# Patient Record
Sex: Male | Born: 1935 | Race: Black or African American | Hispanic: No | Marital: Married | Smoking: Never smoker
Health system: Southern US, Community
[De-identification: ages and names within clinical notes are randomized; demographics above are authoritative.]

## PROBLEM LIST (undated history)

## (undated) DIAGNOSIS — K635 Polyp of colon: Secondary | ICD-10-CM

## (undated) DIAGNOSIS — D649 Anemia, unspecified: Secondary | ICD-10-CM

## (undated) DIAGNOSIS — I471 Supraventricular tachycardia, unspecified: Secondary | ICD-10-CM

## (undated) DIAGNOSIS — R55 Syncope and collapse: Secondary | ICD-10-CM

## (undated) DIAGNOSIS — N2581 Secondary hyperparathyroidism of renal origin: Secondary | ICD-10-CM

## (undated) DIAGNOSIS — N289 Disorder of kidney and ureter, unspecified: Secondary | ICD-10-CM

## (undated) DIAGNOSIS — Z9889 Other specified postprocedural states: Secondary | ICD-10-CM

## (undated) DIAGNOSIS — I10 Essential (primary) hypertension: Secondary | ICD-10-CM

## (undated) DIAGNOSIS — Z8719 Personal history of other diseases of the digestive system: Secondary | ICD-10-CM

## (undated) DIAGNOSIS — R634 Abnormal weight loss: Secondary | ICD-10-CM

## (undated) DIAGNOSIS — R21 Rash and other nonspecific skin eruption: Secondary | ICD-10-CM

## (undated) DIAGNOSIS — M109 Gout, unspecified: Secondary | ICD-10-CM

## (undated) DIAGNOSIS — E7800 Pure hypercholesterolemia, unspecified: Secondary | ICD-10-CM

## (undated) DIAGNOSIS — R630 Anorexia: Secondary | ICD-10-CM

## (undated) HISTORY — DX: Rash and other nonspecific skin eruption: R21

## (undated) HISTORY — DX: Abnormal weight loss: R63.4

## (undated) HISTORY — DX: Anorexia: R63.0

## (undated) HISTORY — PX: OTHER SURGICAL HISTORY: SHX169

---

## 1997-07-31 ENCOUNTER — Ambulatory Visit (HOSPITAL_COMMUNITY): Admission: RE | Admit: 1997-07-31 | Discharge: 1997-07-31 | Payer: Self-pay | Admitting: Gastroenterology

## 1997-10-27 ENCOUNTER — Ambulatory Visit (HOSPITAL_COMMUNITY): Admission: RE | Admit: 1997-10-27 | Discharge: 1997-10-27 | Payer: Self-pay | Admitting: Family Medicine

## 1998-05-30 ENCOUNTER — Encounter: Admission: RE | Admit: 1998-05-30 | Discharge: 1998-08-28 | Payer: Self-pay | Admitting: Family Medicine

## 1998-08-23 ENCOUNTER — Encounter: Admission: RE | Admit: 1998-08-23 | Discharge: 1998-08-23 | Payer: Self-pay | Admitting: Family Medicine

## 1998-09-10 ENCOUNTER — Encounter: Admission: RE | Admit: 1998-09-10 | Discharge: 1998-09-10 | Payer: Self-pay | Admitting: Family Medicine

## 1998-11-12 ENCOUNTER — Encounter: Admission: RE | Admit: 1998-11-12 | Discharge: 1998-11-12 | Payer: Self-pay | Admitting: Family Medicine

## 1999-03-04 ENCOUNTER — Encounter: Admission: RE | Admit: 1999-03-04 | Discharge: 1999-03-04 | Payer: Self-pay | Admitting: Family Medicine

## 1999-05-27 ENCOUNTER — Encounter: Admission: RE | Admit: 1999-05-27 | Discharge: 1999-05-27 | Payer: Self-pay | Admitting: Family Medicine

## 1999-06-07 ENCOUNTER — Encounter: Admission: RE | Admit: 1999-06-07 | Discharge: 1999-06-07 | Payer: Self-pay | Admitting: *Deleted

## 1999-11-13 ENCOUNTER — Encounter: Admission: RE | Admit: 1999-11-13 | Discharge: 1999-11-13 | Payer: Self-pay | Admitting: Family Medicine

## 2000-07-14 ENCOUNTER — Encounter: Admission: RE | Admit: 2000-07-14 | Discharge: 2000-07-14 | Payer: Self-pay | Admitting: Family Medicine

## 2000-08-03 ENCOUNTER — Encounter: Admission: RE | Admit: 2000-08-03 | Discharge: 2000-08-03 | Payer: Self-pay | Admitting: Family Medicine

## 2000-08-04 ENCOUNTER — Encounter: Admission: RE | Admit: 2000-08-04 | Discharge: 2000-08-04 | Payer: Self-pay | Admitting: Sports Medicine

## 2000-09-02 ENCOUNTER — Encounter: Admission: RE | Admit: 2000-09-02 | Discharge: 2000-09-02 | Payer: Self-pay | Admitting: Family Medicine

## 2000-09-18 ENCOUNTER — Encounter: Admission: RE | Admit: 2000-09-18 | Discharge: 2000-09-18 | Payer: Self-pay | Admitting: Family Medicine

## 2000-11-13 ENCOUNTER — Encounter: Admission: RE | Admit: 2000-11-13 | Discharge: 2000-11-13 | Payer: Self-pay | Admitting: Family Medicine

## 2000-11-18 ENCOUNTER — Encounter: Admission: RE | Admit: 2000-11-18 | Discharge: 2000-11-18 | Payer: Self-pay | Admitting: Family Medicine

## 2001-01-28 ENCOUNTER — Encounter: Admission: RE | Admit: 2001-01-28 | Discharge: 2001-01-28 | Payer: Self-pay | Admitting: Family Medicine

## 2001-07-24 ENCOUNTER — Encounter: Payer: Self-pay | Admitting: Emergency Medicine

## 2001-07-24 ENCOUNTER — Emergency Department (HOSPITAL_COMMUNITY): Admission: EM | Admit: 2001-07-24 | Discharge: 2001-07-24 | Payer: Self-pay | Admitting: Emergency Medicine

## 2001-08-11 ENCOUNTER — Encounter: Admission: RE | Admit: 2001-08-11 | Discharge: 2001-08-11 | Payer: Self-pay | Admitting: Family Medicine

## 2001-10-28 ENCOUNTER — Encounter: Payer: Self-pay | Admitting: Vascular Surgery

## 2001-11-02 ENCOUNTER — Ambulatory Visit (HOSPITAL_COMMUNITY): Admission: RE | Admit: 2001-11-02 | Discharge: 2001-11-02 | Payer: Self-pay | Admitting: Vascular Surgery

## 2002-01-10 ENCOUNTER — Encounter: Admission: RE | Admit: 2002-01-10 | Discharge: 2002-01-10 | Payer: Self-pay | Admitting: Family Medicine

## 2002-04-15 ENCOUNTER — Encounter: Admission: RE | Admit: 2002-04-15 | Discharge: 2002-04-15 | Payer: Self-pay | Admitting: Family Medicine

## 2002-06-14 ENCOUNTER — Emergency Department (HOSPITAL_COMMUNITY): Admission: EM | Admit: 2002-06-14 | Discharge: 2002-06-14 | Payer: Self-pay

## 2002-06-28 ENCOUNTER — Encounter: Admission: RE | Admit: 2002-06-28 | Discharge: 2002-06-28 | Payer: Self-pay | Admitting: Family Medicine

## 2002-09-06 ENCOUNTER — Encounter: Admission: RE | Admit: 2002-09-06 | Discharge: 2002-09-06 | Payer: Self-pay | Admitting: Sports Medicine

## 2003-01-25 ENCOUNTER — Encounter: Admission: RE | Admit: 2003-01-25 | Discharge: 2003-01-25 | Payer: Self-pay | Admitting: Family Medicine

## 2003-06-22 ENCOUNTER — Encounter: Admission: RE | Admit: 2003-06-22 | Discharge: 2003-06-22 | Payer: Self-pay | Admitting: Family Medicine

## 2003-08-09 ENCOUNTER — Encounter: Admission: RE | Admit: 2003-08-09 | Discharge: 2003-08-09 | Payer: Self-pay | Admitting: Family Medicine

## 2003-08-10 ENCOUNTER — Encounter: Admission: RE | Admit: 2003-08-10 | Discharge: 2003-08-10 | Payer: Self-pay | Admitting: Family Medicine

## 2003-12-18 ENCOUNTER — Emergency Department (HOSPITAL_COMMUNITY): Admission: EM | Admit: 2003-12-18 | Discharge: 2003-12-18 | Payer: Self-pay | Admitting: Emergency Medicine

## 2003-12-21 ENCOUNTER — Ambulatory Visit: Payer: Self-pay | Admitting: Family Medicine

## 2004-02-16 ENCOUNTER — Ambulatory Visit: Payer: Self-pay | Admitting: Family Medicine

## 2004-05-14 ENCOUNTER — Ambulatory Visit: Payer: Self-pay | Admitting: Family Medicine

## 2004-09-17 ENCOUNTER — Ambulatory Visit: Payer: Self-pay | Admitting: Sports Medicine

## 2004-10-01 ENCOUNTER — Ambulatory Visit: Payer: Self-pay | Admitting: Sports Medicine

## 2005-01-10 ENCOUNTER — Ambulatory Visit: Payer: Self-pay | Admitting: Family Medicine

## 2005-06-11 ENCOUNTER — Ambulatory Visit: Payer: Self-pay | Admitting: Family Medicine

## 2005-06-27 ENCOUNTER — Ambulatory Visit: Payer: Self-pay | Admitting: Family Medicine

## 2005-08-29 ENCOUNTER — Emergency Department (HOSPITAL_COMMUNITY): Admission: EM | Admit: 2005-08-29 | Discharge: 2005-08-30 | Payer: Self-pay | Admitting: Emergency Medicine

## 2005-08-30 ENCOUNTER — Observation Stay (HOSPITAL_COMMUNITY): Admission: EM | Admit: 2005-08-30 | Discharge: 2005-08-31 | Payer: Self-pay | Admitting: Nephrology

## 2005-09-16 ENCOUNTER — Ambulatory Visit: Payer: Self-pay | Admitting: Sports Medicine

## 2006-02-02 ENCOUNTER — Encounter: Admission: RE | Admit: 2006-02-02 | Discharge: 2006-02-02 | Payer: Self-pay | Admitting: Nephrology

## 2006-02-11 ENCOUNTER — Ambulatory Visit (HOSPITAL_COMMUNITY): Admission: RE | Admit: 2006-02-11 | Discharge: 2006-02-11 | Payer: Self-pay | Admitting: Nephrology

## 2006-04-29 ENCOUNTER — Emergency Department (HOSPITAL_COMMUNITY): Admission: EM | Admit: 2006-04-29 | Discharge: 2006-04-29 | Payer: Self-pay | Admitting: Emergency Medicine

## 2006-06-04 DIAGNOSIS — M109 Gout, unspecified: Secondary | ICD-10-CM

## 2006-06-04 DIAGNOSIS — I471 Supraventricular tachycardia: Secondary | ICD-10-CM

## 2006-06-04 DIAGNOSIS — D126 Benign neoplasm of colon, unspecified: Secondary | ICD-10-CM

## 2006-06-04 DIAGNOSIS — I209 Angina pectoris, unspecified: Secondary | ICD-10-CM

## 2006-06-04 DIAGNOSIS — I10 Essential (primary) hypertension: Secondary | ICD-10-CM | POA: Insufficient documentation

## 2006-06-04 DIAGNOSIS — E7800 Pure hypercholesterolemia, unspecified: Secondary | ICD-10-CM | POA: Insufficient documentation

## 2006-06-05 ENCOUNTER — Emergency Department (HOSPITAL_COMMUNITY): Admission: EM | Admit: 2006-06-05 | Discharge: 2006-06-05 | Payer: Self-pay | Admitting: Emergency Medicine

## 2006-08-24 ENCOUNTER — Ambulatory Visit (HOSPITAL_COMMUNITY): Admission: RE | Admit: 2006-08-24 | Discharge: 2006-08-24 | Payer: Self-pay | Admitting: Nephrology

## 2006-08-26 ENCOUNTER — Ambulatory Visit (HOSPITAL_COMMUNITY): Admission: RE | Admit: 2006-08-26 | Discharge: 2006-08-26 | Payer: Self-pay | Admitting: Vascular Surgery

## 2006-08-26 ENCOUNTER — Ambulatory Visit: Payer: Self-pay | Admitting: Vascular Surgery

## 2006-08-31 ENCOUNTER — Ambulatory Visit (HOSPITAL_COMMUNITY): Admission: EM | Admit: 2006-08-31 | Discharge: 2006-08-31 | Payer: Self-pay | Admitting: Emergency Medicine

## 2006-09-10 ENCOUNTER — Ambulatory Visit (HOSPITAL_COMMUNITY): Admission: RE | Admit: 2006-09-10 | Discharge: 2006-09-10 | Payer: Self-pay | Admitting: Nephrology

## 2007-02-01 ENCOUNTER — Ambulatory Visit (HOSPITAL_COMMUNITY): Admission: RE | Admit: 2007-02-01 | Discharge: 2007-02-01 | Payer: Self-pay | Admitting: Vascular Surgery

## 2007-02-01 ENCOUNTER — Emergency Department (HOSPITAL_COMMUNITY): Admission: EM | Admit: 2007-02-01 | Discharge: 2007-02-02 | Payer: Self-pay | Admitting: Emergency Medicine

## 2007-02-01 ENCOUNTER — Ambulatory Visit: Payer: Self-pay | Admitting: Vascular Surgery

## 2007-06-03 ENCOUNTER — Ambulatory Visit: Payer: Self-pay | Admitting: Family Medicine

## 2007-06-12 ENCOUNTER — Emergency Department (HOSPITAL_COMMUNITY): Admission: EM | Admit: 2007-06-12 | Discharge: 2007-06-12 | Payer: Self-pay | Admitting: Emergency Medicine

## 2007-07-06 ENCOUNTER — Ambulatory Visit: Payer: Self-pay | Admitting: Vascular Surgery

## 2007-09-15 ENCOUNTER — Ambulatory Visit (HOSPITAL_COMMUNITY): Admission: RE | Admit: 2007-09-15 | Discharge: 2007-09-15 | Payer: Self-pay | Admitting: Nephrology

## 2007-10-28 ENCOUNTER — Encounter: Admission: RE | Admit: 2007-10-28 | Discharge: 2007-10-28 | Payer: Self-pay | Admitting: Nephrology

## 2007-11-04 ENCOUNTER — Ambulatory Visit: Payer: Self-pay | Admitting: Family Medicine

## 2007-11-18 ENCOUNTER — Ambulatory Visit: Payer: Self-pay | Admitting: Family Medicine

## 2007-12-02 ENCOUNTER — Encounter: Payer: Self-pay | Admitting: Family Medicine

## 2007-12-02 ENCOUNTER — Ambulatory Visit: Payer: Self-pay | Admitting: Family Medicine

## 2007-12-02 DIAGNOSIS — J449 Chronic obstructive pulmonary disease, unspecified: Secondary | ICD-10-CM

## 2007-12-02 DIAGNOSIS — J4489 Other specified chronic obstructive pulmonary disease: Secondary | ICD-10-CM | POA: Insufficient documentation

## 2007-12-02 LAB — CONVERTED CEMR LAB
ALT: 23 units/L (ref 0–53)
CO2: 28 meq/L (ref 19–32)
Creatinine, Ser: 9.93 mg/dL — ABNORMAL HIGH (ref 0.40–1.50)
Direct LDL: 96 mg/dL
Glucose, Bld: 91 mg/dL (ref 70–99)
LDL Cholesterol: 96 mg/dL
Total Bilirubin: 0.3 mg/dL (ref 0.3–1.2)

## 2007-12-06 ENCOUNTER — Encounter: Payer: Self-pay | Admitting: Family Medicine

## 2007-12-16 ENCOUNTER — Ambulatory Visit: Payer: Self-pay | Admitting: Family Medicine

## 2007-12-16 DIAGNOSIS — G547 Phantom limb syndrome without pain: Secondary | ICD-10-CM | POA: Insufficient documentation

## 2008-02-25 ENCOUNTER — Ambulatory Visit (HOSPITAL_COMMUNITY): Admission: RE | Admit: 2008-02-25 | Discharge: 2008-02-25 | Payer: Self-pay | Admitting: Nephrology

## 2008-09-28 ENCOUNTER — Ambulatory Visit (HOSPITAL_COMMUNITY): Admission: RE | Admit: 2008-09-28 | Discharge: 2008-09-28 | Payer: Self-pay | Admitting: Nephrology

## 2009-02-27 ENCOUNTER — Ambulatory Visit (HOSPITAL_COMMUNITY): Admission: RE | Admit: 2009-02-27 | Discharge: 2009-02-27 | Payer: Self-pay | Admitting: Nephrology

## 2009-05-23 ENCOUNTER — Telehealth: Payer: Self-pay | Admitting: Family Medicine

## 2009-07-26 ENCOUNTER — Ambulatory Visit (HOSPITAL_COMMUNITY): Admission: RE | Admit: 2009-07-26 | Discharge: 2009-07-26 | Payer: Self-pay | Admitting: Nephrology

## 2009-10-23 ENCOUNTER — Ambulatory Visit (HOSPITAL_COMMUNITY): Admission: RE | Admit: 2009-10-23 | Discharge: 2009-10-23 | Payer: Self-pay | Admitting: Nephrology

## 2009-10-26 ENCOUNTER — Emergency Department (HOSPITAL_COMMUNITY): Admission: EM | Admit: 2009-10-26 | Discharge: 2009-10-26 | Payer: Self-pay | Admitting: Emergency Medicine

## 2009-11-01 ENCOUNTER — Ambulatory Visit: Payer: Self-pay | Admitting: Family Medicine

## 2009-12-04 ENCOUNTER — Encounter: Payer: Self-pay | Admitting: Family Medicine

## 2010-01-14 ENCOUNTER — Encounter: Payer: Self-pay | Admitting: Family Medicine

## 2010-02-02 ENCOUNTER — Emergency Department (HOSPITAL_COMMUNITY): Admission: EM | Admit: 2010-02-02 | Discharge: 2010-02-02 | Payer: Self-pay | Admitting: Emergency Medicine

## 2010-02-05 ENCOUNTER — Encounter: Payer: Self-pay | Admitting: Family Medicine

## 2010-02-05 ENCOUNTER — Ambulatory Visit: Payer: Self-pay | Admitting: Family Medicine

## 2010-02-05 ENCOUNTER — Encounter: Payer: Self-pay | Admitting: *Deleted

## 2010-03-08 ENCOUNTER — Inpatient Hospital Stay (HOSPITAL_COMMUNITY)
Admission: EM | Admit: 2010-03-08 | Discharge: 2010-03-09 | Payer: Self-pay | Source: Home / Self Care | Admitting: Emergency Medicine

## 2010-03-09 ENCOUNTER — Encounter: Payer: Self-pay | Admitting: Family Medicine

## 2010-03-12 ENCOUNTER — Ambulatory Visit: Payer: Self-pay | Admitting: Family Medicine

## 2010-03-12 DIAGNOSIS — K222 Esophageal obstruction: Secondary | ICD-10-CM | POA: Insufficient documentation

## 2010-03-20 ENCOUNTER — Telehealth: Payer: Self-pay | Admitting: Family Medicine

## 2010-03-26 ENCOUNTER — Ambulatory Visit (HOSPITAL_COMMUNITY)
Admission: RE | Admit: 2010-03-26 | Discharge: 2010-03-26 | Payer: Self-pay | Source: Home / Self Care | Attending: Nephrology | Admitting: Nephrology

## 2010-03-28 ENCOUNTER — Encounter
Admission: RE | Admit: 2010-03-28 | Discharge: 2010-03-28 | Payer: Self-pay | Source: Home / Self Care | Attending: Gastroenterology | Admitting: Gastroenterology

## 2010-04-28 ENCOUNTER — Encounter: Payer: Self-pay | Admitting: Nephrology

## 2010-04-28 ENCOUNTER — Encounter: Payer: Self-pay | Admitting: Gastroenterology

## 2010-05-07 NOTE — Assessment & Plan Note (Signed)
Summary: f/u ED visit/eo   Vital Signs:  Patient profile:   75 year old male Weight:      137.4 pounds Temp:     98 degrees F oral Pulse rate:   73 / minute BP sitting:   204 / 104  Vitals Entered By: Arlyss Repress CMA, (March 12, 2010 10:50 AM) CC: HFU. HTN and throat pain Is Patient Diabetic? No Pain Assessment Patient in pain? no        Primary Care Provider:  Beza Steppe MD  CC:  HFU. HTN and throat pain.  History of Present Illness: 75 y/o M with ESRD, gout, HLD here for hosp f/u of elevated BP, chest pain and difficulty swallowing.  Pt was admitted from 03/22/2010 - 03/10/10  1)Chest pain: with T wave inversion on EKG.  Pt ruled out with 3 sets of cardiac enzymes and f/u EKG that returned to baseline.  T wave inversion was thought to be secondary to Hyperkalemia as pt's K was 5.6 on arrival to ER.  He was given kayexylate in ER.  Currently no chest pain, diaphoresis, dyspnea, or palpitations.    2)Elevated BP at arrival to ER, which responded to Labetalol.  BP was well controlled during hospitalization with home meds of Norvasc 10mg  qhs and Metoprolol xl 100mg  at bedtime, ranging from 160s-170s.  He continues to have worsened HTN today with BP 204/104.  He denies chest pain, syncope, dyspnea, diphoresis, nausea/vomting.   3)Dysphagia:  pt endorsed painful swallowing x several days prior to admission. He states dysphagia did not compromise breathing or swallowing, although he had pain with swallowing.  He has history of esophageal stricture s/p dilation in 1999.  Currently pt still endorses some painful swallowing.  He has been able to eat and drink.    4)ESRD: on Hemodialysis Mon, Wed, Fri at [facility] facility.  Although he was hyperkalemic to 5.6 on admission to ER on 03-23-23, Potassium returned to wnl after kayexylate.  Discharge K was 4.4.  Pt states that he tolerated HD well yesterday.  He states that Dr Deterding decreased his dry weight, but he does not know why.  He also states  that the potassium was adjusted during dialysis yesterday.      Habits & Providers  Alcohol-Tobacco-Diet     Tobacco Status: never  Current Medications (verified): 1)  Lipitor 40 Mg Tabs (Atorvastatin Calcium) .... 1/2 Tab By Mouth At Bedtime 2)  Colchicine 0.6 Mg Tabs (Colchicine) .Marland Kitchen.. 1 Tab By Mouth Daily 3)  Toprol Xl 100 Mg Xr24h-Tab (Metoprolol Succinate) .Marland Kitchen.. 1 Tab By Mouth Daily 4)  Amlodipine Besylate 10 Mg Tabs (Amlodipine Besylate) .Marland Kitchen.. 1 Tab By Mouth At Bedtime For Blood Pressure 5)  Dialyvite   Tabs (B Complex-C-Folic Acid) .Marland Kitchen.. 1 Tab By Mouth Daily 6)  Calcium Acetate 667 Mg Caps (Calcium Acetate (Phos Binder)) .Marland Kitchen.. 1 Cap Three Times A Day With Meals and 1 Cap With Snack 7)  Aspir-Low 81 Mg Tbec (Aspirin) .Marland Kitchen.. 1 Tab Daily  Allergies (verified): No Known Drug Allergies  Past History:  Past Surgical History: Last updated: 11/01/2009 AV fistula, L forearm - 03/24/2002 Colonoscopy - 07/06/1997 EGD - esophageal ring dilated - 07/06/1997 R arm amputation after machine accident at work-11/1995  Family History: Last updated: Mar 22, 2010 Dad died from heart dz., Mom alive w/ HTN.  Social History: Last updated: 02/05/2010 Lives with wife.  Has 4 children, 4 grandchildren.  One child lives with him and wife. On disability for R arm.  Previuosly heavy vodka drinker.  No tobacco or drugs.  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 Vitals reviewed.   Lungs:  CTA b/l  Heart:  Normal rate and regular rhythm. S1 and S2 normal without gallop, murmur, Extremities:  no edema    Neurologic:  alert & oriented X3.     Impression & Recommendations:  Problem # 1:  ESOPHAGEAL STRICTURE (ICD-530.3) Assessment Deteriorated Pt with history of esophageal stricture s/p esophageal dilation in 1999.  I would like to refer pt to GI for potential scoping.  He has an appt with Dr Matthias Hughs today at 4:15.    Orders: Gastroenterology Referral (GI) [facility]- Est Level  3 (95638)  Problem # 2:  HYPERTENSION, BENIGN SYSTEMIC (ICD-401.1) Assessment: Deteriorated BP 204/104 is much elevated and not at goal.  Pt is compliant with his medications.  He states that it was in the 140s in HD yesterday.  Because of his ESRD status, I would like to discuss hypertension management with pt's Nephrologist, Dr Deterding before making adjustments to his medications.  I spoke with Dr Deterding's nurse, Bonita Quin, regarding pt's current BP.  Bonita Quin advised that pt is scheduled to have HD tomorrow and Dr Deterding will round on pt tomorrow.  Will fax today's office visit note to Dr Deterding.  Discussed with pt that I will not make changes to meds until I discuss matter over with Dr Deterding first.  Pt is amendable to this plan.  Pt to continue on current meds for now.    His updated medication list for this problem includes:    Toprol Xl 100 Mg Xr24h-tab (Metoprolol succinate) .Marland Kitchen... 1 tab by mouth daily    Amlodipine Besylate 10 Mg Tabs (Amlodipine besylate) .Marland Kitchen... 1 tab by mouth at bedtime for blood pressure  Orders: [facility]- Est Level  3 ([id number])  Complete Medication List: 1)  Lipitor 40 Mg Tabs (Atorvastatin calcium) .... 1/2 tab by mouth at bedtime 2)  Colchicine 0.6 Mg Tabs (Colchicine) .Marland Kitchen.. 1 tab by mouth daily 3)  Toprol Xl 100 Mg Xr24h-tab (Metoprolol succinate) .Marland Kitchen.. 1 tab by mouth daily 4)  Amlodipine Besylate 10 Mg Tabs (Amlodipine besylate) .Marland Kitchen.. 1 tab by mouth at bedtime for blood pressure 5)  Dialyvite Tabs (B complex-c-folic acid) .Marland Kitchen.. 1 tab by mouth daily 6)  Calcium Acetate 667 Mg Caps (Calcium  acetate (phos binder)) .Marland Kitchen.. 1 cap three times a day with meals and 1 cap with snack 7)  Aspir-low 81 Mg Tbec (Aspirin) .Marland Kitchen.. 1 tab daily   Orders Added: 1)  Gastroenterology Referral [GI] 2)  [facility]- Est Level  3 [[id number]]

## 2010-05-07 NOTE — Miscellaneous (Signed)
Summary: ROI/TS  FAXED ROI TO DR.BUCCINI AND DR.DETERDING. Arlyss Repress CMA,  February 05, 2010 2:07 PM

## 2010-05-07 NOTE — Miscellaneous (Signed)
Summary: ROI  ROI   Imported By: De Nurse 02/13/2010 14:47:09  _____________________________________________________________________  External Attachment:    Type:   Image     Comment:   External Document

## 2010-05-07 NOTE — Assessment & Plan Note (Signed)
Summary: f/up,tcb   Vital Signs:  Patient profile:   75 year old male Height:      69 inches Weight:      139 pounds BMI:     20.60 Temp:     97.5 degrees F oral Pulse rate:   88 / minute BP sitting:   130 / 80  (right arm) Cuff size:   large  Vitals Entered By: Jimmy Footman, CMA (November 01, 2009 1:45 PM) CC: ER f/u confusion Is Patient Diabetic? Yes   Primary Care Provider:  Razan Siler MD  CC:  ER f/u confusion.  History of Present Illness: This is my first time meeting the patient and pt has not been seen at [facility] since 2009  75 y/o M is here for ER f/u of alter mental status.  Pt has ESRD on HD MWF.  Pt was seen in ER on 7/22 because wife was concerned that he was not acting like himself.  In the ER he had CXR, Head CT, CBC, Cmet, PT/INR that were all wnl.  He was sent home from ED.    Per pt and wife he was not able to get HD on Mon 7/18 because his fistula was clotted.  He was taken to OR on 7/19 and given general anesthesia for declotting of fistula.  After he was released from the OR pt retruned to dialysis center on tues for HD.  He then returned on Wed and Fri for HD.  Per pt's wife, on Tues following HD pt was not himself.  Pt did not know if he had gotten HD.  Asked wife when he was to go to HD next.  Wife states that pt's mental status returned to normal by Tues evening.    Pt and wife seems very unclear/unsure of the events that transpired that prompted the ER visit.  They state that pt was told to go to ER by Dialysis nurse.  Pt has since returned to all dialysis appts without incidents.  Pt currently feels fine.  Because I was unsure of the history of events I called the Dialysis Center on [address] and spoke to nurse there.  The charge nurse was not present during my call, but after speaking with the techs and other nurses there, it seems pt was prompted to go to ER on Fri because pt's wife called the HD Center on Wed with concerns that he acted strangely on Tues.  According to  staff at dialysis center they did not notice anything concerning or strange in pt's behavior when he was there for HD on T,W,F.      Habits & Providers  Alcohol-Tobacco-Diet     Tobacco Status: never  Current Medications (verified): 1)  Lipitor 80 Mg Tabs (Atorvastatin Calcium) .... 1/2 Tab By Mouth Daily 2)  Colchicine 0.6 Mg Tabs (Colchicine) .Marland Kitchen.. 1 Tab By Mouth Daily 3)  Toprol Xl 100 Mg Xr24h-Tab (Metoprolol Succinate) .Marland Kitchen.. 1 Tab By Mouth Daily 4)  Amlodipine Besylate 10 Mg Tabs (Amlodipine Besylate) .Marland Kitchen.. 1 Tab By Mouth At Bedtime For Blood Pressure 5)  Dialyvite   Tabs (B Complex-C-Folic Acid) .Marland Kitchen.. 1 Tab By Mouth Daily 6)  Calcium Acetate 667 Mg Caps (Calcium Acetate (Phos Binder)) .Marland Kitchen.. 1 Cap Three Times A Day With Meals and 1 Cap With Snack 7)  Aspir-Low 81 Mg Tbec (Aspirin) .Marland Kitchen.. 1 Tab Daily 8)  Lipitor 40 Mg Tabs (Atorvastatin Calcium) .Marland Kitchen.. 1 Tab By Mouth Daily  Allergies (verified): No Known Drug Allergies  Past History:  Family History: Last updated: 06-15-2006 Dad died from heart dz., Mom alive w/ HTN.  Social History: Last updated: 11/01/2009 Lives with wife.  Has 4 children, 4 grandchildren.  One child lives with him and wife. On disability for R arm.  Previuosly heavy vodka drinker.  No tobacco or drugs.  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  Has 4 children, 4 grandchildren.  One child lives with him and wife. On disability for R arm.  Previuosly heavy vodka drinker.  No tobacco or drugs.  Review of Systems General:  Denies chills, fatigue, fever, and loss of appetite. CV:   Denies chest pain or discomfort, difficulty breathing at night, fainting, fatigue, lightheadness, and near fainting. Resp:  Denies chest discomfort, chest pain with inspiration, and cough. GI:  Denies abdominal pain, constipation, diarrhea, nausea, and vomiting. Neuro:  Denies difficulty with concentration, disturbances in coordination, falling down, headaches, inability to speak, memory loss, numbness, tingling, tremors, and weakness.  Physical Exam  General:  Well-developed,well-nourished,in no acute distress; alert,appropriate and cooperative throughout examination. vitals reviewed.  Neck:  No deformities, masses, or tenderness noted. Lungs:  Normal respiratory effort, chest expands symmetrically. Lungs are clear to auscultation, no crackles or wheezes. Heart:  Normal rate and regular rhythm. S1 and S2 normal without gallop, murmur, click, rub or other extra sounds. Abdomen:  Bowel sounds positive,abdomen soft and non-tender without masses, organomegaly or hernias noted. Neurologic:  No cranial nerve deficits noted. Station and gait are normal. Plantar reflexes are down-going bilaterally. DTRs are symmetrical throughout. Sensory, motor and coordinative functions appear intact.alert & oriented X3.   Cervical Nodes:  No lymphadenopathy noted   Impression & Recommendations:  Problem # 1:  ALTERED MENTAL STATUS (ICD-780.97) Assessment Improved ER f/u f/u for AMS.  Neg CT head, CXR, CBC, Bmet.  Pt was not uremic, acidotic.  Currently neural exam is nonfocal.  I think that his symptoms may have been 2/2 coming off of anesthesia.  He has current labs at the kidney center, which I have requested.  Pt to f/u with me in 2-3 days. Will not make changes to his medications at this time.     Orders: [facility]- Est Level  3 ([id number])  Problem # 2:  COLON POLYP (ICD-211.3) Assessment: Comment Only Colon polyp seen on colonscopy in 2007.  Pt has not had repeat c-scope.  He needs to f/u with Dr Matthias Hughs.     Complete Medication List: 1)  Lipitor 80 Mg Tabs (Atorvastatin calcium) .... 1/2 tab by mouth daily 2)  Colchicine 0.6 Mg Tabs (Colchicine) .Marland Kitchen.. 1 tab by mouth daily 3)  Toprol Xl 100 Mg Xr24h-tab (Metoprolol succinate) .Marland Kitchen.. 1 tab by mouth daily 4)  Amlodipine Besylate 10 Mg Tabs (Amlodipine besylate) .Marland Kitchen.. 1 tab by mouth at bedtime for blood pressure 5)  Dialyvite Tabs (B complex-c-folic acid) .Marland Kitchen.. 1 tab by mouth daily 6)  Calcium Acetate 667 Mg Caps (Calcium acetate (phos binder)) .Marland Kitchen.. 1 cap three times a day with meals and 1 cap with snack 7)  Aspir-low 81 Mg Tbec (Aspirin) .Marland Kitchen.. 1 tab daily 8)  Lipitor 40 Mg Tabs (Atorvastatin calcium) .Marland Kitchen.. 1 tab by mouth daily  Other Orders: Future Orders: Lipid-[facility] ([id number]) ... 10/10/2011 Comp Met-[facility] ([id number]) ... 10/17/2010 CBC-[facility] ([id number]) ... 10/24/2010  Patient Instructions: 1)  Please schedule a follow-up appointment in 2-3 weeks with Dr Janalyn Harder.  2)  It was nice meeting you.  I will call you if there is anything concerning in the labs I see from Dr Darrick Penna. 3)  Schedule a colonoscopy/ sigmoidoscopy to help detect colon cancer with Dr Matthias Hughs:  [phone].  Appended Document: f/up,tcb Office visit of 11/01/2009 faxed to [facility]. Starleen Blue RN 11/02/2009

## 2010-05-07 NOTE — Assessment & Plan Note (Signed)
Summary: f/u ed,df   Vital Signs:  Patient profile:   75 year old male Weight:      139.1 pounds Pulse rate:   72 / minute BP sitting:   207 / 105 Cuff size:   large  Vitals Entered By: Arlyss Repress CMA, (February 05, 2010 10:06 AM) CC: f/up ED. BP left leg 237/121 regular cuff. re-checked with large cuff BP 207/105 Is Patient Diabetic? No Pain Assessment Patient in pain? no        Primary Care Provider:  Cat Ta MD  CC:  f/up ED. BP left leg 237/121 regular cuff. re-checked with large cuff BP 207/105.  History of Present Illness: 75 y/o M with ESRD on HD MWF is here for ED f/u of R knee pain.   R knee pain: Was seen in ER on 10/29 for acute knee pain.  Xrays done were negative.  Pt was given Rx for hydrocodone/acetaminophen.  He states pain is much better.  He is not taking anymore pain meds.  Denies falling, pain, swelling, locking, fever, chills, pain in other joints.   HYPERTENSION Disease Monitoring Blood pressure range: at HD center it usually runs 110s/60s, checked on left thigh  Medications: norvasc 10, metoprolol 100mg   Compliance: Lightheadedness: no Edema:no  Chest pain: no  Dyspnea:no  Prevention Exercise: none  Salt restriction:yes    Habits & Providers  Alcohol-Tobacco-Diet     Tobacco Status: never  Current Medications (verified): 1)  Lipitor 40 Mg Tabs (Atorvastatin Calcium) .... 1/2 Tab By Mouth At Bedtime 2)  Colchicine 0.6 Mg Tabs (Colchicine) .Marland Kitchen.. 1 Tab By Mouth Daily 3)  Toprol Xl 100 Mg Xr24h-Tab (Metoprolol Succinate) .Marland Kitchen.. 1 Tab By Mouth Daily 4)  Amlodipine Besylate 10 Mg Tabs (Amlodipine Besylate) .Marland Kitchen.. 1 Tab By Mouth At Bedtime For Blood Pressure 5)  Dialyvite   Tabs (B Complex-C-Folic Acid) .Marland Kitchen.. 1 Tab By Mouth Daily 6)  Calcium Acetate 667 Mg Caps (Calcium Acetate (Phos Binder)) .Marland Kitchen.. 1 Cap Three Times A Day With Meals and 1 Cap With Snack 7)  Aspir-Low 81 Mg Tbec (Aspirin) .Marland Kitchen.. 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  Social History: Last updated: 02/05/2010 Lives with wife.  Has 4 children, 4 grandchildren.  One child lives with him and wife. On disability for R arm.  Previuosly heavy vodka drinker.  No tobacco or drugs.  Code:  FULL CODE  Risk Factors: Smoking Status: never (02/05/2010)  Social History: Lives with wife.  Has 4 children, 4 grandchildren.  One child lives with him and wife. On disability for R arm.  Previuosly heavy vodka drinker.  No tobacco or drugs.  Code:  FULL CODE  Review of Systems       per hpi   Physical Exam  General:  Well-developed,well-nourished,in no acute distress; alert,appropriate and cooperative throughout examination. vitals reviewed.  Lungs:  Normal respiratory effort, chest expands symmetrically. Lungs are clear to auscultation, no crackles or wheezes. Heart:  Normal rate and regular rhythm. S1 and S2 normal without gallop, murmur, click, rub  or other extra sounds. Msk:  Knee: Normal to inspection with no erythema or effusion or obvious bony abnormalities. Palpation normal with no warmth or joint line tenderness or patellar tenderness or condyle tenderness. ROM normal in flexion and extension and lower leg rotation. Ligaments with solid consistent endpoints including ACL, PCL, LCL, MCL. Negative Mcmurray's and provocative meniscal tests. Non painful patellar compression. Patellar and quadriceps tendons unremarkable. Hamstring and quadriceps strength is  normal.   Extremities:  R arm amputation at shoulder Neurologic:  alert & oriented X3.     Impression & Recommendations:  Problem # 1:  HYPERTENSION, BENIGN SYSTEMIC (ICD-401.1) Assessment Deteriorated I do not believe today's BP reading.  Pt had normal BP at HD yesterday.  He is not volume overloaded on exam today nor is he dyspneic.  Will not make changes to his meds today.  However, I would like to contact Dr Deterding's office for recent labs.    His updated medication list for this problem includes:    Toprol Xl 100 Mg Xr24h-tab (Metoprolol succinate) .Marland Kitchen... 1 tab by mouth daily    Amlodipine Besylate 10 Mg Tabs (Amlodipine besylate) .Marland Kitchen... 1 tab by mouth at bedtime for blood pressure  Orders: [facility]- Est Level  3 ([id number])  Problem # 2:  KNEE PAIN, RIGHT (ICD-719.46) Assessment: Improved R knee pain much better.  He is no longer taking hydrocodone. Likely not gout from today's presentation.    His updated medication list for this problem includes:   Orders: [facility]- Est Level  3 ([id number])  Problem # 3:  Preventive Health Care (ICD-V70.0) Assessment: Comment Only  Pt had c-scope by Dr Marjorie Smolder 6 wks ago.  Pt signed ROI to get records of this.  Wife states that Dr Marjorie Smolder removed 3 polyps the size of rice grain.   Problem # 4:  HYPERCHOLESTEROLEMIA (ICD-272.0) Assessment: Comment Only Will need to get records from Dr Darrick Penna, who has been prescribing his meds.  There is a descrepency about his lipitor dose.  He states he takes 1/2 tab daily, which would be 20mg , but we have record that he is on 40mg  daily.     The following medications were removed from the medication list:    Lipitor 40 Mg Tabs (Atorvastatin calcium) .Marland Kitchen... 1 tab by mouth daily His updated medication list for this problem includes:    Lipitor 40 Mg Tabs (Atorvastatin calcium) .Marland Kitchen... 1/2 tab by mouth at bedtime  Complete Medication List: 1)  Lipitor 40 Mg Tabs (Atorvastatin calcium) .... 1/2 tab by mouth at  bedtime 2)  Colchicine 0.6 Mg Tabs (Colchicine) .Marland Kitchen.. 1 tab by mouth daily 3)  Toprol Xl 100 Mg Xr24h-tab (Metoprolol succinate) .Marland Kitchen.. 1 tab by mouth daily 4)  Amlodipine Besylate 10 Mg Tabs (Amlodipine besylate) .Marland Kitchen.. 1 tab by mouth at bedtime for blood pressure 5)  Dialyvite Tabs (B complex-c-folic acid) .Marland Kitchen.. 1 tab by mouth daily 6)  Calcium Acetate 667 Mg Caps (Calcium acetate (phos binder)) .Marland Kitchen.. 1 cap three times a day with meals and 1 cap with snack 7)  Aspir-low 81 Mg Tbec (Aspirin) .Marland Kitchen.. 1 tab daily  Patient Instructions: 1)  Please schedule a follow-up appointment in 3 months.  .    Orders Added: 1)  [facility]- Est Level  3 [[id number]]   Immunization History:  Influenza Immunization History:    Influenza:  historical (12/06/2009)   Immunization History:  Influenza Immunization History:    Influenza:  Historical (12/06/2009)    Prevention & Chronic Care Immunizations  Influenza vaccine: Historical  (12/06/2009)    Tetanus booster: 11/06/1998: Done.   Tetanus booster due: 11/05/2008    Pneumococcal vaccine: Done.  (08/05/2001)   Pneumococcal vaccine due: None    H. zoster vaccine: Not documented  Colorectal Screening   Hemoccult: Done.  (09/05/2004)   Hemoccult due: 09/05/2005    Colonoscopy: One benign 6mm rectal polyp, otherwise normal.  (08/21/2005)   Colonoscopy due: 08/22/2010  Other Screening   PSA: Not documented   PSA action/deferral: Discussed-PSA declined  (02/05/2010)   Smoking status: never  (02/05/2010)  Lipids   Total Cholesterol: Not documented   LDL: 96  (12/02/2007)   LDL Direct: 96  (12/02/2007)   HDL: Not documented   Triglycerides: Not documented    SGOT (AST): 19  (12/02/2007)   SGPT (ALT): 23  (12/02/2007)   Alkaline phosphatase: 54  (12/02/2007)   Total bilirubin: 0.3  (12/02/2007)    Lipid flowsheet reviewed?: Yes   Progress toward LDL goal: Unchanged  Hypertension   Last Blood Pressure: 207 / 105  (02/05/2010)   Serum  creatinine: 9.93  (12/02/2007)   Serum potassium 4.6  (12/02/2007)    Hypertension flowsheet reviewed?: Yes   Progress toward BP goal: Deteriorated  Self-Management Support :   Personal Goals (by the next clinic visit) :      Personal blood pressure goal: 130/80  (02/05/2010)     Personal LDL goal: 100  (02/05/2010)    Hypertension self-management support: Written self-care plan  (02/05/2010)   Hypertension self-care plan printed.    Lipid self-management support: Written self-care plan  (02/05/2010)   Lipid self-care plan printed.

## 2010-05-07 NOTE — Progress Notes (Signed)
Summary: Needs colonoscopy per Dr Matthias Hughs  Phone Note Outgoing Call   Call placed by: Kanyon Seibold MD,  May 23, 2009 5:16 PM Call placed to: Patient Summary of Call: Received letter from Dr Matthias Hughs [facility]) that pt regarding pt needing colonoscopy. Dr Matthias Hughs have not heard back from pt.  Pt has not been seen at [facility] since 12/2007.  I tried to call pt to inquire about this but was unable to reach pt.  Phone # we have in our system is disconnected.  NO other numbers found.   Initial call taken by: Mason Burleigh MD,  May 23, 2009 5:18 PM

## 2010-05-07 NOTE — Initial Assessments (Signed)
History of Present Illness: PCP:  Cat Ta MD  History of Present Illness:      Pt. is a 75 y/o aam presented to ER because of throat swelling and dysphagia.  He was found to have inverted T waves on EKG, HTN to 200/100, and potassium of 5.6. He is a dialysis patient with ESRD. He had dialysis today without issues. The patient does not appear fluid overloaded at this time. He has no vision changes or headache. Review of systems was negative.    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  Social History: Last updated: 02/05/2010 Lives with wife.  Has 4 children, 4 grandchildren.  One child lives with him and wife. On disability for R arm.  Previuosly heavy vodka drinker.  No tobacco or drugs.  Code:  FULL CODE  Risk Factors: Smoking Status: never (02/05/2010)   Problems Prior to Update: 1)  Hypertension, Benign Systemic  (ICD-401.1) 2)  Hypercholesterolemia  (ICD-272.0) 3)  Renal Dialysis Status  (ICD-V45.11) 4)  Chronic Kidney Disease Stage V  (ICD-585.5) 5)  Tachycardia, Paroxysmal Supraventricular  (ICD-427.0) 6)  Copd, Mild  (ICD-496) 7)  Phantom Limb Syndrome  (ICD-353.6) 8)  Gout, Unspecified  (ICD-274.9) 9)  Colon Polyp  (ICD-211.3) 10)  Angina Pectoris, Nos  (ICD-413.9)  Medications Prior to Update: 1)  Lipitor 40 Mg Tabs (Atorvastatin Calcium) .... 1/2 Tab By Mouth At Bedtime 2)  Colchicine 0.6 Mg Tabs (Colchicine) .Marland Kitchen.. 1 Tab By Mouth Daily 3)  Toprol Xl 100 Mg Xr24h-Tab (Metoprolol Succinate) .Marland Kitchen.. 1 Tab By Mouth  Daily 4)  Amlodipine Besylate 10 Mg Tabs (Amlodipine Besylate) .Marland Kitchen.. 1 Tab By Mouth At Bedtime For Blood Pressure 5)  Dialyvite   Tabs (B Complex-C-Folic Acid) .Marland Kitchen.. 1 Tab By Mouth Daily 6)  Calcium Acetate 667 Mg Caps (Calcium Acetate (Phos Binder)) .Marland Kitchen.. 1 Cap Three Times A Day With Meals and 1 Cap With Snack 7)  Aspir-Low 81 Mg Tbec (Aspirin) .Marland Kitchen.. 1 Tab Daily  Current Medications (verified): 1)  Lipitor 40 Mg Tabs (Atorvastatin Calcium) .... 1/2 Tab By Mouth At Bedtime 2)  Colchicine 0.6 Mg Tabs (Colchicine) .Marland Kitchen.. 1 Tab By Mouth Daily 3)  Toprol Xl 100 Mg Xr24h-Tab (Metoprolol Succinate) .Marland Kitchen.. 1 Tab By Mouth Daily 4)  Amlodipine Besylate 10 Mg Tabs (Amlodipine Besylate) .Marland Kitchen.. 1 Tab By Mouth At Bedtime For Blood Pressure 5)  Dialyvite   Tabs (B Complex-C-Folic Acid) .Marland Kitchen.. 1 Tab By Mouth Daily 6)  Calcium Acetate 667 Mg Caps (Calcium Acetate (Phos Binder)) .Marland Kitchen.. 1 Cap Three Times A Day With Meals and 1 Cap With Snack 7)  Aspir-Low 81 Mg Tbec (Aspirin) .Marland Kitchen.. 1 Tab Daily  Allergies (verified): No Known Drug Allergies  Directives: 1)  Full Code     Review of Systems       negative see HPI   Physical Exam  General:  Well-developed,well-nourished,in no acute distress; alert,appropriate and cooperative throughout examination Head:  Normocephalic and atraumatic without obvious abnormalities. No apparent alopecia or balding. Eyes:  No corneal or conjunctival inflammation noted. EOMI. Perrla. Funduscopic exam benign, without hemorrhages, exudates or  papilledema. Vision grossly normal. Mouth:  Oral mucosa and oropharynx without lesions or exudates.  Teeth in good repair. Neck:  No deformities, masses, or tenderness noted. Lungs:  Normal respiratory effort, chest expands symmetrically. Lungs are clear to auscultation, no crackles or wheezes. Heart:  Normal rate and regular rhythm. S1 and S2 normal without gallop, murmur, click, rub or other extra sounds. Abdomen:  Bowel sounds positive,abdomen  soft and non-tender without masses, organomegaly or hernias noted. Extremities:  No clubbing, cyanosis, edema, or deformity noted with normal full range of motion of all joints.   Right arm Amputation Neurologic:  No cranial nerve deficits noted. Station and gait are normal. Plantar reflexes are down-going bilaterally. DTRs are symmetrical throughout. Sensory, motor and coordinative functions appear intact. Psych:  Cognition and judgment appear intact. Alert and cooperative with normal attention span and concentration. No apparent delusions, illusions, hallucinations   Impression & Recommendations:  Problem # 1:  T wave Changes Assessment New Likely related to hyperkalemia 5.6 Has no chest pain at this time. Had dialysis today, next dialysis is Monday.  Given Kayexelate in the ER. serial CE EKG in am  Problem # 2:  Hypertensive urgency Patient had BP 200/100, responded well to IV labetalol. Will continue home medications and as needed labetalol. Has no headache or visual changes.  Problem # 3:  ESRD on dialysis Had HD today without issues. Will monitor overnight, but will get HD on Monday as scheduled.  Problem # 4:  PHANTOM LIMB SYNDROME (ICD-353.6) as needed pain medication percocet 5/325 mg tabs q 6 hours as needed pain  Problem # 5:  COPD, MILD (ICD-496) no SOB or wheezing, has no complaints at this time.  Problem # 6:  FEN-GI Renal diet subq Heparin  Problem # 7:  Disposition In house overnight to rule out cardiac event, to control blood pressure into safe range.  Complete Medication List: 1)  Lipitor 40 Mg Tabs (Atorvastatin calcium) .... 1/2 tab by mouth at bedtime 2)  Colchicine 0.6 Mg Tabs (Colchicine) .Marland Kitchen.. 1 tab by mouth daily 3)  Toprol Xl 100 Mg Xr24h-tab (Metoprolol succinate) .Marland Kitchen.. 1 tab by mouth daily 4)  Amlodipine Besylate 10 Mg Tabs (Amlodipine besylate) .Marland Kitchen.. 1 tab by mouth at bedtime for blood pressure 5)  Dialyvite Tabs (B complex-c-folic acid) .Marland Kitchen.. 1  tab by mouth daily 6)  Calcium Acetate 667 Mg Caps (Calcium acetate (phos binder)) .Marland Kitchen.. 1 cap three times a day with meals and 1 cap with snack 7)  Aspir-low 81 Mg Tbec (Aspirin) .Marland Kitchen.. 1 tab daily   Appended Document:  PROBLEM #8: dysphagia patient has a normal neck and throat on exam. His dysphagia does not prevent him from eating.  He has no allergies. will follow

## 2010-05-07 NOTE — Miscellaneous (Signed)
Summary: Changing stage CKD ESRD--> CKD stage 5  Clinical Lists Changes  Problems: Changed problem from ESRD (ICD-585.6) to CHRONIC KIDNEY DISEASE STAGE V (ICD-585.5) Added new problem of RENAL DIALYSIS STATUS (ICD-V45.11) Removed problem of RENAL INSUFFICIENCY, CHRONIC (ICD-586) Removed problem of COUGH (ICD-786.2)

## 2010-05-07 NOTE — Consult Note (Signed)
Summary: [facility]   Imported By: De Nurse 02/13/2010 14:46:40  _____________________________________________________________________  External Attachment:    Type:   Image     Comment:   External Document  Appended Document: [facility]   Colonoscopy  Procedure date:  12/04/2009  Findings:       Results: Polyp.  Results: Specimen sent for pathology.    Pathology:  Hyperplastic polyp.     Location:  [facility], Dr Marjorie Smolder.     Comments:      Repeat colonoscopy in 3 years.     Procedures Next Due Date:    Colonoscopy: 12/2012

## 2010-05-09 NOTE — Progress Notes (Signed)
Summary: Conversation with Renal   Phone Note Outgoing Call   Call placed by: Angeline Slim MD,  March 20, 2010 8:56 AM Call placed to: Specialist Summary of Call: Called Dr Deterding Re pt.  Pt has had elevated BPs in last two OV and also when he went to ER.  Spoke with Dr Darrick Penna and he states that he is reducing pt's dry weight to try to adjust his BP.  Pt does not tolerate too much adjustments because he becomes orthostatic.  Will continue to monitor.   Initial call taken by: Cat Ta MD,  March 20, 2010 8:59 AM

## 2010-06-17 ENCOUNTER — Encounter: Payer: Self-pay | Admitting: *Deleted

## 2010-06-17 LAB — DIFFERENTIAL
Lymphocytes Relative: 13 % (ref 12–46)
Lymphs Abs: 0.9 10*3/uL (ref 0.7–4.0)
Neutrophils Relative %: 65 % (ref 43–77)

## 2010-06-17 LAB — POCT CARDIAC MARKERS
Myoglobin, poc: >500 ng/mL (ref 12–200)
Troponin i, poc: <0.05 ng/mL (ref 0.00–0.09)

## 2010-06-17 LAB — RAPID STREP SCREEN (MED CTR MEBANE ONLY): Streptococcus, Group A Screen (Direct): NEGATIVE

## 2010-06-17 LAB — CBC
Hemoglobin: 11.6 g/dL — ABNORMAL LOW (ref 13.0–17.0)
MCH: 29.3 pg (ref 26.0–34.0)
MCV: 88.9 fL (ref 78.0–100.0)
Platelets: 154 10*3/uL (ref 150–400)
RBC: 3.96 MIL/uL — ABNORMAL LOW (ref 4.22–5.81)
RBC: 4.24 MIL/uL (ref 4.22–5.81)
WBC: 7.1 10*3/uL (ref 4.0–10.5)

## 2010-06-17 LAB — BASIC METABOLIC PANEL
Calcium: 9.1 mg/dL (ref 8.4–10.5)
Calcium: 9.5 mg/dL (ref 8.4–10.5)
Creatinine, Ser: 7.59 mg/dL — ABNORMAL HIGH (ref 0.4–1.5)
Creatinine, Ser: 8.9 mg/dL — ABNORMAL HIGH (ref 0.4–1.5)
GFR calc Af Amer: 7 mL/min — ABNORMAL LOW (ref >60)
GFR calc Af Amer: 9 mL/min — ABNORMAL LOW (ref >60)
GFR calc non Af Amer: 6 mL/min — ABNORMAL LOW (ref >60)
Glucose, Bld: 124 mg/dL — ABNORMAL HIGH (ref 70–99)
Sodium: 136 mEq/L (ref 135–145)

## 2010-06-17 LAB — CK TOTAL AND CKMB (NOT AT ARMC): Total CK: 137 U/L (ref 7–232)

## 2010-06-17 LAB — CARDIAC PANEL(CRET KIN+CKTOT+MB+TROPI)
CK, MB: 1.6 ng/mL (ref 0.3–4.0)
Relative Index: 1.2 (ref 0.0–2.5)
Troponin I: 0.05 ng/mL (ref 0.00–0.06)

## 2010-06-17 LAB — LIPID PANEL
Cholesterol: 149 mg/dL (ref 0–200)
HDL: 51 mg/dL (ref >39)
Total CHOL/HDL Ratio: 2.9 RATIO

## 2010-06-22 LAB — BASIC METABOLIC PANEL
BUN: 12 mg/dL (ref 6–23)
Calcium: 9.1 mg/dL (ref 8.4–10.5)
GFR calc non Af Amer: 10 mL/min — ABNORMAL LOW (ref >60)
Potassium: 3.7 mEq/L (ref 3.5–5.1)
Sodium: 139 mEq/L (ref 135–145)

## 2010-06-22 LAB — DIFFERENTIAL
Basophils Absolute: 0 10*3/uL (ref 0.0–0.1)
Lymphocytes Relative: 16 % (ref 12–46)
Monocytes Absolute: 0.6 10*3/uL (ref 0.1–1.0)
Neutro Abs: 2.3 10*3/uL (ref 1.7–7.7)
Neutrophils Relative %: 59 % (ref 43–77)

## 2010-06-22 LAB — CBC
HCT: 33.5 % — ABNORMAL LOW (ref 39.0–52.0)
RDW: 17.9 % — ABNORMAL HIGH (ref 11.5–15.5)
WBC: 3.9 10*3/uL — ABNORMAL LOW (ref 4.0–10.5)

## 2010-06-22 LAB — POTASSIUM: Potassium: 6 mEq/L — ABNORMAL HIGH (ref 3.5–5.1)

## 2010-06-22 LAB — PROTIME-INR
INR: 1.05 (ref 0.00–1.49)
Prothrombin Time: 13.6 seconds (ref 11.6–15.2)

## 2010-06-22 LAB — APTT: aPTT: 26 seconds (ref 24–37)

## 2010-07-09 ENCOUNTER — Encounter: Payer: Self-pay | Admitting: Home Health Services

## 2010-07-09 ENCOUNTER — Ambulatory Visit (INDEPENDENT_AMBULATORY_CARE_PROVIDER_SITE_OTHER): Payer: Medicare Other | Admitting: Family Medicine

## 2010-07-09 ENCOUNTER — Ambulatory Visit (HOSPITAL_COMMUNITY)
Admission: RE | Admit: 2010-07-09 | Discharge: 2010-07-09 | Disposition: A | Discharge status: Home / Self Care | Payer: Medicare Other | Source: Ambulatory Visit | Attending: Family Medicine | Admitting: Family Medicine

## 2010-07-09 VITALS — BP 209/105 | HR 77 | Temp 97.8°F | Resp 16 | Ht 69.0 in | Wt 133.0 lb

## 2010-07-09 DIAGNOSIS — R079 Chest pain, unspecified: Secondary | ICD-10-CM

## 2010-07-09 DIAGNOSIS — R0789 Other chest pain: Secondary | ICD-10-CM

## 2010-07-09 DIAGNOSIS — I10 Essential (primary) hypertension: Secondary | ICD-10-CM

## 2010-07-09 LAB — BASIC METABOLIC PANEL
CO2: 27 mEq/L (ref 19–32)
Calcium: 9.7 mg/dL (ref 8.4–10.5)
Creat: 9.26 mg/dL — ABNORMAL HIGH (ref 0.40–1.50)
Sodium: 143 mEq/L (ref 135–145)

## 2010-07-09 NOTE — Assessment & Plan Note (Signed)
Ayptical chest pain, esp based on initial symptoms starting with his head and moving down EKG reassuring compared to EKG in Dec. More concerning is his blood pressure control, which puts him at increased risk for Stroke, MI, especially in ESRD, who may present different in ACS. Will obtain Lytes, pt to have HD tomorrow. Given red flags about chest pain No pain meds given, he wanted a pain pill in case he had chest pain again- discussed importance of him being seen and not self-medicating

## 2010-07-09 NOTE — Progress Notes (Signed)
  Subjective:    Patient ID: Brad [name], male    DOB: 1935/05/08, 75 y.o.   MRN: [id number]  HPI  Sunday had chest pain (started after attending church), started as aching pain in head and moved to neck , bilat shoulders and chest. Pain was on and off but present during beginning of HD on Monday   No change in vision, no nausea, no vomiting, no sweating   Given Tylenol during dialysis which helped , M, W, F- Dr. Darrick Penna is the attending physician   He has been pain free since yesterday   Allergy symptoms for past few weeks with dry cough and sneezing, no allergy medication taken , now improved    BP- taking medication daily, BP falls after dialysis ( yesterday after HD systolic 160's)  then returns into 200's systolic    States  He feels well currently Review of Systems No dizziness, no loss of conciousness, no HA ,NO cp, NO sob     Objective:   Physical Exam   GEN- NAD, alert and oriented    CVS-RRR, no murmur, severely elevated BP    HEENT- Perrl, EOMI, no nystagmus, oropharynx clear, MMM    NECK- normal ROM, non tender to palaption over trapezius, no carotid bruit    RESP- CTAB    EXT- right upper ext amputee , graft-LUQ + thrill, LUE- good strength, sensation in tact, no pain with ROM      No edema       Assessment & Plan:

## 2010-07-09 NOTE — Patient Instructions (Signed)
Continue your current blood pressure medication at bedtime If you have chest pain or difficulty breathing then go to the ER Dr. Darrick Penna will see you tomorow

## 2010-07-09 NOTE — Assessment & Plan Note (Signed)
Severely elevated bp, I discussed this with his primary nephrologist as on previous visits and admissions has severely elevated BP Dr. Darrick Penna states he often drops low during HD and rebounds, he (patient) has in the past been reluctant to change him BP meds, therefore has been maintained on current regimen with known fluctuations in BP. He will see the patient tomorrow during his HD session Consider increasing Toprol

## 2010-07-10 ENCOUNTER — Telehealth: Payer: Self-pay | Admitting: *Deleted

## 2010-07-10 NOTE — Telephone Encounter (Signed)
Left message for patient to return call. Please let him know that his labs were normal.Busick, Rodena Medin

## 2010-07-10 NOTE — Telephone Encounter (Signed)
Message copied by Tessie Fass on Wed Jul 10, 2010  5:17 PM ------      Message from: Milinda Antis      Created: Wed Jul 10, 2010  1:38 PM       Normal Potassium.      Please let pt know that his labs were okay.

## 2010-07-16 NOTE — Telephone Encounter (Signed)
Patient never returned call.Brad [name]  

## 2010-07-19 ENCOUNTER — Other Ambulatory Visit (HOSPITAL_COMMUNITY): Payer: Self-pay | Admitting: Nephrology

## 2010-07-19 DIAGNOSIS — N186 End stage renal disease: Secondary | ICD-10-CM

## 2010-08-01 ENCOUNTER — Ambulatory Visit (HOSPITAL_COMMUNITY)
Admission: RE | Admit: 2010-08-01 | Discharge: 2010-08-01 | Disposition: A | Discharge status: Home / Self Care | Payer: Medicare Other | Source: Ambulatory Visit | Attending: Nephrology | Admitting: Nephrology

## 2010-08-01 ENCOUNTER — Other Ambulatory Visit (HOSPITAL_COMMUNITY): Payer: Self-pay | Admitting: Nephrology

## 2010-08-01 DIAGNOSIS — I871 Compression of vein: Secondary | ICD-10-CM | POA: Insufficient documentation

## 2010-08-01 DIAGNOSIS — N2581 Secondary hyperparathyroidism of renal origin: Secondary | ICD-10-CM | POA: Insufficient documentation

## 2010-08-01 DIAGNOSIS — I471 Supraventricular tachycardia, unspecified: Secondary | ICD-10-CM | POA: Insufficient documentation

## 2010-08-01 DIAGNOSIS — N186 End stage renal disease: Secondary | ICD-10-CM

## 2010-08-01 DIAGNOSIS — D649 Anemia, unspecified: Secondary | ICD-10-CM | POA: Insufficient documentation

## 2010-08-01 DIAGNOSIS — Y832 Surgical operation with anastomosis, bypass or graft as the cause of abnormal reaction of the patient, or of later complication, without mention of misadventure at the time of the procedure: Secondary | ICD-10-CM | POA: Insufficient documentation

## 2010-08-01 DIAGNOSIS — M109 Gout, unspecified: Secondary | ICD-10-CM | POA: Insufficient documentation

## 2010-08-01 DIAGNOSIS — T82898A Other specified complication of vascular prosthetic devices, implants and grafts, initial encounter: Secondary | ICD-10-CM | POA: Insufficient documentation

## 2010-08-01 DIAGNOSIS — I120 Hypertensive chronic kidney disease with stage 5 chronic kidney disease or end stage renal disease: Secondary | ICD-10-CM | POA: Insufficient documentation

## 2010-08-01 MED ORDER — IOHEXOL 300 MG/ML  SOLN
100.0000 mL | Freq: Once | INTRAMUSCULAR | Status: AC | PRN
  Administered 2010-08-01: 50 mL via INTRAVENOUS

## 2010-08-20 NOTE — Op Note (Signed)
NAME:  Brad [name], Brad [name] NO.:  [id number]   MEDICAL RECORD NO.:  [id number]          PATIENT TYPE:  INP   LOCATION:  2550                         FACILITY:  [facility]   PHYSICIAN:  Di Kindle. Edilia Bo, M.D.DATE OF BIRTH:  Mar 27, 1936   DATE OF PROCEDURE:  08/31/2006  DATE OF DISCHARGE:                               OPERATIVE REPORT   PREOPERATIVE DIAGNOSIS:  Clotted left forearm arteriovenous graft.   POSTOPERATIVE DIAGNOSIS:  Clotted left forearm arteriovenous graft.   PROCEDURE:  1. Thrombectomy of left forearm arteriovenous malformation graft with      insertion of new segment of graft to the more proximal basilic      vein.  2. Intraoperative fistulogram x 2.   SURGEON:  Di Kindle. Edilia Bo, M.D.   ASSISTANT:  Nurse.   ANESTHESIA:  Local with sedation.   TECHNIQUE:  The patient was taken to the operating room sedated by  anesthesia.  The left upper extremity was prepped and draped in the  usual sterile fashion.  After the skin was infiltrated with 1%  lidocaine, the incision of the venous anastomosis was opened and the  venous limb of the graft dissected free.  This was then anastomosed end-  to-side to the basilic vein at a branch point.  I elected to ligate the  2 distal branches and divide the graft and sew up higher on the basilic  vein in an end-to-end fashion to allow a smoother lie of the graft.  A  separate longitudinal incision was made over the venous limb of the  graft below the antecubital space, and the end of the graft was  dissected free.  A straight tunnel was then created between these 2  incisions to allow a nice lie of the graft.  Graft thrombectomy was  achieved using a No. 4 Fogarty catheter.  The arterial plug was  retrieved.  The graft was flushed with heparinized saline and clamped.  The patient had been heparinized.  The vein easily took a 5-mm dilator.  The new segment of graft, which was a 7-mm PTFE, was sewn end-to-end to  the old graft using continuous 6-0 Prolene suture.  This graft was then  spatulated, cut to the appropriate length, and anastomosed end-to-end to  the more proximal basilic vein using continuous 6-0 Prolene suture.  On  completion there was an excellent thrill in the graft.  Two  intraoperative fistulograms were obtained and no problems were  identified.  Hemostasis was obtained in the wounds.  The wounds were  closed with a deep layer of 3-0 Vicryl.  The skin was closed with 4-0  Vicryl.  A sterile dressing was applied.  The patient tolerated the  procedure well and was transferred to the recovery room in satisfactory  condition.  All needle and sponge counts were correct.      Di Kindle. Edilia Bo, M.D.  Electronically Signed    CSD/MEDQ  D:  08/31/2006  T:  08/31/2006  Job:  [id number]

## 2010-08-20 NOTE — Op Note (Signed)
NAME:  Brad [name], Brad [name] NO.:  [id number]   MEDICAL RECORD NO.:  [id number]          PATIENT TYPE:  AMB   LOCATION:  SDS                          FACILITY:  [facility]   PHYSICIAN:  Larina Earthly, M.D.    DATE OF BIRTH:  [birth year]   DATE OF PROCEDURE:  08/26/2006  DATE OF DISCHARGE:                               OPERATIVE REPORT   PREOPERATIVE DIAGNOSIS:  End-stage renal disease with occluded left AV  Gore-Tex graft.   POSTOPERATIVE DIAGNOSIS:  End-stage renal disease with occluded left AV  Gore-Tex graft.   PROCEDURE:  Thrombectomy and interposition jump graft revision to  basilic vein of left forearm loop AV Gore-Tex graft.   SURGEON:  Larina Earthly, M.D.   ASSISTANT:  Nurse.   ANESTHESIA:  MAC.   COMPLICATIONS:  None.   DISPOSITION:  To recovery room stable.   INDICATIONS FOR PROCEDURE:  The patient is 75 year old gentleman with  recurrent occlusion of his left AV Gore-Tex graft in his forearm.  He  had had thrombolysis and venous angioplasty 2 days prior with early  occlusion.  He is taken to the operating room at this time for  thrombectomy and revision.   PROCEDURE IN DETAIL:  The patient was taken to operating room and placed  in the supine position where the left arm was prepped and draped in  usual sterile fashion.  Using local anesthesia, incision made in the  antecubital space, carried down to isolate the venous anastomosis which  was end-to-end to the brachial vein.  Graft was opened transversely and  thrombectomized.  Arterial plug was removed, and good inflow was  encountered.  The brachial outflow vein was quite sclerotic by angio  from 2 days prior.  Incision was made to switch this to basilic outflow.  Separate incision was made in the above-elbow position.  The basilic  vein was of good caliber.  The vein was occluded proximally and  distally, was opened with a 11 blade and __________ Windle Guard scissors.  A 7-  mm Gore-Tex graft was brought  the field, spatulated and sewn end-to-side  to the vein with running 6-0 Prolene suture.  Graft was flushed with  heparinized and reoccluded.  The graft and tunneled down to the level of  the prior Gore-Tex graft.  The graft was cut to appropriate length and  was sewn end-to-end to the old graft with a running 6-0 Prolene suture.  Clamps were removed, and excellent thrill was noted.  The wound was  irrigated with saline.  Hemostasis with electrocautery.  Wounds were  closed with 3-0 Vicryl in the subcutaneous and subcuticular tissue.  Benzoin and Steri-Strips were applied.      Larina Earthly, M.D.  Electronically Signed     TFE/MEDQ  D:  08/26/2006  T:  08/26/2006  Job:  [id number]   cc:   Sanjuana Mae, MD  D. Oley Balm III, M.D.

## 2010-08-20 NOTE — Assessment & Plan Note (Signed)
OFFICE VISIT   Brad [name], Brad [name]  DOB:  [birth year]                                       07/06/2007  ZOXWR#:[id number]   HISTORY:  I saw the patient in the office today to evaluate him for  possible fistula.  This patient has undergone a previous amputation of  the right arm.  He has a functioning forearm graft on the left which he  tells me is working well.  He was sent over for cephalic vein mapping on  the left to consider him for fistula.   REVIEW OF SYSTEMS:  On review of systems he has had no recent chest  pain, chest pressure, palpitations or arrhythmias.  He has had no  bronchitis, asthma or wheezing.   PHYSICAL EXAMINATION:  General:  This is a pleasant 75 year old  gentleman who appears his stated age.  Vital signs:  Heart rate is 70,  saturation is 100%.  He has a palpable brachial and radial pulse on the  left side.  His forearm graft on the left has a good bruit and a good  thrill.  He has no significant left upper extremity swelling.  He has  had an amputation of the right arm.  He has palpable femoral pulses.  Both feet are warm and well perfused but I cannot palpable posterior  tibial pulses.  He has no significant lower extremity swelling.   His vein map in our office today shows very small cephalic vein on the  left and he does not appear to be a candidate for fistula.   I have explained that I would continue to use his forearm graft until  this fails and at that point he should have a new upper arm graft placed  on the left and it would require temporary catheter.  He would like to  consider having a thigh graft placed in the future rather than an upper  arm graft given that he has previously lost his right arm.  I do not  think this is unreasonable.  If he does elect to have a thigh graft in  the future he will need preoperative ABIs.  For now, however, I will  continue to use his forearm graft which appears to be functioning  well.  If it clots in the future and cannot be revised I would recommend an  upper arm graft unless he prefers to have a thigh graft placed.   Di Kindle. Edilia Bo, M.D.  Electronically Signed   CSD/MEDQ  D:  07/06/2007  T:  07/07/2007  Job:  [id number]   cc:   [facility]

## 2010-08-20 NOTE — Procedures (Signed)
CEPHALIC VEIN MAPPING   INDICATION:  Evaluation for dialysis graft access.   HISTORY:  ESRD.   EXAM:  The right cephalic vein not evaluated.   Diameter measurements range from   The left cephalic vein is compressible.  Not evaluated.   Diameter measurements range from 0.16 cm to 0.26 cm.   See attached worksheet for all measurements.   IMPRESSION:  Patent left cephalic vein which is not of acceptable  diameter for use as a dialysis access site.  The left cephalic vein was  imaged from the level of the ACS to the proximal arm.  The patient has  prosthetic right arm.   ___________________________________________  Di Kindle. Edilia Bo, M.D.   PB/MEDQ  D:  07/07/2007  T:  07/07/2007  Job:  [id number]

## 2010-08-20 NOTE — Op Note (Signed)
NAME:  Brad [name], MENEES NO.:  [id number]   MEDICAL RECORD NO.:  [id number]          PATIENT TYPE:  EMS   LOCATION:  MAJO                         FACILITY:  [facility]   PHYSICIAN:  Larina Earthly, M.D.    DATE OF BIRTH:  25-May-1935   DATE OF PROCEDURE:  02/01/2007  DATE OF DISCHARGE:  02/02/2007                               OPERATIVE REPORT   PREOPERATIVE DIAGNOSIS:  End-stage renal disease with occluded left  forearm loop arteriovenous Gore-Tex graft.   POSTOPERATIVE DIAGNOSIS:  End-stage renal disease with occluded left  forearm loop arteriovenous Gore-Tex graft.   PROCEDURE:  Thrombectomy, interposition jump graft revision of left  forearm loop AV Gore-Tex graft.   SURGEON:  Larina Earthly, M.D.   ASSISTANT:  Nurse.   ANESTHESIA:  MAC.   COMPLICATIONS:  None.   DISPOSITION:  To recovery room stable.   PROCEDURE IN DETAIL:  The patient was taken to the operating room,  placed supine position.  T he area of left arm was prepped and draped in  the usual sterile fashion.  An incision was made over the prior basilic  anastomosis.  The graft was opened transversely near the venous  anastomosis and there was a tight stenosis at the venous anastomosis.  The vein above this was of good caliber.  The vein was thrombectomized  and was flushed with heparinized saline and reoccluded.  The graft  itself  was thrombectomized.  The arterial plug was removed and  excellent flow was encountered.  The graft was flushed with heparinized  saline and reoccluded.  A new inner position graft of 7-mm Gore-Tex was  brought into the field.  The old venous anastomosis was excised.  The  vein was spatulated and the new graft was sewn into the vein with a  running 6-0 Prolene suture.  This was flushed with heparinized saline  and reoccluded.  Next, the graft was cut to the appropriate length and  sewn end-to-end to the old graft with a running 6-0 Prolene suture.  Clamps removed and  excellent  thrill was noted.  The wound was irrigated  with saline.  Hemostasis with electrocautery.  The wounds were closed  with 3-0 Vicryl in the subcutaneous tissue.  Benzoin and Steri-Strips  were applied.      Larina Earthly, M.D.  Electronically Signed     TFE/MEDQ  D:  02/01/2007  T:  02/02/2007  Job:  [id number]

## 2010-08-23 NOTE — Discharge Summary (Signed)
NAME:  Brad [name], RONAN NO.:  [id number]   MEDICAL RECORD NO.:  [id number]            PATIENT TYPE:   LOCATION:                                 FACILITY:   PHYSICIAN:  Maree Krabbe, M.D.     DATE OF BIRTH:   DATE OF ADMISSION:  08/30/2005  DATE OF DISCHARGE:  08/31/2005                                 DISCHARGE SUMMARY   ADMISSION DIAGNOSES:  1.  New right frontal infarct.  2.  Syncopal events.  3.  End-stage renal disease, chronic hemodialysis.  4.  Anemia of chronic disease.  5.  Secondary hyperparathyroidism.  6.  Hypotension.   DISCHARGE DIAGNOSES:  1.  Syncope thought to be related to orthostatic hypotension.  2.  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  3.  End-stage renal disease, chronic hemodialysis.  4.  Secondary hyperparathyroidism.  5.  Anemia of chronic disease.   BRIEF HISTORY/REASON FOR THE ADMISSION:  Mr. Coralyn Helling is a 75 year old  black male with end-stage renal disease on chronic hemodialysis, secondary  hypertension, atherosclerotic disease who started dialysis in April 2007 who  has had several syncopal episodes during the past week happening at home  when he changed positions from sitting to standing positions.  He comes in  today saying he had similar episodes and he presented to the ER for help.  He denied any headaches, visual changes.  Denied any chest pain,  palpitations or other types of events.  Denies nausea, vomiting or weakness.  Today in bed, he said he felt better than he was in the emergency room last  night when he was evaluated for this also.   PHYSICAL EXAMINATION:  Physical exam by Henri Medal renal PA in the ER  showed:  VITAL SIGNS:  Temperature of 97.6, pulse of 79, blood pressure was 92/55,  respirations 18.  GENERAL APPEARANCE:  Slender, pleasant black male in no acute distress.  HEENT:  Head was normocephalic, atraumatic.  Extraocular movements within  normal limits.  Pupils PERRLA.  Oropharynx with no  ulcerations noted.  NECK:  Supple.  No JVD.  Full range of motion appreciated.  No bruits  appreciated.  CARDIAC EXAM:  Regular rate and rhythm.  No murmur, no gallop or rub.  LUNGS:  Clear to auscultation bilaterally.  ABDOMEN:  Bowel sounds positive, within normal limits.  Abdomen is soft and  nontender.  EXTREMITIES:  Reveal left AV Gore-Tex graft, positive bruit, right arm upper  area is amputated but well healed.  No peripheral edema appreciated.  NEUROLOGIC:  Alerted and oriented x3.  Cranial nerves II-XII grossly intact.  Strength 5/5 upper and lower extremities.  SKIN:  Warm and dry.   LABORATORY DATA:  On admission showed sodium of 137, magnesium 2.9,  potassium 4.5, CO2 was 31, BUN 21, creatinine 7.2, glucose 79, albumin 3.8,  calcium 9.3, phosphorus 3.2.  Hemoglobin 14.  White count 6.8.  Cardiac  enzymes drawn x2 were negative.  CT scan of his head in the ER with an  emergency room note showing probable right frontoparietal event.  Unable to  obtain access from computer at time  of admission.  His EKG showed normal  sinus rhythm.  He was admitted by Dr. Arlean Hopping with syncope with probable new  right frontal infarct with an MRI scheduled.  As this was several hours out,  the stroke team was not involved.  No acute deficits at this time, just a  syncopal episode.  He was admitted, to continue aspirin.  Also placed on  telemetry to monitor for [facility] or other arrhythmias.  He was in normal sinus  rhythm the entire time he was in the ER.  Dr. Arlean Hopping held his metoprolol,  and his lisinopril was also placed on hold for systolic blood pressure less  than 120.   COURSE IN THE HOSPITAL:  1.  Syncope.  Second day in the hospital patient had no complaints.  No      dizziness when going to the bathroom.  No syncopal symptoms at all      reported when he stood from a laying down position ambulating in his      room.  An MRI of his head showed no acute CVA.  Old CVA in the       frontoparietal area was present and it was not acute.  It was discussed      with Dr. Delano Metz, he was cleared for discharge home.  Blood      pressure was 95/55 and no orthostatics were documented as being checked.      He continued to be in a normal sinus rhythm on telemetry.  No      significant arrhythmias appreciable.  Troponins were negative.  CK-MBs      were negative.  EKG is noted to show normal sinus rhythm with no acute      ST wave changes.  Noted on admission, he will only be on Toprol XL 50 mg      at bedtime and his other blood pressure medications will be stopped at      this time.  Lisinopril will be stopped at the current time and follow up      this at the dialysis unit.  His dry weight will be raised to 57.0      kilograms from 56.5 kilograms because of orthostasis and syncope.      Followup blood pressures and weights at the dialysis unit.  2.  Right frontoparietal CVA.  It was noted again this was not an acute      finding, but an old finding on MRI.  He will continue on aspirin 325 mg      q.d. for this and try to control his blood pressure, prevent further      significant drops or elevations.  3.  Anemia of chronic disease.  Hemoglobin noted to be 14.4 on admission.      His Epogen will be adjusted according to this.  InFeD will be continued      with a slow transfer saturation 13% noted on monthly labs.  4.  Secondary hyperparathyroidism.  His Hectorol was continued at 5 mcg IV      q. hemodialysis.  He is continued on his vitamin D.  Noted, he is not on      any phosphate by nurses at this time with his phosphorus controlled      without binders but will continue to follow closely calcium and      phosphorus in the dialysis unit.   DISCHARGE MEDICATIONS:  His meds at time of discharge are:  1.  Lipitor 10 mg q.d.  2.  Dialyavite one q.d.  3.  Aspirin 325 mg q.d.  4.  Toprol XL 50 mg q.h.s.  5.  Colchicine 0.6 mg q.d. p.r.n. for gouty flare up. 6.  InFeD  50 mg every week hemodialysis unit.  7.  Epogen 8000 units q. hemodialysis.  8.  Hectorol 0.5 mcg IV q. hemodialysis.   Continue dialysis on Monday, Wednesday, Friday schedule.      Donald Pore, P.A.      Maree Krabbe, M.D.  Electronically Signed    DWZ/MEDQ  D:  10/07/2005  T:  10/07/2005  Job:  [id number]

## 2010-08-23 NOTE — Op Note (Signed)
NAME:  Brad [name], Brad [name]                      ACCOUNT NO.:  [id number]   MEDICAL RECORD NO.:  [id number]                   PATIENT TYPE:  OIB   LOCATION:  2852                                 FACILITY:  [facility]   PHYSICIAN:  Di Kindle. Edilia Bo, M.D.        DATE OF BIRTH:  [birth year]   DATE OF PROCEDURE:  11/02/2001  DATE OF DISCHARGE:                                 OPERATIVE REPORT   PREOPERATIVE DIAGNOSIS:  Chronic renal failure.   POSTOPERATIVE DIAGNOSIS:  Same.   PROCEDURE:  Placement of new left forearm arteriovenous graft.   SURGEON:  Di Kindle. Edilia Bo, M.D.   ASSISTANT:  Myrlene Broker, P.A.   ANESTHESIA:  Local with sedation.   DESCRIPTION OF PROCEDURE:  The patient was taken to the operating room and  sedated by anesthesia.  The entire left upper extremity was prepped and  draped in the usual sterile fashion.  After the skin was infiltrated with 1%  lidocaine, a transverse incision was made at the antecubital level.  Here  the cephalic vein was sclerotic.  The basilic vein was also sclerotic.  Therefore, beneath the fascia the brachial artery and brachial vein were  dissected free.   The brachial vein was of good size with no significant disease.  The artery  had an excellent pulse.  A 4- to 7-mm tapered graft was then tunneled in a  loop fashion in the forearm using one distal counter incision.  The arterial  aspect of the graft was along the radial aspect of the forearm and the  venous aspect of the graft along the ulnar aspect of the forearm.  The  patient was heparinized with 6000 units of IV heparin.  The clamps were  placed proximally and distally on the brachial artery. A longitudinal  arteriotomy was made.   A short segment of the 4-mm end of the graft was excised. The graft  was  slightly spatulated and sewn  end-to-side to the artery using a continuous 6-  0 Prolene suture.  The graft was then pulled to the appropriate length for  anastomosis to  the brachial vein.  The vein was ligated distally and  spatulated proximally.  There was a valve present which was sharply excised.  The graft was cut to appropriate length, spatulated and sewn end-to-end to  the vein using continuous 6-0 Prolene suture.  At the completion there was  an excellent thrill in the graft.  Hemostasis was obtained in the wounds.  The wounds were closed with a deep layer of 3-0 Vicryl and the skin was  closed with 4-0 Vicryl.  Sterile dressing was applied.  The patient  tolerated the procedure well and was transferred to the recovery room in  satisfactory condition.  All needle and sponge counts were correct.  Di Kindle. Edilia Bo, M.D.   CSD/MEDQ  D:  11/02/2001  T:  11/04/2001  Job:  [id number]   cc:   Di Kindle. Edilia Bo, M.D.

## 2010-08-23 NOTE — H&P (Signed)
NAME:  ANDREN, BETHEA NO.:  [id number]   MEDICAL RECORD NO.:  [id number]          PATIENT TYPE:  INP   LOCATION:  4707                         FACILITY:  [facility]   PHYSICIAN:  Maree Krabbe, M.D.DATE OF BIRTH:  1936/02/09   DATE OF ADMISSION:  08/30/2005  DATE OF DISCHARGE:                                HISTORY & PHYSICAL   CHIEF COMPLAINT:  Recurrent syncope three times this week with finding of  CVA on head CT scan yesterday.   HISTORY OF PRESENT ILLNESS:  Mr. Docken is a pleasant 75 year old African  American male with end-stage renal disease secondary to hypertension and  atherosclerosis who started hemodialysis in early April 2007 where he  dialyzed Monday, Wednesday and Friday at the [facility].  He  has had several syncopal episodes this past week happening at home when he  is changing positions, going from a sitting to standing position.  He feels  them coming on and he said he also had similar episodes when he started  hemodialysis in early April.  He denies any headaches, visual changes.  No  palpitations, no chest pain with these events.  He has no nausea or vomiting  or weakness.  Today in the bed he says he feels better than he was in the  emergency room last night  when he was evaluated.   PAST MEDICAL HISTORY:  1.  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  2.  Iron-deficiency anemia secondary to hyperparathyroidism.  3.  Hyperlipidemia.  4.  Gout.  5.  History of colon polyps, status post recent colonoscopy.  He says his GI      doctor is Dr. Matthias Hughs but the chart says it is [facility].  6.  He also      has history of esophageal strictures, status post dilatation.  6.  History of SVT followed by Dr. [place].   CURRENT MEDICATIONS:  1.  Colchicine 0.6 mg b.i.d. has been decreased to daily p.r.n.  2.  Dialyvite daily.  3.  Lipitor 20 mg was just decreased yesterday to 10 mg per day.  4.  He is on 325  mg per day enteric-coated aspirin.  5.  PhosLo two tablets with meals three times a day was discontinued May 25      due to low phosphorus levels.  6.  Lisinopril was placed on hold May 25 due to chronic cough and problems      with blood pressure.  7.  He is on daily Metoprolol 100 mg at bedtime.  8.  Skelaxin 800 mg twice a day was added to his medicine list.  This has      been prescribed by another doctor and he had been taking it at home      p.r.n.   FAMILY HISTORY:  His brother, Molly Maduro, died recently.  He had been on  hemodialysis for 15 years followed by Dr. Bascom Levels.   SOCIAL HISTORY:  He is married for 40 years.  He is retired from [employer]  after 37 years.  He has four children alive and  well.  Has five  grandchildren.  He used to drink a lot, but has not since about 1990.  He  does not smoke.   REVIEW OF SYSTEMS:  As above plus no nausea or vomiting.  No fever or  chills.  No weakness or numbness or tingling of is left and or lower  extremities.  No dysphagia or difficulty swallowing.  No muscle or joint  pain.  No prolonged bleeding after dialysis or bruising.  No itching or  rashes.  No dizziness.   PHYSICAL EXAMINATION:  VITAL SIGNS:  Temperature 97.6, pulse 79, blood  pressure 92/55, respirations 18, weight 55.6 kg.  His post dialysis weight  yesterday was 56.3 having just been lowered to 56.5 due to some lower  extremity edema.  GENERAL:  Slender, alert, and pleasant gentleman, sitting up in bed,  conversant, in no acute distress.  HEENT:  Normocephalic, atraumatic.  EOMI.  PERRL.  Oropharynx:  No exudate.  NECK:  Supple. No JVD.  Full range of motion.  No bruits.  HEART:  Regular rate and rhythm without murmur, gallops or rubs.  LUNGS:  Clear to auscultation bilaterally.  ABDOMEN:  Soft, positive bowel sounds.  Nontender.  Nondistended.  EXTREMITIES:  Left lower arm AV Gore-Tex graft.  Positive thrill.  Right  upper arm amputated.  Well healed.  No lower  extremity edema.  NEUROLOGIC: Alert and oriented x3.  Cranial nerves II-XII intact. Strength  5/5 upper and lower extremities.  Skin warm and dry.   LABORATORY DATA:  Labs drawn today showed magnesium 2.9, sodium 137,  potassium 4.5, chloride 95, CO2 31, BUN 21, creatinine 7.2, glucose 79,  albumin 3.8, calcium 9.3, phosphorus  3.28.  Hemoglobin 14.4, white count  6.8, platelets 236,000.  He had cardiac enzymes drawn x2 which were  negative.  His monthly labs just drawn at the kidney center showed 13% iron  saturation and a hemoglobin of 14.1.  CT scan according to the ED note was  probable right frontal parietal.  I am unable to access this information on  the computer at this time.  EKG showed normal sinus rhythm.   ASSESSMENT/PLAN:  1.  New right frontal infarct for MRI today.  Continue aspirin.  2.  End-stage renal disease.  Continue Monday, Wednesday and Friday dialysis      schedule.  Current labs are okay.  3.  Syncope events, questionably related to cerebrovascular accident versus      blood pressure drop.  Monitor heart rhythm on telemetry due to history      of supraventricular tachycardia and possible cardiac component.  He is      in normal sinus rhythm now.  His symptoms are more related to      orthostatic blood pressure throughout.  Check chest x-ray to evaluate      fluid status.  Will hold p.m. dose Metoprolol if his blood pressure is      less than 120 systolic and also if we do get it, lower the dose to 50      mg.  He may need less pressure medicine at a lower rate.  Lisinopril is      on hold.  4.  Anemia.  Hemoglobin is 14.4.  I have decreased his prior Epogen dose      from 15,000 to 7500 and continued his NSAID at 15 mg per week.  5.  Secondary hyperparathyroidism, off binders for now.  Continue low dose  Hectorol.  6.  Hypotension.  As discussed in #3.      Weston Settle, P.A.      Maree Krabbe, M.D. Electronically Signed    MB/MEDQ  D:   08/30/2005  T:  08/30/2005  Job:  [id number]

## 2010-09-26 ENCOUNTER — Ambulatory Visit
Admission: RE | Admit: 2010-09-26 | Discharge: 2010-09-26 | Disposition: A | Discharge status: Home / Self Care | Payer: Medicare Other | Source: Ambulatory Visit | Attending: Family Medicine | Admitting: Family Medicine

## 2010-09-26 ENCOUNTER — Other Ambulatory Visit: Payer: Self-pay | Admitting: Family Medicine

## 2010-09-26 DIAGNOSIS — R05 Cough: Secondary | ICD-10-CM

## 2010-11-19 ENCOUNTER — Other Ambulatory Visit (HOSPITAL_COMMUNITY): Payer: Self-pay | Admitting: Nephrology

## 2010-11-19 DIAGNOSIS — N186 End stage renal disease: Secondary | ICD-10-CM

## 2010-11-26 ENCOUNTER — Other Ambulatory Visit (HOSPITAL_COMMUNITY): Payer: Self-pay | Admitting: Nephrology

## 2010-11-26 ENCOUNTER — Ambulatory Visit (HOSPITAL_COMMUNITY)
Admission: RE | Admit: 2010-11-26 | Discharge: 2010-11-26 | Disposition: A | Discharge status: Home / Self Care | Payer: Medicare Other | Source: Ambulatory Visit | Attending: Nephrology | Admitting: Nephrology

## 2010-11-26 DIAGNOSIS — N186 End stage renal disease: Secondary | ICD-10-CM | POA: Insufficient documentation

## 2010-11-26 DIAGNOSIS — Y832 Surgical operation with anastomosis, bypass or graft as the cause of abnormal reaction of the patient, or of later complication, without mention of misadventure at the time of the procedure: Secondary | ICD-10-CM | POA: Insufficient documentation

## 2010-11-26 DIAGNOSIS — T82898A Other specified complication of vascular prosthetic devices, implants and grafts, initial encounter: Secondary | ICD-10-CM | POA: Insufficient documentation

## 2010-11-26 MED ORDER — IOHEXOL 300 MG/ML  SOLN: 60.0000 mL | Freq: Once | INTRAMUSCULAR | Status: AC | PRN

## 2010-12-30 LAB — I-STAT 8, (EC8 V) (CONVERTED LAB)
BUN: 31 — ABNORMAL HIGH
Bicarbonate: 29.1 — ABNORMAL HIGH
Glucose, Bld: 86
HCT: 41
Hemoglobin: 13.9
Operator id: 294341
Potassium: 4.4
Sodium: 133 — ABNORMAL LOW
TCO2: 30

## 2010-12-30 LAB — CBC
HCT: 38.1 — ABNORMAL LOW
Hemoglobin: 12.9 — ABNORMAL LOW
RBC: 4.02 — ABNORMAL LOW
WBC: 4.9

## 2010-12-30 LAB — DIFFERENTIAL
Eosinophils Relative: 7 — ABNORMAL HIGH
Lymphocytes Relative: 26
Lymphs Abs: 1.3
Monocytes Absolute: 0.7
Monocytes Relative: 14 — ABNORMAL HIGH
Neutro Abs: 2.6

## 2011-01-08 LAB — POTASSIUM: Potassium: 4.7

## 2011-01-15 LAB — POCT I-STAT 4, (NA,K, GLUC, HGB,HCT)
Glucose, Bld: 87
HCT: 38 — ABNORMAL LOW
Operator id: 181601

## 2011-03-05 ENCOUNTER — Encounter: Payer: Self-pay | Admitting: Home Health Services

## 2011-03-20 ENCOUNTER — Other Ambulatory Visit (HOSPITAL_COMMUNITY): Payer: Self-pay | Admitting: Nephrology

## 2011-03-20 DIAGNOSIS — N186 End stage renal disease: Secondary | ICD-10-CM

## 2011-03-24 ENCOUNTER — Encounter (HOSPITAL_COMMUNITY): Payer: Self-pay | Admitting: Pharmacy Technician

## 2011-03-27 ENCOUNTER — Ambulatory Visit (HOSPITAL_COMMUNITY)
Admission: RE | Admit: 2011-03-27 | Discharge: 2011-03-27 | Disposition: A | Discharge status: Home / Self Care | Payer: Medicare Other | Source: Ambulatory Visit | Attending: Nephrology | Admitting: Nephrology

## 2011-03-27 ENCOUNTER — Other Ambulatory Visit (HOSPITAL_COMMUNITY): Payer: Self-pay | Admitting: Nephrology

## 2011-03-27 DIAGNOSIS — I871 Compression of vein: Secondary | ICD-10-CM | POA: Insufficient documentation

## 2011-03-27 DIAGNOSIS — N186 End stage renal disease: Secondary | ICD-10-CM

## 2011-03-27 DIAGNOSIS — Y832 Surgical operation with anastomosis, bypass or graft as the cause of abnormal reaction of the patient, or of later complication, without mention of misadventure at the time of the procedure: Secondary | ICD-10-CM | POA: Insufficient documentation

## 2011-03-27 DIAGNOSIS — T82898A Other specified complication of vascular prosthetic devices, implants and grafts, initial encounter: Secondary | ICD-10-CM | POA: Insufficient documentation

## 2011-03-27 MED ORDER — IOHEXOL 300 MG/ML  SOLN
100.0000 mL | Freq: Once | INTRAMUSCULAR | Status: AC | PRN
  Administered 2011-03-27: 50 mL via INTRAVENOUS

## 2011-03-27 NOTE — H&P (Signed)
Brad [name] is an 75 y.o. male.   Chief Complaint: Left arm graft stemosis; ESRD HPI: scheduled for L arm graft shuntogram and probable angioplasty/stent placement  No past medical history on file.  No past surgical history on file.  No family history on file. Social History:  does not have a smoking history on file. He does not have any smokeless tobacco history on file. His alcohol and drug histories not on file.  Allergies: No Known Allergies  Medications Prior to Admission  Medication Sig Dispense Refill  . amLODipine (NORVASC) 10 MG tablet Take 10 mg by mouth at bedtime. For blood pressure       . aspirin 81 MG EC tablet Take 81 mg by mouth daily.        Marland Kitchen atorvastatin (LIPITOR) 40 MG tablet 1/2 tab by mouth at bedtime       . B Complex-C-Folic Acid (DIALYVITE TABLET) TABS Take 1 tablet by mouth daily.        . calcium acetate (PHOSLO) 667 MG capsule Take 667 mg by mouth 3 (three) times daily with meals. And 1 cap with snack       . colchicine 0.6 MG tablet Take 0.6 mg by mouth daily.        . metoprolol (TOPROL-XL) 100 MG 24 hr tablet Take 100 mg by mouth daily.        Marland Kitchen omeprazole (PRILOSEC) 20 MG capsule Take 20 mg by mouth daily.         No current facility-administered medications on file as of 03/27/2011.    No results found for this or any previous visit (from the past 48 hour(s)). No results found.  ROS  There were no vitals taken for this visit. Physical Exam  Constitutional: He is oriented to person, place, and time. He appears well-developed and well-nourished.  HENT:  Head: Normocephalic.  Eyes: EOM are normal.  Neck: Normal range of motion.  Cardiovascular: Normal rate, regular rhythm and normal heart sounds.   Respiratory: Effort normal and breath sounds normal. He has no wheezes.  GI: Soft. Bowel sounds are normal.  Musculoskeletal: Normal range of motion.       Right arm amputee  Neurological: He is alert and oriented to person, place, and time.   Skin: Skin is warm and dry.     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 Scheduled for L arm graft pta/stent Pt aware of procedure benefits and risks and agreeable to proceed. Consent signed  Kandie Keiper A 03/27/2011, 2:45 PM

## 2011-03-27 NOTE — Procedures (Signed)
Technically successful fistulogram with angioplasty.  No immediate complications.   

## 2011-07-30 ENCOUNTER — Other Ambulatory Visit (HOSPITAL_COMMUNITY): Payer: Self-pay | Admitting: Nephrology

## 2011-07-30 DIAGNOSIS — N186 End stage renal disease: Secondary | ICD-10-CM

## 2011-07-31 ENCOUNTER — Other Ambulatory Visit (HOSPITAL_COMMUNITY): Payer: Self-pay | Admitting: Nephrology

## 2011-07-31 ENCOUNTER — Ambulatory Visit (HOSPITAL_COMMUNITY)
Admission: RE | Admit: 2011-07-31 | Discharge: 2011-07-31 | Disposition: A | Discharge status: Home / Self Care | Payer: Medicare Other | Source: Ambulatory Visit | Attending: Nephrology | Admitting: Nephrology

## 2011-07-31 DIAGNOSIS — T82898A Other specified complication of vascular prosthetic devices, implants and grafts, initial encounter: Secondary | ICD-10-CM | POA: Insufficient documentation

## 2011-07-31 DIAGNOSIS — Y849 Medical procedure, unspecified as the cause of abnormal reaction of the patient, or of later complication, without mention of misadventure at the time of the procedure: Secondary | ICD-10-CM | POA: Insufficient documentation

## 2011-07-31 DIAGNOSIS — N186 End stage renal disease: Secondary | ICD-10-CM

## 2011-07-31 MED ORDER — IOHEXOL 300 MG/ML  SOLN
100.0000 mL | Freq: Once | INTRAMUSCULAR | Status: AC | PRN
  Administered 2011-07-31: 55 mL via INTRAVENOUS

## 2011-07-31 NOTE — Procedures (Signed)
Shuntogram, venous PTA No complication No blood loss. See complete dictation in [facility] PACS.  

## 2011-07-31 NOTE — Discharge Instructions (Signed)
Kidney Failure, Child Kidneys play an important part in a child's growth and health. They:  Remove wastes and extra water from the blood.   Regulate blood pressure.   Balance chemicals like sodium and potassium.   Make a hormone that signals bone marrow to make red blood cells.   Make a hormone to help bones grow and keep them strong.  PROBLEMS SPECIFIC TO CHILDREN  Extreme fatigue.   Not able to focus.   Weak bones.   Nerve damage.   Depression.   Sleep problems.   Additional problems for children can include effects on their growth and development. Children may fall behind on the growth chart and in school.  Finding the best treatment for a child takes on special significance to ensure that the child with kidney failure can become an active, productive, well-adjusted adult. TREATMENT CHOICES FOR KIDNEY FAILURE IN CHILDREN  Children usually have a range of treatment options for kidney failure.   Many children begin with dialysis to stay healthy until a suitable kidney is available   In most cases, the goal is to have a successful transplant that allows your child to lead the most normal life possible. But kidneys are not always readily available, and not all children can have a transplant. Sometimes, a transplant itself may stop working, and the child may need to return to dialysis.  TRANSPLANTATION Once kidneys fail because of chronic kidney disease, function cannot be restored, so transplantation is the closest thing to a cure we have. Transplantation means that a healthy kidney from a donor is placed inside a child's body to take over the job of filtering wastes and extra fluid from the blood. A child with a transplant will still need to take medicines every day, follow a restricted diet, and get regular checkups to make sure the new kidney is accepted and functioning in the body. About half of the kidney transplants in children come from a living donor, usually a parent or  other close family member. DECEASED DONOR KIDNEYS  To receive a deceased donor kidney, your child will be placed on a waiting list. Every person who needs an organ from a deceased donor is registered with the [place] [employer] ([employer]), which maintains a centralized computer network linking all regional organ gathering organizations and transplant centers.   How long your child will have to wait for a transplant depends on many things but is determined primarily by how good the match is between your child and a donor. When a kidney becomes available, the hospital that has obtained the kidney reports to UN[employer], where the central computer generates a list of compatible recipients. Candidates' ages and length of time they have waited are factors in the point system. Children 18 and under get extra points compared with adults because they are likely to receive the greatest benefit from a donated kidney.   While your child is on the waiting list, notify the transplant center of any changes in health status, address, or phone number. The center will need to find you immediately when a kidney becomes available.  LIVING DONOR KIDNEYS  About half of the kidneys transplanted into children are donated by family members usually a parent or a family friend. Potential donors need to be tested for matching factors and to make sure that donating a kidney will not endanger their health. Most people can donate a kidney with little risk.   A kidney from a living donor often has advantages over  a kidney from a person who has just died.   A kidney from a parent is guaranteed to match on at least 3 of 6 proteins; mismatched proteins may cause rejection.   Living donation allows for greater preparation and for the operation to be scheduled.   A kidney from a living donor may be in better condition because it does not have to be transported from one site to another.  PREEMPTIVE TRANSPLANTATION Preemptive  transplantation means that the child receives a donated kidney before dialysis is needed. Some studies indicate that preemptive transplantation reduces the chances of rejecting the new kidney and improves the chances that it will function for a long time. Other studies show little or no survival advantage in preemptive transplants, although some families may feel that avoiding dialysis is an advantage in itself. KEEPING A HEALTHY KIDNEY Health professionals use the term "noncompliance" or "non-adherence" to describe a patient's failure or refusal to take prescribed medicines or follow a doctor's directions. Teenagers with transplanted organs are often noncompliant because the immunosuppressive drugs they must take change their appearance in unflattering ways. A child psychologist may be able to suggest techniques that reinforce desired behaviors. But communicating clearly about the reasons for treatment and the importance of following the regimen is an important part of helping all patients, including children. Children who understand that their decisions can affect their health are more likely to take responsibility for their actions. DIALYSIS The kidneys remove waste products and extra water from the blood. If the kidneys fail before transplantation is possible, your child may need some form of dialysis to do this job. Each type of dialysis will affect your family's lifestyle. Your caregiver will help you choose the one that is best for your child. Each situation is different. PERITONEAL DIALYSIS  Peritoneal dialysis uses the lining of your child's abdomen (peritoneal membrane) to filter blood. A mixture of minerals and sugar dissolved in water, called dialysis solution, is inserted into your child's abdomen through a soft tube. The sugar, called dextrose, draws wastes, chemicals, and extra water from the tiny blood vessels in the peritoneal membrane into the dialysis solution. After some time, the used  solution now loaded with the wastes and extra fluid that the kidneys would have filtered out is drained from your child's abdomen through the tube. The period that dialysis solution is in the abdomen is called the dwell time. The abdomen is filled again with fresh dialysis solution, and the cycle repeats. The process of emptying and refilling the abdomen is called an exchange and takes about 30 to 40 minutes.   Before the first treatment, a surgeon will place a small, soft tube called a catheter into your child's abdomen. The catheter tends to work better if the insertion site has time usually from 10 days to 3 weeks to heal. The catheter stays there to help transport dialysis solution to and from the abdomen and is removed only after a successful transplant is in place.   Peritoneal dialysis can be done with or without a cycling machine   Continuous ambulatory peritoneal dialysis (CAPD). CAPD requires no machine and can be done in any clean, well-lit place. With CAPD, your child's blood is always being cleaned. The dialysis solution passes from a plastic bag through the catheter and into the abdomen, where it stays for several hours with the catheter sealed. After the dwell time, the child drains the dialysis solution into a drain bag for disposal. Then the same catheter is used  to refill the abdomen with fresh solution so the cleaning process can begin again. With CAPD, the dialysis solution stays in the abdomen for 4 to 6 hours or more. Most people change the dialysis solution at least four times a day and sleep with solution in their abdomen at night. With CAPD, it is not necessary to perform an exchange during the night.   Continuous cycling peritoneal dialysis (CCPD). CCPD uses a machine called a cycler to fill and empty your child's abdomen many times at night during sleep. In the morning, the child begins one exchange with a dwell time that lasts the entire day. An additional exchange without the  cycler may be added in the middle of the afternoon to increase the amount of waste removed and to reduce the amount of fluid left behind.   Both types of peritoneal dialysis can be performed in the home without help from a nurse or doctor. If your child is very young, you will need to help with the exchanges or set up the cycler. Older children can do it themselves. You and your child will receive detailed instructions and extensive training so you feel confident when you perform the exchanges.   The most common problem with peritoneal dialysis is peritonitis, a serious abdominal infection that can occur if the opening where the catheter enters the body becomes infected or if contamination occurs as the catheter is connected or disconnected from the bags. Peritonitis requires antibiotic treatment prescribed by your child's nephrologist.   To avoid peritonitis, you must be careful to follow the correct procedures exactly and learn to recognize the early signs of fever, unusual color or cloudiness of the used fluid, and redness or pain around the catheter. Report these signs to your child's caregiver immediately so treatment for infection can begin promptly.  HEMODIALYSIS  In hemodialysis, your child's blood is sent through a filter to remove harmful wastes, extra salt, and extra water. Hemodialysis helps control blood pressure and keeps the proper balance of potassium, sodium, calcium, and bicarbonate.   Hemodialysis uses a special filter called a dialyzer. During treatment, blood travels from the child's body through tubes into the dialyzer, which filters out wastes and extra water. Then the cleaned blood flows through another set of tubes back into the child's body. The dialyzer is connected to a machine that monitors blood flow and disposes of the wastes.   Hemodialysis usually takes place in a clinic three times a week, but it may be required more often in smaller children. Each treatment lasts from  3 to 4 hours. Some clinics offer home hemodialysis, which allows more flexibility in scheduling but requires the caregiver to take weeks of training. During treatment, the child can do homework, read, write, sleep, talk, or watch TV.   If you choose hemodialysis for your child, the doctor will need to create an access to the bloodstream (vascular access) several months before the first treatment. The child may be able to complete the procedure for the vascular access in one day or your child may need to stay overnight in the hospital.   When a child starts hemodialysis, problems can be caused by rapid changes in the body's water and chemical balance during treatment. Muscle cramps and a sudden drop in blood pressure are two common side effects. Low blood pressure, called hypotension, can make a child feel weak, dizzy, or nauseated.   Most children need a few months to adjust to hemodialysis. Side effects can often be treated  quickly and easily, so you should always report them to your doctor and dialysis staff. You can avoid many side effects by making sure your child gets a proper diet, limits liquid intake, and takes all medicines as directed.  ROLE OF THE HEALTH CARE TEAM Because the treatments for kidney failure involve complicated procedures with a number of steps, many skilled professionals must work together to ensure that your child gets the best possible care. As a parent or guardian, you are the most important member of your child's team. You may need to speak for your child or ask questions when instructions are not clear. Knowing the roles of the different team members can help you ask the right questions and contribute to your child's care. Pediatrician  A pediatrician is a doctor who treats children. Your child's pediatrician is likely to be the first to recognize a kidney problem either during a routine physical exam or while treating an ailment. Depending on how well the kidneys are  working, the doctor may decide to monitor your child or advise you to see a specialist. (Your health insurance plan may require a written referral from the pediatrician in order for you to make an appointment with a specialist.) As your child's regular doctor, the pediatrician should talk with any specialists who become involved. A referral for consultation should optimally occur soon after chronic kidney disease is diagnosed, even if dialysis and transplantation are still a long way off.  Nephrologist  A nephrologist is a doctor who treats kidney diseases and kidney failure. If possible, your child should see a pediatric nephrologist because they are specifically trained to take care of kidney problems in children. In many areas of the country, pediatric nephrologists are in short supply, so you and your child may need to travel. If traveling is not possible, some nephrologists who treat adults can also treat children in consultation with a pediatric nephrologist.   The nephrologist may prescribe treatments to slow disease progression and will determine when referral to a transplant center or to a dialysis clinic is appropriate.  Dialysis Nurse  If your child needs dialysis, a nurse with special training will make sure all procedures are followed carefully. If you and your child choose peritoneal dialysis, the dialysis nurse will train you so you feel comfortable doing the exchanges at home. For hemodialysis in a clinic, the dialysis nurse will make sure that all needles are placed correctly and watch for any problems. The dialysis nurse can talk to you about the advantages and disadvantages of the different types of dialysis and explain the laboratory reports that indicate how well the treatments are working.  Transplant Coordinator  A coordinator at the transplantation center will be your main contact. He or she will schedule any required examinations and procedures and make sure your child's medical  information is complete and properly placed on the UNOS national waiting list. The transplant coordinator will make sure that every member of the child's health care team has all the necessary information and paperwork.  Social Worker  Every dialysis clinic and transplant center has a Child psychotherapist who can help you locate financial assistance and social services like transportation or family counseling and help with applications for Medicare. The social worker can tell you about support groups in your community and ways to reduce the stress that caring for a child with a chronic illness can cause.  Psychologist, Psychiatrist, or Counselor  Kidney disease can disrupt a child's life and create emotional turmoil.  A psychologist or counselor can help your child find ways to express emotions constructively. Adults and siblings may also find that counseling helps them with the conflicts and stresses they face. For example, medical bills can strain family finances. A parent or guardian may need to give up work to care for the child full-time. Siblings may feel resentment over the huge amount of attention given to their sibling and guilt over thinking bad thoughts about the sick child. Couples sometimes report increased tension in their marriage when a child is sick. A counselor can help families deal with conflicts that may arise, and social workers or financial counselors can help families meet the financial obligations that chronic illness creates.  Dietitian  When the kidneys stop working, wastes and excess fluid build up in the body and create chemical and hormonal imbalances. In children, however, these problems are especially troublesome because they can interfere with physical growth and mental development. Avoiding certain foods can help minimize the buildup of wastes and prevent chemical imbalance, but it can also lead to nutritional deficiencies. The buildup of wastes often makes children lose their  appetite, causing further nutritional problems. These complications are the reason your clinic's dietitian is so important.   Proper nutrition is extremely important for children with chronic kidney disease. Every dialysis clinic has a dietitian to help patients understand how the food they eat affects their health. The dietitian can help you develop meal plans that will fit your child's restricted diet and will talk with you about laboratory reports that may show nutritional deficiencies caused by your child's kidney disease. They may recommend special dietary supplements or formulas so that your child receives the best nutrition possible.   You can also ask your dietitian for recipes and titles of cookbooks for patients with kidney disease. Following the restrictions of a kidney disease diet might be hard at first, but with a little creativity, you can make tasty and satisfying meals. Reading Eat Right to Feel Right on Hemodialysis, a booklet from the N[employer]DK, can help you get started.  VACCINATIONS AND IMMUNOSUPPRESSION  The wastes and toxins that build up in the bloodstream of a child with kidney disease can weaken the immune system and make the child vulnerable to infections and the kinds of diseases that vaccines are designed to prevent. Children with kidney failure should receive the standard vaccinations recommended for all children, plus additional vaccinations for pneumonia and influenza.   Children who take immunosuppressive medication to treat an autoimmune disease or to prevent rejection of a transplanted kidney, however, should not receive vaccines containing live viruses, that is, the oral polio vaccine, the measles, mumps, and rubella (MMR) vaccine, or the varicella (chickenpox) vaccine. Children who are likely to need a transplant may benefit from early immunization with these vaccines before immunosuppressive drugs are needed.   The body's immune system protects against foreign substances  like bacteria and viruses that can cause disease. But the immune system also attacks transplanted organs, and the medicines that recipients must take to prevent rejection leave them vulnerable to infections. Children need relatively higher doses of immunosuppressive drugs than adults because their immune systems are more active. But these high doses can slow down growth and development. Over a long period of time, immunosuppression may lead to malignant growths. Immunosuppressive drugs can also have side effects such as weight gain, unusual hair growth, and acne. Children, especially teenagers, cite these side effects as the reason they do not take their pills, a problem that  contributes to the high rate of organ rejection in children.  MEDICAL COMPLICATIONS OF KIDNEY FAILURE The kidneys not only clean waste and extra fluid from the blood, they also help make red blood cells and balance nutrients needed for strong bones and growth. In addition, the kidneys may play a role in the metabolism of growth hormone (somatotropin). Chronic kidney disease can make children feel more tired, limit physical growth, and interfere with their ability to concentrate in school. ANEMIA Diseased kidneys do not make enough of a hormone called erythropoietin, or EPO, which stimulates the bone marrow to produce the red blood cells needed to carry oxygen to vital organs. Anemia is a shortage of red blood cells, and it is common in children with kidney disease. A child with anemia may tire easily and look pale. Anemia may also contribute to heart problems. A genetically engineered form of EPO injected under the skin one or more times a week can treat this form of anemia. BONE PROBLEMS AND GROWTH FAILURE  The kidneys help keep bones healthy by balancing phosphorus and calcium levels in the blood. When the kidneys stop working normally, phosphorus levels in the blood can become high and interfere with bone formation and normal growth.     Your child's caregiver may recommend dietary changes and food supplements to treat growth failure. Dietary changes may include limiting foods that contain large amounts of phosphorus, such as milk, cheese, cola, dried beans, peas, and nuts. Since avoiding all of these foods is impossible, caregivers will need to work with a dietitian to find a healthy way to limit the phosphorus in the child's diet while maintaining a desirable intake of the calories, protein, and other nutrients necessary to maintain growth and general health. In addition to dietary restrictions, most children will need to take specific medications called phosphate binders to lower their blood phosphorus levels.  FINANCIAL HELP FOR TREATMENT OF KIDNEY FAILURE No matter what treatment method your family chooses, medical expenses will be high. Fortunately, the [employer] and many other organizations offer programs to help with the cost of treatments. Role of the Social Worker Your child's dialysis or transplant center has a Child psychotherapist who can help you apply for Medicare and locate other sources of financial assistance.  ORGANIZATIONS Several groups offer information and services to patients with kidney disease. You may wish to contact one of the following: American Association of Kidney Patients: ResidentialShow.is American Society of Pediatric Nephrology: http://www.murphy.com/ National Kidney Foundation: www.kidney.org American Kidney Fund: FightingMatch.com.ee [facility]: www.lifeoptions.org and www.kidneyschool.org [employer]: www.unos.org Document Released: 05/05/2005 Document Revised: 03/13/2011 Document Reviewed: 07/02/2005 [facility] Patient Information 2012 [place], [place].

## 2011-11-21 ENCOUNTER — Other Ambulatory Visit (HOSPITAL_COMMUNITY): Payer: Self-pay | Admitting: Nephrology

## 2011-11-21 DIAGNOSIS — N186 End stage renal disease: Secondary | ICD-10-CM

## 2011-11-27 ENCOUNTER — Other Ambulatory Visit (HOSPITAL_COMMUNITY): Payer: Self-pay | Admitting: Nephrology

## 2011-11-27 ENCOUNTER — Ambulatory Visit (HOSPITAL_COMMUNITY)
Admission: RE | Admit: 2011-11-27 | Discharge: 2011-11-27 | Disposition: A | Discharge status: Home / Self Care | Payer: Medicare Other | Source: Ambulatory Visit | Attending: Nephrology | Admitting: Nephrology

## 2011-11-27 DIAGNOSIS — Y832 Surgical operation with anastomosis, bypass or graft as the cause of abnormal reaction of the patient, or of later complication, without mention of misadventure at the time of the procedure: Secondary | ICD-10-CM | POA: Insufficient documentation

## 2011-11-27 DIAGNOSIS — I871 Compression of vein: Secondary | ICD-10-CM | POA: Insufficient documentation

## 2011-11-27 DIAGNOSIS — N186 End stage renal disease: Secondary | ICD-10-CM

## 2011-11-27 DIAGNOSIS — T82898A Other specified complication of vascular prosthetic devices, implants and grafts, initial encounter: Secondary | ICD-10-CM | POA: Insufficient documentation

## 2011-11-27 MED ORDER — IOHEXOL 300 MG/ML  SOLN
100.0000 mL | Freq: Once | INTRAMUSCULAR | Status: AC | PRN
  Administered 2011-11-27: 50 mL via INTRAVENOUS

## 2011-11-27 NOTE — H&P (Signed)
Brad [name] is an 76 y.o. male.   Chief Complaint: decreased AVG flow rates at HD HPI: ESRD, here for shuntogram and possible intervention  No past medical history on file.  No past surgical history on file.  No family history on file. Social History:  does not have a smoking history on file. He does not have any smokeless tobacco history on file. His alcohol and drug histories not on file.  Allergies: No Known Allergies   (Not in a hospital admission)  No results found for this or any previous visit (from the past 48 hour(s)). No results found.  Review of Systems  Constitutional: Negative for fever and chills.  Respiratory: Negative for cough and shortness of breath.   Cardiovascular: Negative for chest pain.  Neurological: Negative for headaches.    There were no vitals taken for this visit. Physical Exam  Constitutional: He is oriented to person, place, and time. No distress.  Cardiovascular: Normal rate and regular rhythm.   No murmur heard. Respiratory: Effort normal and breath sounds normal. No respiratory distress.  GI: Soft. Bowel sounds are normal.  Neurological: He is alert and oriented to person, place, and time.  Psychiatric: He has a normal mood and affect.     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 Plan for repeat 7mm pta Consent obtained   Allisha Harter T. 11/27/2011, 1:56 PM

## 2011-11-27 NOTE — Procedures (Signed)
Successful RT AVG PTA No comp Stable Ready for use

## 2012-02-23 ENCOUNTER — Other Ambulatory Visit (HOSPITAL_COMMUNITY): Payer: Self-pay | Admitting: Nephrology

## 2012-02-23 DIAGNOSIS — N186 End stage renal disease: Secondary | ICD-10-CM

## 2012-02-24 ENCOUNTER — Other Ambulatory Visit (HOSPITAL_COMMUNITY): Payer: Self-pay | Admitting: Nephrology

## 2012-02-24 ENCOUNTER — Ambulatory Visit (HOSPITAL_COMMUNITY)
Admission: RE | Admit: 2012-02-24 | Discharge: 2012-02-24 | Disposition: A | Discharge status: Home / Self Care | Payer: Medicare Other | Source: Ambulatory Visit | Attending: Nephrology | Admitting: Nephrology

## 2012-02-24 DIAGNOSIS — M109 Gout, unspecified: Secondary | ICD-10-CM | POA: Insufficient documentation

## 2012-02-24 DIAGNOSIS — E785 Hyperlipidemia, unspecified: Secondary | ICD-10-CM | POA: Insufficient documentation

## 2012-02-24 DIAGNOSIS — N186 End stage renal disease: Secondary | ICD-10-CM

## 2012-02-24 DIAGNOSIS — Z8673 Personal history of transient ischemic attack (TIA), and cerebral infarction without residual deficits: Secondary | ICD-10-CM | POA: Insufficient documentation

## 2012-02-24 DIAGNOSIS — Y832 Surgical operation with anastomosis, bypass or graft as the cause of abnormal reaction of the patient, or of later complication, without mention of misadventure at the time of the procedure: Secondary | ICD-10-CM | POA: Insufficient documentation

## 2012-02-24 DIAGNOSIS — I871 Compression of vein: Secondary | ICD-10-CM | POA: Insufficient documentation

## 2012-02-24 DIAGNOSIS — Z992 Dependence on renal dialysis: Secondary | ICD-10-CM | POA: Insufficient documentation

## 2012-02-24 DIAGNOSIS — T82898A Other specified complication of vascular prosthetic devices, implants and grafts, initial encounter: Secondary | ICD-10-CM | POA: Insufficient documentation

## 2012-02-24 DIAGNOSIS — N2581 Secondary hyperparathyroidism of renal origin: Secondary | ICD-10-CM | POA: Insufficient documentation

## 2012-02-24 DIAGNOSIS — I120 Hypertensive chronic kidney disease with stage 5 chronic kidney disease or end stage renal disease: Secondary | ICD-10-CM | POA: Insufficient documentation

## 2012-02-24 MED ORDER — MIDAZOLAM HCL 2 MG/2ML IJ SOLN
INTRAMUSCULAR | Status: AC
  Filled 2012-02-24: qty 6

## 2012-02-24 MED ORDER — IOHEXOL 300 MG/ML  SOLN
100.0000 mL | Freq: Once | INTRAMUSCULAR | Status: AC | PRN
  Administered 2012-02-24: 70 mL via INTRAVENOUS

## 2012-02-24 MED ORDER — MIDAZOLAM HCL 2 MG/2ML IJ SOLN
INTRAMUSCULAR | Status: AC | PRN
  Administered 2012-02-24: 1 mg via INTRAVENOUS

## 2012-02-24 MED ORDER — FENTANYL CITRATE 0.05 MG/ML IJ SOLN
INTRAMUSCULAR | Status: AC
  Filled 2012-02-24: qty 4

## 2012-02-24 MED ORDER — FENTANYL CITRATE 0.05 MG/ML IJ SOLN
INTRAMUSCULAR | Status: AC | PRN
  Administered 2012-02-24: 50 ug via INTRAVENOUS

## 2012-02-24 NOTE — ED Notes (Signed)
Discharge instructions reviewed with pt and his sister, Corrine.  A copy of discharge instructions given to pt.  Pt states that he takes his BP meds at bedtime and that his bp always runs high.

## 2012-02-24 NOTE — ED Notes (Signed)
Pt taken to Corrine's vehicle and placed in passenger seat

## 2012-02-24 NOTE — Procedures (Signed)
Technically successful fistulogram with angioplasty.  No immediate complications.   

## 2012-02-24 NOTE — H&P (Signed)
Brad [name] is an 76 y.o. male.   Chief Complaint: decreased flow in left arm dialysis catheter HPI: Patient with ESRD and decreased flow rates via left arm AVGG secondary to venous stenosis presents today for angioplasty/possible stenting of stenosis or placement of new catheter if needed. PMH: ESRD,HTN, anemia, sec hyperparathyroidism, gout, hyperlipidemia, PSVT, esoph stricture, prior CVA PSH: rt arm traumatic amputation, vasc access procedures  No family history on file. Social History:  does not have a smoking history on file. He does not have any smokeless tobacco history on file. His alcohol and drug histories not on file.  Allergies: No Known Allergies  Current outpatient prescriptions:amLODipine (NORVASC) 10 MG tablet, Take 10 mg by mouth at bedtime. For blood pressure , Disp: , Rfl: ;  aspirin 81 MG EC tablet, Take 81 mg by mouth daily.  , Disp: , Rfl: ;  atorvastatin (LIPITOR) 40 MG tablet, 1/2 tab by mouth at bedtime , Disp: , Rfl: ;  B Complex-C-Folic Acid (DIALYVITE TABLET) TABS, Take 1 tablet by mouth daily.  , Disp: , Rfl:  calcium acetate (PHOSLO) 667 MG capsule, Take 667 mg by mouth 3 (three) times daily with meals. And 1 cap with snack , Disp: , Rfl: ;  colchicine 0.6 MG tablet, Take 0.6 mg by mouth daily.  , Disp: , Rfl: ;  metoprolol (TOPROL-XL) 100 MG 24 hr tablet, Take 100 mg by mouth daily.  , Disp: , Rfl: ;  omeprazole (PRILOSEC) 20 MG capsule, Take 20 mg by mouth daily.  , Disp: , Rfl:   No results found for this or any previous visit (from the past 48 hour(s)). No results found.  Review of Systems  Constitutional: Negative for fever and chills.  Respiratory: Negative for cough and shortness of breath.   Cardiovascular: Negative for chest pain.  Gastrointestinal: Negative for nausea, vomiting and abdominal pain.  Musculoskeletal: Negative for back pain.  Neurological: Negative for headaches.  Endo/Heme/Allergies: Does not bruise/bleed easily.    There were  no vitals taken for this visit. Physical Exam  Constitutional: He is oriented to person, place, and time. He appears well-developed and well-nourished.  Cardiovascular: Normal rate and regular rhythm.        Left arm AVGG with thrill/bruit  Respiratory: Effort normal.       Distant BS bilat  GI: Soft. Bowel sounds are normal.  Musculoskeletal: Normal range of motion. He exhibits no edema.  Neurological: He is alert and oriented to person, place, and time.     Assessment/Plan Pt with ESRD and venous stenosis of left arm AVGG. Plan is for angioplasty/possible stenting of venous stenosis today or placement of new catheter if needed. Details/risks of procedure d/w pt with his understanding and consent.  ALLRED,D KEVIN 02/24/2012, 1:51 PM

## 2012-02-25 ENCOUNTER — Telehealth (HOSPITAL_COMMUNITY): Payer: Self-pay | Admitting: *Deleted

## 2012-02-25 NOTE — Telephone Encounter (Signed)
Radiology post procedure phone call.  Pt not home right now but family reports that he is doing fine post fistulogram.

## 2012-06-10 ENCOUNTER — Other Ambulatory Visit (HOSPITAL_COMMUNITY): Payer: Self-pay | Admitting: Nephrology

## 2012-06-10 DIAGNOSIS — Z992 Dependence on renal dialysis: Secondary | ICD-10-CM

## 2012-06-17 ENCOUNTER — Ambulatory Visit (HOSPITAL_COMMUNITY)
Admission: RE | Admit: 2012-06-17 | Discharge: 2012-06-17 | Disposition: A | Discharge status: Home / Self Care | Payer: Medicare Other | Source: Ambulatory Visit | Attending: Nephrology | Admitting: Nephrology

## 2012-06-17 ENCOUNTER — Other Ambulatory Visit (HOSPITAL_COMMUNITY): Payer: Self-pay | Admitting: Nephrology

## 2012-06-17 VITALS — BP 147/79 | HR 59 | Resp 16

## 2012-06-17 DIAGNOSIS — Z992 Dependence on renal dialysis: Secondary | ICD-10-CM

## 2012-06-17 DIAGNOSIS — N186 End stage renal disease: Secondary | ICD-10-CM | POA: Insufficient documentation

## 2012-06-17 DIAGNOSIS — I871 Compression of vein: Secondary | ICD-10-CM | POA: Insufficient documentation

## 2012-06-17 DIAGNOSIS — T82898A Other specified complication of vascular prosthetic devices, implants and grafts, initial encounter: Secondary | ICD-10-CM | POA: Insufficient documentation

## 2012-06-17 DIAGNOSIS — Y832 Surgical operation with anastomosis, bypass or graft as the cause of abnormal reaction of the patient, or of later complication, without mention of misadventure at the time of the procedure: Secondary | ICD-10-CM | POA: Insufficient documentation

## 2012-06-17 MED ORDER — IOHEXOL 300 MG/ML  SOLN
100.0000 mL | Freq: Once | INTRAMUSCULAR | Status: AC | PRN
  Administered 2012-06-17: 55 mL via INTRAVENOUS

## 2012-06-17 MED ORDER — MIDAZOLAM HCL 2 MG/2ML IJ SOLN
INTRAMUSCULAR | Status: DC | PRN
  Administered 2012-06-17: 1 mg via INTRAVENOUS

## 2012-06-17 MED ORDER — FENTANYL CITRATE 0.05 MG/ML IJ SOLN
INTRAMUSCULAR | Status: DC | PRN
  Administered 2012-06-17: 50 ug via INTRAVENOUS

## 2012-06-17 MED ORDER — MIDAZOLAM HCL 2 MG/2ML IJ SOLN
INTRAMUSCULAR | Status: AC
  Filled 2012-06-17: qty 2

## 2012-06-17 MED ORDER — FENTANYL CITRATE 0.05 MG/ML IJ SOLN
INTRAMUSCULAR | Status: AC
  Filled 2012-06-17: qty 2

## 2012-06-17 NOTE — H&P (Signed)
Brad [name] is an 77 y.o. male.   Chief Complaint: low flows on HD  HPI: Recurrent venous stenosis L arm HD graft. Tolerated sedation previously.  No past medical history on file.  No past surgical history on file.  No family history on file. Social History:  has no tobacco, alcohol, and drug history on file.  Allergies: No Known Allergies   (Not in a hospital admission)  No results found for this or any previous visit (from the past 48 hour(s)). No results found.  ROS No CVA, MI, bleeding diathesis.  There were no vitals taken for this visit. Physical Exam   Assessment/Plan Venous PTA with sedation  Izyan Ezzell III,DAYNE Tenee Wish 06/17/2012, 8:07 AM

## 2012-12-15 ENCOUNTER — Encounter (HOSPITAL_COMMUNITY)
Admission: EM | Disposition: A | Discharge status: Home / Self Care | Payer: Self-pay | Source: Home / Self Care | Attending: Emergency Medicine

## 2012-12-15 ENCOUNTER — Encounter (HOSPITAL_COMMUNITY): Payer: Self-pay | Admitting: *Deleted

## 2012-12-15 ENCOUNTER — Emergency Department (HOSPITAL_COMMUNITY): Payer: Medicare Other | Admitting: *Deleted

## 2012-12-15 ENCOUNTER — Emergency Department (HOSPITAL_COMMUNITY)
Admission: EM | Admit: 2012-12-15 | Discharge: 2012-12-15 | Disposition: A | Discharge status: Home / Self Care | Payer: Medicare Other | Attending: Emergency Medicine | Admitting: Emergency Medicine

## 2012-12-15 ENCOUNTER — Encounter (HOSPITAL_COMMUNITY): Payer: Self-pay | Admitting: Emergency Medicine

## 2012-12-15 DIAGNOSIS — J449 Chronic obstructive pulmonary disease, unspecified: Secondary | ICD-10-CM | POA: Insufficient documentation

## 2012-12-15 DIAGNOSIS — D649 Anemia, unspecified: Secondary | ICD-10-CM | POA: Insufficient documentation

## 2012-12-15 DIAGNOSIS — J4489 Other specified chronic obstructive pulmonary disease: Secondary | ICD-10-CM | POA: Insufficient documentation

## 2012-12-15 DIAGNOSIS — T82898A Other specified complication of vascular prosthetic devices, implants and grafts, initial encounter: Secondary | ICD-10-CM

## 2012-12-15 DIAGNOSIS — N186 End stage renal disease: Secondary | ICD-10-CM

## 2012-12-15 DIAGNOSIS — N2581 Secondary hyperparathyroidism of renal origin: Secondary | ICD-10-CM | POA: Insufficient documentation

## 2012-12-15 DIAGNOSIS — Y832 Surgical operation with anastomosis, bypass or graft as the cause of abnormal reaction of the patient, or of later complication, without mention of misadventure at the time of the procedure: Secondary | ICD-10-CM | POA: Insufficient documentation

## 2012-12-15 DIAGNOSIS — N185 Chronic kidney disease, stage 5: Secondary | ICD-10-CM

## 2012-12-15 DIAGNOSIS — Z992 Dependence on renal dialysis: Secondary | ICD-10-CM | POA: Insufficient documentation

## 2012-12-15 DIAGNOSIS — Z79899 Other long term (current) drug therapy: Secondary | ICD-10-CM | POA: Insufficient documentation

## 2012-12-15 DIAGNOSIS — I120 Hypertensive chronic kidney disease with stage 5 chronic kidney disease or end stage renal disease: Secondary | ICD-10-CM | POA: Insufficient documentation

## 2012-12-15 DIAGNOSIS — I742 Embolism and thrombosis of arteries of the upper extremities: Secondary | ICD-10-CM | POA: Insufficient documentation

## 2012-12-15 HISTORY — DX: Hypocalcemia: E83.51

## 2012-12-15 HISTORY — DX: Personal history of other diseases of the digestive system: Z87.19

## 2012-12-15 HISTORY — DX: Polyp of colon: K63.5

## 2012-12-15 HISTORY — DX: Supraventricular tachycardia, unspecified: I47.10

## 2012-12-15 HISTORY — DX: Syncope and collapse: R55

## 2012-12-15 HISTORY — DX: Gout, unspecified: M10.9

## 2012-12-15 HISTORY — DX: Anemia, unspecified: D64.9

## 2012-12-15 HISTORY — PX: REVISION OF ARTERIOVENOUS GORETEX GRAFT: SHX6073

## 2012-12-15 HISTORY — DX: Secondary hyperparathyroidism of renal origin: N25.81

## 2012-12-15 HISTORY — DX: Disorder of kidney and ureter, unspecified: N28.9

## 2012-12-15 HISTORY — DX: Essential (primary) hypertension: I10

## 2012-12-15 HISTORY — DX: Other specified postprocedural states: Z98.890

## 2012-12-15 HISTORY — DX: Supraventricular tachycardia: I47.1

## 2012-12-15 HISTORY — DX: Pure hypercholesterolemia, unspecified: E78.00

## 2012-12-15 LAB — CBC
MCH: 30.8 pg (ref 26.0–34.0)
MCHC: 35.4 g/dL (ref 30.0–36.0)
MCV: 87.1 fL (ref 78.0–100.0)
Platelets: 220 10*3/uL (ref 150–400)
RBC: 3.57 MIL/uL — ABNORMAL LOW (ref 4.22–5.81)

## 2012-12-15 LAB — BASIC METABOLIC PANEL
BUN: 33 mg/dL — ABNORMAL HIGH (ref 6–23)
CO2: 26 mEq/L (ref 19–32)
Calcium: 9.7 mg/dL (ref 8.4–10.5)
Glucose, Bld: 87 mg/dL (ref 70–99)
Sodium: 134 mEq/L — ABNORMAL LOW (ref 135–145)

## 2012-12-15 LAB — PROTIME-INR: Prothrombin Time: 13.5 seconds (ref 11.6–15.2)

## 2012-12-15 SURGERY — REVISION OF ARTERIOVENOUS GORETEX GRAFT
Anesthesia: General | Site: Arm Lower | Laterality: Left | Wound class: Dirty or Infected

## 2012-12-15 MED ORDER — 0.9 % SODIUM CHLORIDE (POUR BTL) OPTIME
TOPICAL | Status: DC | PRN
  Administered 2012-12-15: 1000 mL

## 2012-12-15 MED ORDER — LIDOCAINE-EPINEPHRINE 1 %-1:100000 IJ SOLN
INTRAMUSCULAR | Status: AC
  Filled 2012-12-15: qty 1

## 2012-12-15 MED ORDER — MIDAZOLAM HCL 5 MG/5ML IJ SOLN
INTRAMUSCULAR | Status: DC | PRN
  Administered 2012-12-15: 1 mg via INTRAVENOUS

## 2012-12-15 MED ORDER — CEFAZOLIN SODIUM-DEXTROSE 2-3 GM-% IV SOLR
INTRAVENOUS | Status: AC
  Filled 2012-12-15: qty 50

## 2012-12-15 MED ORDER — SODIUM CHLORIDE 0.9 % IV SOLN
INTRAVENOUS | Status: DC
  Administered 2012-12-15: 10 mL/h via INTRAVENOUS

## 2012-12-15 MED ORDER — PHENYLEPHRINE HCL 10 MG/ML IJ SOLN
INTRAMUSCULAR | Status: DC | PRN
  Administered 2012-12-15: 120 ug via INTRAVENOUS
  Administered 2012-12-15 (×5): 40 ug via INTRAVENOUS
  Administered 2012-12-15: 80 ug via INTRAVENOUS

## 2012-12-15 MED ORDER — PROPOFOL 10 MG/ML IV BOLUS
INTRAVENOUS | Status: DC | PRN
  Administered 2012-12-15: 250 mg via INTRAVENOUS

## 2012-12-15 MED ORDER — FENTANYL CITRATE 0.05 MG/ML IJ SOLN
INTRAMUSCULAR | Status: DC | PRN
  Administered 2012-12-15 (×2): 25 ug via INTRAVENOUS

## 2012-12-15 MED ORDER — ONDANSETRON HCL 4 MG/2ML IJ SOLN
INTRAMUSCULAR | Status: DC | PRN
  Administered 2012-12-15: 4 mg via INTRAVENOUS

## 2012-12-15 MED ORDER — CEFAZOLIN SODIUM-DEXTROSE 2-3 GM-% IV SOLR
INTRAVENOUS | Status: DC | PRN
  Administered 2012-12-15: 2 g via INTRAVENOUS

## 2012-12-15 MED ORDER — FENTANYL CITRATE 0.05 MG/ML IJ SOLN: 25.0000 ug | INTRAMUSCULAR | Status: DC | PRN

## 2012-12-15 MED ORDER — LIDOCAINE HCL (CARDIAC) 20 MG/ML IV SOLN
INTRAVENOUS | Status: DC | PRN
  Administered 2012-12-15: 70 mg via INTRAVENOUS

## 2012-12-15 MED ORDER — SODIUM CHLORIDE 0.9 % IR SOLN
Status: DC | PRN
  Administered 2012-12-15: 14:00:00

## 2012-12-15 MED ORDER — LIDOCAINE-EPINEPHRINE 1 %-1:100000 IJ SOLN
INTRAMUSCULAR | Status: DC | PRN
  Administered 2012-12-15: 9 mL via INTRADERMAL

## 2012-12-15 MED ORDER — OXYCODONE HCL 5 MG PO TABS: 5.0000 mg | ORAL_TABLET | Freq: Four times a day (QID) | ORAL | Status: DC | PRN

## 2012-12-15 MED ORDER — EPHEDRINE SULFATE 50 MG/ML IJ SOLN
INTRAMUSCULAR | Status: DC | PRN
  Administered 2012-12-15: 10 mg via INTRAVENOUS
  Administered 2012-12-15: 5 mg via INTRAVENOUS
  Administered 2012-12-15: 10 mg via INTRAVENOUS

## 2012-12-15 MED ORDER — PROMETHAZINE HCL 25 MG/ML IJ SOLN: 6.2500 mg | INTRAMUSCULAR | Status: DC | PRN

## 2012-12-15 SURGICAL SUPPLY — 41 items
ADH SKN CLS APL DERMABOND .7 (GAUZE/BANDAGES/DRESSINGS) ×1
BAG DECANTER FOR FLEXI CONT (MISCELLANEOUS) ×1 IMPLANT
CANISTER SUCTION 2500CC (MISCELLANEOUS) ×2 IMPLANT
CATH EMB 5FR 80CM (CATHETERS) ×1 IMPLANT
CLIP TI MEDIUM 6 (CLIP) ×2 IMPLANT
CLIP TI WIDE RED SMALL 6 (CLIP) ×2 IMPLANT
CLOTH BEACON ORANGE TIMEOUT ST (SAFETY) ×2 IMPLANT
COVER SURGICAL LIGHT HANDLE (MISCELLANEOUS) ×2 IMPLANT
DECANTER SPIKE VIAL GLASS SM (MISCELLANEOUS) ×2 IMPLANT
DERMABOND ADVANCED (GAUZE/BANDAGES/DRESSINGS) ×1
DERMABOND ADVANCED .7 DNX12 (GAUZE/BANDAGES/DRESSINGS) ×1 IMPLANT
ELECT REM PT RETURN 9FT ADLT (ELECTROSURGICAL) ×2
ELECTRODE REM PT RTRN 9FT ADLT (ELECTROSURGICAL) ×1 IMPLANT
GEL ULTRASOUND 20GR AQUASONIC (MISCELLANEOUS) IMPLANT
GLOVE BIO SURGEON STRL SZ 6.5 (GLOVE) ×2 IMPLANT
GLOVE BIOGEL PI IND STRL 7.0 (GLOVE) IMPLANT
GLOVE BIOGEL PI INDICATOR 7.0 (GLOVE) ×1
GLOVE SS BIOGEL STRL SZ 7 (GLOVE) ×1 IMPLANT
GLOVE SUPERSENSE BIOGEL SZ 7 (GLOVE) ×1
GLOVE SURG SS PI 7.0 STRL IVOR (GLOVE) ×1 IMPLANT
GLOVE SURG SS PI 7.5 STRL IVOR (GLOVE) ×1 IMPLANT
GOWN PREVENTION PLUS XLARGE (GOWN DISPOSABLE) ×1 IMPLANT
GOWN STRL NON-REIN LRG LVL3 (GOWN DISPOSABLE) ×6 IMPLANT
GRAFT GORETEX STND 6X20 (Vascular Products) ×2 IMPLANT
GRAFT GORETEXSTD 6X20 (Vascular Products) IMPLANT
KIT BASIN OR (CUSTOM PROCEDURE TRAY) ×2 IMPLANT
KIT ROOM TURNOVER OR (KITS) ×2 IMPLANT
NDL HYPO 25GX1X1/2 BEV (NEEDLE) ×1 IMPLANT
NEEDLE HYPO 25GX1X1/2 BEV (NEEDLE) ×2 IMPLANT
NS IRRIG 1000ML POUR BTL (IV SOLUTION) ×2 IMPLANT
PACK CV ACCESS (CUSTOM PROCEDURE TRAY) ×2 IMPLANT
PAD ARMBOARD 7.5X6 YLW CONV (MISCELLANEOUS) ×4 IMPLANT
SPONGE GAUZE 4X4 12PLY (GAUZE/BANDAGES/DRESSINGS) ×2 IMPLANT
SUT PROLENE 6 0 BV (SUTURE) ×4 IMPLANT
SUT PROLENE 6 0 CC (SUTURE) ×2 IMPLANT
SUT VIC AB 3-0 SH 27 (SUTURE) ×4
SUT VIC AB 3-0 SH 27X BRD (SUTURE) ×2 IMPLANT
TOWEL OR 17X24 6PK STRL BLUE (TOWEL DISPOSABLE) ×2 IMPLANT
TOWEL OR 17X26 10 PK STRL BLUE (TOWEL DISPOSABLE) ×2 IMPLANT
UNDERPAD 30X30 INCONTINENT (UNDERPADS AND DIAPERS) ×2 IMPLANT
WATER STERILE IRR 1000ML POUR (IV SOLUTION) ×2 IMPLANT

## 2012-12-15 NOTE — ED Notes (Signed)
Suture cart and quick clot at bedside.

## 2012-12-15 NOTE — Op Note (Signed)
OPERATIVE REPORT  Date of Surgery: 12/15/2012  Surgeon: [name], MD  Assistant: Lorrine Kin  Pre-op Diagnosis: Bleeding from left forearm AV graft due to erosion secondary to ulceration on the arterial half of loop  Post-op Diagnosis: Same  Procedure: Procedure(s): #1 replacement of arterial half of left forearm AV graft using 6 mm Gore-Tex #2 thrombectomy of existing AV graft  Anesthesia: LMA  EBL: Minimal  Complications: None  Procedure Details: Patient was taken to the operating room placed in the supine position at which time satisfactory LMA general anesthesia was measured. Left upper extremity was prepped with Betadine scrub and solution draped in routine sterile manner. There is a functioning AV graft in the left forearm. There was a 1-1/2 cm eschar overlying the graft in the arterial half of the loop at the 4:00 position where the bleeding had occurred there was no active bleeding at this time. There was a weak pulse in the graft. 1% Xylocaine with epinephrine was also used to supplement the anesthesia. A short longitudinal incision was made near the arterial anastomosis graft dissected free proximally and distally encircled with vessel loop. Second incision was made at the apex of the lip 6:00 position. A towel was made peripheral to the old graft on the arterial half of the loop-radial-and a 6 mm Gore-Tex graft delivered through the tunnel. No heparin was used. The graft was clamped proximally and distally transected and both areas. Fogarty catheter was passed up the arterial and And flow reestablished. There is very little thrombus in the arterial end. Fogarty was then passed up the venous end which had no back bleeding it did traverse the venous anastomosis and go up into the central veins and a longitudinal cord fresh thrombus was retrieved followed by excellent backbleeding. This will pass the Fogarty yielded no further clot. The new graft was anastomosed in end to the  old graft in both locations using 6-0 Prolene. Following this the clamps released there was Pulse and thrill in the new graft. Hemostasis was achieved the wound was closed in layers with Vicryl in a subcuticular fashion covered temporarily with Tegaderm dressings. The old eschar overlying the old graft was then exposed by removing the Tegaderm dressing in this location. This area was excised using an elliptical incision with 15 blade including the underlying graft. There is no evidence of infection just erosion of the graft through the skin. This was completely excised the wound closed in layers of Vicryl in subcuticular fashion with Dermabond patient taken to recovery in stable condition   [name], MD 12/15/2012 2:21 PM

## 2012-12-15 NOTE — ED Provider Notes (Signed)
Brad [name] is a 77 y.o. male   Face-to-face evaluation   History:  spontaneous bleeding from left arm dialysis graft this morning, prior to dialysis. He, states the bleeding started after a small nodule ruptured. He had a evaluation of the graft several months ago, and is due for reevaluation of that, tomorrow; at the vascular clinic.  Physical exam: elderly alert, calm, cooperative . Left forearm with bandage on dialysis graft site, that has not saturated bandage with blood. Neurovascular intact distally in the left hand.  Assessment : Likely bleeding from puncture site, secondary to hematoma; now with bleeding controlled. We'll contact nephrology to determine the treatment course for  next dialysis and to arrange followup care.  Medical screening examination/treatment/procedure(s) were conducted as a shared visit with non-physician practitioner(s) and myself.  I personally evaluated the patient during the encounter  Flint Melter, MD 12/16/12 2206

## 2012-12-15 NOTE — Interval H&P Note (Signed)
History and Physical Interval Note:  12/15/2012 12:30 PM  Brad [name]  has presented today for surgery, with the diagnosis of bleeding arm graft  The various methods of treatment have been discussed with the patient and family. After consideration of risks, benefits and other options for treatment, the patient has consented to  Procedure(s): REVISION OF ARTERIOVENOUS GORETEX GRAFT (Left) as a surgical intervention .  The patient's history has been reviewed, patient examined, no change in status, stable for surgery.  I have reviewed the patient's chart and labs.  Questions were answered to the patient's satisfaction.     [name]

## 2012-12-15 NOTE — Anesthesia Postprocedure Evaluation (Signed)
  Anesthesia Post-op Note  Patient: Brad [name]  Procedure(s) Performed: Procedure(s): REVISION OF ARTERIOVENOUS GORETEX GRAFT using 6mm x 20cm Gortex graft (Left)  Patient Location: PACU  Anesthesia Type:General  Level of Consciousness: awake and alert   Airway and Oxygen Therapy: Patient Spontanous Breathing  Post-op Pain: none  Post-op Assessment: Post-op Vital signs reviewed  Post-op Vital Signs: stable  Complications: No apparent anesthesia complications

## 2012-12-15 NOTE — ED Notes (Signed)
Consent form signed and placed in pt's chart. Pt is undressed and in hospital gown. Jewelry removed and given to family.

## 2012-12-15 NOTE — Preoperative (Addendum)
Beta Blockers   Reason not to administer Beta Blockers:Not Applicable, takes Toprol at HS

## 2012-12-15 NOTE — ED Provider Notes (Signed)
CSN: [id number]     Arrival date & time 12/15/12  4098 History   None    Chief Complaint  Patient presents with  . Vascular Access Problem    HPI  Brad [name] is a 77 y.o. male with a PMH of renal failure on dialysis MWF, HTN, HLD, gout, anemia, paroxysmal SVT, hypocalcemia, secondary hyperparathyroidism, and syncope who presents to the ED for evaluation of a vascular access problem.  History was provided by the patient.  Patient states that he was sitting waiting for dialysis at 5:50 this morning at the [facility], when his left arm fistula spontaneously burst.  EMS arrived and applied a clamp, which has controlled the bleeding.  He states there was a scab with a band-aid from a previous access where he believes was the area of rupture.  He denies any injuries or aggravating factors to cause the bleed.  He currently is asymptomatic without any dizziness, lightheadedness, or pain.  He states there was a significant amount of blood loss, however, is unable to quantify.  He denies any prior redness, swelling, or drainage surrounding his fistula.  He otherwise has been well with no recent fevers, chills, change in appetite/activity, rhinorrhea, congestion, chest pain, SOB, abdominal pain, nausea, emesis, diarrhea, constipation, dysuria, or leg edema.  His nephrologist is Dr. Darrick Penna.  Patient last had dialysis on Monday.     Past Medical History  Diagnosis Date  . Hypertension   . High cholesterol   . Gout   . Anemia   . Renal disorder     End stage renal disease secondary to HTN nephrosclerosis  . Secondary hyperparathyroidism   . Hypocalcemia   . Colon polyps   . Paroxysmal supraventricular tachycardia   . Status post dilatation of esophageal stricture   . Syncope    Past Surgical History  Procedure Laterality Date  . Right arm amputation      No family history on file. History  Substance Use Topics  . Smoking status: Not on file  . Smokeless tobacco: Not on  file  . Alcohol Use: Not on file    Review of Systems  Constitutional: Negative for fever, chills, activity change, appetite change and fatigue.  HENT: Negative for ear pain, congestion, sore throat, rhinorrhea and neck pain.   Eyes: Negative for visual disturbance.  Respiratory: Negative for cough and shortness of breath.   Cardiovascular: Negative for chest pain.  Gastrointestinal: Negative for nausea, vomiting, abdominal pain, diarrhea and constipation.  Genitourinary: Negative for dysuria and hematuria.  Musculoskeletal: Negative for back pain.  Skin: Positive for wound. Negative for color change.  Neurological: Negative for dizziness, syncope, weakness, light-headedness, numbness and headaches.  Psychiatric/Behavioral: Negative for confusion.    Allergies  Review of patient's allergies indicates no known allergies.  Home Medications   Current Outpatient Rx  Name  Route  Sig  Dispense  Refill  . aspirin 81 MG EC tablet   Oral   Take 81 mg by mouth daily.           Marland Kitchen atorvastatin (LIPITOR) 40 MG tablet      1/2 tab by mouth at bedtime          . B Complex-C-Folic Acid (DIALYVITE TABLET) TABS   Oral   Take 1 tablet by mouth daily.           . calcium acetate (PHOSLO) 667 MG capsule   Oral   Take 667 mg by mouth 3 (three) times daily  with meals. And 1 cap with snack          . colchicine 0.6 MG tablet   Oral   Take 0.6 mg by mouth daily.           . hydrALAZINE (APRESOLINE) 50 MG tablet   Oral   Take 50 mg by mouth 2 (two) times daily.         . metoprolol (TOPROL-XL) 100 MG 24 hr tablet   Oral   Take 100 mg by mouth daily.           Marland Kitchen omeprazole (PRILOSEC) 20 MG capsule   Oral   Take 20 mg by mouth daily.           Marland Kitchen amLODipine (NORVASC) 10 MG tablet   Oral   Take 10 mg by mouth at bedtime. For blood pressure           BP 183/87  Pulse 69  Temp(Src) 97.7 F (36.5 C) (Oral)  Resp 12  SpO2 100%  Filed Vitals:   12/15/12 0643  12/15/12 0715 12/15/12 0730  BP: 183/87    Pulse: 69  66  Temp: 97.7 F (36.5 C)    TempSrc: Oral    Resp: 12 13 18   SpO2: 100%  100%     Physical Exam  Nursing note and vitals reviewed. Constitutional: He is oriented to person, place, and time. He appears well-developed and well-nourished. No distress.  HENT:  Head: Normocephalic and atraumatic.  Right Ear: External ear normal.  Left Ear: External ear normal.  Nose: Nose normal.  Mouth/Throat: Oropharynx is clear and moist. No oropharyngeal exudate.  Eyes: Conjunctivae are normal. Pupils are equal, round, and reactive to light. Right eye exhibits no discharge. Left eye exhibits no discharge.  Neck: Normal range of motion. Neck supple.  Cardiovascular: Normal rate, regular rhythm, normal heart sounds and intact distal pulses.  Exam reveals no gallop and no friction rub.   No murmur heard. Radial pulse present in the left arm.  Right arm amputation.    Pulmonary/Chest: Effort normal and breath sounds normal. No respiratory distress. He has no wheezes. He has no rales. He exhibits no tenderness.  Abdominal: Soft. Bowel sounds are normal. He exhibits no distension. There is no tenderness.  Musculoskeletal: Normal range of motion. He exhibits no edema and no tenderness.  Grip strength 5/5 on the left upper extremity  Neurological: He is alert and oriented to person, place, and time.  Gross sensation intact in the left extremity.    Skin: Skin is warm and dry. He is not diaphoretic.     Fistula graft present on the left forearm.  Clamp applied to lateral middle fistula.  Bleeding is well controlled.  No erythema to the forearm.      ED Course  Procedures (including critical care time) Labs Review Labs Reviewed - No data to display Imaging Review No results found.  Results for orders placed during the hospital encounter of 12/15/12  [facility]      Result Value Range   Prothrombin Time 13.5  11.6 - 15.2 seconds   INR 1.05   0.00 - 1.49  APTT      Result Value Range   aPTT 29  24 - 37 seconds  BASIC METABOLIC PANEL      Result Value Range   Sodium 134 (*) 135 - 145 mEq/L   Potassium 4.3  3.5 - 5.1 mEq/L   Chloride 91 (*) 96 - 112 mEq/L  CO2 26  19 - 32 mEq/L   Glucose, Bld 87  70 - 99 mg/dL   BUN 33 (*) 6 - 23 mg/dL   Creatinine, Ser 02.72 (*) 0.50 - 1.35 mg/dL   Calcium 9.7  8.4 - 53.6 mg/dL   GFR calc non Af Amer 4 (*) >90 mL/min   GFR calc Af Amer 5 (*) >90 mL/min  CBC      Result Value Range   WBC 5.4  4.0 - 10.5 K/uL   RBC 3.57 (*) 4.22 - 5.81 MIL/uL   Hemoglobin 11.0 (*) 13.0 - 17.0 g/dL   HCT 64.4 (*) 03.4 - 74.2 %   MCV 87.1  78.0 - 100.0 fL   MCH 30.8  26.0 - 34.0 pg   MCHC 35.4  30.0 - 36.0 g/dL   RDW 59.5  63.8 - 75.6 %   Platelets 220  150 - 400 K/uL     MDM   1. CHRONIC KIDNEY DISEASE STAGE V      Brad [name] is a 77 y.o. male with a PMH of renal failure on dialysis MWF, HTN, HLD, gout, anemia, paroxysmal SVT, hypocalcemia, secondary hyperparathyroidism, and syncope who presents to the ED for evaluation of a vascular access problem.    Rechecks  7:20 AM = QuickClot dressing applied using inflated blood pressure cuff.  Significant bleeding once clamp removed.   7:45 AM = Pressure continued to be applied by tech.  Patient resting in no acute distress.  8:42 AM = Pressure dressing removed and there is no active bleeding.  Small 1 cm x 1 cm scab present.     Consults  9:10 AM = Spoke with Dr. Darrick Penna who would like the fistula evaluated and then the patient will go to HD afterwards as scheduled.     9:14 AM = Dr. Hart Rochester with vascular surgery will come to evaluate the patient.   9:20 AM = Dr. Hart Rochester is taking patient to OR for vascular repair.     Patient was evaluated in the ED for a wound to his vascular graft.  Bleeding was well controlled by QuickClot dressing and direct pressure. Patient was evaluated in the ED by vascular surgery who is taking him to the OR for  surgical repair of his vascular wound.  Patient remained in no acute distress throughout his ED visit.  He was in agreement with admission and plan.     Final impressions: 1. Vascular graft site bleeding, controlled    Luiz Iron PA-C   This patient was discussed with Dr. Arloa Koh, PA-C 12/15/12 2223

## 2012-12-15 NOTE — Progress Notes (Signed)
Report given to lauren rn as caregiver 

## 2012-12-15 NOTE — ED Notes (Signed)
PA at bedside to put quick clot on

## 2012-12-15 NOTE — Consult Note (Signed)
Vascular Surgery Consultation  Reason for Consult: Bleeding from left forearm AV  HPI: Brad [name] is a 77 y.o. male who presents for evaluation of bleeding from left forearm AV graft patient went to hemodialysis this morning and began bleeding from ulcerated area on the left forearm AV graft. No previous history of bleeding. Patient did have an intervention performed within the last few weeks with balloon angioplasty at an out patient setting. Patient has had no chills and fever. Graft has been present for several years with multiple percutaneous interventions most recently in March of this year prior to the one a few weeks ago.   Past Medical History  Diagnosis Date  . Hypertension   . High cholesterol   . Gout   . Anemia   . Renal disorder     End stage renal disease secondary to HTN nephrosclerosis  . Secondary hyperparathyroidism   . Hypocalcemia   . Colon polyps   . Paroxysmal supraventricular tachycardia   . Status post dilatation of esophageal stricture   . Syncope    Past Surgical History  Procedure Laterality Date  . Right arm amputation      History   Social History  . Marital Status: Married    Spouse Name: N/A    Number of Children: N/A  . Years of Education: N/A   Social History Main Topics  . Smoking status: None  . Smokeless tobacco: None  . Alcohol Use: None  . Drug Use: None  . Sexual Activity: None   Other Topics Concern  . None   Social History Narrative  . None   No family history on file. family history positive for coronary artery disease-father died at age 32 Negative for diabetes or stroke No Known Allergies Prior to Admission medications   Medication Sig Start Date End Date Taking? Authorizing Provider  aspirin 81 MG EC tablet Take 81 mg by mouth daily.     Yes Historical Provider, MD  atorvastatin (LIPITOR) 40 MG tablet 1/2 tab by mouth at bedtime    Yes Historical Provider, MD  B Complex-C-Folic Acid (DIALYVITE TABLET) TABS  Take 1 tablet by mouth daily.     Yes Historical Provider, MD  calcium acetate (PHOSLO) 667 MG capsule Take 667 mg by mouth 3 (three) times daily with meals. And 1 cap with snack    Yes Historical Provider, MD  colchicine 0.6 MG tablet Take 0.6 mg by mouth daily.     Yes Historical Provider, MD  hydrALAZINE (APRESOLINE) 50 MG tablet Take 50 mg by mouth 2 (two) times daily. 12/12/12  Yes Historical Provider, MD  metoprolol (TOPROL-XL) 100 MG 24 hr tablet Take 100 mg by mouth daily.     Yes Historical Provider, MD  omeprazole (PRILOSEC) 20 MG capsule Take 20 mg by mouth daily.     Yes Historical Provider, MD  amLODipine (NORVASC) 10 MG tablet Take 10 mg by mouth at bedtime. For blood pressure     Historical Provider, MD     Positive ROS: Denies chest pain, dyspnea on exertion, PND, orthopnea, hemoptysis. Patient has history of right upper arm amputation many years ago  All other systems have been reviewed and were otherwise negative with the exception of those mentioned in the HPI and as above.  Physical Exam: Filed Vitals:   12/15/12 0900  BP:   Pulse: 68  Temp:   Resp: 15    General: Alert, no acute distress HEENT: Normal for age Cardiovascular: Regular rate and  rhythm. Carotid pulses 2+, no bruits audible Respiratory: Clear to auscultation. No cyanosis, no use of accessory musculature GI: No organomegaly, abdomen is soft and non-tender Skin: No lesions in the area of chief complaint Neurologic: Sensation intact distally Psychiatric: Patient is competent for consent with normal mood and affect Musculoskeletal: No obvious deformities other than right mid upper arm amputation Extremities: Left arm with loop AV graft in the forearm. On the radial side of the loop at the 4 to 5:00 position there is a 1 cm ulcerated area where the hemorrhage occurred. No active bleeding at the present time. No evidence of cellulitis or purulent drainage.    Imaging reviewed: Reviewed the shuntogram  performed in March of 2014 which revealed a linear venous outflow stenosis which was treated with balloon angioplasty. No other problems noted with the loop graft    Assessment/Plan:  Patient with end-stage renal disease with bleeding today from left forearm AV graft with ulcerated area on arterial half of loop Plan to route another segment of graft around this to prevent further bleeding. Discussed with patient and he is in agreement   [name], MD 12/15/2012 9:35 AM

## 2012-12-15 NOTE — Anesthesia Preprocedure Evaluation (Signed)
Anesthesia Evaluation  Patient identified by MRN, date of birth, ID band Patient awake    Reviewed: Allergy & Precautions, H&P , NPO status   Airway Mallampati: I  Neck ROM: Full    Dental   Pulmonary COPD breath sounds clear to auscultation        Cardiovascular hypertension, + angina + dysrhythmias Rhythm:Regular Rate:Normal     Neuro/Psych    GI/Hepatic   Endo/Other    Renal/GU ESRF and DialysisRenal disease     Musculoskeletal   Abdominal   Peds  Hematology   Anesthesia Other Findings   Reproductive/Obstetrics                           Anesthesia Physical Anesthesia Plan  ASA: III and emergent  Anesthesia Plan: General   Post-op Pain Management:    Induction: Intravenous  Airway Management Planned: LMA  Additional Equipment:   Intra-op Plan:   Post-operative Plan: Extubation in OR  Informed Consent: I have reviewed the patients History and Physical, chart, labs and discussed the procedure including the risks, benefits and alternatives for the proposed anesthesia with the patient or authorized representative who has indicated his/her understanding and acceptance.   Dental advisory given  Plan Discussed with: CRNA and Surgeon  Anesthesia Plan Comments:         Anesthesia Quick Evaluation

## 2012-12-15 NOTE — ED Notes (Signed)
Phlebotomy aware of need for blood and order to stick foot.

## 2012-12-15 NOTE — Transfer of Care (Signed)
Immediate Anesthesia Transfer of Care Note  Patient: Brad [name]  Procedure(s) Performed: Procedure(s): REVISION OF ARTERIOVENOUS GORETEX GRAFT using 6mm x 20cm Gortex graft (Left)  Patient Location: PACU  Anesthesia Type:General  Level of Consciousness: awake, oriented, patient cooperative and responds to stimulation  Airway & Oxygen Therapy: Patient Spontanous Breathing and Patient connected to nasal cannula oxygen  Post-op Assessment: Report given to PACU RN and Post -op Vital signs reviewed and stable  Post vital signs: Reviewed and stable  Complications: No apparent anesthesia complications

## 2012-12-15 NOTE — ED Notes (Signed)
Family at bedside. 

## 2012-12-15 NOTE — H&P (View-Only) (Signed)
Vascular Surgery Consultation  Reason for Consult: Bleeding from left forearm AV  HPI: Brad [name] is a 76 y.o. male who presents for evaluation of bleeding from left forearm AV graft patient went to hemodialysis this morning and began bleeding from ulcerated area on the left forearm AV graft. No previous history of bleeding. Patient did have an intervention performed within the last few weeks with balloon angioplasty at an out patient setting. Patient has had no chills and fever. Graft has been present for several years with multiple percutaneous interventions most recently in March of this year prior to the one a few weeks ago.   Past Medical History  Diagnosis Date  . Hypertension   . High cholesterol   . Gout   . Anemia   . Renal disorder     End stage renal disease secondary to HTN nephrosclerosis  . Secondary hyperparathyroidism   . Hypocalcemia   . Colon polyps   . Paroxysmal supraventricular tachycardia   . Status post dilatation of esophageal stricture   . Syncope    Past Surgical History  Procedure Laterality Date  . Right arm amputation      History   Social History  . Marital Status: Married    Spouse Name: N/A    Number of Children: N/A  . Years of Education: N/A   Social History Main Topics  . Smoking status: None  . Smokeless tobacco: None  . Alcohol Use: None  . Drug Use: None  . Sexual Activity: None   Other Topics Concern  . None   Social History Narrative  . None   No family history on file. family history positive for coronary artery disease-father died at age 59 Negative for diabetes or stroke No Known Allergies Prior to Admission medications   Medication Sig Start Date End Date Taking? Authorizing Provider  aspirin 81 MG EC tablet Take 81 mg by mouth daily.     Yes Historical Provider, MD  atorvastatin (LIPITOR) 40 MG tablet 1/2 tab by mouth at bedtime    Yes Historical Provider, MD  B Complex-C-Folic Acid (DIALYVITE TABLET) TABS  Take 1 tablet by mouth daily.     Yes Historical Provider, MD  calcium acetate (PHOSLO) 667 MG capsule Take 667 mg by mouth 3 (three) times daily with meals. And 1 cap with snack    Yes Historical Provider, MD  colchicine 0.6 MG tablet Take 0.6 mg by mouth daily.     Yes Historical Provider, MD  hydrALAZINE (APRESOLINE) 50 MG tablet Take 50 mg by mouth 2 (two) times daily. 12/12/12  Yes Historical Provider, MD  metoprolol (TOPROL-XL) 100 MG 24 hr tablet Take 100 mg by mouth daily.     Yes Historical Provider, MD  omeprazole (PRILOSEC) 20 MG capsule Take 20 mg by mouth daily.     Yes Historical Provider, MD  amLODipine (NORVASC) 10 MG tablet Take 10 mg by mouth at bedtime. For blood pressure     Historical Provider, MD     Positive ROS: Denies chest pain, dyspnea on exertion, PND, orthopnea, hemoptysis. Patient has history of right upper arm amputation many years ago  All other systems have been reviewed and were otherwise negative with the exception of those mentioned in the HPI and as above.  Physical Exam: Filed Vitals:   12/15/12 0900  BP:   Pulse: 68  Temp:   Resp: 15    General: Alert, no acute distress HEENT: Normal for age Cardiovascular: Regular rate and   rhythm. Carotid pulses 2+, no bruits audible Respiratory: Clear to auscultation. No cyanosis, no use of accessory musculature GI: No organomegaly, abdomen is soft and non-tender Skin: No lesions in the area of chief complaint Neurologic: Sensation intact distally Psychiatric: Patient is competent for consent with normal mood and affect Musculoskeletal: No obvious deformities other than right mid upper arm amputation Extremities: Left arm with loop AV graft in the forearm. On the radial side of the loop at the 4 to 5:00 position there is a 1 cm ulcerated area where the hemorrhage occurred. No active bleeding at the present time. No evidence of cellulitis or purulent drainage.    Imaging reviewed: Reviewed the shuntogram  performed in March of 2014 which revealed a linear venous outflow stenosis which was treated with balloon angioplasty. No other problems noted with the loop graft    Assessment/Plan:  Patient with end-stage renal disease with bleeding today from left forearm AV graft with ulcerated area on arterial half of loop Plan to route another segment of graft around this to prevent further bleeding. Discussed with patient and he is in agreement   Takao Lizer, MD 12/15/2012 9:35 AM      

## 2012-12-15 NOTE — ED Notes (Signed)
Pt was waiting in the waiting room to be dialyzed when his fistula burst, dialysis nurse applied "clamp" to access and called EMS. EMS transported pt to ED to be evaluated and to have his access reestablished. 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 Pt denies pain. Pt dialyzes M,W, F. EMS is unsure of how much blood pt lost.

## 2012-12-16 ENCOUNTER — Telehealth: Payer: Self-pay | Admitting: Vascular Surgery

## 2012-12-16 NOTE — Telephone Encounter (Addendum)
Message copied by Rosalyn Charters on Thu Dec 16, 2012  9:23 AM ------      Message from: [place], [place] K      Created: Thu Dec 16, 2012  8:42 AM      Regarding: schedule                   ----- Message -----         From: Dara Lords, PA-C         Sent: 12/15/2012   2:17 PM           To: Sharee Pimple, CMA            S/p revision of AVGG for bleeding 12/15/12.  F/u with Dr. Hart Rochester in 2 weeks.            Thanks,      Lelon Mast ------  notified patient's wife of post op appt. on 01-11-13 8:30 am with dr. Hart Rochester

## 2012-12-17 ENCOUNTER — Encounter (HOSPITAL_COMMUNITY): Payer: Self-pay | Admitting: Vascular Surgery

## 2013-01-10 ENCOUNTER — Encounter: Payer: Self-pay | Admitting: Vascular Surgery

## 2013-01-11 ENCOUNTER — Encounter: Payer: Medicare Other | Admitting: Vascular Surgery

## 2013-01-11 ENCOUNTER — Ambulatory Visit (INDEPENDENT_AMBULATORY_CARE_PROVIDER_SITE_OTHER): Payer: Self-pay | Admitting: Vascular Surgery

## 2013-01-11 ENCOUNTER — Encounter: Payer: Self-pay | Admitting: Vascular Surgery

## 2013-01-11 VITALS — BP 183/96 | HR 73 | Resp 16 | Ht 71.0 in | Wt 129.0 lb

## 2013-01-11 DIAGNOSIS — N186 End stage renal disease: Secondary | ICD-10-CM

## 2013-01-11 NOTE — Progress Notes (Signed)
[facility] POST [facility]PERATIVE OFFICE NOTE  CC:  F/u for surgery  HPI:  This is a 77 y.o. male who is s/p replacement of arterial half of the left FA AVG and thrombectomy of existing AVG.  He states he has done well and had not problems with HD.  They are continuing to stick only the venous side.  He denies any steal symptoms with exception of numbness in his had occasionally.  No Known Allergies  No current facility-administered medications for this visit.   Current Outpatient Prescriptions  Medication Sig Dispense Refill  . oxyCODONE (ROXICODONE) 5 MG immediate release tablet Take 1 tablet (5 mg total) by mouth every 6 (six) hours as needed for pain.  30 tablet  0   Facility-Administered Medications Ordered in Other Visits  Medication Dose Route Frequency Provider Last Rate Last Dose  . 0.9 %  sodium chloride infusion   Intravenous Continuous Josepha Pigg, MD 10 mL/hr at 12/15/12 1115 10 mL/hr at 12/15/12 1115  . fentaNYL (SUBLIMAZE) injection 25-50 mcg  25-50 mcg Intravenous Q5 min PRN Josepha Pigg, MD      . promethazine (PHENERGAN) injection 6.25 mg  6.25 mg Intravenous Q15 min PRN Josepha Pigg, MD         ROS:  See HPI  Physical Exam:  Filed Vitals:   01/11/13 1502  BP: 183/96  Pulse: 73  Resp: 16    Incision:  C/d/i-well healed Extremities:  + palpable left radial pulse.  There is a good thrill within the graft.   A/P:  This is a 77 y.o. male here for f/u ti his replacement of arterial half of the left FA AVG and thrombectomy of existing AVG by Dr. Hart Rochester on 12/15/12.  -his graft is functioning well. -May start using the arterial side of the graft on January 17, 2013.  Discussed with pt and he expresses understanding. -will see the pt back on an as needed basis.  Doreatha Massed, PA-C [facility] [phone]  Clinic MD:  Pt seen and examined with Dr. Hart Rochester  Agree with above assessment Left forearm  graft revision looks good and able to be utilized next week and return to see [place] on when necessary basis

## 2013-06-04 ENCOUNTER — Emergency Department (HOSPITAL_COMMUNITY): Payer: Medicare Other

## 2013-06-04 ENCOUNTER — Encounter (HOSPITAL_COMMUNITY): Payer: Self-pay | Admitting: Emergency Medicine

## 2013-06-04 ENCOUNTER — Emergency Department (HOSPITAL_COMMUNITY)
Admission: EM | Admit: 2013-06-04 | Discharge: 2013-06-04 | Disposition: A | Discharge status: Home / Self Care | Payer: Medicare Other | Attending: Emergency Medicine | Admitting: Emergency Medicine

## 2013-06-04 DIAGNOSIS — Z860100 Personal history of colon polyps, unspecified: Secondary | ICD-10-CM | POA: Insufficient documentation

## 2013-06-04 DIAGNOSIS — W010XXA Fall on same level from slipping, tripping and stumbling without subsequent striking against object, initial encounter: Secondary | ICD-10-CM | POA: Insufficient documentation

## 2013-06-04 DIAGNOSIS — S79911A Unspecified injury of right hip, initial encounter: Secondary | ICD-10-CM

## 2013-06-04 DIAGNOSIS — S79919A Unspecified injury of unspecified hip, initial encounter: Secondary | ICD-10-CM | POA: Insufficient documentation

## 2013-06-04 DIAGNOSIS — D649 Anemia, unspecified: Secondary | ICD-10-CM | POA: Insufficient documentation

## 2013-06-04 DIAGNOSIS — I120 Hypertensive chronic kidney disease with stage 5 chronic kidney disease or end stage renal disease: Secondary | ICD-10-CM | POA: Insufficient documentation

## 2013-06-04 DIAGNOSIS — S79929A Unspecified injury of unspecified thigh, initial encounter: Principal | ICD-10-CM

## 2013-06-04 DIAGNOSIS — M109 Gout, unspecified: Secondary | ICD-10-CM | POA: Insufficient documentation

## 2013-06-04 DIAGNOSIS — I471 Supraventricular tachycardia, unspecified: Secondary | ICD-10-CM | POA: Insufficient documentation

## 2013-06-04 DIAGNOSIS — E7800 Pure hypercholesterolemia, unspecified: Secondary | ICD-10-CM | POA: Insufficient documentation

## 2013-06-04 DIAGNOSIS — Y939 Activity, unspecified: Secondary | ICD-10-CM | POA: Insufficient documentation

## 2013-06-04 DIAGNOSIS — N186 End stage renal disease: Secondary | ICD-10-CM | POA: Insufficient documentation

## 2013-06-04 DIAGNOSIS — Y92009 Unspecified place in unspecified non-institutional (private) residence as the place of occurrence of the external cause: Secondary | ICD-10-CM | POA: Insufficient documentation

## 2013-06-04 DIAGNOSIS — Z79899 Other long term (current) drug therapy: Secondary | ICD-10-CM | POA: Insufficient documentation

## 2013-06-04 DIAGNOSIS — Z992 Dependence on renal dialysis: Secondary | ICD-10-CM | POA: Insufficient documentation

## 2013-06-04 DIAGNOSIS — Z7982 Long term (current) use of aspirin: Secondary | ICD-10-CM | POA: Insufficient documentation

## 2013-06-04 MED ORDER — OXYCODONE HCL 5 MG PO TABS: 5.0000 mg | ORAL_TABLET | Freq: Four times a day (QID) | ORAL | Status: DC | PRN

## 2013-06-04 NOTE — ED Notes (Signed)
Discharge BP was taken on LLE, pt has RUE amputation and LUE is restricted for dialysis. His discharge BP is elevated, he states that his BP is normally that high, edpa bowie aware and states it is okay to discharge the pt

## 2013-06-04 NOTE — ED Provider Notes (Addendum)
CSN: [id number]     Arrival date & time 06/04/13  2409 History   First MD Initiated Contact with Patient 06/04/13 [phone]     Chief Complaint  Patient presents with  . Hip Pain  . Fall     (Consider location/radiation/quality/duration/timing/severity/associated sxs/prior Treatment) HPI'  78 year old male presents for evaluation of right hip pain. Patient reports he fell inside his home on the carpet floor about 4 days ago. States he tripped over an object, fell and landed his right hip directly on the carpet. He denies hitting his head or any loss of consciousness. He was able to and they afterward but noticed a sharp pain to right side of hip worsening with ambulation and improved with rest. He has been taking Aleve, and IcyHot cream with some improvement, however he had a difficult time getting out of bed today due to his hip pain. He is a dialysis patient and has not missed any dialysis. Patient denies fever, abdominal pain, back pain, knee or ankle pain, numbness or weakness. Denies any precipitating symptoms prior to fall   Past Medical History  Diagnosis Date  . Hypertension   . High cholesterol   . Gout   . Anemia   . Renal disorder     End stage renal disease secondary to HTN nephrosclerosis  . Secondary hyperparathyroidism   . Hypocalcemia   . Colon polyps   . Paroxysmal supraventricular tachycardia   . Status post dilatation of esophageal stricture   . Syncope    Past Surgical History  Procedure Laterality Date  . Right arm amputation     . Revision of arteriovenous goretex graft Left 12/15/2012    Procedure: REVISION OF ARTERIOVENOUS GORETEX GRAFT using 73mm x 20cm Gortex graft;  Surgeon: Mal Misty, MD;  Location: [facility];  Service: Vascular;  Laterality: Left;   History reviewed. No pertinent family history. History  Substance Use Topics  . Smoking status: Never Smoker   . Smokeless tobacco: Not on file  . Alcohol Use: No    Review of Systems  Constitutional:  Negative for fever.  Musculoskeletal: Positive for arthralgias. Negative for back pain.  Skin: Negative for wound.  Neurological: Negative for numbness and headaches.      Allergies  Review of patient's allergies indicates no known allergies.  Home Medications   Current Outpatient Rx  Name  Route  Sig  Dispense  Refill  . amLODipine (NORVASC) 10 MG tablet   Oral   Take 10 mg by mouth at bedtime. For blood pressure          . aspirin 81 MG EC tablet   Oral   Take 81 mg by mouth daily.           Marland Kitchen atorvastatin (LIPITOR) 40 MG tablet      1/2 tab by mouth at bedtime          . B Complex-C-Folic Acid (DIALYVITE TABLET) TABS   Oral   Take 1 tablet by mouth daily.           . calcium acetate (PHOSLO) 667 MG capsule   Oral   Take 667 mg by mouth 3 (three) times daily with meals. And 1 cap with snack          . colchicine 0.6 MG tablet   Oral   Take 0.6 mg by mouth daily.           . folic acid-vitamin b complex-vitamin c-selenium-zinc (DIALYVITE) 3 MG TABS tablet  Oral   Take 1 tablet by mouth daily.         . hydrALAZINE (APRESOLINE) 50 MG tablet   Oral   Take 50 mg by mouth 2 (two) times daily.         . metoprolol (TOPROL-XL) 100 MG 24 hr tablet   Oral   Take 100 mg by mouth daily.           Marland Kitchen omeprazole (PRILOSEC) 20 MG capsule   Oral   Take 20 mg by mouth daily.           Marland Kitchen oxyCODONE (ROXICODONE) 5 MG immediate release tablet   Oral   Take 1 tablet (5 mg total) by mouth every 6 (six) hours as needed for pain.   30 tablet   0    BP 172/93  Pulse 72  Temp(Src) 97.6 F (36.4 C) (Oral)  Resp 18  SpO2 99% Physical Exam  Constitutional: He appears well-developed and well-nourished. No distress.  HENT:  Head: Atraumatic.  Eyes: Conjunctivae are normal.  Neck: Normal range of motion. Neck supple.  Musculoskeletal: He exhibits tenderness (R hip: tenderness to lateral hip without overlying skin changes, bruising, laceration,  deformity.  normal hip flexion/extension/inversion/eversion.  intact distal pulses.).  Normal R knee and R ankle with FROM, nontender  Neurological: He is alert.  Skin: No rash noted.  Psychiatric: He has a normal mood and affect.    ED Course  Procedures (including critical care time)  9:35 AM Patient had a mechanical fall injuring his right hip 4 days ago. He is able to ambulate. Right hip exam was unremarkable. X-ray of right hip ordered. Otherwise he is neurovascularly intact.  10:22 AM History negative for acute fractures or dislocation. Patient is stable for discharge with close followup with his Dr. for further care. Care discussed with Dr. Audie Pinto. Pt's BP elevated, recommend recheck by PCP.  Pt is otherwise asymptomatic and sts his BP is usually that high.    Labs Review Labs Reviewed - No data to display Imaging Review Dg Hip Complete Right  06/04/2013   CLINICAL DATA:  Fall  EXAM: RIGHT HIP - COMPLETE 2+ VIEW  COMPARISON:  None.  FINDINGS: Three views of the right hip submitted. Study is limited by diffuse osteopenia. No gross fracture or subluxation. Bilateral hip joints are symmetrical in appearance.  IMPRESSION: Limited study by diffuse osteopenia. No gross fracture or subluxation.   Electronically Signed   By: Lahoma Crocker M.D.   On: 06/04/2013 09:43     EKG Interpretation None      MDM   Final diagnoses:  Injury of right hip    BP 172/93  Pulse 72  Temp(Src) 97.6 F (36.4 C) (Oral)  Resp 18  SpO2 99%  I have reviewed nursing notes and vital signs. I personally reviewed the imaging tests through PACS system  I reviewed available ER/hospitalization records thought the EMR     Domenic Moras, PA-C 06/04/13 [place], PA-C 06/04/13 1056

## 2013-06-04 NOTE — ED Notes (Signed)
Pt reports falling around 1 week ago, did not think he injured his right hip but has been having right hip pain x 2-3 days ago, was difficult for him to get out of bed this am due to pain. Pt is dialysis pt, last treatment was yesterday. Ambulatory and no acute distress noted at triage.

## 2013-06-04 NOTE — ED Notes (Signed)
Patient arrived to room 32 D from waiting room.

## 2013-06-04 NOTE — ED Notes (Signed)
Pt is a dialysis pt and only has one arm of which his graft is in, blood pressures are to be taken per pt from the left leg only

## 2013-06-04 NOTE — Discharge Instructions (Signed)

## 2013-06-04 NOTE — ED Provider Notes (Signed)
Medical screening examination/treatment/procedure(s) were performed by non-physician practitioner and as supervising physician I was immediately available for consultation/collaboration.    Johnwilliam Shepperson L Yadriel Kerrigan, MD 06/04/13 1044 

## 2013-06-05 NOTE — ED Provider Notes (Signed)
Medical screening examination/treatment/procedure(s) were performed by non-physician practitioner and as supervising physician I was immediately available for consultation/collaboration.    Shandricka Monroy L Arihanna Estabrook, MD 06/05/13 0817 

## 2013-06-08 ENCOUNTER — Emergency Department (HOSPITAL_COMMUNITY): Payer: Medicare Other

## 2013-06-08 ENCOUNTER — Emergency Department (HOSPITAL_COMMUNITY)
Admission: EM | Admit: 2013-06-08 | Discharge: 2013-06-08 | Disposition: A | Discharge status: Home / Self Care | Payer: Medicare Other | Attending: Emergency Medicine | Admitting: Emergency Medicine

## 2013-06-08 ENCOUNTER — Encounter (HOSPITAL_COMMUNITY): Payer: Self-pay | Admitting: Emergency Medicine

## 2013-06-08 DIAGNOSIS — Y9389 Activity, other specified: Secondary | ICD-10-CM | POA: Insufficient documentation

## 2013-06-08 DIAGNOSIS — R000 Tachycardia, unspecified: Secondary | ICD-10-CM | POA: Insufficient documentation

## 2013-06-08 DIAGNOSIS — Y929 Unspecified place or not applicable: Secondary | ICD-10-CM | POA: Insufficient documentation

## 2013-06-08 DIAGNOSIS — N186 End stage renal disease: Secondary | ICD-10-CM | POA: Insufficient documentation

## 2013-06-08 DIAGNOSIS — E7800 Pure hypercholesterolemia, unspecified: Secondary | ICD-10-CM | POA: Insufficient documentation

## 2013-06-08 DIAGNOSIS — Z7982 Long term (current) use of aspirin: Secondary | ICD-10-CM | POA: Insufficient documentation

## 2013-06-08 DIAGNOSIS — Z860100 Personal history of colon polyps, unspecified: Secondary | ICD-10-CM | POA: Insufficient documentation

## 2013-06-08 DIAGNOSIS — I120 Hypertensive chronic kidney disease with stage 5 chronic kidney disease or end stage renal disease: Secondary | ICD-10-CM | POA: Insufficient documentation

## 2013-06-08 DIAGNOSIS — T148XXA Other injury of unspecified body region, initial encounter: Secondary | ICD-10-CM

## 2013-06-08 DIAGNOSIS — D649 Anemia, unspecified: Secondary | ICD-10-CM | POA: Insufficient documentation

## 2013-06-08 DIAGNOSIS — J111 Influenza due to unidentified influenza virus with other respiratory manifestations: Secondary | ICD-10-CM | POA: Insufficient documentation

## 2013-06-08 DIAGNOSIS — M109 Gout, unspecified: Secondary | ICD-10-CM | POA: Insufficient documentation

## 2013-06-08 DIAGNOSIS — M25551 Pain in right hip: Secondary | ICD-10-CM

## 2013-06-08 DIAGNOSIS — W19XXXA Unspecified fall, initial encounter: Secondary | ICD-10-CM | POA: Insufficient documentation

## 2013-06-08 DIAGNOSIS — Z79899 Other long term (current) drug therapy: Secondary | ICD-10-CM | POA: Insufficient documentation

## 2013-06-08 DIAGNOSIS — IMO0002 Reserved for concepts with insufficient information to code with codable children: Secondary | ICD-10-CM | POA: Insufficient documentation

## 2013-06-08 LAB — I-STAT CHEM 8, ED
BUN: 9 mg/dL (ref 6–23)
Calcium, Ion: 0.96 mmol/L — ABNORMAL LOW (ref 1.13–1.30)
Chloride: 94 mEq/L — ABNORMAL LOW (ref 96–112)
Creatinine, Ser: 5.3 mg/dL — ABNORMAL HIGH (ref 0.50–1.35)
Glucose, Bld: 101 mg/dL — ABNORMAL HIGH (ref 70–99)
HEMATOCRIT: 33 % — AB (ref 39.0–52.0)
HEMOGLOBIN: 11.2 g/dL — AB (ref 13.0–17.0)
Potassium: 3.9 mEq/L (ref 3.7–5.3)
SODIUM: 138 meq/L (ref 137–147)
TCO2: 32 mmol/L (ref 0–100)

## 2013-06-08 LAB — CBC WITH DIFFERENTIAL/PLATELET
Basophils Absolute: 0 10*3/uL (ref 0.0–0.1)
Basophils Relative: 1 % (ref 0–1)
EOS ABS: 0.3 10*3/uL (ref 0.0–0.7)
Eosinophils Relative: 4 % (ref 0–5)
HCT: 31.9 % — ABNORMAL LOW (ref 39.0–52.0)
HEMOGLOBIN: 10.8 g/dL — AB (ref 13.0–17.0)
LYMPHS ABS: 0.6 10*3/uL — AB (ref 0.7–4.0)
Lymphocytes Relative: 10 % — ABNORMAL LOW (ref 12–46)
MCH: 30.6 pg (ref 26.0–34.0)
MCHC: 33.9 g/dL (ref 30.0–36.0)
MCV: 90.4 fL (ref 78.0–100.0)
Monocytes Absolute: 0.8 10*3/uL (ref 0.1–1.0)
Monocytes Relative: 13 % — ABNORMAL HIGH (ref 3–12)
NEUTROS ABS: 4.5 10*3/uL (ref 1.7–7.7)
NEUTROS PCT: 73 % (ref 43–77)
PLATELETS: 212 10*3/uL (ref 150–400)
RBC: 3.53 MIL/uL — AB (ref 4.22–5.81)
RDW: 17.4 % — ABNORMAL HIGH (ref 11.5–15.5)
WBC: 6.2 10*3/uL (ref 4.0–10.5)

## 2013-06-08 LAB — COMPREHENSIVE METABOLIC PANEL
ALBUMIN: 3.1 g/dL — AB (ref 3.5–5.2)
ALK PHOS: 67 U/L (ref 39–117)
ALT: 14 U/L (ref 0–53)
AST: 30 U/L (ref 0–37)
BILIRUBIN TOTAL: 0.2 mg/dL — AB (ref 0.3–1.2)
BUN: 9 mg/dL (ref 6–23)
CHLORIDE: 94 meq/L — AB (ref 96–112)
CO2: 28 mEq/L (ref 19–32)
Calcium: 8.6 mg/dL (ref 8.4–10.5)
Creatinine, Ser: 4.47 mg/dL — ABNORMAL HIGH (ref 0.50–1.35)
GFR calc non Af Amer: 12 mL/min — ABNORMAL LOW (ref >90)
GFR, EST AFRICAN AMERICAN: 14 mL/min — AB (ref >90)
GLUCOSE: 98 mg/dL (ref 70–99)
POTASSIUM: 4.2 meq/L (ref 3.7–5.3)
SODIUM: 139 meq/L (ref 137–147)
TOTAL PROTEIN: 7.3 g/dL (ref 6.0–8.3)

## 2013-06-08 LAB — SEDIMENTATION RATE: SED RATE: 60 mm/h — AB (ref 0–16)

## 2013-06-08 LAB — INFLUENZA PANEL BY PCR (TYPE A & B)
H1N1 flu by pcr: NOT DETECTED
Influenza A By PCR: POSITIVE — AB
Influenza B By PCR: NEGATIVE

## 2013-06-08 LAB — I-STAT CG4 LACTIC ACID, ED: Lactic Acid, Venous: 1.37 mmol/L (ref 0.5–2.2)

## 2013-06-08 LAB — C-REACTIVE PROTEIN: CRP: 6.5 mg/dL — AB (ref <0.60)

## 2013-06-08 MED ORDER — OSELTAMIVIR PHOSPHATE 30 MG PO CAPS: 30.0000 mg | ORAL_CAPSULE | Freq: Two times a day (BID) | ORAL | Status: DC

## 2013-06-08 MED ORDER — HYDROCODONE-ACETAMINOPHEN 5-325 MG PO TABS
1.0000 | ORAL_TABLET | Freq: Once | ORAL | Status: AC
  Administered 2013-06-08: 1 via ORAL
  Filled 2013-06-08: qty 1

## 2013-06-08 MED ORDER — OSELTAMIVIR PHOSPHATE 30 MG PO CAPS
30.0000 mg | ORAL_CAPSULE | Freq: Once | ORAL | Status: AC
  Administered 2013-06-08: 30 mg via ORAL
  Filled 2013-06-08 (×2): qty 1

## 2013-06-08 MED ORDER — OSELTAMIVIR PHOSPHATE 75 MG PO CAPS: 75.0000 mg | ORAL_CAPSULE | Freq: Once | ORAL | Status: DC

## 2013-06-08 MED ORDER — OSELTAMIVIR PHOSPHATE 30 MG PO CAPS: 30.0000 mg | ORAL_CAPSULE | Freq: Every day | ORAL | Status: DC

## 2013-06-08 MED ORDER — OSELTAMIVIR PHOSPHATE 75 MG PO CAPS: 75.0000 mg | ORAL_CAPSULE | Freq: Every day | ORAL | Status: DC

## 2013-06-08 MED ORDER — OSELTAMIVIR PHOSPHATE 30 MG PO CAPS: 30.0000 mg | ORAL_CAPSULE | ORAL | Status: DC

## 2013-06-08 MED ORDER — NAPROXEN 250 MG PO TABS
250.0000 mg | ORAL_TABLET | Freq: Once | ORAL | Status: AC
  Administered 2013-06-08: 250 mg via ORAL
  Filled 2013-06-08: qty 1

## 2013-06-08 NOTE — ED Notes (Signed)
Pt transported to xray 

## 2013-06-08 NOTE — ED Notes (Signed)
Pt fell Saturday.  Pt was seen here and d/c'd home.  Pt presents today with continued R hip pain.

## 2013-06-08 NOTE — ED Notes (Signed)
NOTIFIED DR. RANCOUR OF PATIENTS LAB RESULTS OF CG4+ LACTIC ACID @16 :43 PM ,06/08/2013.

## 2013-06-08 NOTE — Discharge Instructions (Signed)

## 2013-06-08 NOTE — ED Provider Notes (Signed)
CSN: [id number]     Arrival date & time 06/08/13  1350 History   First MD Initiated Contact with Patient 06/08/13 1512     Chief Complaint  Patient presents with  . Hip Pain      HPI  78 year old male presents for evaluation of right hip pain. Patient fell on 2/24 and was evaluated for R hip pain on 2/28. XR of Hip was negative that day. Patient was able to bear weight according to note on the 28th. He was discharged home with instructions to follow up with his PCP. Patient now return s with continued pain to his R hip. Per patient he is unable to bear weight on his RLE. He also reports development of cough, chills and general malaise yesterday. On arrival patient is febrile. He denies any HA, CP, SOB, rash, ABD pain.     Past Medical History  Diagnosis Date  . Hypertension   . High cholesterol   . Gout   . Anemia   . Renal disorder     End stage renal disease secondary to HTN nephrosclerosis  . Secondary hyperparathyroidism   . Hypocalcemia   . Colon polyps   . Paroxysmal supraventricular tachycardia   . Status post dilatation of esophageal stricture   . Syncope    Past Surgical History  Procedure Laterality Date  . Right arm amputation     . Revision of arteriovenous goretex graft Left 12/15/2012    Procedure: REVISION OF ARTERIOVENOUS GORETEX GRAFT using 71mm x 20cm Gortex graft;  Surgeon: Mal Misty, MD;  Location: [facility];  Service: Vascular;  Laterality: Left;   No family history on file. History  Substance Use Topics  . Smoking status: Never Smoker   . Smokeless tobacco: Not on file  . Alcohol Use: No    Review of Systems  Constitutional: Negative for fever, activity change and appetite change.  HENT: Negative for congestion, ear pain, rhinorrhea, sinus pressure and sore throat.   Eyes: Negative for pain and redness.  Respiratory: Negative for cough, chest tightness and shortness of breath.   Cardiovascular: Negative for chest pain and palpitations.   Gastrointestinal: Negative for nausea, vomiting, abdominal pain, diarrhea and abdominal distention.  Genitourinary: Negative for dysuria, flank pain and difficulty urinating.  Musculoskeletal: Positive for gait problem and myalgias. Negative for back pain, neck pain and neck stiffness.       R hip pain   Skin: Negative for rash and wound.  Neurological: Negative for dizziness, light-headedness, numbness and headaches.  Hematological: Negative for adenopathy.  Psychiatric/Behavioral: Negative for behavioral problems, confusion and agitation.      Allergies  Review of patient's allergies indicates no known allergies.  Home Medications   Current Outpatient Rx  Name  Route  Sig  Dispense  Refill  . Acetaminophen (TYLENOL PO)   Oral   Take 2 tablets by mouth every 6 (six) hours as needed (pain).         Marland Kitchen amLODipine (NORVASC) 10 MG tablet   Oral   Take 10 mg by mouth at bedtime. For blood pressure          . aspirin 81 MG EC tablet   Oral   Take 81 mg by mouth daily.           Marland Kitchen atorvastatin (LIPITOR) 40 MG tablet   Oral   Take 40 mg by mouth every evening.          . B Complex-C-Folic Acid (DIALYVITE TABLET) TABS  Oral   Take 1 tablet by mouth daily.           . calcium acetate (PHOSLO) 667 MG capsule   Oral   Take 1,334 mg by mouth 3 (three) times daily with meals. And 1 cap with snack         . colchicine 0.6 MG tablet   Oral   Take 0.6 mg by mouth 3 (three) times a week. Monday, wednesday, friday         . hydrALAZINE (APRESOLINE) 50 MG tablet   Oral   Take 50 mg by mouth 2 (two) times daily.         . metoprolol (TOPROL-XL) 200 MG 24 hr tablet   Oral   Take 200 mg by mouth at bedtime.          Marland Kitchen omeprazole (PRILOSEC) 20 MG capsule   Oral   Take 20 mg by mouth daily.           Marland Kitchen oxyCODONE (ROXICODONE) 5 MG immediate release tablet   Oral   Take 1 tablet (5 mg total) by mouth every 6 (six) hours as needed for severe pain.   10  tablet   0   . triamcinolone cream (KENALOG) 0.1 %   Topical   Apply 1 application topically 2 (two) times daily.           BP 150/72  Pulse 89  Temp(Src) 101 F (38.3 C) (Rectal)  Resp 20  Ht 5\' 11"  (1.803 m)  Wt 135 lb (61.236 kg)  BMI 18.84 kg/m2  SpO2 97% Physical Exam  Constitutional: He is oriented to person, place, and time. He appears well-developed and well-nourished. No distress.  HENT:  Head: Normocephalic and atraumatic.  Nose: Nose normal.  Mouth/Throat: Oropharynx is clear and moist.  Eyes: Conjunctivae and EOM are normal. Pupils are equal, round, and reactive to light.  Neck: Normal range of motion. Neck supple. No tracheal deviation present.  Cardiovascular: Regular rhythm, normal heart sounds and intact distal pulses.   Tachycardia   Pulmonary/Chest: Effort normal and breath sounds normal. No respiratory distress. He has no rales.  Abdominal: Soft. Bowel sounds are normal. He exhibits no distension. There is no tenderness. There is no rebound and no guarding.  Musculoskeletal: Normal range of motion. He exhibits tenderness ( R hip). He exhibits no edema.  Painless ROM of R hip. No swelling or erythema , no warmth.   Neurological: He is alert and oriented to person, place, and time.  Skin: Skin is warm and dry.  Psychiatric: He has a normal mood and affect. His behavior is normal.    ED Course  Procedures (including critical care time) Labs Review Labs Reviewed  CBC WITH DIFFERENTIAL - Abnormal; Notable for the following:    RBC 3.53 (*)    Hemoglobin 10.8 (*)    HCT 31.9 (*)    RDW 17.4 (*)    Lymphocytes Relative 10 (*)    Lymphs Abs 0.6 (*)    Monocytes Relative 13 (*)    All other components within normal limits  COMPREHENSIVE METABOLIC PANEL - Abnormal; Notable for the following:    Chloride 94 (*)    Creatinine, Ser 4.47 (*)    Albumin 3.1 (*)    Total Bilirubin 0.2 (*)    GFR calc non Af Amer 12 (*)    GFR calc Af Amer 14 (*)    All  other components within normal limits  SEDIMENTATION RATE - Abnormal;  Notable for the following:    Sed Rate 60 (*)    All other components within normal limits  INFLUENZA PANEL BY PCR (TYPE A & B, H1N1) - Abnormal; Notable for the following:    Influenza A By PCR POSITIVE (*)    All other components within normal limits  I-STAT CHEM 8, ED - Abnormal; Notable for the following:    Chloride 94 (*)    Creatinine, Ser 5.30 (*)    Glucose, Bld 101 (*)    Calcium, Ion 0.96 (*)    Hemoglobin 11.2 (*)    HCT 33.0 (*)    All other components within normal limits  CULTURE, BLOOD (ROUTINE X 2)  CULTURE, BLOOD (ROUTINE X 2)  C-REACTIVE PROTEIN  I-STAT CG4 LACTIC ACID, ED   Imaging Review Dg Chest 2 View  06/08/2013   CLINICAL DATA:  Fever and cough.  EXAM: CHEST  2 VIEW  COMPARISON:  09/26/2010  FINDINGS: Chronic eventration and elevation of left hemidiaphragm is again seen. No evidence of pulmonary infiltrate or edema. No evidence of pleural effusion. Mild cardiomegaly and ectasia of the thoracic aorta are stable. No mass or lymphadenopathy identified.  IMPRESSION: Stable mild cardiomegaly and elevation of left hemidiaphragm. No active lung disease.   Electronically Signed   By: Earle Gell M.D.   On: 06/08/2013 17:25   Dg Femur Right  06/08/2013   CLINICAL DATA:  Right hip and femur pain.  EXAM: RIGHT FEMUR - 2 VIEW  COMPARISON:  None.  FINDINGS: There is no evidence of fracture or other focal bone lesions. Peripheral vascular calcification noted.  IMPRESSION: No acute findings.   Electronically Signed   By: Earle Gell M.D.   On: 06/08/2013 17:22   Ct Hip Right Wo Contrast  06/08/2013   CLINICAL DATA:  Patient fell Saturday.  Right hip pain.  EXAM: CT OF THE RIGHT HIP WITHOUT CONTRAST  TECHNIQUE: Multidetector CT imaging was performed according to the standard protocol. Multiplanar CT image reconstructions were also generated.  COMPARISON:  None.  FINDINGS: There is no right hip fracture or  dislocation. The joint space is maintained. The right superior and inferior pubic rami are intact. There is no fluid collection or hematoma. There is no lytic or sclerotic osseous lesion. There is bruising of the subcutaneous fat overlying the right greater trochanter.  There is peripheral vascular atherosclerotic disease.  IMPRESSION: No acute osseous injury of the right hip.   Electronically Signed   By: Kathreen Devoid   On: 06/08/2013 17:32   Mr Hip Right Wo Contrast  06/08/2013   CLINICAL DATA:  Right hip pain for 2-3 days.  Fell 1 week ago.  EXAM: MRI OF THE RIGHT HIP WITHOUT CONTRAST  TECHNIQUE: Multiplanar, multisequence MR imaging was performed. No intravenous contrast was administered.  COMPARISON:  None.  FINDINGS: There is no hip fracture, dislocation or avascular necrosis. Normal marrow signal. The sacrum and sacroiliac joints are normal. There is no fluid collection or hematoma. There is no joint effusion. The hamstring origins are normal. The iliopsoas tendons are normal bilaterally.  There is mild edema in the right quadratus femoris muscle. The remainder of the muscles are normal in signal.  IMPRESSION: 1. No right hip fracture, dislocation or avascular necrosis. 2. Mild edema in the right quadratus femoris muscle which may reflect muscle strain versus partial tear versus impingement.   Electronically Signed   By: Kathreen Devoid   On: 06/08/2013 19:37      MDM  Final diagnoses:  Influenza  Right hip pain  Muscle strain    78 yo M in NAD non toxic appearing presents with 1 days hx of cough and myalgias. Additionally patient is here with continued pain of his R hip. Recenlty seen on 2/28 with normal XR of hip. Patient has been unable to bear weight on RLE since the fall on 2/24. Will obtain CT of R hip. Given cough and fever will obtain labs and CXR. Patient has FROM of R hip doubt septic hip.   8:09 PM XR, CT, and MRI with no fracture. Influenza swab positive. Ambulated patient. Able to  bear weight on RLE. Patient with antalgic gait. Tamiflu given.   Thorough discussion with patient and family on plan, findings, return precautions. Case co managed with my attending Dr. Wyvonnia Dusky. Suspect hip pain is likely related to the "Mild edema in the right quadratus femoris muscle which may reflect muscle strain versus partial tear versus impingement."   Patient is to follow up with PCP in 1-2 days. RX for tamiflu given.     Ruthell Rummage, MD 06/08/13 2014

## 2013-06-09 NOTE — ED Provider Notes (Signed)
I saw and evaluated the patient, reviewed the resident's note and I agree with the findings and plan. If applicable, I agree with the resident's interpretation of the EKG.  If applicable, I was present for critical portions of any procedures performed.  R hip pain since fall 2/24, negative Xrays then. No new falls.  Also fever, cough, chills, malaise since yesterday. FROM R hip without pain. No evidence of septic joint. No warmth or erythema, intact distal pulses. Imaging negative for fracture effusion. Fever workup remarkable for influenza. Blood cultures pending BP 149/82  Pulse 89  Temp(Src) 101 F (38.3 C) (Rectal)  Resp 20  Ht 5\' 11"  (1.803 m)  Wt 135 lb (61.236 kg)  BMI 18.84 kg/m2  SpO2 96%   Ezequiel Essex, MD 06/09/13 [phone]

## 2013-06-14 LAB — CULTURE, BLOOD (ROUTINE X 2): Culture: NO GROWTH

## 2014-01-18 ENCOUNTER — Other Ambulatory Visit: Payer: Self-pay | Admitting: Gastroenterology

## 2014-01-27 ENCOUNTER — Ambulatory Visit (HOSPITAL_COMMUNITY)
Admission: RE | Admit: 2014-01-27 | Discharge: 2014-01-27 | Disposition: A | Discharge status: Home / Self Care | Payer: Medicare Other | Source: Ambulatory Visit | Attending: Gastroenterology | Admitting: Gastroenterology

## 2014-01-27 ENCOUNTER — Encounter (HOSPITAL_COMMUNITY)
Admission: RE | Disposition: A | Discharge status: Home / Self Care | Payer: Self-pay | Source: Ambulatory Visit | Attending: Gastroenterology

## 2014-01-27 ENCOUNTER — Encounter (HOSPITAL_COMMUNITY): Payer: Self-pay | Admitting: *Deleted

## 2014-01-27 DIAGNOSIS — E780 Pure hypercholesterolemia: Secondary | ICD-10-CM | POA: Insufficient documentation

## 2014-01-27 DIAGNOSIS — Q2733 Arteriovenous malformation of digestive system vessel: Secondary | ICD-10-CM | POA: Diagnosis not present

## 2014-01-27 DIAGNOSIS — Z86010 Personal history of colonic polyps: Secondary | ICD-10-CM | POA: Insufficient documentation

## 2014-01-27 DIAGNOSIS — K449 Diaphragmatic hernia without obstruction or gangrene: Secondary | ICD-10-CM | POA: Diagnosis not present

## 2014-01-27 DIAGNOSIS — I471 Supraventricular tachycardia: Secondary | ICD-10-CM | POA: Diagnosis not present

## 2014-01-27 DIAGNOSIS — M109 Gout, unspecified: Secondary | ICD-10-CM | POA: Insufficient documentation

## 2014-01-27 DIAGNOSIS — R1310 Dysphagia, unspecified: Secondary | ICD-10-CM | POA: Diagnosis present

## 2014-01-27 DIAGNOSIS — K210 Gastro-esophageal reflux disease with esophagitis: Secondary | ICD-10-CM | POA: Insufficient documentation

## 2014-01-27 DIAGNOSIS — I120 Hypertensive chronic kidney disease with stage 5 chronic kidney disease or end stage renal disease: Secondary | ICD-10-CM | POA: Insufficient documentation

## 2014-01-27 DIAGNOSIS — N186 End stage renal disease: Secondary | ICD-10-CM | POA: Insufficient documentation

## 2014-01-27 DIAGNOSIS — K222 Esophageal obstruction: Secondary | ICD-10-CM | POA: Insufficient documentation

## 2014-01-27 HISTORY — PX: ESOPHAGEAL DILATION: SHX303

## 2014-01-27 HISTORY — PX: ESOPHAGOGASTRODUODENOSCOPY: SHX5428

## 2014-01-27 SURGERY — EGD (ESOPHAGOGASTRODUODENOSCOPY)
Anesthesia: Moderate Sedation

## 2014-01-27 MED ORDER — BUTAMBEN-TETRACAINE-BENZOCAINE 2-2-14 % EX AERO
INHALATION_SPRAY | CUTANEOUS | Status: DC | PRN
  Administered 2014-01-27: 2 via TOPICAL

## 2014-01-27 MED ORDER — MIDAZOLAM HCL 10 MG/2ML IJ SOLN
INTRAMUSCULAR | Status: DC | PRN
  Administered 2014-01-27 (×3): 2 mg via INTRAVENOUS

## 2014-01-27 MED ORDER — DIPHENHYDRAMINE HCL 50 MG/ML IJ SOLN
INTRAMUSCULAR | Status: AC
Start: 2014-01-27 — End: 2014-01-27
  Filled 2014-01-27: qty 1

## 2014-01-27 MED ORDER — SODIUM CHLORIDE 0.9 % IV SOLN
INTRAVENOUS | Status: DC
Start: 2014-01-27 — End: 2014-01-27
  Administered 2014-01-27: 500 mL via INTRAVENOUS

## 2014-01-27 MED ORDER — FENTANYL CITRATE 0.05 MG/ML IJ SOLN
INTRAMUSCULAR | Status: AC
  Filled 2014-01-27: qty 4

## 2014-01-27 MED ORDER — MIDAZOLAM HCL 10 MG/2ML IJ SOLN
INTRAMUSCULAR | Status: AC
  Filled 2014-01-27: qty 4

## 2014-01-27 MED ORDER — FENTANYL CITRATE 0.05 MG/ML IJ SOLN
INTRAMUSCULAR | Status: DC | PRN
  Administered 2014-01-27 (×3): 25 ug via INTRAVENOUS

## 2014-01-27 NOTE — H&P (Signed)
   Brad [name] HPI: The patient reports issues with nausea and vomiting that started two months ago.  It was associated with weight loss and in his estimation he has lost 15 lbs.  Over the past year he has lost a total of 25 lbs.  In 05/2010 there is an encounter with Dr. Cristina Gong, but I cannot obtain any records.  Per the patient hd had an EGD with dilation in the past.  An esophagram was performed and it was significant for a small hiatal hernia with stricture.  The barium tablet lodged in that area, however, his complaint at that time was dysphagia.  He did not have nausea and vomiting.  His nausea and vomiting occurs with immediate PO intake or several hours afterwards.  There is no component of dysphagia.  Past Medical History  Diagnosis Date  . Hypertension   . High cholesterol   . Gout   . Anemia   . Renal disorder     End stage renal disease secondary to HTN nephrosclerosis  . Secondary hyperparathyroidism   . Hypocalcemia   . Colon polyps   . Paroxysmal supraventricular tachycardia   . Status post dilatation of esophageal stricture   . Syncope     Past Surgical History  Procedure Laterality Date  . Right arm amputation     . Revision of arteriovenous goretex graft Left 12/15/2012    Procedure: REVISION OF ARTERIOVENOUS GORETEX GRAFT using 25mm x 20cm Gortex graft;  Surgeon: Mal Misty, MD;  Location: [facility];  Service: Vascular;  Laterality: Left;    History reviewed. No pertinent family history.  Social History:  reports that he has never smoked. He does not have any smokeless tobacco history on file. He reports that he does not drink alcohol or use illicit drugs.  Allergies: No Known Allergies  Medications:  Scheduled:  Continuous: . sodium chloride 500 mL (01/27/14 0848)    No results found for this or any previous visit (from the past 24 hour(s)).   No results found.  ROS:  As stated above in the HPI otherwise negative.  Blood pressure 206/97, pulse 67,  temperature 97.7 F (36.5 C), temperature source Oral, resp. rate 12, height 5\' 11"  (1.803 m), weight 53.978 kg (119 lb), SpO2 99.00%.    PE: Gen: NAD, Alert and Oriented HEENT:  [place]/AT, EOMI Neck: Supple, no LAD Lungs: CTA Bilaterally CV: RRR without M/G/R ABM: Soft, NTND, +BS Ext: amputated right arm  Assessment/Plan: 1) Nausea/Vomiting. 2) Esophageal stricture.  Plan: 1) EGD with dilation.  Virlee Stroschein D 01/27/2014, 9:31 AM

## 2014-01-27 NOTE — Discharge Instructions (Signed)
Gastrointestinal Endoscopy, Care After Refer to this sheet in the next few weeks. These instructions provide you with information on caring for yourself after your procedure. Your caregiver may also give you more specific instructions. Your treatment has been planned according to current medical practices, but problems sometimes occur. Call your caregiver if you have any problems or questions after your procedure. HOME CARE INSTRUCTIONS  If you were given medicine to help you relax (sedative), do not drive, operate machinery, or sign important documents for 24 hours.  Avoid alcohol and hot or warm beverages for the first 24 hours after the procedure.  Only take over-the-counter or prescription medicines for pain, discomfort, or fever as directed by your caregiver. You may resume taking your normal medicines unless your caregiver tells you otherwise. Ask your caregiver when you may resume taking medicines that may cause bleeding, such as aspirin, clopidogrel, or warfarin.  You may return to your normal diet and activities on the day after your procedure, or as directed by your caregiver. Walking may help to reduce any bloated feeling in your abdomen.  Drink enough fluids to keep your urine clear or pale yellow.  You may gargle with salt water if you have a sore throat. SEEK IMMEDIATE MEDICAL CARE IF:  You have severe nausea or vomiting.  You have severe abdominal pain, abdominal cramps that last longer than 6 hours, or abdominal swelling (distention).  You have severe shoulder or back pain.  You have trouble swallowing.  You have shortness of breath, your breathing is shallow, or you are breathing faster than normal.  You have a fever or a rapid heartbeat.  You vomit blood or material that looks like coffee grounds.  You have bloody, black, or tarry stools. MAKE SURE YOU:  Understand these instructions.  Will watch your condition.  Will get help right away if you are not doing  well or get worse. Document Released: 11/06/2003 Document Revised: 08/08/2013 Document Reviewed: 06/24/2011 [facility] Patient Information 2015 [place], [place]. This information is not intended to replace advice given to you by your health care provider. Make sure you discuss any questions you have with your health care provider.   Conscious Sedation Sedation is the use of medicines to promote relaxation and relieve discomfort and anxiety. Conscious sedation is a type of sedation. Under conscious sedation you are less alert than normal but are still able to respond to instructions or stimulation. Conscious sedation is used during short medical and dental procedures. It is milder than deep sedation or general anesthesia and allows you to return to your regular activities sooner.  LET [facility] CARE PROVIDER KNOW ABOUT:   Any allergies you have.  All medicines you are taking, including vitamins, herbs, eye drops, creams, and over-the-counter medicines.  Use of steroids (by mouth or creams).  Previous problems you or members of your family have had with the use of anesthetics.  Any blood disorders you have.  Previous surgeries you have had.  Medical conditions you have.  Possibility of pregnancy, if this applies.  Use of cigarettes, alcohol, or illegal drugs. RISKS AND COMPLICATIONS Generally, this is a safe procedure. However, as with any procedure, problems can occur. Possible problems include:  Oversedation.  Trouble breathing on your own. You may need to have a breathing tube until you are awake and breathing on your own.  Allergic reaction to any of the medicines used for the procedure. BEFORE THE PROCEDURE  You may have blood tests done. These tests can help  show how well your kidneys and liver are working. They can also show how well your blood clots.  A physical exam will be done.  Only take medicines as directed by your health care provider. You may need to stop taking  medicines (such as blood thinners, aspirin, or nonsteroidal anti-inflammatory drugs) before the procedure.   Do not eat or drink at least 6 hours before the procedure or as directed by your health care provider.  Arrange for a responsible adult, family member, or friend to take you home after the procedure. He or she should stay with you for at least 24 hours after the procedure, until the medicine has worn off. PROCEDURE   An intravenous (IV) catheter will be inserted into one of your veins. Medicine will be able to flow directly into your body through this catheter. You may be given medicine through this tube to help prevent pain and help you relax.  The medical or dental procedure will be done. AFTER THE PROCEDURE  You will stay in a recovery area until the medicine has worn off. Your blood pressure and pulse will be checked.   Depending on the procedure you had, you may be allowed to go home when you can tolerate liquids and your pain is under control. Document Released: 12/17/2000 Document Revised: 03/29/2013 Document Reviewed: 11/29/2012 [facility] Patient Information 2015 [place], [place]. This information is not intended to replace advice given to you by your health care provider. Make sure you discuss any questions you have with your health care provider.

## 2014-01-27 NOTE — Op Note (Signed)
[facility] [place], [address]   ENDOSCOPY PROCEDURE REPORT  PATIENT: Brad, [name]  MR#: [id number] BIRTHDATE: 03/30/1936 , 5  yrs. old GENDER: male ENDOSCOPIST:Jadarian Mckay Benson [place], MD REFERRED BY: PROCEDURE DATE:  Feb 19, 2014 PROCEDURE:   EGD w/ balloon dilation ASA CLASS:    Class III INDICATIONS: nausea, vomiting, and dysphagia. MEDICATION: Versed 6 mg IV and Fentanyl 75 mcg IV TOPICAL ANESTHETIC:   Cetacaine Spray  DESCRIPTION OF PROCEDURE:   After the risks and benefits of the procedure were explained, informed consent was obtained.  The endoscope was introduced through the mouth  and advanced to the second portion of the duodenum .  The instrument was slowly withdrawn as the mucosa was fully examined.  FINDINGS: In the distal esophagus there was a peptic stricture. Around the Z-line there was evidence of reflux esophagitis.  I was not certain, but there is the possibility of a Candidal esophagitis, however, he did receive cetacaine spray.  The stricture was traveresed and it caused a minor teaer in the mucosa. Further dilation was performed with a dilation balloon first at 15 mm and then 16.5 mm.  At 15 mm the balloon moved freely.  At 16.5 mm the balloon was more stationary and the expected mucosal tear was identified.  A small hiatal hernia measuring 2 cm was noted. In the gastric antrum a moderate GAVE was identified.  No abnormalities noted in the duodenum.          The scope was then withdrawn from the patient and the procedure completed. COMPLICATIONS: There were no immediate complications. ENDOSCOPIC IMPRESSION: 1) Peptic esophageal stricture s/p dilation. 2) LA Grade A esophagitis. 3) ? Mild Candidal esophagitis. 4) 2 cm hiatal hernia. 5) Nonbleeding GAVE. RECOMMENDATIONS: 1) Follow up in the office in 1 month. 2) Continue with PPI.  If no improvement, an emperic trial of fluconazole will be  tried. _______________________________ eSignedCarol Ada, MD 2014/02/19 10:01 AM   cc: CPT CODES: ICD CODES:  The ICD and CPT codes recommended by this software are interpretations from the data that the clinical staff has captured with the software.  The verification of the translation of this report to the ICD and CPT codes and modifiers is the sole responsibility of the health care institution and practicing physician where this report was generated.  [employer] will not be held responsible for the validity of the ICD and CPT codes included on this report.  AMA assumes no liability for data contained or not contained herein. CPT is a Designer, television/film set of the [employer].

## 2014-01-30 ENCOUNTER — Encounter (HOSPITAL_COMMUNITY): Payer: Self-pay | Admitting: Gastroenterology

## 2014-05-05 DIAGNOSIS — D689 Coagulation defect, unspecified: Secondary | ICD-10-CM | POA: Diagnosis not present

## 2014-05-05 DIAGNOSIS — D688 Other specified coagulation defects: Secondary | ICD-10-CM | POA: Diagnosis not present

## 2014-05-05 DIAGNOSIS — N186 End stage renal disease: Secondary | ICD-10-CM | POA: Diagnosis not present

## 2014-05-07 DIAGNOSIS — Z992 Dependence on renal dialysis: Secondary | ICD-10-CM | POA: Diagnosis not present

## 2014-05-07 DIAGNOSIS — N186 End stage renal disease: Secondary | ICD-10-CM | POA: Diagnosis not present

## 2014-06-05 DIAGNOSIS — Z992 Dependence on renal dialysis: Secondary | ICD-10-CM | POA: Diagnosis not present

## 2014-06-05 DIAGNOSIS — N186 End stage renal disease: Secondary | ICD-10-CM | POA: Diagnosis not present

## 2014-06-05 DIAGNOSIS — D688 Other specified coagulation defects: Secondary | ICD-10-CM | POA: Diagnosis not present

## 2014-06-05 DIAGNOSIS — R52 Pain, unspecified: Secondary | ICD-10-CM | POA: Diagnosis not present

## 2014-06-05 DIAGNOSIS — D689 Coagulation defect, unspecified: Secondary | ICD-10-CM | POA: Diagnosis not present

## 2014-07-05 DIAGNOSIS — N186 End stage renal disease: Secondary | ICD-10-CM | POA: Diagnosis not present

## 2014-07-05 DIAGNOSIS — R51 Headache: Secondary | ICD-10-CM | POA: Diagnosis not present

## 2014-07-05 DIAGNOSIS — D689 Coagulation defect, unspecified: Secondary | ICD-10-CM | POA: Diagnosis not present

## 2014-07-05 DIAGNOSIS — D688 Other specified coagulation defects: Secondary | ICD-10-CM | POA: Diagnosis not present

## 2014-07-06 DIAGNOSIS — Z992 Dependence on renal dialysis: Secondary | ICD-10-CM | POA: Diagnosis not present

## 2014-07-06 DIAGNOSIS — N186 End stage renal disease: Secondary | ICD-10-CM | POA: Diagnosis not present

## 2014-07-22 ENCOUNTER — Emergency Department (HOSPITAL_COMMUNITY): Payer: Medicare Other

## 2014-07-22 ENCOUNTER — Emergency Department (INDEPENDENT_AMBULATORY_CARE_PROVIDER_SITE_OTHER): Payer: Medicare Other

## 2014-07-22 ENCOUNTER — Emergency Department (HOSPITAL_COMMUNITY)
Admission: EM | Admit: 2014-07-22 | Discharge: 2014-07-22 | Disposition: A | Discharge status: Home / Self Care | Payer: Medicare Other | Source: Home / Self Care | Attending: Emergency Medicine | Admitting: Emergency Medicine

## 2014-07-22 ENCOUNTER — Encounter (HOSPITAL_COMMUNITY): Payer: Self-pay | Admitting: Emergency Medicine

## 2014-07-22 DIAGNOSIS — N186 End stage renal disease: Secondary | ICD-10-CM

## 2014-07-22 DIAGNOSIS — M25552 Pain in left hip: Secondary | ICD-10-CM

## 2014-07-22 DIAGNOSIS — I151 Hypertension secondary to other renal disorders: Secondary | ICD-10-CM

## 2014-07-22 DIAGNOSIS — M25511 Pain in right shoulder: Secondary | ICD-10-CM

## 2014-07-22 DIAGNOSIS — Z992 Dependence on renal dialysis: Secondary | ICD-10-CM

## 2014-07-22 DIAGNOSIS — N2889 Other specified disorders of kidney and ureter: Secondary | ICD-10-CM

## 2014-07-22 MED ORDER — TRAMADOL HCL 50 MG PO TABS: 50.0000 mg | ORAL_TABLET | Freq: Two times a day (BID) | ORAL | Status: DC | PRN

## 2014-07-22 NOTE — ED Provider Notes (Signed)
CSN: [id number]     Arrival date & time 07/22/14  0911 History   First MD Initiated Contact with Patient 07/22/14 [phone]     Chief Complaint  Patient presents with  . Shoulder Pain  . Hip Pain   (Consider location/radiation/quality/duration/timing/severity/associated sxs/prior Treatment) HPI He is a 79 year old man with end-stage renal disease on dialysis here for evaluation of right shoulder and left hip pain. He states they have been hurting for the last month. He describes the pain as an ache. He cannot pinpoint a specific location in either the shoulder or the hip, just states the whole joint hurts. The hip is worse with getting up or laying down in the bed. He has been taking Aleve with temporary improvement of pain. He denies any injury or trauma. No increase in activity level.  Past Medical History  Diagnosis Date  . Hypertension   . High cholesterol   . Gout   . Anemia   . Renal disorder     End stage renal disease secondary to HTN nephrosclerosis  . Secondary hyperparathyroidism   . Hypocalcemia   . Colon polyps   . Paroxysmal supraventricular tachycardia   . Status post dilatation of esophageal stricture   . Syncope    Past Surgical History  Procedure Laterality Date  . Right arm amputation     . Revision of arteriovenous goretex graft Left 12/15/2012    Procedure: REVISION OF ARTERIOVENOUS GORETEX GRAFT using 61mm x 20cm Gortex graft;  Surgeon: Mal Misty, MD;  Location: [facility];  Service: Vascular;  Laterality: Left;  . Esophagogastroduodenoscopy N/A 01/27/2014    Procedure: ESOPHAGOGASTRODUODENOSCOPY (EGD);  Surgeon: Beryle Beams, MD;  Location: [facility];  Service: Endoscopy;  Laterality: N/A;  . Esophageal dilation N/A 01/27/2014    Procedure: ESOPHAGEAL DILATION;  Surgeon: Beryle Beams, MD;  Location: [facility];  Service: Endoscopy;  Laterality: N/A;  Balloon Dilation   No family history on file. History  Substance Use Topics  . Smoking status: Never  Smoker   . Smokeless tobacco: Not on file  . Alcohol Use: No    Review of Systems  As in history of present illness  Allergies  Review of patient's allergies indicates no known allergies.  Home Medications   Prior to Admission medications   Medication Sig Start Date End Date Taking? Authorizing Provider  amLODipine (NORVASC) 10 MG tablet Take 10 mg by mouth at bedtime. For blood pressure    Yes Historical Provider, MD  aspirin 81 MG EC tablet Take 81 mg by mouth daily.     Yes Historical Provider, MD  atorvastatin (LIPITOR) 40 MG tablet Take 40 mg by mouth every evening.    Yes Historical Provider, MD  colchicine 0.6 MG tablet Take 0.6 mg by mouth 3 (three) times a week. Monday, wednesday, friday   Yes Historical Provider, MD  hydrALAZINE (APRESOLINE) 50 MG tablet Take 50 mg by mouth 2 (two) times daily.   Yes Historical Provider, MD  metoprolol (TOPROL-XL) 200 MG 24 hr tablet Take 200 mg by mouth at bedtime.    Yes Historical Provider, MD  omeprazole (PRILOSEC) 20 MG capsule Take 20 mg by mouth daily.     Yes Historical Provider, MD  oxyCODONE (ROXICODONE) 5 MG immediate release tablet Take 1 tablet (5 mg total) by mouth every 6 (six) hours as needed for severe pain. 06/04/13  Yes Domenic Moras, PA-C  Acetaminophen (TYLENOL PO) Take 2 tablets by mouth every 6 (six) hours as needed (  pain).    Historical Provider, MD  B Complex-C-Folic Acid (DIALYVITE TABLET) TABS Take 1 tablet by mouth daily.      Historical Provider, MD  calcium acetate (PHOSLO) 667 MG capsule Take 1,334 mg by mouth 3 (three) times daily with meals. And 1 cap with snack    Historical Provider, MD  oseltamivir (TAMIFLU) 30 MG capsule Take 1 capsule (30 mg total) by mouth See admin instructions. 06/08/13   Ruthell Rummage, MD  traMADol (ULTRAM) 50 MG tablet Take 1 tablet (50 mg total) by mouth every 12 (twelve) hours as needed for moderate pain or severe pain. 07/22/14   Melony Overly, MD  triamcinolone cream (KENALOG) 0.1 % Apply 1  application topically 2 (two) times daily.  05/17/13   Historical Provider, MD   BP 209/103 mmHg  Pulse 73  Temp(Src) 97.6 F (36.4 C) (Oral)  Resp 14  SpO2 100% Physical Exam  Constitutional: He is oriented to person, place, and time. No distress.  Extremely thin man.  Cardiovascular: Normal rate.   Pulmonary/Chest: Effort normal.  Musculoskeletal:  Right shoulder: His right arm is amputated. There is significant muscle wasting of all rotator cuff muscles. He has full range of motion of the stump. He is mildly tender over the coracoid process. Left hip: There is again significant muscle wasting. No point tenderness over left lower back, SI area, piriformis, or greater trochanter. No pain with internal or external rotation of the hip. He has 5 out of 5 strength in hip flexion and quadriceps.  Neurological: He is alert and oriented to person, place, and time.    ED Course  Procedures (including critical care time) Labs Review Labs Reviewed - No data to display  Imaging Review Dg Shoulder Right  07/22/2014   CLINICAL DATA:  79 year old male with 1 month history of right shoulder pain. No known injury. History of prior partial right arm amputation 1997  EXAM: RIGHT SHOULDER - 2+ VIEW  COMPARISON:  None.  FINDINGS: Well-healed amputation site proximal humeral diaphysis. No evidence of acute fracture or malalignment. Relative sclerosis in the posterior aspect of the femoral head which has a nonspecific appearance. The visualized thorax is within normal limits. No suspicious pulmonary nodule or mass. No evidence of rib fracture.  IMPRESSION: 1. Nonspecific sclerosis in the posterior aspect of the humeral head articular surface. Differential considerations include avascular necrosis (bone infarct), asymmetrical osteoporosis, or less likely a sclerotic metastasis. The imaging characteristics do not appear aggressive. If further imaging is clinically desired consider shoulder MRI. 2. Well-healed  proximal upper arm amputation site.   Electronically Signed   By: Jacqulynn Cadet M.D.   On: 07/22/2014 10:56   Dg Hip Unilat With Pelvis 2-3 Views Left  07/22/2014   CLINICAL DATA:  Left hip pain for 1 month, no known injury, initial encounter  EXAM: LEFT HIP (WITH PELVIS) 2-3 VIEWS  COMPARISON:  None.  FINDINGS: Pelvic ring is intact. Degenerative changes in the lower lumbar spine are seen. No acute fracture or dislocation is noted. No soft tissue changes are seen.  IMPRESSION: No acute abnormality noted.   Electronically Signed   By: Inez Catalina M.D.   On: 07/22/2014 10:51     MDM   1. Right shoulder pain   2. Left hip pain   3. Hypertension secondary to other renal disorders   4. ESRD (end stage renal disease)    His blood pressure is quite elevated, but he and his wife state this is typical  for him. He did take his blood pressure medications last night and this morning. Shoulder x-ray is concerning for avascular necrosis of the humeral head. Recommended continuing Aleve twice a day, his kidney doctor is aware of this medication. Add extra strength Tylenol 3 times a day. Prescription for tramadol provided to use twice a day as needed for severe pain. Recommended follow-up with orthopedics.    Melony Overly, MD 07/22/14 [phone]

## 2014-07-22 NOTE — Discharge Instructions (Signed)
Your bones are very thin. You do have some arthritis. One of the bones in your shoulder may not be getting adequate blood flow. This may be contributing to your pain. Continue to take Aleve twice a day. Make sure your kidney doctor is okay with this medication. Take extra strength Tylenol 2 tablets 3 times a day. Use the tramadol twice a day as needed for severe pain. Please make an appointment to see the orthopedic doctor in the next few weeks.

## 2014-07-22 NOTE — ED Notes (Signed)
C/o right shoulder pain onset 1 month; hx of right arm amputation Also c/o left hip pain onset 1 month Pain increases w/activity Denies inj/trauma Alert, on signs of acute distress.

## 2014-08-03 DIAGNOSIS — M5432 Sciatica, left side: Secondary | ICD-10-CM | POA: Diagnosis not present

## 2014-08-05 DIAGNOSIS — N186 End stage renal disease: Secondary | ICD-10-CM | POA: Diagnosis not present

## 2014-08-05 DIAGNOSIS — I120 Hypertensive chronic kidney disease with stage 5 chronic kidney disease or end stage renal disease: Secondary | ICD-10-CM | POA: Diagnosis not present

## 2014-08-05 DIAGNOSIS — Z992 Dependence on renal dialysis: Secondary | ICD-10-CM | POA: Diagnosis not present

## 2014-08-07 DIAGNOSIS — N2581 Secondary hyperparathyroidism of renal origin: Secondary | ICD-10-CM | POA: Diagnosis not present

## 2014-08-07 DIAGNOSIS — D631 Anemia in chronic kidney disease: Secondary | ICD-10-CM | POA: Diagnosis not present

## 2014-08-07 DIAGNOSIS — D689 Coagulation defect, unspecified: Secondary | ICD-10-CM | POA: Diagnosis not present

## 2014-08-07 DIAGNOSIS — D509 Iron deficiency anemia, unspecified: Secondary | ICD-10-CM | POA: Diagnosis not present

## 2014-08-07 DIAGNOSIS — R52 Pain, unspecified: Secondary | ICD-10-CM | POA: Diagnosis not present

## 2014-08-07 DIAGNOSIS — D688 Other specified coagulation defects: Secondary | ICD-10-CM | POA: Diagnosis not present

## 2014-08-07 DIAGNOSIS — R51 Headache: Secondary | ICD-10-CM | POA: Diagnosis not present

## 2014-08-07 DIAGNOSIS — N186 End stage renal disease: Secondary | ICD-10-CM | POA: Diagnosis not present

## 2014-08-07 DIAGNOSIS — S41101A Unspecified open wound of right upper arm, initial encounter: Secondary | ICD-10-CM | POA: Diagnosis not present

## 2014-08-07 DIAGNOSIS — L03039 Cellulitis of unspecified toe: Secondary | ICD-10-CM | POA: Diagnosis not present

## 2014-08-09 DIAGNOSIS — R52 Pain, unspecified: Secondary | ICD-10-CM | POA: Diagnosis not present

## 2014-08-09 DIAGNOSIS — D689 Coagulation defect, unspecified: Secondary | ICD-10-CM | POA: Diagnosis not present

## 2014-08-09 DIAGNOSIS — N186 End stage renal disease: Secondary | ICD-10-CM | POA: Diagnosis not present

## 2014-08-09 DIAGNOSIS — D688 Other specified coagulation defects: Secondary | ICD-10-CM | POA: Diagnosis not present

## 2014-08-09 DIAGNOSIS — D631 Anemia in chronic kidney disease: Secondary | ICD-10-CM | POA: Diagnosis not present

## 2014-08-09 DIAGNOSIS — R51 Headache: Secondary | ICD-10-CM | POA: Diagnosis not present

## 2014-08-09 DIAGNOSIS — D509 Iron deficiency anemia, unspecified: Secondary | ICD-10-CM | POA: Diagnosis not present

## 2014-08-09 DIAGNOSIS — S41101A Unspecified open wound of right upper arm, initial encounter: Secondary | ICD-10-CM | POA: Diagnosis not present

## 2014-08-09 DIAGNOSIS — N2581 Secondary hyperparathyroidism of renal origin: Secondary | ICD-10-CM | POA: Diagnosis not present

## 2014-08-09 DIAGNOSIS — L03039 Cellulitis of unspecified toe: Secondary | ICD-10-CM | POA: Diagnosis not present

## 2014-08-11 DIAGNOSIS — D688 Other specified coagulation defects: Secondary | ICD-10-CM | POA: Diagnosis not present

## 2014-08-11 DIAGNOSIS — N2581 Secondary hyperparathyroidism of renal origin: Secondary | ICD-10-CM | POA: Diagnosis not present

## 2014-08-11 DIAGNOSIS — D509 Iron deficiency anemia, unspecified: Secondary | ICD-10-CM | POA: Diagnosis not present

## 2014-08-11 DIAGNOSIS — D689 Coagulation defect, unspecified: Secondary | ICD-10-CM | POA: Diagnosis not present

## 2014-08-11 DIAGNOSIS — R52 Pain, unspecified: Secondary | ICD-10-CM | POA: Diagnosis not present

## 2014-08-11 DIAGNOSIS — R51 Headache: Secondary | ICD-10-CM | POA: Diagnosis not present

## 2014-08-11 DIAGNOSIS — L03039 Cellulitis of unspecified toe: Secondary | ICD-10-CM | POA: Diagnosis not present

## 2014-08-11 DIAGNOSIS — D631 Anemia in chronic kidney disease: Secondary | ICD-10-CM | POA: Diagnosis not present

## 2014-08-11 DIAGNOSIS — N186 End stage renal disease: Secondary | ICD-10-CM | POA: Diagnosis not present

## 2014-08-11 DIAGNOSIS — S41101A Unspecified open wound of right upper arm, initial encounter: Secondary | ICD-10-CM | POA: Diagnosis not present

## 2014-08-14 ENCOUNTER — Observation Stay (HOSPITAL_COMMUNITY)
Admission: EM | Admit: 2014-08-14 | Discharge: 2014-08-18 | Disposition: A | Discharge status: Home / Self Care | Payer: Medicare Other | Attending: Family Medicine | Admitting: Family Medicine

## 2014-08-14 ENCOUNTER — Encounter (HOSPITAL_COMMUNITY): Payer: Self-pay | Admitting: Cardiology

## 2014-08-14 DIAGNOSIS — K31819 Angiodysplasia of stomach and duodenum without bleeding: Secondary | ICD-10-CM | POA: Diagnosis not present

## 2014-08-14 DIAGNOSIS — Z79899 Other long term (current) drug therapy: Secondary | ICD-10-CM | POA: Insufficient documentation

## 2014-08-14 DIAGNOSIS — I120 Hypertensive chronic kidney disease with stage 5 chronic kidney disease or end stage renal disease: Secondary | ICD-10-CM | POA: Diagnosis not present

## 2014-08-14 DIAGNOSIS — R52 Pain, unspecified: Secondary | ICD-10-CM | POA: Diagnosis not present

## 2014-08-14 DIAGNOSIS — Z89201 Acquired absence of right upper limb, unspecified level: Secondary | ICD-10-CM | POA: Diagnosis not present

## 2014-08-14 DIAGNOSIS — M109 Gout, unspecified: Secondary | ICD-10-CM | POA: Diagnosis not present

## 2014-08-14 DIAGNOSIS — L97509 Non-pressure chronic ulcer of other part of unspecified foot with unspecified severity: Secondary | ICD-10-CM | POA: Insufficient documentation

## 2014-08-14 DIAGNOSIS — M25519 Pain in unspecified shoulder: Secondary | ICD-10-CM | POA: Diagnosis not present

## 2014-08-14 DIAGNOSIS — D649 Anemia, unspecified: Secondary | ICD-10-CM | POA: Diagnosis not present

## 2014-08-14 DIAGNOSIS — K922 Gastrointestinal hemorrhage, unspecified: Secondary | ICD-10-CM | POA: Diagnosis present

## 2014-08-14 DIAGNOSIS — N185 Chronic kidney disease, stage 5: Secondary | ICD-10-CM

## 2014-08-14 DIAGNOSIS — K921 Melena: Secondary | ICD-10-CM | POA: Diagnosis not present

## 2014-08-14 DIAGNOSIS — G8929 Other chronic pain: Secondary | ICD-10-CM | POA: Diagnosis not present

## 2014-08-14 DIAGNOSIS — N2581 Secondary hyperparathyroidism of renal origin: Secondary | ICD-10-CM | POA: Diagnosis not present

## 2014-08-14 DIAGNOSIS — D509 Iron deficiency anemia, unspecified: Secondary | ICD-10-CM | POA: Diagnosis not present

## 2014-08-14 DIAGNOSIS — N186 End stage renal disease: Secondary | ICD-10-CM | POA: Diagnosis not present

## 2014-08-14 DIAGNOSIS — R531 Weakness: Secondary | ICD-10-CM | POA: Insufficient documentation

## 2014-08-14 DIAGNOSIS — M25559 Pain in unspecified hip: Secondary | ICD-10-CM | POA: Diagnosis not present

## 2014-08-14 DIAGNOSIS — I10 Essential (primary) hypertension: Secondary | ICD-10-CM | POA: Diagnosis present

## 2014-08-14 DIAGNOSIS — Z992 Dependence on renal dialysis: Secondary | ICD-10-CM

## 2014-08-14 DIAGNOSIS — D688 Other specified coagulation defects: Secondary | ICD-10-CM | POA: Diagnosis not present

## 2014-08-14 DIAGNOSIS — K222 Esophageal obstruction: Secondary | ICD-10-CM | POA: Insufficient documentation

## 2014-08-14 DIAGNOSIS — I471 Supraventricular tachycardia: Secondary | ICD-10-CM | POA: Diagnosis not present

## 2014-08-14 DIAGNOSIS — K5730 Diverticulosis of large intestine without perforation or abscess without bleeding: Secondary | ICD-10-CM | POA: Insufficient documentation

## 2014-08-14 DIAGNOSIS — D631 Anemia in chronic kidney disease: Secondary | ICD-10-CM | POA: Diagnosis present

## 2014-08-14 DIAGNOSIS — L03039 Cellulitis of unspecified toe: Secondary | ICD-10-CM | POA: Diagnosis not present

## 2014-08-14 DIAGNOSIS — D689 Coagulation defect, unspecified: Secondary | ICD-10-CM | POA: Diagnosis not present

## 2014-08-14 DIAGNOSIS — S41101A Unspecified open wound of right upper arm, initial encounter: Secondary | ICD-10-CM | POA: Diagnosis not present

## 2014-08-14 DIAGNOSIS — E43 Unspecified severe protein-calorie malnutrition: Secondary | ICD-10-CM | POA: Diagnosis not present

## 2014-08-14 DIAGNOSIS — M25552 Pain in left hip: Secondary | ICD-10-CM

## 2014-08-14 DIAGNOSIS — R51 Headache: Secondary | ICD-10-CM | POA: Diagnosis not present

## 2014-08-14 LAB — CBC WITH DIFFERENTIAL/PLATELET
Basophils Absolute: 0 10*3/uL (ref 0.0–0.1)
Basophils Relative: 0 % (ref 0–1)
EOS ABS: 0.1 10*3/uL (ref 0.0–0.7)
EOS PCT: 1 % (ref 0–5)
HEMATOCRIT: 23.6 % — AB (ref 39.0–52.0)
HEMOGLOBIN: 7.8 g/dL — AB (ref 13.0–17.0)
Lymphocytes Relative: 20 % (ref 12–46)
Lymphs Abs: 1.5 10*3/uL (ref 0.7–4.0)
MCH: 30.2 pg (ref 26.0–34.0)
MCHC: 33.1 g/dL (ref 30.0–36.0)
MCV: 91.5 fL (ref 78.0–100.0)
MONO ABS: 0.8 10*3/uL (ref 0.1–1.0)
MONOS PCT: 11 % (ref 3–12)
Neutro Abs: 5.3 10*3/uL (ref 1.7–7.7)
Neutrophils Relative %: 68 % (ref 43–77)
Platelets: 292 10*3/uL (ref 150–400)
RBC: 2.58 MIL/uL — AB (ref 4.22–5.81)
RDW: 21 % — ABNORMAL HIGH (ref 11.5–15.5)
WBC: 7.7 10*3/uL (ref 4.0–10.5)

## 2014-08-14 LAB — ABO/RH: ABO/RH(D): AB POS

## 2014-08-14 LAB — CBC
HEMATOCRIT: 25.6 % — AB (ref 39.0–52.0)
HEMOGLOBIN: 8.5 g/dL — AB (ref 13.0–17.0)
MCH: 30.2 pg (ref 26.0–34.0)
MCHC: 33.2 g/dL (ref 30.0–36.0)
MCV: 91.1 fL (ref 78.0–100.0)
Platelets: 294 10*3/uL (ref 150–400)
RBC: 2.81 MIL/uL — AB (ref 4.22–5.81)
RDW: 20.8 % — ABNORMAL HIGH (ref 11.5–15.5)
WBC: 7.6 10*3/uL (ref 4.0–10.5)

## 2014-08-14 LAB — IRON AND TIBC
Iron: 65 ug/dL (ref 45–182)
Saturation Ratios: 23 % (ref 17.9–39.5)
TIBC: 280 ug/dL (ref 250–450)
UIBC: 215 ug/dL

## 2014-08-14 LAB — COMPREHENSIVE METABOLIC PANEL
ALBUMIN: 3.1 g/dL — AB (ref 3.5–5.0)
ALT: 11 U/L — AB (ref 17–63)
AST: 33 U/L (ref 15–41)
Alkaline Phosphatase: 104 U/L (ref 38–126)
Anion gap: 12 (ref 5–15)
BUN: 22 mg/dL — ABNORMAL HIGH (ref 6–20)
CALCIUM: 8.5 mg/dL — AB (ref 8.9–10.3)
CO2: 33 mmol/L — AB (ref 22–32)
Chloride: 95 mmol/L — ABNORMAL LOW (ref 101–111)
Creatinine, Ser: 4.74 mg/dL — ABNORMAL HIGH (ref 0.61–1.24)
GFR calc Af Amer: 13 mL/min — ABNORMAL LOW (ref >60)
GFR, EST NON AFRICAN AMERICAN: 11 mL/min — AB (ref >60)
Glucose, Bld: 87 mg/dL (ref 70–99)
Potassium: 3.8 mmol/L (ref 3.5–5.1)
SODIUM: 140 mmol/L (ref 135–145)
Total Bilirubin: 0.6 mg/dL (ref 0.3–1.2)
Total Protein: 6.8 g/dL (ref 6.5–8.1)

## 2014-08-14 LAB — PROTIME-INR
INR: 1.06 (ref 0.00–1.49)
PROTHROMBIN TIME: 13.9 s (ref 11.6–15.2)

## 2014-08-14 LAB — RETICULOCYTES
RBC.: 2.58 MIL/uL — ABNORMAL LOW (ref 4.22–5.81)
RETIC COUNT ABSOLUTE: 183.2 10*3/uL (ref 19.0–186.0)
RETIC CT PCT: 7.1 % — AB (ref 0.4–3.1)

## 2014-08-14 LAB — FERRITIN: FERRITIN: 125 ng/mL (ref 24–336)

## 2014-08-14 LAB — APTT: aPTT: 27 seconds (ref 24–37)

## 2014-08-14 MED ORDER — SODIUM CHLORIDE 0.9 % IJ SOLN
3.0000 mL | Freq: Two times a day (BID) | INTRAMUSCULAR | Status: DC
  Administered 2014-08-14 – 2014-08-17 (×6): 3 mL via INTRAVENOUS

## 2014-08-14 MED ORDER — ONDANSETRON HCL 4 MG PO TABS: 4.0000 mg | ORAL_TABLET | Freq: Four times a day (QID) | ORAL | Status: DC | PRN

## 2014-08-14 MED ORDER — CALCIUM ACETATE 667 MG PO CAPS: 1334.0000 mg | ORAL_CAPSULE | Freq: Three times a day (TID) | ORAL | Status: DC

## 2014-08-14 MED ORDER — OXYCODONE HCL 5 MG PO TABS
5.0000 mg | ORAL_TABLET | Freq: Four times a day (QID) | ORAL | Status: DC | PRN
  Administered 2014-08-14 – 2014-08-18 (×5): 5 mg via ORAL
  Filled 2014-08-14 (×5): qty 1

## 2014-08-14 MED ORDER — PANTOPRAZOLE SODIUM 40 MG IV SOLR
40.0000 mg | Freq: Two times a day (BID) | INTRAVENOUS | Status: DC
  Administered 2014-08-14 – 2014-08-17 (×7): 40 mg via INTRAVENOUS
  Filled 2014-08-14 (×10): qty 40

## 2014-08-14 MED ORDER — CALCIUM ACETATE (PHOS BINDER) 667 MG PO CAPS
1334.0000 mg | ORAL_CAPSULE | Freq: Three times a day (TID) | ORAL | Status: DC
  Filled 2014-08-14 (×3): qty 2

## 2014-08-14 MED ORDER — TRAMADOL-ACETAMINOPHEN 37.5-325 MG PO TABS
1.0000 | ORAL_TABLET | Freq: Three times a day (TID) | ORAL | Status: DC | PRN
  Administered 2014-08-15 – 2014-08-17 (×5): 1 via ORAL
  Filled 2014-08-14 (×5): qty 1

## 2014-08-14 MED ORDER — ACETAMINOPHEN 650 MG RE SUPP: 650.0000 mg | Freq: Four times a day (QID) | RECTAL | Status: DC | PRN

## 2014-08-14 MED ORDER — ATORVASTATIN CALCIUM 40 MG PO TABS
40.0000 mg | ORAL_TABLET | Freq: Every day | ORAL | Status: DC
  Administered 2014-08-15 – 2014-08-17 (×3): 40 mg via ORAL
  Filled 2014-08-14 (×5): qty 1

## 2014-08-14 MED ORDER — ONDANSETRON HCL 4 MG/2ML IJ SOLN: 4.0000 mg | Freq: Four times a day (QID) | INTRAMUSCULAR | Status: DC | PRN

## 2014-08-14 MED ORDER — AMLODIPINE BESYLATE 10 MG PO TABS
10.0000 mg | ORAL_TABLET | Freq: Every day | ORAL | Status: DC
  Administered 2014-08-14 – 2014-08-17 (×4): 10 mg via ORAL
  Filled 2014-08-14 (×5): qty 1

## 2014-08-14 MED ORDER — METOPROLOL SUCCINATE ER 100 MG PO TB24
200.0000 mg | ORAL_TABLET | Freq: Every day | ORAL | Status: DC
  Administered 2014-08-14 – 2014-08-17 (×4): 200 mg via ORAL
  Filled 2014-08-14 (×5): qty 2

## 2014-08-14 MED ORDER — HYDRALAZINE HCL 50 MG PO TABS
50.0000 mg | ORAL_TABLET | Freq: Two times a day (BID) | ORAL | Status: DC
  Administered 2014-08-14 – 2014-08-17 (×5): 50 mg via ORAL
  Filled 2014-08-14 (×9): qty 1

## 2014-08-14 MED ORDER — ACETAMINOPHEN 325 MG PO TABS: 650.0000 mg | ORAL_TABLET | Freq: Four times a day (QID) | ORAL | Status: DC | PRN

## 2014-08-14 NOTE — ED Notes (Signed)
PER BLOOD BANK: the pt is a Neurosurgeon witness but states he will receive blood if needed. If pt does need blood he will have to have to signed consent or refusal sent to blood bank because of his status as a Brad [name] witness

## 2014-08-14 NOTE — ED Notes (Signed)
Pt reports that he was sent from dialysis because he was having pain all over and noticed blood in his stool. Pt states he sat for his whole dialysis treatment.

## 2014-08-14 NOTE — H&P (Signed)
[facility] History and Physical  Patient: Brad [name]  MRN: [id number]  DOB: [birth year]  DOS: the patient was seen and examined on 08/14/2014 PCP: No primary care provider on file. patient mentions Dr. Willey Blade is her PCP. As per documentation Dr. Karlton Lemon no longer sees patient; PCP status ended per office request.  Referring physician: Fredia Sorrow, MD Chief Complaint: Fatigue abnormal lab  HPI: Brad [name] is a 79 y.o. male with Past medical history of ESRD on hemodialysis Monday Wednesday Friday, essential hypertension, history of gout, history of anemia secondary to renal disease, esophageal stricture, right arm amputation, malnutrition. The patient is presenting with complaints of generalized fatigue abnormal labs. The patient goes for hemodialysis on a regular Monday Wednesday Friday basis. He has been having regular black color bowel movements without any diarrhea. He was requested by the dialysis center to bring back FOBT cards, which were positive with anemia and therefore he was recommended to go to the hospital for further workup. At the time of my evaluation patient complains of generalized fatigue and weakness ongoing since last few weeks. He does not have any complaint of headache, blurred vision, nausea or vomiting, abdominal pain, diarrhea, shortness of breath or chest pain or cough. He complains of left hip pain as well as right shoulder pain for which she was seen earlier in ER on 4/16. He was started on Aleve as well as Tylenol Extra Strength and tramadol. She mentions she is not taking Aleve anymore. He also mentions he was given medication to take 6 tablets on day 1, 5 tablets on day 2, which I presume would be a prednisone taper. He is taking colchicine on a regular scheduled basis. He does have lower extremity open sores which are chronic and he was placed on doxycycline for the same earlier but he mentions that his kidney doctor took him off  off it and placed him on some cream. He also mentions he cannot gain weight despite having adequate diet. Is not using any dietary supplements.  The patient is coming from home And at his baseline independent for most of his ADL.  Review of Systems: as mentioned in the history of present illness.  A comprehensive review of the other systems is negative.  Past Medical History  Diagnosis Date  . Hypertension   . High cholesterol   . Gout   . Anemia   . Renal disorder     End stage renal disease secondary to HTN nephrosclerosis  . Secondary hyperparathyroidism   . Hypocalcemia   . Colon polyps   . Paroxysmal supraventricular tachycardia   . Status post dilatation of esophageal stricture   . Syncope    Past Surgical History  Procedure Laterality Date  . Right arm amputation     . Revision of arteriovenous goretex graft Left 12/15/2012    Procedure: REVISION OF ARTERIOVENOUS GORETEX GRAFT using 65mm x 20cm Gortex graft;  Surgeon: Mal Misty, MD;  Location: [facility];  Service: Vascular;  Laterality: Left;  . Esophagogastroduodenoscopy N/A 01/27/2014    Procedure: ESOPHAGOGASTRODUODENOSCOPY (EGD);  Surgeon: Beryle Beams, MD;  Location: [facility];  Service: Endoscopy;  Laterality: N/A;  . Esophageal dilation N/A 01/27/2014    Procedure: ESOPHAGEAL DILATION;  Surgeon: Beryle Beams, MD;  Location: [facility];  Service: Endoscopy;  Laterality: N/A;  Balloon Dilation   Social History:  reports that he has never smoked. He does not have any smokeless tobacco history on file. He reports that he  does not drink alcohol or use illicit drugs.  No Known Allergies  History reviewed. No pertinent family history.  Prior to Admission medications   Medication Sig Start Date End Date Taking? Authorizing Provider  acetaminophen (TYLENOL) 500 MG tablet Take 1,000 mg by mouth every 6 (six) hours as needed for mild pain or moderate pain.   Yes Historical Provider, MD  amLODipine (NORVASC) 10 MG  tablet Take 10 mg by mouth at bedtime. For blood pressure    Yes Historical Provider, MD  atorvastatin (LIPITOR) 40 MG tablet Take 40 mg by mouth daily.    Yes Historical Provider, MD  B Complex-C-Folic Acid (DIALYVITE TABLET) TABS Take 1 tablet by mouth daily.     Yes Historical Provider, MD  calcium acetate (PHOSLO) 667 MG capsule Take 1,334 mg by mouth 3 (three) times daily with meals. And 1 cap with snack   Yes Historical Provider, MD  colchicine 0.6 MG tablet Take 0.6 mg by mouth 3 (three) times a week. Monday, wednesday, friday   Yes Historical Provider, MD  doxycycline (VIBRAMYCIN) 100 MG capsule Take 100 mg by mouth 2 (two) times daily. 08/11/14  Yes Historical Provider, MD  hydrALAZINE (APRESOLINE) 50 MG tablet Take 50 mg by mouth 2 (two) times daily.   Yes Historical Provider, MD  metoprolol (TOPROL-XL) 200 MG 24 hr tablet Take 200 mg by mouth at bedtime.    Yes Historical Provider, MD  omeprazole (PRILOSEC) 20 MG capsule Take 20 mg by mouth daily.     Yes Historical Provider, MD  oxyCODONE (ROXICODONE) 5 MG immediate release tablet Take 1 tablet (5 mg total) by mouth every 6 (six) hours as needed for severe pain. 06/04/13  Yes Domenic Moras, PA-C  traMADol (ULTRAM) 50 MG tablet Take 1 tablet (50 mg total) by mouth every 12 (twelve) hours as needed for moderate pain or severe pain. 07/22/14  Yes Melony Overly, MD  triamcinolone cream (KENALOG) 0.1 % Apply 1 application topically 2 (two) times daily.  05/17/13  Yes Historical Provider, MD  oseltamivir (TAMIFLU) 30 MG capsule Take 1 capsule (30 mg total) by mouth See admin instructions. Patient not taking: Reported on 08/14/2014 06/08/13   Ruthell Rummage, MD    Physical Exam: Filed Vitals:   08/14/14 1800 08/14/14 1830 08/14/14 1900 08/14/14 1930  BP: 147/63 159/68 154/65 154/70  Pulse: 70 71 70 72  Temp:      Resp: 14 15 14 16   Weight:      SpO2: 100% 100% 98% 100%    General: Alert, Awake and Oriented to Time, Place and Person. Appear in mild  distress Eyes: PERRL ENT: Oral Mucosa clear moist, pallor. Neck: no JVD Cardiovascular: S1 and S2 Present, aortic systolic Murmur, Peripheral Pulses Present Respiratory: Bilateral Air entry equal and Decreased,  Clear to Auscultation, no Crackles, no wheezes Abdomen: Bowel Sound present, Soft and non tender Skin: Multiple chronic open small maculopapular Rash Extremities: no Pedal edema, no calf tenderness Neurologic: Grossly no focal neuro deficit.  Labs on Admission:  CBC:  Recent Labs Lab 08/14/14 1643  WBC 7.6  HGB 8.5*  HCT 25.6*  MCV 91.1  PLT 294    CMP     Component Value Date/Time   NA 140 08/14/2014 1643   K 3.8 08/14/2014 1643   CL 95* 08/14/2014 1643   CO2 33* 08/14/2014 1643   GLUCOSE 87 08/14/2014 1643   BUN 22* 08/14/2014 1643   CREATININE 4.74* 08/14/2014 1643   CREATININE 9.26* 07/09/2010  1205   CALCIUM 8.5* 08/14/2014 1643   PROT 6.8 08/14/2014 1643   ALBUMIN 3.1* 08/14/2014 1643   AST 33 08/14/2014 1643   ALT 11* 08/14/2014 1643   ALKPHOS 104 08/14/2014 1643   BILITOT 0.6 08/14/2014 1643   GFRNONAA 11* 08/14/2014 1643   GFRAA 13* 08/14/2014 1643    No results for input(s): LIPASE, AMYLASE in the last 168 hours.  No results for input(s): CKTOTAL, CKMB, CKMBINDEX, TROPONINI in the last 168 hours. BNP (last 3 results) No results for input(s): BNP in the last 8760 hours.  ProBNP (last 3 results) No results for input(s): PROBNP in the last 8760 hours.   Radiological Exams on Admission: No results found.  Assessment/Plan Principal Problem:   GI bleed Active Problems:   HYPERTENSION, BENIGN SYSTEMIC   CHRONIC KIDNEY DISEASE STAGE V   ESRD on dialysis   Anemia of chronic renal failure, stage 5   1. GI bleed The patient is presenting with complaints of fatigue. He had outpatient FOBT which was positive. He also had anemia. His dialysis center ask him to go to the ER. He mentions he has black color bowel movements regularly. He  denies any recent diarrhea. Probable etiology is upper GI bleeding, possibility of colchicine-induced diarrhea, Aleve and prednisone-induced gastritis cannot be ruled out. With this I would keep the patient on nothing by mouth except ice chips. Protonix every 12 hours. Recheck H&H as well as iron panel. We will consult GI in the morning. He does not appear to be hemodynamically unstable at present.  Patient is a Sales promotion account executive Witness but mentions to me that he will take blood if he needs to.  2.Chronic hip and shoulder pain. The patient is having hip and shoulder pain since last 2 months. Shoulder is showing signs of avascular necrosis on x-ray. Patient has been on NSAIDs. At present in view of possible GI bleed as well as gastritis I will hold off on them and place the patient on Ultracet with continuation of his home OxyIR.  3.Multiple small ulcers on the lower leg. Does not appear to be infected at present. Unsure of duration of the doxycycline. Started on 08/11/2014 as per documentation from pharmacy. Currently holding off on it at present.  4.ESRD on hemodialysis. Monday Wednesday Friday hemodialysis. Continuing renal supplements.  5.Possible protein calorie malnutrition. Will need dietitian consultation  Advance goals of care discussion: Full code as per my discussion with patient. Patient is a Sales promotion account executive Witness but mentions to me that he will take blood if he needs to. Will need to verify this with family if he needs blood transfusion.  DVT Prophylaxis: mechanical compression device  Nutrition: Nothing by mouth except medications  Disposition: Admitted as inpatient, telemetry unit.  Author: Berle Mull, MD [facility] Pager: [phone] 08/14/2014  If 7PM-7AM, please contact night-coverage [email or web address] Password [email or web address]

## 2014-08-14 NOTE — Progress Notes (Signed)
Admission note:  Arrival Method: Pt arrived on stretcher from ED with nurse tech Mental Orientation: Alert and oriented x 4 Telemetry: Telemetry box 6E06 applied. CCMD notified Assessment: Completed, see doc flowsheets Skin: Multiple sores to lower extremities and buttock bilaterally. Foam dressing applied. See flowsheets for detailed assessment IV: Left lower leg IV. Clean, dry, and intact.  Pain: Pt states pain is 5/10 chronic pain to right shoulder and left hip. Pain medication administered  Tubes: AV fistula to left arm. Thrill and bruit present  Safety Measures: Non-slip socks placed, bed in lowest position, call light within reach Fall Prevention Safety Plan: Reviewed with patient Admission Screening: Completed 6700 Orientation: Patient has been oriented to the unit, staff and to the room. Patient lying comfortably in bed with no needs stated at this time. Call light within reach, will continue to monitor.   Shelbie Hutching, RN, BSN

## 2014-08-14 NOTE — ED Provider Notes (Signed)
CSN: [id number]     Arrival date & time 08/14/14  1357 History   First MD Initiated Contact with Patient 08/14/14 1612     Chief Complaint  Patient presents with  . Pain  . Blood In Stools     (Consider location/radiation/quality/duration/timing/severity/associated sxs/prior Treatment) The history is provided by the patient.   a 79 year old male referred in from dialysis for heme positive stool. Patient did complete dialysis today normally his days are Monday Wednesday and Friday. Patient without any specific symptoms. No vomiting of any blood no red blood but does stools were guaiac positive he was supposed to bring in cards. Stool is normally black in color patient claims that that is normal but I do not see that he's on iron on a regular basis.  Past Medical History  Diagnosis Date  . Hypertension   . High cholesterol   . Gout   . Anemia   . Renal disorder     End stage renal disease secondary to HTN nephrosclerosis  . Secondary hyperparathyroidism   . Hypocalcemia   . Colon polyps   . Paroxysmal supraventricular tachycardia   . Status post dilatation of esophageal stricture   . Syncope    Past Surgical History  Procedure Laterality Date  . Right arm amputation     . Revision of arteriovenous goretex graft Left 12/15/2012    Procedure: REVISION OF ARTERIOVENOUS GORETEX GRAFT using 25mm x 20cm Gortex graft;  Surgeon: Mal Misty, MD;  Location: [facility];  Service: Vascular;  Laterality: Left;  . Esophagogastroduodenoscopy N/A 01/27/2014    Procedure: ESOPHAGOGASTRODUODENOSCOPY (EGD);  Surgeon: Beryle Beams, MD;  Location: [facility];  Service: Endoscopy;  Laterality: N/A;  . Esophageal dilation N/A 01/27/2014    Procedure: ESOPHAGEAL DILATION;  Surgeon: Beryle Beams, MD;  Location: [facility];  Service: Endoscopy;  Laterality: N/A;  Balloon Dilation   History reviewed. No pertinent family history. History  Substance Use Topics  . Smoking status: Never Smoker   .  Smokeless tobacco: Not on file  . Alcohol Use: No    Review of Systems  Constitutional: Negative for fever.  HENT: Negative for congestion.   Eyes: Negative for visual disturbance.  Respiratory: Negative for shortness of breath.   Cardiovascular: Negative for chest pain.  Gastrointestinal: Positive for blood in stool. Negative for nausea, vomiting, abdominal pain and diarrhea.  Musculoskeletal: Negative for back pain.  Skin: Negative for rash.  Neurological: Negative for headaches.  Hematological: Does not bruise/bleed easily.  Psychiatric/Behavioral: Negative for confusion.      Allergies  Review of patient's allergies indicates no known allergies.  Home Medications   Prior to Admission medications   Medication Sig Start Date End Date Taking? Authorizing Provider  acetaminophen (TYLENOL) 500 MG tablet Take 1,000 mg by mouth every 6 (six) hours as needed for mild pain or moderate pain.   Yes Historical Provider, MD  amLODipine (NORVASC) 10 MG tablet Take 10 mg by mouth at bedtime. For blood pressure    Yes Historical Provider, MD  atorvastatin (LIPITOR) 40 MG tablet Take 40 mg by mouth daily.    Yes Historical Provider, MD  B Complex-C-Folic Acid (DIALYVITE TABLET) TABS Take 1 tablet by mouth daily.     Yes Historical Provider, MD  calcium acetate (PHOSLO) 667 MG capsule Take 1,334 mg by mouth 3 (three) times daily with meals. And 1 cap with snack   Yes Historical Provider, MD  colchicine 0.6 MG tablet Take 0.6 mg by mouth  3 (three) times a week. Monday, wednesday, friday   Yes Historical Provider, MD  doxycycline (VIBRAMYCIN) 100 MG capsule Take 100 mg by mouth 2 (two) times daily. 08/11/14  Yes Historical Provider, MD  hydrALAZINE (APRESOLINE) 50 MG tablet Take 50 mg by mouth 2 (two) times daily.   Yes Historical Provider, MD  metoprolol (TOPROL-XL) 200 MG 24 hr tablet Take 200 mg by mouth at bedtime.    Yes Historical Provider, MD  omeprazole (PRILOSEC) 20 MG capsule Take 20 mg  by mouth daily.     Yes Historical Provider, MD  oxyCODONE (ROXICODONE) 5 MG immediate release tablet Take 1 tablet (5 mg total) by mouth every 6 (six) hours as needed for severe pain. 06/04/13  Yes Domenic Moras, PA-C  traMADol (ULTRAM) 50 MG tablet Take 1 tablet (50 mg total) by mouth every 12 (twelve) hours as needed for moderate pain or severe pain. 07/22/14  Yes Melony Overly, MD  triamcinolone cream (KENALOG) 0.1 % Apply 1 application topically 2 (two) times daily.  05/17/13  Yes Historical Provider, MD  oseltamivir (TAMIFLU) 30 MG capsule Take 1 capsule (30 mg total) by mouth See admin instructions. Patient not taking: Reported on 08/14/2014 06/08/13   Ruthell Rummage, MD   BP 147/63 mmHg  Pulse 70  Temp(Src) 97.7 F (36.5 C)  Resp 14  Wt 119 lb (53.978 kg)  SpO2 100% Physical Exam  Constitutional: He is oriented to person, place, and time. He appears well-developed and well-nourished. No distress.  HENT:  Head: Normocephalic and atraumatic.  Mouth/Throat: Oropharynx is clear and moist.  Eyes: Conjunctivae and EOM are normal. Pupils are equal, round, and reactive to light.  Neck: Normal range of motion.  Cardiovascular: Normal rate, regular rhythm and normal heart sounds.   No murmur heard. Pulmonary/Chest: Effort normal and breath sounds normal. No respiratory distress.  Abdominal: Soft. Bowel sounds are normal. There is no tenderness.  Musculoskeletal: Normal range of motion.  Patient with the right arm amputation. AV fistula for dialysis in the left arm.  Neurological: He is alert and oriented to person, place, and time. No cranial nerve deficit. He exhibits normal muscle tone. Coordination normal.  Skin: Skin is warm. No rash noted.  Nursing note and vitals reviewed.   ED Course  Procedures (including critical care time) Labs Review Labs Reviewed  CBC - Abnormal; Notable for the following:    RBC 2.81 (*)    Hemoglobin 8.5 (*)    HCT 25.6 (*)    RDW 20.8 (*)    All other  components within normal limits  COMPREHENSIVE METABOLIC PANEL - Abnormal; Notable for the following:    Chloride 95 (*)    CO2 33 (*)    BUN 22 (*)    Creatinine, Ser 4.74 (*)    Calcium 8.5 (*)    Albumin 3.1 (*)    ALT 11 (*)    GFR calc non Af Amer 11 (*)    GFR calc Af Amer 13 (*)    All other components within normal limits  TYPE AND SCREEN  ABO/RH   Results for orders placed or performed during the hospital encounter of 08/14/14  CBC  Result Value Ref Range   WBC 7.6 4.0 - 10.5 K/uL   RBC 2.81 (L) 4.22 - 5.81 MIL/uL   Hemoglobin 8.5 (L) 13.0 - 17.0 g/dL   HCT 25.6 (L) 39.0 - 52.0 %   MCV 91.1 78.0 - 100.0 fL   MCH 30.2 26.0 -  34.0 pg   MCHC 33.2 30.0 - 36.0 g/dL   RDW 20.8 (H) 11.5 - 15.5 %   Platelets 294 150 - 400 K/uL  Comprehensive metabolic panel  Result Value Ref Range   Sodium 140 135 - 145 mmol/L   Potassium 3.8 3.5 - 5.1 mmol/L   Chloride 95 (L) 101 - 111 mmol/L   CO2 33 (H) 22 - 32 mmol/L   Glucose, Bld 87 70 - 99 mg/dL   BUN 22 (H) 6 - 20 mg/dL   Creatinine, Ser 4.74 (H) 0.61 - 1.24 mg/dL   Calcium 8.5 (L) 8.9 - 10.3 mg/dL   Total Protein 6.8 6.5 - 8.1 g/dL   Albumin 3.1 (L) 3.5 - 5.0 g/dL   AST 33 15 - 41 U/L   ALT 11 (L) 17 - 63 U/L   Alkaline Phosphatase 104 38 - 126 U/L   Total Bilirubin 0.6 0.3 - 1.2 mg/dL   GFR calc non Af Amer 11 (L) >60 mL/min   GFR calc Af Amer 13 (L) >60 mL/min   Anion gap 12 5 - 15  Type and screen for Red Blood Exchange  Result Value Ref Range   ABO/RH(D) AB POS    Antibody Screen NEG    Sample Expiration 08/17/2014   ABO/Rh  Result Value Ref Range   ABO/RH(D) AB POS      Imaging Review No results found.   EKG Interpretation None      MDM   Final diagnoses:  ESRD on dialysis  Gastrointestinal hemorrhage with melena    Patient referred in from dialysis were heme positive guaiac stool. Patient's stools normally black in color according to patient and the friend. Patient did complete dialysis today.  Hemoglobin is 8.5 don't have any recent comparison. Patient will be admitted for observation. Patient in no acute distress.    Fredia Sorrow, MD 08/14/14 2008

## 2014-08-15 ENCOUNTER — Encounter (HOSPITAL_COMMUNITY)
Admission: EM | Disposition: A | Discharge status: Home / Self Care | Payer: Self-pay | Source: Home / Self Care | Attending: Emergency Medicine

## 2014-08-15 ENCOUNTER — Encounter (HOSPITAL_COMMUNITY): Payer: Self-pay | Admitting: *Deleted

## 2014-08-15 DIAGNOSIS — K921 Melena: Secondary | ICD-10-CM | POA: Diagnosis not present

## 2014-08-15 DIAGNOSIS — Z992 Dependence on renal dialysis: Secondary | ICD-10-CM | POA: Diagnosis not present

## 2014-08-15 DIAGNOSIS — E43 Unspecified severe protein-calorie malnutrition: Secondary | ICD-10-CM | POA: Insufficient documentation

## 2014-08-15 DIAGNOSIS — D631 Anemia in chronic kidney disease: Secondary | ICD-10-CM | POA: Diagnosis not present

## 2014-08-15 DIAGNOSIS — N186 End stage renal disease: Secondary | ICD-10-CM | POA: Diagnosis not present

## 2014-08-15 DIAGNOSIS — K31819 Angiodysplasia of stomach and duodenum without bleeding: Secondary | ICD-10-CM | POA: Diagnosis not present

## 2014-08-15 DIAGNOSIS — N185 Chronic kidney disease, stage 5: Secondary | ICD-10-CM | POA: Diagnosis not present

## 2014-08-15 DIAGNOSIS — D500 Iron deficiency anemia secondary to blood loss (chronic): Secondary | ICD-10-CM | POA: Diagnosis not present

## 2014-08-15 HISTORY — PX: ESOPHAGOGASTRODUODENOSCOPY: SHX5428

## 2014-08-15 LAB — CBC
HCT: 26.6 % — ABNORMAL LOW (ref 39.0–52.0)
Hemoglobin: 8.6 g/dL — ABNORMAL LOW (ref 13.0–17.0)
MCH: 29.8 pg (ref 26.0–34.0)
MCHC: 32.3 g/dL (ref 30.0–36.0)
MCV: 92 fL (ref 78.0–100.0)
PLATELETS: 285 10*3/uL (ref 150–400)
RBC: 2.89 MIL/uL — ABNORMAL LOW (ref 4.22–5.81)
RDW: 20.7 % — AB (ref 11.5–15.5)
WBC: 7.5 10*3/uL (ref 4.0–10.5)

## 2014-08-15 LAB — RENAL FUNCTION PANEL
ALBUMIN: 3.2 g/dL — AB (ref 3.5–5.0)
ANION GAP: 15 (ref 5–15)
Albumin: 2.9 g/dL — ABNORMAL LOW (ref 3.5–5.0)
Anion gap: 18 — ABNORMAL HIGH (ref 5–15)
BUN: 34 mg/dL — ABNORMAL HIGH (ref 6–20)
BUN: 46 mg/dL — ABNORMAL HIGH (ref 6–20)
CALCIUM: 8.9 mg/dL (ref 8.9–10.3)
CHLORIDE: 95 mmol/L — AB (ref 101–111)
CO2: 30 mmol/L (ref 22–32)
CO2: 27 mmol/L (ref 22–32)
Calcium: 8.7 mg/dL — ABNORMAL LOW (ref 8.9–10.3)
Chloride: 93 mmol/L — ABNORMAL LOW (ref 101–111)
Creatinine, Ser: 6.29 mg/dL — ABNORMAL HIGH (ref 0.61–1.24)
Creatinine, Ser: 7.54 mg/dL — ABNORMAL HIGH (ref 0.61–1.24)
GFR calc Af Amer: 9 mL/min — ABNORMAL LOW (ref >60)
GFR calc Af Amer: 7 mL/min — ABNORMAL LOW (ref >60)
GFR calc non Af Amer: 8 mL/min — ABNORMAL LOW (ref >60)
GFR calc non Af Amer: 6 mL/min — ABNORMAL LOW (ref >60)
GLUCOSE: 82 mg/dL (ref 70–99)
Glucose, Bld: 133 mg/dL — ABNORMAL HIGH (ref 70–99)
PHOSPHORUS: 4.7 mg/dL — AB (ref 2.5–4.6)
PHOSPHORUS: 5.8 mg/dL — AB (ref 2.5–4.6)
POTASSIUM: 4.4 mmol/L (ref 3.5–5.1)
POTASSIUM: 5.2 mmol/L — AB (ref 3.5–5.1)
SODIUM: 138 mmol/L (ref 135–145)
Sodium: 140 mmol/L (ref 135–145)

## 2014-08-15 LAB — CBC WITH DIFFERENTIAL/PLATELET
BASOS PCT: 0 % (ref 0–1)
Basophils Absolute: 0 10*3/uL (ref 0.0–0.1)
Eosinophils Absolute: 0.1 10*3/uL (ref 0.0–0.7)
Eosinophils Relative: 2 % (ref 0–5)
HEMATOCRIT: 25.2 % — AB (ref 39.0–52.0)
HEMOGLOBIN: 8.2 g/dL — AB (ref 13.0–17.0)
LYMPHS ABS: 1.1 10*3/uL (ref 0.7–4.0)
Lymphocytes Relative: 18 % (ref 12–46)
MCH: 29.6 pg (ref 26.0–34.0)
MCHC: 32.5 g/dL (ref 30.0–36.0)
MCV: 91 fL (ref 78.0–100.0)
MONO ABS: 0.8 10*3/uL (ref 0.1–1.0)
MONOS PCT: 14 % — AB (ref 3–12)
NEUTROS ABS: 4.1 10*3/uL (ref 1.7–7.7)
Neutrophils Relative %: 66 % (ref 43–77)
Platelets: 283 10*3/uL (ref 150–400)
RBC: 2.77 MIL/uL — ABNORMAL LOW (ref 4.22–5.81)
RDW: 20.7 % — ABNORMAL HIGH (ref 11.5–15.5)
WBC: 6.2 10*3/uL (ref 4.0–10.5)

## 2014-08-15 LAB — MRSA PCR SCREENING: MRSA BY PCR: NEGATIVE

## 2014-08-15 SURGERY — EGD (ESOPHAGOGASTRODUODENOSCOPY)
Anesthesia: Moderate Sedation

## 2014-08-15 MED ORDER — ALTEPLASE 2 MG IJ SOLR
2.0000 mg | Freq: Once | INTRAMUSCULAR | Status: AC | PRN
  Filled 2014-08-15: qty 2

## 2014-08-15 MED ORDER — FENTANYL CITRATE (PF) 100 MCG/2ML IJ SOLN
INTRAMUSCULAR | Status: AC
  Filled 2014-08-15: qty 4

## 2014-08-15 MED ORDER — LIDOCAINE-PRILOCAINE 2.5-2.5 % EX CREA: 1.0000 "application " | TOPICAL_CREAM | CUTANEOUS | Status: DC | PRN

## 2014-08-15 MED ORDER — LIDOCAINE HCL (PF) 1 % IJ SOLN: 5.0000 mL | INTRAMUSCULAR | Status: DC | PRN

## 2014-08-15 MED ORDER — FENTANYL CITRATE (PF) 100 MCG/2ML IJ SOLN
INTRAMUSCULAR | Status: DC | PRN
  Administered 2014-08-15: 25 ug via INTRAVENOUS

## 2014-08-15 MED ORDER — SODIUM CHLORIDE 0.9 % IV SOLN
INTRAVENOUS | Status: DC
  Administered 2014-08-15: 500 mL via INTRAVENOUS

## 2014-08-15 MED ORDER — NEPRO/CARBSTEADY PO LIQD: 237.0000 mL | ORAL | Status: DC | PRN

## 2014-08-15 MED ORDER — DIPHENHYDRAMINE HCL 50 MG/ML IJ SOLN
INTRAMUSCULAR | Status: AC
  Filled 2014-08-15: qty 1

## 2014-08-15 MED ORDER — PEG 3350-KCL-NA BICARB-NACL 420 G PO SOLR
4000.0000 mL | Freq: Once | ORAL | Status: AC
  Administered 2014-08-16: 4000 mL via ORAL
  Filled 2014-08-15: qty 4000

## 2014-08-15 MED ORDER — SODIUM CHLORIDE 0.9 % IV SOLN: 100.0000 mL | INTRAVENOUS | Status: DC | PRN

## 2014-08-15 MED ORDER — HEPARIN SODIUM (PORCINE) 1000 UNIT/ML DIALYSIS
1000.0000 [IU] | INTRAMUSCULAR | Status: DC | PRN
  Filled 2014-08-15: qty 1

## 2014-08-15 MED ORDER — MIDAZOLAM HCL 5 MG/ML IJ SOLN
INTRAMUSCULAR | Status: AC
  Filled 2014-08-15: qty 3

## 2014-08-15 MED ORDER — PENTAFLUOROPROP-TETRAFLUOROETH EX AERO: 1.0000 "application " | INHALATION_SPRAY | CUTANEOUS | Status: DC | PRN

## 2014-08-15 MED ORDER — DARBEPOETIN ALFA 200 MCG/0.4ML IJ SOSY
200.0000 ug | PREFILLED_SYRINGE | INTRAMUSCULAR | Status: DC
  Administered 2014-08-16: 200 ug via INTRAVENOUS
  Filled 2014-08-15: qty 0.4

## 2014-08-15 MED ORDER — RENA-VITE PO TABS
1.0000 | ORAL_TABLET | Freq: Every day | ORAL | Status: DC
  Administered 2014-08-15 – 2014-08-17 (×3): 1 via ORAL
  Filled 2014-08-15 (×4): qty 1

## 2014-08-15 MED ORDER — MIDAZOLAM HCL 10 MG/2ML IJ SOLN
INTRAMUSCULAR | Status: DC | PRN
  Administered 2014-08-15: 2 mg via INTRAVENOUS

## 2014-08-15 MED ORDER — SODIUM CHLORIDE 0.9 % IV SOLN: INTRAVENOUS | Status: DC

## 2014-08-15 NOTE — Interval H&P Note (Signed)
History and Physical Interval Note:  08/15/2014 3:45 PM  Brad [name]  has presented today for surgery, with the diagnosis of Hematemesis  The various methods of treatment have been discussed with the patient and family. After consideration of risks, benefits and other options for treatment, the patient has consented to  Procedure(s): ESOPHAGOGASTRODUODENOSCOPY (EGD) (N/A) as a surgical intervention .  The patient's history has been reviewed, patient examined, no change in status, stable for surgery.  I have reviewed the patient's chart and labs.  Questions were answered to the patient's satisfaction.     [name] D

## 2014-08-15 NOTE — Consult Note (Signed)
Reason for Consult: Anemia, Melena, and Heme positive stool Referring Physician: [facility]  Ivonne Andrew HPI: This is a 79 year old male with a PMH of ESRD, HTN, chronic anemia, distal benign esophageal stricture, and GAVE who was admitted with symptomatic anemia.  He states that he was feeling weak and tired.  Further evaluation revealed that his HGB dropped down to 8.5 g/dL from 11.  He reports the onset of melena for the past week and he was recommended by Dialysis to return the stool cards.  The result was positive and then he was recommended to be admitted to the hospital.  Recently he was treated with a prednisone taper for his hip pain as well as some AVMs.  In his estimation, he started to have the melena when he took the medication.  Currently he is stable and no blood transfusions have been performed as he is a Restaurant manager, fast food.  Past Medical History  Diagnosis Date  . Hypertension   . High cholesterol   . Gout   . Anemia   . Renal disorder     End stage renal disease secondary to HTN nephrosclerosis  . Secondary hyperparathyroidism   . Hypocalcemia   . Colon polyps   . Paroxysmal supraventricular tachycardia   . Status post dilatation of esophageal stricture   . Syncope     Past Surgical History  Procedure Laterality Date  . Right arm amputation     . Revision of arteriovenous goretex graft Left 12/15/2012    Procedure: REVISION OF ARTERIOVENOUS GORETEX GRAFT using 29mm x 20cm Gortex graft;  Surgeon: Mal Misty, MD;  Location: [facility];  Service: Vascular;  Laterality: Left;  . Esophagogastroduodenoscopy N/A 01/27/2014    Procedure: ESOPHAGOGASTRODUODENOSCOPY (EGD);  Surgeon: Beryle Beams, MD;  Location: [facility];  Service: Endoscopy;  Laterality: N/A;  . Esophageal dilation N/A 01/27/2014    Procedure: ESOPHAGEAL DILATION;  Surgeon: Beryle Beams, MD;  Location: [facility];  Service: Endoscopy;  Laterality: N/A;  Balloon Dilation    History  reviewed. No pertinent family history.  Social History:  reports that he has never smoked. He does not have any smokeless tobacco history on file. He reports that he does not drink alcohol or use illicit drugs.  Allergies: No Known Allergies  Medications:  Scheduled: . amLODipine  10 mg Oral QHS  . atorvastatin  40 mg Oral q1800  . [START ON 08/16/2014] darbepoetin (ARANESP) injection - DIALYSIS  200 mcg Intravenous Q Wed-HD  . hydrALAZINE  50 mg Oral BID  . metoprolol  200 mg Oral QHS  . multivitamin  1 tablet Oral QHS  . pantoprazole (PROTONIX) IV  40 mg Intravenous Q12H  . sodium chloride  3 mL Intravenous Q12H   Continuous: . sodium chloride 500 mL (08/15/14 1458)    Results for orders placed or performed during the hospital encounter of 08/14/14 (from the past 24 hour(s))  CBC     Status: Abnormal   Collection Time: 08/14/14  4:43 PM  Result Value Ref Range   WBC 7.6 4.0 - 10.5 K/uL   RBC 2.81 (L) 4.22 - 5.81 MIL/uL   Hemoglobin 8.5 (L) 13.0 - 17.0 g/dL   HCT 25.6 (L) 39.0 - 52.0 %   MCV 91.1 78.0 - 100.0 fL   MCH 30.2 26.0 - 34.0 pg   MCHC 33.2 30.0 - 36.0 g/dL   RDW 20.8 (H) 11.5 - 15.5 %   Platelets 294 150 - 400 K/uL  Comprehensive metabolic panel     Status: Abnormal   Collection Time: 08/14/14  4:43 PM  Result Value Ref Range   Sodium 140 135 - 145 mmol/L   Potassium 3.8 3.5 - 5.1 mmol/L   Chloride 95 (L) 101 - 111 mmol/L   CO2 33 (H) 22 - 32 mmol/L   Glucose, Bld 87 70 - 99 mg/dL   BUN 22 (H) 6 - 20 mg/dL   Creatinine, Ser 4.74 (H) 0.61 - 1.24 mg/dL   Calcium 8.5 (L) 8.9 - 10.3 mg/dL   Total Protein 6.8 6.5 - 8.1 g/dL   Albumin 3.1 (L) 3.5 - 5.0 g/dL   AST 33 15 - 41 U/L   ALT 11 (L) 17 - 63 U/L   Alkaline Phosphatase 104 38 - 126 U/L   Total Bilirubin 0.6 0.3 - 1.2 mg/dL   GFR calc non Af Amer 11 (L) >60 mL/min   GFR calc Af Amer 13 (L) >60 mL/min   Anion gap 12 5 - 15  Type and screen for Red Blood Exchange     Status: None   Collection Time:  08/14/14  4:43 PM  Result Value Ref Range   ABO/RH(D) AB POS    Antibody Screen NEG    Sample Expiration 08/17/2014   ABO/Rh     Status: None   Collection Time: 08/14/14  4:43 PM  Result Value Ref Range   ABO/RH(D) AB POS   CBC WITH DIFFERENTIAL     Status: Abnormal   Collection Time: 08/14/14  9:36 PM  Result Value Ref Range   WBC 7.7 4.0 - 10.5 K/uL   RBC 2.58 (L) 4.22 - 5.81 MIL/uL   Hemoglobin 7.8 (L) 13.0 - 17.0 g/dL   HCT 23.6 (L) 39.0 - 52.0 %   MCV 91.5 78.0 - 100.0 fL   MCH 30.2 26.0 - 34.0 pg   MCHC 33.1 30.0 - 36.0 g/dL   RDW 21.0 (H) 11.5 - 15.5 %   Platelets 292 150 - 400 K/uL   Neutrophils Relative % 68 43 - 77 %   Neutro Abs 5.3 1.7 - 7.7 K/uL   Lymphocytes Relative 20 12 - 46 %   Lymphs Abs 1.5 0.7 - 4.0 K/uL   Monocytes Relative 11 3 - 12 %   Monocytes Absolute 0.8 0.1 - 1.0 K/uL   Eosinophils Relative 1 0 - 5 %   Eosinophils Absolute 0.1 0.0 - 0.7 K/uL   Basophils Relative 0 0 - 1 %   Basophils Absolute 0.0 0.0 - 0.1 K/uL  Protime-INR     Status: None   Collection Time: 08/14/14  9:36 PM  Result Value Ref Range   Prothrombin Time 13.9 11.6 - 15.2 seconds   INR 1.06 0.00 - 1.49  Reticulocytes     Status: Abnormal   Collection Time: 08/14/14  9:36 PM  Result Value Ref Range   Retic Ct Pct 7.1 (H) 0.4 - 3.1 %   RBC. 2.58 (L) 4.22 - 5.81 MIL/uL   Retic Count, Manual 183.2 19.0 - 186.0 K/uL  APTT     Status: None   Collection Time: 08/14/14  9:36 PM  Result Value Ref Range   aPTT 27 24 - 37 seconds  Iron and TIBC     Status: None   Collection Time: 08/14/14  9:36 PM  Result Value Ref Range   Iron 65 45 - 182 ug/dL   TIBC 280 250 - 450 ug/dL   Saturation Ratios 23 17.9 -  39.5 %   UIBC 215 ug/dL  Ferritin     Status: None   Collection Time: 08/14/14  9:36 PM  Result Value Ref Range   Ferritin 125 24 - 336 ng/mL  MRSA PCR Screening     Status: None   Collection Time: 08/14/14 11:05 PM  Result Value Ref Range   MRSA by PCR NEGATIVE NEGATIVE  CBC  with Differential/Platelet     Status: Abnormal   Collection Time: 08/15/14  7:20 AM  Result Value Ref Range   WBC 6.2 4.0 - 10.5 K/uL   RBC 2.77 (L) 4.22 - 5.81 MIL/uL   Hemoglobin 8.2 (L) 13.0 - 17.0 g/dL   HCT 25.2 (L) 39.0 - 52.0 %   MCV 91.0 78.0 - 100.0 fL   MCH 29.6 26.0 - 34.0 pg   MCHC 32.5 30.0 - 36.0 g/dL   RDW 20.7 (H) 11.5 - 15.5 %   Platelets 283 150 - 400 K/uL   Neutrophils Relative % 66 43 - 77 %   Neutro Abs 4.1 1.7 - 7.7 K/uL   Lymphocytes Relative 18 12 - 46 %   Lymphs Abs 1.1 0.7 - 4.0 K/uL   Monocytes Relative 14 (H) 3 - 12 %   Monocytes Absolute 0.8 0.1 - 1.0 K/uL   Eosinophils Relative 2 0 - 5 %   Eosinophils Absolute 0.1 0.0 - 0.7 K/uL   Basophils Relative 0 0 - 1 %   Basophils Absolute 0.0 0.0 - 0.1 K/uL  Renal function panel     Status: Abnormal   Collection Time: 08/15/14  7:20 AM  Result Value Ref Range   Sodium 140 135 - 145 mmol/L   Potassium 4.4 3.5 - 5.1 mmol/L   Chloride 95 (L) 101 - 111 mmol/L   CO2 30 22 - 32 mmol/L   Glucose, Bld 82 70 - 99 mg/dL   BUN 34 (H) 6 - 20 mg/dL   Creatinine, Ser 6.29 (H) 0.61 - 1.24 mg/dL   Calcium 8.7 (L) 8.9 - 10.3 mg/dL   Phosphorus 4.7 (H) 2.5 - 4.6 mg/dL   Albumin 2.9 (L) 3.5 - 5.0 g/dL   GFR calc non Af Amer 8 (L) >60 mL/min   GFR calc Af Amer 9 (L) >60 mL/min   Anion gap 15 5 - 15     No results found.  ROS:  As stated above in the HPI otherwise negative.  Blood pressure 160/73, pulse 69, temperature 98.6 F (37 C), temperature source Oral, resp. rate 25, height 5\' 11"  (1.803 m), weight 51.2 kg (112 lb 14 oz), SpO2 99 %.    PE: Gen: NAD, Alert and Oriented HEENT:  [place]/AT, EOMI Neck: Supple, no LAD Lungs: CTA Bilaterally CV: RRR without M/G/R ABM: Soft, NTND, +BS Ext: No C/C/E  Assessment/Plan: 1) Anemia. 2) Heme positive stool. 3) Melena.   He has a history of a benign esophageal peptic stricture that was diagnosed on 01/27/2014.  An EGD was performed for complaints of dysphagia.  His  symptoms resolved with dilation.  During the procedure he was also noted to have a mild GAVE that was not bleeding.  With the recent prednisone and NSAID use I will repeat the EGD.  He may have an upper GI bleed.    Plan: 1) EGD now.  Jameia Makris D 08/15/2014, 4:14 PM

## 2014-08-15 NOTE — Op Note (Signed)
[facility] [place], [id number]   ENDOSCOPY PROCEDURE REPORT  PATIENT: Brad [name], Brad [name]  MR#: [id number] BIRTHDATE: 05-03-1935 , 10  yrs. old GENDER: male ENDOSCOPIST:Clements Toro Benson Norway, MD REFERRED BY: PROCEDURE DATE:  2014/08/29 PROCEDURE:   EGD w/ ablation ASA CLASS:    Class III INDICATIONS: Anemia, Melena, and Heme positive stool MEDICATION: Fentanyl 25 mcg IV and Versed 2 mg IV TOPICAL ANESTHETIC:   none  DESCRIPTION OF PROCEDURE:   After the risks and benefits of the procedure were explained, informed consent was obtained.  The Pentax Gastroscope [id number]  endoscope was introduced through the mouth  and advanced to the second portion of the duodenum .  The instrument was slowly withdrawn as the mucosa was fully examined. Estimated blood loss is zero unless otherwise noted in this procedure report.   FINDINGS: A mild nonobstructive distal esophageal stricture was noted and it was not dilated.  The gastric fundus revealed a small amount of hematin and there was a possible AVM.  This area was treated with APC.  A mild nonbleeding GAVE was found.  In the second portion of the duodenum three nonbleeding small AVMs were found and ablated with APC.    Retroflexed views revealed no abnormalities.    The scope was then withdrawn from the patient and the procedure completed.  COMPLICATIONS: There were no immediate complications.  ENDOSCOPIC IMPRESSION: 1) Nonobstructive distal esophageal stricture. 2) Nonbleeding fundic and duodenal AVMs s/p APC. 3) Mild GAVE.  RECOMMENDATIONS: 1) Colonoscopy on Thursday as he had HD tomorrow.  I do not feel the current findings can explain his anemia and melena. _______________________________ eSigned:  Carol Ada, MD 29-Aug-2014 4:34 PM     cc:  CPT CODES: ICD CODES:  The ICD and CPT codes recommended by this software are interpretations from the data that the clinical staff has  captured with the software.  The verification of the translation of this report to the ICD and CPT codes and modifiers is the sole responsibility of the health care institution and practicing physician where this report was generated.  [facility] will not be held responsible for the validity of the ICD and CPT codes included on this report.  AMA assumes no liability for data contained or not contained herein. CPT is a Designer, television/film set of the [employer].  PATIENT NAME:  Brad [name], Brad [name] MR#: [id number]

## 2014-08-15 NOTE — Progress Notes (Signed)
Initial Nutrition Assessment  DOCUMENTATION CODES:  Severe malnutrition in context of chronic illness, Underweight   Pt meets criteria for SEVERE MALNUTRITION in the context of chronic illness as evidenced by severe fat and muscle mass loss.  INTERVENTION:   (Once diet advances, provide Nepro Shake BID, each supplement provides 425 kcal and 19 grams of protein.)  NUTRITION DIAGNOSIS:  Malnutrition related to chronic illness as evidenced by severe depletion of body fat, severe depletion of muscle mass.  GOAL:  Patient will meet greater than or equal to 90% of their needs  MONITOR:  Diet advancement, Weight trends, Labs, I & O's  REASON FOR ASSESSMENT:   (Low BMI)    ASSESSMENT: Pt with PMH of ESRD on HD, essential hypertension, history of gout, history of anemia secondary to renal disease, esophageal stricture, right arm amputation, malnutrition.The patient is presenting with complaints of generalized fatigue abnormal labs, GI bleed.  Pt is currently NPO for procedure today. Pt reports PTA he usually eats 3 meals a day with snacks along with Ensure twice daily. Usual body weight reported to be 120 lbs (weight loss not significant for time frame per Epic weight records). NFPE performed and revealed significant fat and muscle mass loss. Pt is agreeable on trying Nepro Shake once diet advances. RD to order.   Labs: Low potassium, calcium, and GFR. High BUN, creatinine, phosphorous (4.7).  Height:  Ht Readings from Last 1 Encounters:  08/14/14 5\' 11"  (1.803 m)    Weight:  Wt Readings from Last 1 Encounters:  08/14/14 112 lb 14 oz (51.2 kg)    Ideal Body Weight:  78 kg  Wt Readings from Last 10 Encounters:  08/14/14 112 lb 14 oz (51.2 kg)  01/27/14 119 lb (53.978 kg)  06/08/13 135 lb (61.236 kg)  01/11/13 129 lb (58.514 kg)  07/09/10 133 lb (60.328 kg)  03/12/10 137 lb 6.4 oz (62.324 kg)  02/05/10 139 lb 1.6 oz (63.095 kg)  11/01/09 139 lb (63.05 kg)  12/16/07  144 lb (65.318 kg)  12/02/07 145 lb 14.4 oz (66.18 kg)    BMI:  Body mass index is 15.75 kg/(m^2). Underweight  Adjusted body weight: 59.4 kg  Estimated Nutritional Needs:  Kcal:  1800-2000  Protein:  85-100 grams  Fluid:  Per MD  Skin:  Wound (see comment) (Wound on R buttocks)  Diet Order:  Diet NPO time specified Except for: Sips with Meds, Ice Chips  EDUCATION NEEDS:  No education needs identified at this time   Intake/Output Summary (Last 24 hours) at 08/15/14 1320 Last data filed at 08/15/14 1107  Gross per 24 hour  Intake      0 ml  Output      0 ml  Net      0 ml    Last BM:  5/9  Kallie Locks, MS, RD, LDN Pager # [phone] After hours/ weekend pager # [phone]

## 2014-08-15 NOTE — Progress Notes (Signed)
[facility] PROGRESS NOTE  Brad [name] BMS:[id number] DOB: 06/16/1935 DOA: 08/14/2014 PCP: No primary care provider on file.  Assessment/Plan: 1. Upper Gi Bleed -h/o peptic stricture in 10/15 s/p dilation -continue PPI Q12 -Consult Dr.Hung -Hb down to 8 from 10-11 range  -monitor, transfuse PRN  2. ESRD on HD MWF -renal notified  3. Anemia  -due to CKD and acute GI blood loss,  -monitor for now  4. Multiple foot ulcers -wound care, no s/s of cellulitis  5. Severe PCM -RD consult  6. Chronic hip and R shoulder pain -tylenol PRN  DVT proph: SCDs  Code Status: Full Code Family Communication: none at bedside (indicate person spoken with, relationship, and if by phone, the number) Disposition Plan: home pending workup   Consultants:  Gi Dr.Hung   HPI/Subjective: Feels well, denies current bleeding, h/o black stools per patient   Objective: Filed Vitals:   08/15/14 0859  BP: 138/60  Pulse: 69  Temp: 98 F (36.7 C)  Resp: 18    Intake/Output Summary (Last 24 hours) at 08/15/14 1113 Last data filed at 08/15/14 1107  Gross per 24 hour  Intake      0 ml  Output      0 ml  Net      0 ml   Filed Weights   08/14/14 1446 08/14/14 2015  Weight: 53.978 kg (119 lb) 51.2 kg (112 lb 14 oz)    Exam:   General: AAOx3, emaciated and chronically ill appearing  Cardiovascular: S1s2/RRR  Respiratory: CTAB  Abdomen: soft, NT, BS present  Musculoskeletal: s/p R arm amputation, no edema c/c, small heel ulcers with dressing     Data Reviewed: Basic Metabolic Panel:  Recent Labs Lab 08/14/14 1643 08/15/14 0720  NA 140 140  K 3.8 4.4  CL 95* 95*  CO2 33* 30  GLUCOSE 87 82  BUN 22* 34*  CREATININE 4.74* 6.29*  CALCIUM 8.5* 8.7*  PHOS  --  4.7*   Liver Function Tests:  Recent Labs Lab 08/14/14 1643 08/15/14 0720  AST 33  --   ALT 11*  --   ALKPHOS 104  --   BILITOT 0.6  --   PROT 6.8  --   ALBUMIN 3.1* 2.9*   No results for  input(s): LIPASE, AMYLASE in the last 168 hours. No results for input(s): AMMONIA in the last 168 hours. CBC:  Recent Labs Lab 08/14/14 1643 08/14/14 2136 08/15/14 0720  WBC 7.6 7.7 6.2  NEUTROABS  --  5.3 4.1  HGB 8.5* 7.8* 8.2*  HCT 25.6* 23.6* 25.2*  MCV 91.1 91.5 91.0  PLT 294 292 283   Cardiac Enzymes: No results for input(s): CKTOTAL, CKMB, CKMBINDEX, TROPONINI in the last 168 hours. BNP (last 3 results) No results for input(s): BNP in the last 8760 hours.  ProBNP (last 3 results) No results for input(s): PROBNP in the last 8760 hours.  CBG: No results for input(s): GLUCAP in the last 168 hours.  Recent Results (from the past 240 hour(s))  MRSA PCR Screening     Status: None   Collection Time: 08/14/14 11:05 PM  Result Value Ref Range Status   MRSA by PCR NEGATIVE NEGATIVE Final    Comment:        The GeneXpert MRSA Assay (FDA approved for NASAL specimens only), is one component of a comprehensive MRSA colonization surveillance program. It is not intended to diagnose MRSA infection nor to guide or monitor treatment for MRSA infections.  Studies: No results found.  Scheduled Meds: . amLODipine  10 mg Oral QHS  . atorvastatin  40 mg Oral q1800  . [START ON 08/16/2014] darbepoetin (ARANESP) injection - DIALYSIS  200 mcg Intravenous Q Wed-HD  . hydrALAZINE  50 mg Oral BID  . metoprolol  200 mg Oral QHS  . multivitamin  1 tablet Oral QHS  . pantoprazole (PROTONIX) IV  40 mg Intravenous Q12H  . sodium chloride  3 mL Intravenous Q12H   Continuous Infusions:  Antibiotics Given (last 72 hours)    None      Principal Problem:   GI bleed Active Problems:   HYPERTENSION, BENIGN SYSTEMIC   CHRONIC KIDNEY DISEASE STAGE V   ESRD on dialysis   Anemia of chronic renal failure, stage 5    Time spent: 86min    [facility] Pager [phone]. If 7PM-7AM, please contact night-coverage at [email or web address], password [facility] 08/15/2014,  11:13 AM  LOS: 1 day

## 2014-08-15 NOTE — Consult Note (Signed)
Indication for Consultation:  Management of ESRD/hemodialysis; anemia, hypertension/volume and secondary hyperparathyroidism  HPI: Brad [name] is a 79 y.o. male who presented to the ED yesterday with hgb 6.8, heme +stools and complaints of weakness- his HD unit recommended he come for evaluation. He receives HD MWF @ [place], had full HD yesterday, history of HTN, R arm amputation. He reports he has been feeling well but did notice he was weak and tired for the past few weeks. He denies bright red blood in stool but says that stool did look darker this week. He was recently on a prednisone taper for hip pain and taking tylenol as well, he denies NSAID use.  He was recently treated for Ancef for MSSA of wound near HD access. He was started on doxycycline 5/6 for LE ulcers. He is feeling well this AM, no complains. Will arrange HD while admitted.   Past Medical History  Diagnosis Date  . Hypertension   . High cholesterol   . Gout   . Anemia   . Renal disorder     End stage renal disease secondary to HTN nephrosclerosis  . Secondary hyperparathyroidism   . Hypocalcemia   . Colon polyps   . Paroxysmal supraventricular tachycardia   . Status post dilatation of esophageal stricture   . Syncope    Past Surgical History  Procedure Laterality Date  . Right arm amputation     . Revision of arteriovenous goretex graft Left 12/15/2012    Procedure: REVISION OF ARTERIOVENOUS GORETEX GRAFT using 74mm x 20cm Gortex graft;  Surgeon: Mal Misty, MD;  Location: [facility];  Service: Vascular;  Laterality: Left;  . Esophagogastroduodenoscopy N/A 01/27/2014    Procedure: ESOPHAGOGASTRODUODENOSCOPY (EGD);  Surgeon: Beryle Beams, MD;  Location: [facility];  Service: Endoscopy;  Laterality: N/A;  . Esophageal dilation N/A 01/27/2014    Procedure: ESOPHAGEAL DILATION;  Surgeon: Beryle Beams, MD;  Location: [facility];  Service: Endoscopy;  Laterality: N/A;  Balloon Dilation   History reviewed. No  pertinent family history. Social History:  reports that he has never smoked. He does not have any smokeless tobacco history on file. He reports that he does not drink alcohol or use illicit drugs. No Known Allergies Prior to Admission medications   Medication Sig Start Date End Date Taking? Authorizing Provider  acetaminophen (TYLENOL) 500 MG tablet Take 1,000 mg by mouth every 6 (six) hours as needed for mild pain or moderate pain.   Yes Historical Provider, MD  amLODipine (NORVASC) 10 MG tablet Take 10 mg by mouth at bedtime. For blood pressure    Yes Historical Provider, MD  atorvastatin (LIPITOR) 40 MG tablet Take 40 mg by mouth daily.    Yes Historical Provider, MD  B Complex-C-Folic Acid (DIALYVITE TABLET) TABS Take 1 tablet by mouth daily.     Yes Historical Provider, MD  calcium acetate (PHOSLO) 667 MG capsule Take 1,334 mg by mouth 3 (three) times daily with meals. And 1 cap with snack   Yes Historical Provider, MD  colchicine 0.6 MG tablet Take 0.6 mg by mouth 3 (three) times a week. Monday, wednesday, friday   Yes Historical Provider, MD  doxycycline (VIBRAMYCIN) 100 MG capsule Take 100 mg by mouth 2 (two) times daily. 08/11/14  Yes Historical Provider, MD  hydrALAZINE (APRESOLINE) 50 MG tablet Take 50 mg by mouth 2 (two) times daily.   Yes Historical Provider, MD  metoprolol (TOPROL-XL) 200 MG 24 hr tablet Take 200 mg by mouth at  bedtime.    Yes Historical Provider, MD  omeprazole (PRILOSEC) 20 MG capsule Take 20 mg by mouth daily.     Yes Historical Provider, MD  oxyCODONE (ROXICODONE) 5 MG immediate release tablet Take 1 tablet (5 mg total) by mouth every 6 (six) hours as needed for severe pain. 06/04/13  Yes Domenic Moras, PA-C  traMADol (ULTRAM) 50 MG tablet Take 1 tablet (50 mg total) by mouth every 12 (twelve) hours as needed for moderate pain or severe pain. 07/22/14  Yes Melony Overly, MD  triamcinolone cream (KENALOG) 0.1 % Apply 1 application topically 2 (two) times daily.  05/17/13   Yes Historical Provider, MD  oseltamivir (TAMIFLU) 30 MG capsule Take 1 capsule (30 mg total) by mouth See admin instructions. Patient not taking: Reported on 08/14/2014 06/08/13   Ruthell Rummage, MD   Current Facility-Administered Medications  Medication Dose Route Frequency Provider Last Rate Last Dose  . acetaminophen (TYLENOL) tablet 650 mg  650 mg Oral Q6H PRN Lavina Hamman, MD       Or  . acetaminophen (TYLENOL) suppository 650 mg  650 mg Rectal Q6H PRN Lavina Hamman, MD      . amLODipine (NORVASC) tablet 10 mg  10 mg Oral QHS Lavina Hamman, MD   10 mg at 08/14/14 2238  . atorvastatin (LIPITOR) tablet 40 mg  40 mg Oral q1800 Lavina Hamman, MD      . calcium acetate (PHOSLO) capsule 1,334 mg  1,334 mg Oral TID WC Lavina Hamman, MD      . hydrALAZINE (APRESOLINE) tablet 50 mg  50 mg Oral BID Lavina Hamman, MD   50 mg at 08/14/14 2238  . metoprolol succinate (TOPROL-XL) 24 hr tablet 200 mg  200 mg Oral QHS Lavina Hamman, MD   200 mg at 08/14/14 2238  . ondansetron (ZOFRAN) tablet 4 mg  4 mg Oral Q6H PRN Lavina Hamman, MD       Or  . ondansetron [facility]) injection 4 mg  4 mg Intravenous Q6H PRN Lavina Hamman, MD      . oxyCODONE (Oxy IR/ROXICODONE) immediate release tablet 5 mg  5 mg Oral Q6H PRN Lavina Hamman, MD   5 mg at 08/15/14 0500  . pantoprazole (PROTONIX) injection 40 mg  40 mg Intravenous Q12H Lavina Hamman, MD   40 mg at 08/14/14 2238  . sodium chloride 0.9 % injection 3 mL  3 mL Intravenous Q12H Lavina Hamman, MD   3 mL at 08/14/14 2239  . traMADol-acetaminophen (ULTRACET) 37.5-325 MG per tablet 1 tablet  1 tablet Oral Q8H PRN Lavina Hamman, MD       Labs: Basic Metabolic Panel:  Recent Labs Lab 08/14/14 1643 08/15/14 0720  NA 140 140  K 3.8 4.4  CL 95* 95*  CO2 33* 30  GLUCOSE 87 82  BUN 22* 34*  CREATININE 4.74* 6.29*  CALCIUM 8.5* 8.7*  PHOS  --  4.7*   Liver Function Tests:  Recent Labs Lab 08/14/14 1643 08/15/14 0720  AST 33  --   ALT 11*  --    ALKPHOS 104  --   BILITOT 0.6  --   PROT 6.8  --   ALBUMIN 3.1* 2.9*   No results for input(s): LIPASE, AMYLASE in the last 168 hours. No results for input(s): AMMONIA in the last 168 hours. CBC:  Recent Labs Lab 08/14/14 1643 08/14/14 2136 08/15/14 0720  WBC 7.6 7.7 6.2  NEUTROABS  --  5.3 4.1  HGB 8.5* 7.8* 8.2*  HCT 25.6* 23.6* 25.2*  MCV 91.1 91.5 91.0  PLT 294 292 283   Cardiac Enzymes: No results for input(s): CKTOTAL, CKMB, CKMBINDEX, TROPONINI in the last 168 hours. CBG: No results for input(s): GLUCAP in the last 168 hours. Iron Studies:  Recent Labs  08/14/14 2136  IRON 65  TIBC 280  FERRITIN 125   Studies/Results: No results found.   Review of Systems: Gen: Positive for fatigue, weakness. Denies any fever, chills, sweats, anorexia, malaise, weight loss, and sleep disorder HEENT: No visual complaints, No history of Retinopathy. Normal external appearance No Epistaxis or Sore throat. No sinusitis.   CV: Denies chest pain, angina, palpitations, syncope, orthopnea, PND, peripheral edema, and claudication. Resp: Denies dyspnea at rest, dyspnea with exercise, cough, sputum, wheezing, coughing up blood, and pleurisy. GI: Denies vomiting blood, jaundice, and fecal incontinence.   Denies dysphagia or odynophagia. Reports dark stools GU : anuric  MS: Report hip pain, Recent pred taper. No use of non steroidal antiinflammatory drugs. Derm: multiple small ulcers  Psych: Denies depression, anxiety, memory loss, suicidal ideation, hallucinations, paranoia, and confusion. Heme: Denies bruising, bleeding, and enlarged lymph nodes. Neuro: No headache.  No diplopia. No dysarthria.  No dysphasia.  No history of CVA.  No Seizures. No paresthesias.  No weakness. Endocrine No DM.  No Thyroid disease.  No Adrenal disease.  Physical Exam: Filed Vitals:   08/14/14 1930 08/14/14 2015 08/15/14 0501 08/15/14 0859  BP: 154/70 156/66 151/72 138/60  Pulse: 72 72 81 69  Temp:  98  F (36.7 C) 98 F (36.7 C) 98 F (36.7 C)  TempSrc:  Oral Oral Oral  Resp: 16 16 18 18   Height:  5\' 11"  (1.803 m)    Weight:  51.2 kg (112 lb 14 oz)    SpO2: 100% 100% 98% 100%     General: Thin, in no acute distress. Head: Normocephalic, atraumatic, sclera non-icteric, mucus membranes are moist Neck: Supple. JVD not elevated. Lungs: Clear bilaterally to auscultation without wheezes, rales, or rhonchi. Breathing is unlabored. Heart: RRR with S1 S2. No murmurs, rubs, or gallops appreciated. Abdomen: Soft, non-tender, non-distended with normoactive bowel sounds. No rebound/guarding. No obvious abdominal masses. M-S:  Strength and tone appear normal for age. R arm amputation.  Lower extremities: multiple small ulcers, do not appear infected. No edema Neuro: Alert and oriented X 3. Moves all extremities spontaneously. Psych:  Responds to questions appropriately with a normal affect. Dialysis Access: L AVG +b.t  Dialysis Orders:  MWF @ [facility] 3hr 45 mins   53kgs   2K/2Ca+  5400u heparin L AVG Micera 200 (to start 5/11)  Calcitriol 0.5 tsat 4/13 45- no FE Phos 2.3 5/4. Last pth 431   Assessment/Plan: 1.  GI bleed- heme + stools, outpt hgb 6.8- now 8.2. Is Raymond Gurney witness but agrees to blood transfusion if needed. Protonix. Give ESA tomorrow- is due for micera 5.11- per primary.  2.  ESRD -  MWF [facility], HD tomorrow, no heparin 3.  Hypertension/volume  - 138/60- amlodipine and metp. No volume excess.  4.  Metabolic bone disease -  Ca+ 8.7 cont calcitriol. outpt phos has been less than 4 since Feb, most recent  2.3- hold binders 5.  Nutrition - NPO, renal diet when advanced and supplements 6. Lower ext ulcers- recently on doxycylcine 7. Hip pain- recently completed prednisone taper- oxycodone and tramadol  Shelle Iron, NP [facility] [phone] 08/15/2014,  9:19 AM   Pt seen, examined and agree w A/P as above.  Kelly Splinter MD pager [phone]    cell  [phone] 08/15/2014, 3:53 PM

## 2014-08-15 NOTE — H&P (View-Only) (Signed)
[facility] PROGRESS NOTE  SAINTCLAIR SCHROADER WSF:[id number] DOB: 04/23/1935 DOA: 08/14/2014 PCP: No primary care provider on file.  Assessment/Plan: 1. Upper Gi Bleed -h/o peptic stricture in 10/15 s/p dilation -continue PPI Q12 -Consult Dr.Hung -Hb down to 8 from 10-11 range  -monitor, transfuse PRN  2. ESRD on HD MWF -renal notified  3. Anemia  -due to CKD and acute GI blood loss,  -monitor for now  4. Multiple foot ulcers -wound care, no s/s of cellulitis  5. Severe PCM -RD consult  6. Chronic hip and R shoulder pain -tylenol PRN  DVT proph: SCDs  Code Status: Full Code Family Communication: none at bedside (indicate person spoken with, relationship, and if by phone, the number) Disposition Plan: home pending workup   Consultants:  Gi Dr.Hung   HPI/Subjective: Feels well, denies current bleeding, h/o black stools per patient   Objective: Filed Vitals:   08/15/14 0859  BP: 138/60  Pulse: 69  Temp: 98 F (36.7 C)  Resp: 18    Intake/Output Summary (Last 24 hours) at 08/15/14 1113 Last data filed at 08/15/14 1107  Gross per 24 hour  Intake      0 ml  Output      0 ml  Net      0 ml   Filed Weights   08/14/14 1446 08/14/14 2015  Weight: 53.978 kg (119 lb) 51.2 kg (112 lb 14 oz)    Exam:   General: AAOx3, emaciated and chronically ill appearing  Cardiovascular: S1s2/RRR  Respiratory: CTAB  Abdomen: soft, NT, BS present  Musculoskeletal: s/p R arm amputation, no edema c/c, small heel ulcers with dressing     Data Reviewed: Basic Metabolic Panel:  Recent Labs Lab 08/14/14 1643 08/15/14 0720  NA 140 140  K 3.8 4.4  CL 95* 95*  CO2 33* 30  GLUCOSE 87 82  BUN 22* 34*  CREATININE 4.74* 6.29*  CALCIUM 8.5* 8.7*  PHOS  --  4.7*   Liver Function Tests:  Recent Labs Lab 08/14/14 1643 08/15/14 0720  AST 33  --   ALT 11*  --   ALKPHOS 104  --   BILITOT 0.6  --   PROT 6.8  --   ALBUMIN 3.1* 2.9*   No results for  input(s): LIPASE, AMYLASE in the last 168 hours. No results for input(s): AMMONIA in the last 168 hours. CBC:  Recent Labs Lab 08/14/14 1643 08/14/14 2136 08/15/14 0720  WBC 7.6 7.7 6.2  NEUTROABS  --  5.3 4.1  HGB 8.5* 7.8* 8.2*  HCT 25.6* 23.6* 25.2*  MCV 91.1 91.5 91.0  PLT 294 292 283   Cardiac Enzymes: No results for input(s): CKTOTAL, CKMB, CKMBINDEX, TROPONINI in the last 168 hours. BNP (last 3 results) No results for input(s): BNP in the last 8760 hours.  ProBNP (last 3 results) No results for input(s): PROBNP in the last 8760 hours.  CBG: No results for input(s): GLUCAP in the last 168 hours.  Recent Results (from the past 240 hour(s))  MRSA PCR Screening     Status: None   Collection Time: 08/14/14 11:05 PM  Result Value Ref Range Status   MRSA by PCR NEGATIVE NEGATIVE Final    Comment:        The GeneXpert MRSA Assay (FDA approved for NASAL specimens only), is one component of a comprehensive MRSA colonization surveillance program. It is not intended to diagnose MRSA infection nor to guide or monitor treatment for MRSA infections.  Studies: No results found.  Scheduled Meds: . amLODipine  10 mg Oral QHS  . atorvastatin  40 mg Oral q1800  . [START ON 08/16/2014] darbepoetin (ARANESP) injection - DIALYSIS  200 mcg Intravenous Q Wed-HD  . hydrALAZINE  50 mg Oral BID  . metoprolol  200 mg Oral QHS  . multivitamin  1 tablet Oral QHS  . pantoprazole (PROTONIX) IV  40 mg Intravenous Q12H  . sodium chloride  3 mL Intravenous Q12H   Continuous Infusions:  Antibiotics Given (last 72 hours)    None      Principal Problem:   GI bleed Active Problems:   HYPERTENSION, BENIGN SYSTEMIC   CHRONIC KIDNEY DISEASE STAGE V   ESRD on dialysis   Anemia of chronic renal failure, stage 5    Time spent: 40min    [facility] Pager [phone]. If 7PM-7AM, please contact night-coverage at [email or web address], password [facility] 08/15/2014,  11:13 AM  LOS: 1 day

## 2014-08-16 ENCOUNTER — Encounter (HOSPITAL_COMMUNITY): Payer: Self-pay | Admitting: Gastroenterology

## 2014-08-16 DIAGNOSIS — E43 Unspecified severe protein-calorie malnutrition: Secondary | ICD-10-CM

## 2014-08-16 DIAGNOSIS — D631 Anemia in chronic kidney disease: Secondary | ICD-10-CM | POA: Diagnosis not present

## 2014-08-16 DIAGNOSIS — K921 Melena: Secondary | ICD-10-CM | POA: Diagnosis not present

## 2014-08-16 DIAGNOSIS — I10 Essential (primary) hypertension: Secondary | ICD-10-CM | POA: Diagnosis not present

## 2014-08-16 DIAGNOSIS — N185 Chronic kidney disease, stage 5: Secondary | ICD-10-CM | POA: Diagnosis not present

## 2014-08-16 DIAGNOSIS — Z992 Dependence on renal dialysis: Secondary | ICD-10-CM | POA: Diagnosis not present

## 2014-08-16 DIAGNOSIS — N186 End stage renal disease: Secondary | ICD-10-CM | POA: Diagnosis not present

## 2014-08-16 LAB — BASIC METABOLIC PANEL
Anion gap: 15 (ref 5–15)
BUN: 53 mg/dL — ABNORMAL HIGH (ref 6–20)
CALCIUM: 8.5 mg/dL — AB (ref 8.9–10.3)
CO2: 27 mmol/L (ref 22–32)
CREATININE: 8.41 mg/dL — AB (ref 0.61–1.24)
Chloride: 94 mmol/L — ABNORMAL LOW (ref 101–111)
GFR calc Af Amer: 6 mL/min — ABNORMAL LOW (ref >60)
GFR calc non Af Amer: 5 mL/min — ABNORMAL LOW (ref >60)
GLUCOSE: 78 mg/dL (ref 70–99)
Potassium: 5.7 mmol/L — ABNORMAL HIGH (ref 3.5–5.1)
Sodium: 136 mmol/L (ref 135–145)

## 2014-08-16 LAB — PREPARE RBC (CROSSMATCH)

## 2014-08-16 LAB — CBC
HCT: 23.4 % — ABNORMAL LOW (ref 39.0–52.0)
Hemoglobin: 7.9 g/dL — ABNORMAL LOW (ref 13.0–17.0)
MCH: 30.4 pg (ref 26.0–34.0)
MCHC: 33.8 g/dL (ref 30.0–36.0)
MCV: 90 fL (ref 78.0–100.0)
PLATELETS: 263 10*3/uL (ref 150–400)
RBC: 2.6 MIL/uL — ABNORMAL LOW (ref 4.22–5.81)
RDW: 20.3 % — AB (ref 11.5–15.5)
WBC: 7.2 10*3/uL (ref 4.0–10.5)

## 2014-08-16 MED ORDER — NA FERRIC GLUC CPLX IN SUCROSE 12.5 MG/ML IV SOLN
62.5000 mg | INTRAVENOUS | Status: DC
  Administered 2014-08-16: 62.5 mg via INTRAVENOUS
  Filled 2014-08-16 (×2): qty 5

## 2014-08-16 MED ORDER — ACETAMINOPHEN 325 MG PO TABS
ORAL_TABLET | ORAL | Status: AC
  Filled 2014-08-16: qty 2

## 2014-08-16 MED ORDER — DIPHENHYDRAMINE HCL 25 MG PO CAPS
25.0000 mg | ORAL_CAPSULE | Freq: Once | ORAL | Status: AC
  Administered 2014-08-16: 25 mg via ORAL
  Filled 2014-08-16: qty 1

## 2014-08-16 MED ORDER — DIPHENHYDRAMINE HCL 25 MG PO CAPS
ORAL_CAPSULE | ORAL | Status: AC
  Filled 2014-08-16: qty 1

## 2014-08-16 MED ORDER — SODIUM CHLORIDE 0.9 % IV SOLN
Freq: Once | INTRAVENOUS | Status: AC
  Administered 2014-08-17: 500 mL via INTRAVENOUS

## 2014-08-16 MED ORDER — ACETAMINOPHEN 325 MG PO TABS
650.0000 mg | ORAL_TABLET | Freq: Once | ORAL | Status: AC
  Administered 2014-08-16: 650 mg via ORAL

## 2014-08-16 MED ORDER — DARBEPOETIN ALFA 200 MCG/0.4ML IJ SOSY
PREFILLED_SYRINGE | INTRAMUSCULAR | Status: AC
  Filled 2014-08-16: qty 0.4

## 2014-08-16 NOTE — Progress Notes (Signed)
[facility] PROGRESS NOTE  Brad [name]:[id number] DOB: 23-Mar-1936 DOA: 08/14/2014 PCP: No primary care provider on file.  79 y/o ? h/o ESRD HD M/w/f @ [facility], gout, HTN, right arm amputation, known history peptic stricture 10/15 status post dilatation + hiatal hernia + GAVE, prior right frontal infarct found in 2007, recent Rx MSSA wound near HD access-also Rx doxycycline 5/6 lower extremity ulcers admitted on 08/14/14 with colchicine-induced diarrhea and probable steroid induced gastritis in the setting of taking chronic NSAIDs for hip pain Ultimately underwent upper endoscopy this admission 5/7 which was non-revealing   Assessment/Plan: 1. GI bleed -h/o peptic stricture in 10/15 s/p dilation, repeat endoscopy 5/10 = nonobstructive distal dysphagia structure nonbleeding fundic and duodenal AVM status post APC  -continue PPI Q12 -GI is planning to do colonoscopy 08/17/14 -Hb down to 7.6 from 10-11 ran -Patient transfused 1 unit PRBC 08/16/14   2. ESRD on HD MWF with hyperkalemia -renal notified -Appreciate input   3. Anemia  of acute blood loss superimposed on anemia of renal/chronic disease -Transfused 1 unit PRBC 5/11   4. Multiple foot ulcers -wound care, no s/s of cellulitis  5. Severe P-energy malnutirtion -RD consult  6. Chronic hip and R shoulder pain -tylenol PRN -Avoid NSAIDs going forward  DVT proph: SCDs  Code Status: Full Code Family Communication: none at bedside (indicate person spoken with, relationship, and if by phone, the number) Disposition Plan: home pending workup   Consultants:  Gi Dr.Hung   HPI/Subjective:  Fair No issues BP dropped a litt being transfused here on dialysis unit No other issues Ate sandwich and jello today  Objective: Filed Vitals:   08/16/14 1550  BP: 121/55  Pulse: 69  Temp: 98.4 F (36.9 C)  Resp:     Intake/Output Summary (Last 24 hours) at 08/16/14 1554 Last data filed at 08/16/14 0912  Gross per 24  hour  Intake   1420 ml  Output      0 ml  Net   1420 ml   Filed Weights   08/14/14 2015 08/15/14 2123 08/16/14 1300  Weight: 51.2 kg (112 lb 14 oz) 53.8 kg (118 lb 9.7 oz) 53.4 kg (117 lb 11.6 oz)    Exam:   General: AAOx3, emaciated and chronically ill appearing  Cardiovascular: S1s2/RRR  Respiratory: CTAB  Abdomen: soft, NT, BS present  Musculoskeletal: s/p R arm amputation, no edema c/c, small heel ulcers with dressing     Data Reviewed: Basic Metabolic Panel:  Recent Labs Lab 08/14/14 1643 08/15/14 0720 08/15/14 1854 08/16/14 0443  NA 140 140 138 136  K 3.8 4.4 5.2* 5.7*  CL 95* 95* 93* 94*  CO2 33* 30 27 27   GLUCOSE 87 82 133* 78  BUN 22* 34* 46* 53*  CREATININE 4.74* 6.29* 7.54* 8.41*  CALCIUM 8.5* 8.7* 8.9 8.5*  PHOS  --  4.7* 5.8*  --    Liver Function Tests:  Recent Labs Lab 08/14/14 1643 08/15/14 0720 08/15/14 1854  AST 33  --   --   ALT 11*  --   --   ALKPHOS 104  --   --   BILITOT 0.6  --   --   PROT 6.8  --   --   ALBUMIN 3.1* 2.9* 3.2*   No results for input(s): LIPASE, AMYLASE in the last 168 hours. No results for input(s): AMMONIA in the last 168 hours. CBC:  Recent Labs Lab 08/14/14 1643 08/14/14 2136 08/15/14 0720 08/15/14 1854 08/16/14 0254  WBC 7.6 7.7 6.2 7.5 7.2  NEUTROABS  --  5.3 4.1  --   --   HGB 8.5* 7.8* 8.2* 8.6* 7.9*  HCT 25.6* 23.6* 25.2* 26.6* 23.4*  MCV 91.1 91.5 91.0 92.0 90.0  PLT 294 292 283 285 263   Cardiac Enzymes: No results for input(s): CKTOTAL, CKMB, CKMBINDEX, TROPONINI in the last 168 hours. BNP (last 3 results) No results for input(s): BNP in the last 8760 hours.  ProBNP (last 3 results) No results for input(s): PROBNP in the last 8760 hours.  CBG: No results for input(s): GLUCAP in the last 168 hours.  Recent Results (from the past 240 hour(s))  MRSA PCR Screening     Status: None   Collection Time: 08/14/14 11:05 PM  Result Value Ref Range Status   MRSA by PCR NEGATIVE NEGATIVE  Final    Comment:        The GeneXpert MRSA Assay (FDA approved for NASAL specimens only), is one component of a comprehensive MRSA colonization surveillance program. It is not intended to diagnose MRSA infection nor to guide or monitor treatment for MRSA infections.      Studies: No results found.  Scheduled Meds: . sodium chloride   Intravenous Once  . acetaminophen      . amLODipine  10 mg Oral QHS  . atorvastatin  40 mg Oral q1800  . Darbepoetin Alfa      . darbepoetin (ARANESP) injection - DIALYSIS  200 mcg Intravenous Q Wed-HD  . diphenhydrAMINE      . ferric gluconate (FERRLECIT/NULECIT) IV  62.5 mg Intravenous Weekly  . hydrALAZINE  50 mg Oral BID  . metoprolol  200 mg Oral QHS  . multivitamin  1 tablet Oral QHS  . pantoprazole (PROTONIX) IV  40 mg Intravenous Q12H  . polyethylene glycol-electrolytes  4,000 mL Oral Once  . sodium chloride  3 mL Intravenous Q12H   Continuous Infusions: . sodium chloride     Antibiotics Given (last 72 hours)    None      Principal Problem:   GI bleed Active Problems:   HYPERTENSION, BENIGN SYSTEMIC   CHRONIC KIDNEY DISEASE STAGE V   ESRD on dialysis   Anemia of chronic renal failure, stage 5   Protein-calorie malnutrition, severe    Time spent: 57min   Verneita Griffes, MD [facility] [phone]

## 2014-08-16 NOTE — Procedures (Signed)
Pt seen on HD.  Ap 270 Vp 200.  BFR 400.  Tolerating HD well so far.

## 2014-08-16 NOTE — Progress Notes (Signed)
Subjective:   Feeling well. Not as tired as he had been. Tolerating clears.  Objective Filed Vitals:   08/15/14 1610 08/15/14 1700 08/15/14 2123 08/16/14 0452  BP: 137/70 157/60 125/53 144/42  Pulse: 71 69 65 66  Temp:  98 F (36.7 C) 97.9 F (36.6 C) 98.2 F (36.8 C)  TempSrc:  Oral Oral Oral  Resp: 19 18 17 15   Height:      Weight:   53.8 kg (118 lb 9.7 oz)   SpO2: 99% 100% 99% 100%   Physical Exam General: alert and oriented. No acute distress.  Heart: RRR  Lungs: CTA, unlabored Abdomen: soft, mild tenderness RLQ. + BS Extremities: no edema.  R Arm amputation Dialysis Access: L AVG +b/t  Dialysis Orders: MWF @ [facility] 3hr 45 mins 53kgs 2K/2Ca+ 5400u heparin L AVG Micera 200 (to start 5/11) Calcitriol 0.5 tsat 4/13 45- no FE Phos 2.3 5/4. Last pth 431  Assessment/Plan: 1. GI bleed- heme + stools, outpt hgb 6.8- now 7.9. Is Raymond Gurney witness but agrees to blood transfusion if needed. Protonix. Give ESA today- is due for micera 5/11. tsat 23-start weekly Fe. GI following. EGD 5/10- nonbleeding AVMs. Mild GAVE. For colonscopy Thursday.  2. ESRD - MWF [facility], HD today, no heparin 3. Hypertension/volume - 144/42- amlodipine and metp. No volume excess.  4. Metabolic bone disease - Ca+ 8.7 cont calcitriol. outpt phos has been less than 4 since Feb, now 5.8- restart binders when diet advanced. 5. Nutrition - alb 3.2 clears. and supplements/vitamin 6. Lower ext ulcers- recently on doxycylcine 7. Hip pain- recently completed prednisone taper- oxycodone and tramadol  Shelle Iron, NP [place] [phone] 08/16/2014,9:05 AM  LOS: 2 days  I have seen and examined this patient and agree with plan per Shelle Iron.  Hg sl lower.  Denies any BRBPR.  Says he will take blood if needed.  Plan HD today . Jayd Forrey T,MD 08/16/2014 10:17 AM Additional Objective Labs: Basic Metabolic Panel:  Recent Labs Lab 08/15/14 0720 08/15/14 1854  08/16/14 0443  NA 140 138 136  K 4.4 5.2* 5.7*  CL 95* 93* 94*  CO2 30 27 27   GLUCOSE 82 133* 78  BUN 34* 46* 53*  CREATININE 6.29* 7.54* 8.41*  CALCIUM 8.7* 8.9 8.5*  PHOS 4.7* 5.8*  --    Liver Function Tests:  Recent Labs Lab 08/14/14 1643 08/15/14 0720 08/15/14 1854  AST 33  --   --   ALT 11*  --   --   ALKPHOS 104  --   --   BILITOT 0.6  --   --   PROT 6.8  --   --   ALBUMIN 3.1* 2.9* 3.2*   No results for input(s): LIPASE, AMYLASE in the last 168 hours. CBC:  Recent Labs Lab 08/14/14 1643 08/14/14 2136 08/15/14 0720 08/15/14 1854 08/16/14 0443  WBC 7.6 7.7 6.2 7.5 7.2  NEUTROABS  --  5.3 4.1  --   --   HGB 8.5* 7.8* 8.2* 8.6* 7.9*  HCT 25.6* 23.6* 25.2* 26.6* 23.4*  MCV 91.1 91.5 91.0 92.0 90.0  PLT 294 292 283 285 263   Blood Culture    Component Value Date/Time   SDES BLOOD RIGHT LOWER LEG 06/08/2013 1615   SPECREQUEST BOTTLES DRAWN AEROBIC AND ANAEROBIC B 10CC R [place] 06/08/2013 1615   CULT  06/08/2013 1615    NO GROWTH 5 DAYS Performed at [place] 06/14/2013 FINAL 06/08/2013 1615  Cardiac Enzymes: No results for input(s): CKTOTAL, CKMB, CKMBINDEX, TROPONINI in the last 168 hours. CBG: No results for input(s): GLUCAP in the last 168 hours. Iron Studies:  Recent Labs  08/14/14 2136  IRON 65  TIBC 280  FERRITIN 125   @lablastinr3 @ Studies/Results: No results found. Medications: . sodium chloride     . amLODipine  10 mg Oral QHS  . atorvastatin  40 mg Oral q1800  . darbepoetin (ARANESP) injection - DIALYSIS  200 mcg Intravenous Q Wed-HD  . hydrALAZINE  50 mg Oral BID  . metoprolol  200 mg Oral QHS  . multivitamin  1 tablet Oral QHS  . pantoprazole (PROTONIX) IV  40 mg Intravenous Q12H  . polyethylene glycol-electrolytes  4,000 mL Oral Once  . sodium chloride  3 mL Intravenous Q12H

## 2014-08-16 NOTE — Progress Notes (Signed)
Patient with order to receive blood products on Hemodialysis. Discussed with patient in the presence of Maryruth Hancock RN the new order for the transfusion of blood. Patient indicated that he was willing to do "whatever makes me feel better". Patient verbalized that he had to receive blood when he lost his arm. When asked "tell us what we just said the MD wanted to do today?" the patient stated "give me blood". Patient verbalized understanding and consent. Patient signed blood consent form. Jakara Blatter, Bryn Gulling

## 2014-08-17 ENCOUNTER — Encounter (HOSPITAL_COMMUNITY)
Admission: EM | Disposition: A | Discharge status: Home / Self Care | Payer: Self-pay | Source: Home / Self Care | Attending: Emergency Medicine

## 2014-08-17 ENCOUNTER — Encounter (HOSPITAL_COMMUNITY): Payer: Self-pay

## 2014-08-17 DIAGNOSIS — K5730 Diverticulosis of large intestine without perforation or abscess without bleeding: Secondary | ICD-10-CM | POA: Diagnosis not present

## 2014-08-17 DIAGNOSIS — D500 Iron deficiency anemia secondary to blood loss (chronic): Secondary | ICD-10-CM | POA: Diagnosis not present

## 2014-08-17 DIAGNOSIS — K921 Melena: Secondary | ICD-10-CM | POA: Diagnosis not present

## 2014-08-17 DIAGNOSIS — Z992 Dependence on renal dialysis: Secondary | ICD-10-CM | POA: Diagnosis not present

## 2014-08-17 DIAGNOSIS — N186 End stage renal disease: Secondary | ICD-10-CM | POA: Diagnosis not present

## 2014-08-17 DIAGNOSIS — D631 Anemia in chronic kidney disease: Secondary | ICD-10-CM | POA: Diagnosis not present

## 2014-08-17 DIAGNOSIS — N185 Chronic kidney disease, stage 5: Secondary | ICD-10-CM | POA: Diagnosis not present

## 2014-08-17 DIAGNOSIS — I10 Essential (primary) hypertension: Secondary | ICD-10-CM | POA: Diagnosis not present

## 2014-08-17 HISTORY — PX: COLONOSCOPY: SHX5424

## 2014-08-17 LAB — CBC
HCT: 28.8 % — ABNORMAL LOW (ref 39.0–52.0)
Hemoglobin: 9.9 g/dL — ABNORMAL LOW (ref 13.0–17.0)
MCH: 29.5 pg (ref 26.0–34.0)
MCHC: 34.4 g/dL (ref 30.0–36.0)
MCV: 85.7 fL (ref 78.0–100.0)
Platelets: 204 10*3/uL (ref 150–400)
RBC: 3.36 MIL/uL — ABNORMAL LOW (ref 4.22–5.81)
RDW: 21 % — AB (ref 11.5–15.5)
WBC: 7.9 10*3/uL (ref 4.0–10.5)

## 2014-08-17 LAB — TYPE AND SCREEN
ABO/RH(D): AB POS
Antibody Screen: NEGATIVE
UNIT DIVISION: 0

## 2014-08-17 SURGERY — COLONOSCOPY
Anesthesia: Moderate Sedation

## 2014-08-17 MED ORDER — FENTANYL CITRATE (PF) 100 MCG/2ML IJ SOLN
INTRAMUSCULAR | Status: DC | PRN
  Administered 2014-08-17 (×2): 25 ug via INTRAVENOUS

## 2014-08-17 MED ORDER — MIDAZOLAM HCL 5 MG/ML IJ SOLN
INTRAMUSCULAR | Status: AC
  Filled 2014-08-17: qty 2

## 2014-08-17 MED ORDER — MIDAZOLAM HCL 5 MG/5ML IJ SOLN
INTRAMUSCULAR | Status: DC | PRN
  Administered 2014-08-17 (×2): 2 mg via INTRAVENOUS

## 2014-08-17 MED ORDER — DIPHENHYDRAMINE HCL 50 MG/ML IJ SOLN
INTRAMUSCULAR | Status: AC
  Filled 2014-08-17: qty 1

## 2014-08-17 MED ORDER — FENTANYL CITRATE (PF) 100 MCG/2ML IJ SOLN
INTRAMUSCULAR | Status: AC
  Filled 2014-08-17: qty 2

## 2014-08-17 MED ORDER — NEPRO/CARBSTEADY PO LIQD
237.0000 mL | Freq: Three times a day (TID) | ORAL | Status: DC
  Administered 2014-08-17: 237 mL via ORAL

## 2014-08-17 MED ORDER — HYDRALAZINE HCL 25 MG PO TABS
25.0000 mg | ORAL_TABLET | Freq: Once | ORAL | Status: DC
  Filled 2014-08-17: qty 1

## 2014-08-17 NOTE — H&P (View-Only) (Signed)
Subjective:   No complaints, denies any further dark stools.   Objective Filed Vitals:   08/16/14 1658 08/16/14 2100 08/17/14 0500 08/17/14 0812  BP: 109/88 159/81 169/76 142/62  Pulse: 68 71 72 69  Temp:  98.6 F (37 C) 98.1 F (36.7 C) 98.6 F (37 C)  TempSrc:  Oral Oral Oral  Resp:  16 18 18   Height:      Weight: 50.7 kg (111 lb 12.4 oz) 51.3 kg (113 lb 1.5 oz)    SpO2: 98% 100% 100% 100%   Physical Exam General: alert and oriented. No acute distress.  Heart: RRR  Lungs: CTA, unlabored.  Abdomen: soft, nonteder +BS  Extremities: no edema. R arm ampuation Dialysis Access: L AVG +b/t  Dialysis Orders: MWF @ [facility] 3hr 45 mins 53kgs 2K/2Ca+ 5400u heparin L AVG Micera 200 (to start 5/11) Calcitriol 0.5 tsat 4/13 45- no FE Phos 2.3 5/4. Last pth 431  Assessment/Plan: 1. GI bleed- heme + stools, hgb 7.9- received 1 u RBC 5/11 in HD.Protonix. Give ESA 5/11. tsat 23-start weekly Fe. GI following. EGD 5/10- nonbleeding AVMs. Mild GAVE. For colonscopy Today 2. ESRD - MWF [facility], HD tomorrow, no heparin 3. Hypertension/volume - 142/62- amlodipine and metp. No volume excess. under edw by wts here 4. Metabolic bone disease - Ca+ 8.7 cont calcitriol. outpt phos has been less than 4 since Feb, now 5.8- restart binders when diet advanced. 5. Nutrition - NPO and supplements/vitamin 6. Lower ext ulcers- recently on doxycylcine 7. Hip pain- recently completed prednisone taper- oxycodone and tramadol  Shelle Iron, NP [place] [phone] 08/17/2014,9:00 AM  LOS: 3 days  I have seen and examined this patient and agree with plan per Shelle Iron.  Labs pending.  Denies any BRBPR.  For colonoscopy today. Marland Kitchen Brunella Wileman T,MD 08/17/2014 9:23 AM  Additional Objective Labs: Basic Metabolic Panel:  Recent Labs Lab 08/15/14 0720 08/15/14 1854 08/16/14 0443  NA 140 138 136  K 4.4 5.2* 5.7*  CL 95* 93* 94*  CO2 30 27 27   GLUCOSE 82 133* 78  BUN  34* 46* 53*  CREATININE 6.29* 7.54* 8.41*  CALCIUM 8.7* 8.9 8.5*  PHOS 4.7* 5.8*  --    Liver Function Tests:  Recent Labs Lab 08/14/14 1643 08/15/14 0720 08/15/14 1854  AST 33  --   --   ALT 11*  --   --   ALKPHOS 104  --   --   BILITOT 0.6  --   --   PROT 6.8  --   --   ALBUMIN 3.1* 2.9* 3.2*   No results for input(s): LIPASE, AMYLASE in the last 168 hours. CBC:  Recent Labs Lab 08/14/14 1643 08/14/14 2136 08/15/14 0720 08/15/14 1854 08/16/14 0443  WBC 7.6 7.7 6.2 7.5 7.2  NEUTROABS  --  5.3 4.1  --   --   HGB 8.5* 7.8* 8.2* 8.6* 7.9*  HCT 25.6* 23.6* 25.2* 26.6* 23.4*  MCV 91.1 91.5 91.0 92.0 90.0  PLT 294 292 283 285 263   Blood Culture    Component Value Date/Time   SDES BLOOD RIGHT LOWER LEG 06/08/2013 1615   SPECREQUEST BOTTLES DRAWN AEROBIC AND ANAEROBIC B 10CC R [place] 06/08/2013 1615   CULT  06/08/2013 1615    NO GROWTH 5 DAYS Performed at [place] 06/14/2013 FINAL 06/08/2013 1615    Cardiac Enzymes: No results for input(s): CKTOTAL, CKMB, CKMBINDEX, TROPONINI in the last 168 hours. CBG: No results for  input(s): GLUCAP in the last 168 hours. Iron Studies:  Recent Labs  08/14/14 2136  IRON 65  TIBC 280  FERRITIN 125   @lablastinr3 @ Studies/Results: No results found. Medications: . sodium chloride     . sodium chloride   Intravenous Once  . amLODipine  10 mg Oral QHS  . atorvastatin  40 mg Oral q1800  . darbepoetin (ARANESP) injection - DIALYSIS  200 mcg Intravenous Q Wed-HD  . ferric gluconate (FERRLECIT/NULECIT) IV  62.5 mg Intravenous Weekly  . hydrALAZINE  50 mg Oral BID  . metoprolol  200 mg Oral QHS  . multivitamin  1 tablet Oral QHS  . pantoprazole (PROTONIX) IV  40 mg Intravenous Q12H  . sodium chloride  3 mL Intravenous Q12H

## 2014-08-17 NOTE — Interval H&P Note (Signed)
History and Physical Interval Note:  08/17/2014 12:34 PM  Brad [name]  has presented today for surgery, with the diagnosis of Anemia, Melena, and Heme positive stool  The various methods of treatment have been discussed with the patient and family. After consideration of risks, benefits and other options for treatment, the patient has consented to  Procedure(s): COLONOSCOPY (N/A) as a surgical intervention .  The patient's history has been reviewed, patient examined, no change in status, stable for surgery.  I have reviewed the patient's chart and labs.  Questions were answered to the patient's satisfaction.     Jervis Trapani D

## 2014-08-17 NOTE — Progress Notes (Signed)
Subjective:   No complaints, denies any further dark stools.   Objective Filed Vitals:   08/16/14 1658 08/16/14 2100 08/17/14 0500 08/17/14 0812  BP: 109/88 159/81 169/76 142/62  Pulse: 68 71 72 69  Temp:  98.6 F (37 C) 98.1 F (36.7 C) 98.6 F (37 C)  TempSrc:  Oral Oral Oral  Resp:  16 18 18   Height:      Weight: 50.7 kg (111 lb 12.4 oz) 51.3 kg (113 lb 1.5 oz)    SpO2: 98% 100% 100% 100%   Physical Exam General: alert and oriented. No acute distress.  Heart: RRR  Lungs: CTA, unlabored.  Abdomen: soft, nonteder +BS  Extremities: no edema. R arm ampuation Dialysis Access: L AVG +b/t  Dialysis Orders: MWF @ [facility] 3hr 45 mins 53kgs 2K/2Ca+ 5400u heparin L AVG Micera 200 (to start 5/11) Calcitriol 0.5 tsat 4/13 45- no FE Phos 2.3 5/4. Last pth 431  Assessment/Plan: 1. GI bleed- heme + stools, hgb 7.9- received 1 u RBC 5/11 in HD.Protonix. Give ESA 5/11. tsat 23-start weekly Fe. GI following. EGD 5/10- nonbleeding AVMs. Mild GAVE. For colonscopy Today 2. ESRD - MWF [facility], HD tomorrow, no heparin 3. Hypertension/volume - 142/62- amlodipine and metp. No volume excess. under edw by wts here 4. Metabolic bone disease - Ca+ 8.7 cont calcitriol. outpt phos has been less than 4 since Feb, now 5.8- restart binders when diet advanced. 5. Nutrition - NPO and supplements/vitamin 6. Lower ext ulcers- recently on doxycylcine 7. Hip pain- recently completed prednisone taper- oxycodone and tramadol  Shelle Iron, NP [place] [phone] 08/17/2014,9:00 AM  LOS: 3 days  I have seen and examined this patient and agree with plan per Shelle Iron.  Labs pending.  Denies any BRBPR.  For colonoscopy today. Marland Kitchen Bayleigh Loflin T,MD 08/17/2014 9:23 AM  Additional Objective Labs: Basic Metabolic Panel:  Recent Labs Lab 08/15/14 0720 08/15/14 1854 08/16/14 0443  NA 140 138 136  K 4.4 5.2* 5.7*  CL 95* 93* 94*  CO2 30 27 27   GLUCOSE 82 133* 78  BUN  34* 46* 53*  CREATININE 6.29* 7.54* 8.41*  CALCIUM 8.7* 8.9 8.5*  PHOS 4.7* 5.8*  --    Liver Function Tests:  Recent Labs Lab 08/14/14 1643 08/15/14 0720 08/15/14 1854  AST 33  --   --   ALT 11*  --   --   ALKPHOS 104  --   --   BILITOT 0.6  --   --   PROT 6.8  --   --   ALBUMIN 3.1* 2.9* 3.2*   No results for input(s): LIPASE, AMYLASE in the last 168 hours. CBC:  Recent Labs Lab 08/14/14 1643 08/14/14 2136 08/15/14 0720 08/15/14 1854 08/16/14 0443  WBC 7.6 7.7 6.2 7.5 7.2  NEUTROABS  --  5.3 4.1  --   --   HGB 8.5* 7.8* 8.2* 8.6* 7.9*  HCT 25.6* 23.6* 25.2* 26.6* 23.4*  MCV 91.1 91.5 91.0 92.0 90.0  PLT 294 292 283 285 263   Blood Culture    Component Value Date/Time   SDES BLOOD RIGHT LOWER LEG 06/08/2013 1615   SPECREQUEST BOTTLES DRAWN AEROBIC AND ANAEROBIC B 10CC R [place] 06/08/2013 1615   CULT  06/08/2013 1615    NO GROWTH 5 DAYS Performed at [facility] 06/14/2013 FINAL 06/08/2013 1615    Cardiac Enzymes: No results for input(s): CKTOTAL, CKMB, CKMBINDEX, TROPONINI in the last 168 hours. CBG: No results for  input(s): GLUCAP in the last 168 hours. Iron Studies:  Recent Labs  08/14/14 2136  IRON 65  TIBC 280  FERRITIN 125   @lablastinr3 @ Studies/Results: No results found. Medications: . sodium chloride     . sodium chloride   Intravenous Once  . amLODipine  10 mg Oral QHS  . atorvastatin  40 mg Oral q1800  . darbepoetin (ARANESP) injection - DIALYSIS  200 mcg Intravenous Q Wed-HD  . ferric gluconate (FERRLECIT/NULECIT) IV  62.5 mg Intravenous Weekly  . hydrALAZINE  50 mg Oral BID  . metoprolol  200 mg Oral QHS  . multivitamin  1 tablet Oral QHS  . pantoprazole (PROTONIX) IV  40 mg Intravenous Q12H  . sodium chloride  3 mL Intravenous Q12H

## 2014-08-17 NOTE — Progress Notes (Addendum)
[facility] PROGRESS NOTE  Brad [name] TWS:[id number] DOB: [birth year] DOA: 08/14/2014 PCP: No primary care provider on file.  79 y/o ? h/o ESRD HD M/w/f @ [place], gout, HTN, right arm amputation, known history peptic stricture 10/15 status post dilatation + hiatal hernia + GAVE, prior right frontal infarct found in 2007, recent Rx MSSA wound near HD access-also Rx doxycycline 5/6 lower extremity ulcers admitted on 08/14/14 with colchicine-induced diarrhea and probable steroid induced gastritis in the setting of taking chronic NSAIDs for hip pain Ultimately underwent upper endoscopy this admission 5/7 which was non-revealing  Colonoscopy showed diverticula on 5/12 and Gi Dr. Benson [place] recomnede dot follow trend priorto d/c home  Assessment/Plan: 1. GI bleed -h/o peptic stricture in 10/15 s/p dilation, repeat endoscopy 5/10 = nonobstructive distal dysphagia structure nonbleeding fundic and duodenal AVM status post APC  -continue PPI Q12 -GI performed colonoscopy 08/17/14 showed scattered diverticula -Hb down to 7.6 from 10-11 ran-Patient transfused 1 unit PRBC 08/16/14 Hb up to 9.6  2. ESRD on HD MWF with hyperkalemia -renal notified -Appreciate input   3. Anemia  of acute blood loss superimposed on anemia of renal/chronic disease -Transfused 1 unit PRBC 5/11  -Tsat was 23 -check saturation levels 1-2 weeks -Iron/Aranesp per renal   4. Multiple foot ulcers -wound care, no s/s of cellulitis -stable  5. Severe P-energy malnutirtion -RD consult -significant wght loss since 06/2013~20 lbs -dentures apparently fit   6. Chronic hip and R shoulder pain -tylenol PRN -Avoid NSAIDs going forward  DVT proph: SCDs  Code Status: Full Code Family Communication: none at bedside (indicate person spoken with, relationship, and if by phone, the number) Disposition Plan: home pending workup   Consultants:  Gi Dr.Hung   HPI/Subjective:  Well ate full meal  No nv cp NO dark or tarry  stool No cp nor sob   Objective: Filed Vitals:   08/17/14 1411  BP: 166/71  Pulse: 69  Temp: 97.7 F (36.5 C)  Resp: 18    Intake/Output Summary (Last 24 hours) at 08/17/14 1621 Last data filed at 08/17/14 0017  Gross per 24 hour  Intake    360 ml  Output   2205 ml  Net  -1845 ml   Filed Weights   08/16/14 1300 08/16/14 1658 08/16/14 2100  Weight: 53.4 kg (117 lb 11.6 oz) 50.7 kg (111 lb 12.4 oz) 51.3 kg (113 lb 1.5 oz)    Exam:   General: AAOx3, emaciated and chronically ill appearing  Cardiovascular: S1s2/RRR  Respiratory: CTAB  Abdomen: soft, NT, BS present  Musculoskeletal: s/p R arm amputation, no edema c/c, small heel ulcers with dressing-non toxic appearing     Data Reviewed: Basic Metabolic Panel:  Recent Labs Lab 08/14/14 1643 08/15/14 0720 08/15/14 1854 08/16/14 0443  NA 140 140 138 136  K 3.8 4.4 5.2* 5.7*  CL 95* 95* 93* 94*  CO2 33* 30 27 27   GLUCOSE 87 82 133* 78  BUN 22* 34* 46* 53*  CREATININE 4.74* 6.29* 7.54* 8.41*  CALCIUM 8.5* 8.7* 8.9 8.5*  PHOS  --  4.7* 5.8*  --    Liver Function Tests:  Recent Labs Lab 08/14/14 1643 08/15/14 0720 08/15/14 1854  AST 33  --   --   ALT 11*  --   --   ALKPHOS 104  --   --   BILITOT 0.6  --   --   PROT 6.8  --   --   ALBUMIN 3.1* 2.9* 3.2*  No results for input(s): LIPASE, AMYLASE in the last 168 hours. No results for input(s): AMMONIA in the last 168 hours. CBC:  Recent Labs Lab 08/14/14 2136 08/15/14 0720 08/15/14 1854 08/16/14 0443 08/17/14 1100  WBC 7.7 6.2 7.5 7.2 7.9  NEUTROABS 5.3 4.1  --   --   --   HGB 7.8* 8.2* 8.6* 7.9* 9.9*  HCT 23.6* 25.2* 26.6* 23.4* 28.8*  MCV 91.5 91.0 92.0 90.0 85.7  PLT 292 283 285 263 204   Cardiac Enzymes: No results for input(s): CKTOTAL, CKMB, CKMBINDEX, TROPONINI in the last 168 hours. BNP (last 3 results) No results for input(s): BNP in the last 8760 hours.  ProBNP (last 3 results) No results for input(s): PROBNP in the last  8760 hours.  CBG: No results for input(s): GLUCAP in the last 168 hours.  Recent Results (from the past 240 hour(s))  MRSA PCR Screening     Status: None   Collection Time: 08/14/14 11:05 PM  Result Value Ref Range Status   MRSA by PCR NEGATIVE NEGATIVE Final    Comment:        The GeneXpert MRSA Assay (FDA approved for NASAL specimens only), is one component of a comprehensive MRSA colonization surveillance program. It is not intended to diagnose MRSA infection nor to guide or monitor treatment for MRSA infections.      Studies: No results found.  Scheduled Meds: . amLODipine  10 mg Oral QHS  . atorvastatin  40 mg Oral q1800  . darbepoetin (ARANESP) injection - DIALYSIS  200 mcg Intravenous Q Wed-HD  . ferric gluconate (FERRLECIT/NULECIT) IV  62.5 mg Intravenous Weekly  . hydrALAZINE  25 mg Oral Once  . hydrALAZINE  50 mg Oral BID  . metoprolol  200 mg Oral QHS  . multivitamin  1 tablet Oral QHS  . pantoprazole (PROTONIX) IV  40 mg Intravenous Q12H  . sodium chloride  3 mL Intravenous Q12H   Continuous Infusions:   Antibiotics Given (last 72 hours)    None      Principal Problem:   GI bleed Active Problems:   HYPERTENSION, BENIGN SYSTEMIC   CHRONIC KIDNEY DISEASE STAGE V   ESRD on dialysis   Anemia of chronic renal failure, stage 5   Protein-calorie malnutrition, severe    Time spent: 35 minutes greater than 50% of which was face-to-face time with family discussing plan of care and explaining diverticular disease and other issues   Verneita Griffes, MD [facility] (P) [phone]

## 2014-08-17 NOTE — Op Note (Signed)
[facility] [place], [id number]   COLONOSCOPY PROCEDURE REPORT  PATIENT: [name], Brad  MR#: [id number] BIRTHDATE: 02/25/1936 , 55  yrs. old GENDER: male ENDOSCOPIST: Carol Ada, MD REFERRED BY: PROCEDURE DATE:  08-19-2014 PROCEDURE:   Colonoscopy, diagnostic ASA CLASS:   Class III INDICATIONS: Anemia, Melena, and Heme positive stool MEDICATIONS: Fentanyl 50 mcg IV and Versed 5 mg IV  DESCRIPTION OF PROCEDURE:   After the risks and benefits and of the procedure were explained, informed consent was obtained.  revealed no abnormalities of the rectum.    The Pentax Ped Colon [id number] endoscope was introduced through the anus and advanced to the terminal ileum which was intubated for a short distance .  The quality of the prep was good. .  The instrument was then slowly withdrawn as the colon was fully examined. Estimated blood loss is zero unless otherwise noted in this procedure report.   FINDINGS: A couple of diverticula were noted in the sigmoid colon. No evidence of any masses, inflammation, ulcerations, erosions, polyps, or vascular abnormalities.     Retroflexed views revealed no abnormalities.     The scope was then withdrawn from the patient and the procedure completed.  WITHDRAWAL TIME:  COMPLICATIONS: There were no immediate complications. ENDOSCOPIC IMPRESSION: 1) Diverticula. RECOMMENDATIONS: 1) Follow HGB and transfuse as necessary. 2) If symptoms recur, a capsule endoscopy will be pursued. 3) Signing off and follow up in the office in one month.  REPEAT EXAM:  cc:  _______________________________ eSignedCarol Ada, MD 19-Aug-2014 1:22 PM   CPT CODES: ICD CODES:  The ICD and CPT codes recommended by this software are interpretations from the data that the clinical staff has captured with the software.  The verification of the translation of this report to the ICD and CPT codes and modifiers is the  sole responsibility of the health care institution and practicing physician where this report was generated.  [employer] will not be held responsible for the validity of the ICD and CPT codes included on this report.  AMA assumes no liability for data contained or not contained herein. CPT is a Designer, television/film set of the [employer].   PATIENT NAME:  Brad, [name] MR#: [id number]

## 2014-08-18 ENCOUNTER — Encounter (HOSPITAL_COMMUNITY): Payer: Self-pay | Admitting: Gastroenterology

## 2014-08-18 DIAGNOSIS — I10 Essential (primary) hypertension: Secondary | ICD-10-CM | POA: Diagnosis not present

## 2014-08-18 DIAGNOSIS — N186 End stage renal disease: Secondary | ICD-10-CM | POA: Diagnosis not present

## 2014-08-18 DIAGNOSIS — N185 Chronic kidney disease, stage 5: Secondary | ICD-10-CM | POA: Diagnosis not present

## 2014-08-18 DIAGNOSIS — K921 Melena: Secondary | ICD-10-CM | POA: Diagnosis not present

## 2014-08-18 DIAGNOSIS — Z992 Dependence on renal dialysis: Secondary | ICD-10-CM | POA: Diagnosis not present

## 2014-08-18 LAB — RENAL FUNCTION PANEL
ANION GAP: 12 (ref 5–15)
Albumin: 2.8 g/dL — ABNORMAL LOW (ref 3.5–5.0)
BUN: 55 mg/dL — AB (ref 6–20)
CHLORIDE: 98 mmol/L — AB (ref 101–111)
CO2: 22 mmol/L (ref 22–32)
CREATININE: 8.8 mg/dL — AB (ref 0.61–1.24)
Calcium: 7.7 mg/dL — ABNORMAL LOW (ref 8.9–10.3)
GFR, EST AFRICAN AMERICAN: 6 mL/min — AB (ref >60)
GFR, EST NON AFRICAN AMERICAN: 5 mL/min — AB (ref >60)
GLUCOSE: 116 mg/dL — AB (ref 65–99)
PHOSPHORUS: 5.6 mg/dL — AB (ref 2.5–4.6)
Potassium: 4.9 mmol/L (ref 3.5–5.1)
Sodium: 132 mmol/L — ABNORMAL LOW (ref 135–145)

## 2014-08-18 LAB — CBC
HCT: 26.9 % — ABNORMAL LOW (ref 39.0–52.0)
HEMOGLOBIN: 9.3 g/dL — AB (ref 13.0–17.0)
MCH: 29.7 pg (ref 26.0–34.0)
MCHC: 34.6 g/dL (ref 30.0–36.0)
MCV: 85.9 fL (ref 78.0–100.0)
PLATELETS: 200 10*3/uL (ref 150–400)
RBC: 3.13 MIL/uL — AB (ref 4.22–5.81)
RDW: 20.5 % — ABNORMAL HIGH (ref 11.5–15.5)
WBC: 7.2 10*3/uL (ref 4.0–10.5)

## 2014-08-18 MED ORDER — NEPRO/CARBSTEADY PO LIQD
237.0000 mL | ORAL | Status: DC | PRN
  Filled 2014-08-18: qty 237

## 2014-08-18 MED ORDER — SODIUM CHLORIDE 0.9 % IV SOLN: 100.0000 mL | INTRAVENOUS | Status: DC | PRN

## 2014-08-18 MED ORDER — NEPRO/CARBSTEADY PO LIQD: 237.0000 mL | Freq: Three times a day (TID) | ORAL | Status: DC

## 2014-08-18 MED ORDER — ALTEPLASE 2 MG IJ SOLR
2.0000 mg | Freq: Once | INTRAMUSCULAR | Status: DC | PRN
  Filled 2014-08-18: qty 2

## 2014-08-18 MED ORDER — SEVELAMER CARBONATE 2.4 G PO PACK
2.4000 g | PACK | Freq: Three times a day (TID) | ORAL | Status: DC
  Filled 2014-08-18 (×4): qty 1

## 2014-08-18 MED ORDER — LIDOCAINE-PRILOCAINE 2.5-2.5 % EX CREA
1.0000 "application " | TOPICAL_CREAM | CUTANEOUS | Status: DC | PRN
  Filled 2014-08-18: qty 5

## 2014-08-18 MED ORDER — LIDOCAINE HCL (PF) 1 % IJ SOLN: 5.0000 mL | INTRAMUSCULAR | Status: DC | PRN

## 2014-08-18 MED ORDER — HEPARIN SODIUM (PORCINE) 1000 UNIT/ML DIALYSIS: 1000.0000 [IU] | INTRAMUSCULAR | Status: DC | PRN

## 2014-08-18 MED ORDER — PENTAFLUOROPROP-TETRAFLUOROETH EX AERO: 1.0000 "application " | INHALATION_SPRAY | CUTANEOUS | Status: DC | PRN

## 2014-08-18 NOTE — Progress Notes (Signed)
Pt discharge instructions given, pt verbalized understanding.  VSS. Denies pain.  Pt left floor via wheelchair accompanied by staff and family. 

## 2014-08-18 NOTE — Discharge Summary (Signed)
Physician Discharge Summary  Brad [name] MLY:[id number] DOB: Jul 17, 1935 DOA: 08/14/2014  PCP: No primary care provider on file.  Admit date: 08/14/2014 Discharge date: 08/18/2014  Time spent: 25 minutes  Recommendations for Outpatient Follow-up:  1. Needs OP follow up with Dr. Benson Norway consideration Capsule endo 2. Cbc 5/18 3. Check Tsat at that time 4. Nepro carb steady added this admission-all other meds the same   Discharge Diagnoses:  Principal Problem:   GI bleed Active Problems:   HYPERTENSION, BENIGN SYSTEMIC   CHRONIC KIDNEY DISEASE STAGE V   ESRD on dialysis   Anemia of chronic renal failure, stage 5   Protein-calorie malnutrition, severe   Discharge Condition: fair  Diet recommendation: renal  Filed Weights   08/16/14 2100 08/17/14 2100 08/18/14 0911  Weight: 51.3 kg (113 lb 1.5 oz) 51.8 kg (114 lb 3.2 oz) 52.8 kg (116 lb 6.5 oz)    History of present illness:  79 y/o ? 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 GI bleed -h/o peptic stricture in 10/15 s/p dilation,  -repeat endoscopy 5/10 = nonobstructive distal dysphagia structure nonbleeding fundic and duodenal AVM status post APC   -GI performed colonoscopy 08/17/14 showed scattered diverticula -continue PPI Q12 -Hb down to 7.6 from 10-11 ran-Patient transfused 1 unit PRBC 08/16/14 Hb up to 9.6 -hemodynamically stable and no further dark stools ? able to d/c home -Dr. Benson Norway has indicated if this  recurs would need [facility]/Capsul endo-I will cc this note to him to be aware -next CBC to be done on Wed 18th at [facility]  2. ESRD on HD MWF with hyperkalemia -renal Appreciated   3. Anemia  of acute blood loss superimposed on anemia of renal/chronic disease -Transfused 1 unit PRBC 5/11   -Tsat was 23 -check saturation levels 1-2 weeks -Iron/Aranesp per renal  4. Multiple foot ulcers -wound care, no s/s of cellulitis -stable  5. Severe P-energy malnutirtion -RD consult -significant wght loss since 06/2013~20 lbs -dentures apparently fit  -nepro on d/c home  6. Chronic hip and R shoulder pain -tylenol PRN -Avoid NSAIDs going forward   Procedures: ENDOSCOPIC IMPRESSION 5/10 1) Nonobstructive distal esophageal stricture. 2) Nonbleeding fundic and duodenal AVMs s/p APC. 3) Mild GAVE.  colo IMPRESSION 5/12 1) Diverticula  Consultants: 2. Gi Dr.Hung   Discharge Exam: Filed Vitals:   08/18/14 0930  BP: 135/67  Pulse: 64  Temp:   Resp:     General: alert pleasant oriented in nad-seen ion dialsis unit.  No specific other issues No dark stools eating full meals Cardiovascular:  s1 s 2no m/r/g Respiratory:  clear  Discharge Instructions   Discharge Instructions    Diet - low sodium heart healthy    Complete by:  As directed      Discharge instructions    Complete by:  As directed   Follow up c Dr. Benson Norway if symptoms recur-you might need another test Get blood count 1 week at dialysis Continue regular dialysis m/w/f     Increase activity slowly  Complete by:  As directed           Current Discharge Medication List    START taking these medications   Details  Nutritional Supplements (FEEDING SUPPLEMENT, NEPRO CARB STEADY,) LIQD Take 237 mLs by mouth 3 (three) times daily between meals. Qty: 9 Can, Refills: 0      CONTINUE these medications which have NOT CHANGED   Details  acetaminophen (TYLENOL) 500 MG tablet Take 1,000 mg by mouth every 6 (six) hours  as needed for mild pain or moderate pain.    amLODipine (NORVASC) 10 MG tablet Take 10 mg by mouth at bedtime. For blood pressure     atorvastatin (LIPITOR) 40 MG tablet Take 40 mg by mouth daily.     B Complex-C-Folic Acid (DIALYVITE TABLET) TABS Take 1 tablet by mouth daily.      calcium acetate (PHOSLO) 667 MG capsule Take 1,334 mg by mouth 3 (three) times daily with meals. And 1 cap with snack    colchicine 0.6 MG tablet Take 0.6 mg by mouth 3 (three) times a week. Monday, wednesday, friday    hydrALAZINE (APRESOLINE) 50 MG tablet Take 50 mg by mouth 2 (two) times daily.    metoprolol (TOPROL-XL) 200 MG 24 hr tablet Take 200 mg by mouth at bedtime.     omeprazole (PRILOSEC) 20 MG capsule Take 20 mg by mouth daily.      oxyCODONE (ROXICODONE) 5 MG immediate release tablet Take 1 tablet (5 mg total) by mouth every 6 (six) hours as needed for severe pain. Qty: 10 tablet, Refills: 0    traMADol (ULTRAM) 50 MG tablet Take 1 tablet (50 mg total) by mouth every 12 (twelve) hours as needed for moderate pain or severe pain. Qty: 20 tablet, Refills: 0    triamcinolone cream (KENALOG) 0.1 % Apply 1 application topically 2 (two) times daily.       STOP taking these medications     doxycycline (VIBRAMYCIN) 100 MG capsule      oseltamivir (TAMIFLU) 30 MG capsule        No Known Allergies    The results of significant diagnostics from this hospitalization (including imaging, microbiology, ancillary and laboratory) are listed below for reference.    Significant Diagnostic Studies: Dg Shoulder Right  07/22/2014   CLINICAL DATA:  79 year old male with 1 month history of right shoulder pain. No known injury. History of prior partial right arm amputation 1997  EXAM: RIGHT SHOULDER - 2+ VIEW  COMPARISON:  None.  FINDINGS: Well-healed amputation site proximal humeral diaphysis. No evidence of acute fracture or malalignment. Relative sclerosis in the posterior aspect of the femoral head  which has a nonspecific appearance. The visualized thorax is within normal limits. No suspicious pulmonary nodule or mass. No evidence of rib fracture.  IMPRESSION: 1. Nonspecific sclerosis in the posterior aspect of the humeral head articular surface. Differential considerations include avascular necrosis (bone infarct), asymmetrical osteoporosis, or less likely a sclerotic metastasis. The imaging characteristics do not appear aggressive. If further imaging is clinically desired consider shoulder MRI. 2. Well-healed proximal upper arm amputation site.   Electronically Signed   By: Jacqulynn Cadet M.D.   On: 07/22/2014 10:56   Dg Hip Unilat With Pelvis 2-3 Views Left  07/22/2014   CLINICAL DATA:  Left hip pain for 1 month, no known injury, initial encounter  EXAM: LEFT HIP (WITH PELVIS) 2-3 VIEWS  COMPARISON:  None.  FINDINGS: Pelvic ring is intact. Degenerative changes in the lower lumbar  spine are seen. No acute fracture or dislocation is noted. No soft tissue changes are seen.  IMPRESSION: No acute abnormality noted.   Electronically Signed   By: Inez Catalina M.D.   On: 07/22/2014 10:51    Microbiology: Recent Results (from the past 240 hour(s))  MRSA PCR Screening     Status: None   Collection Time: 08/14/14 11:05 PM  Result Value Ref Range Status   MRSA by PCR NEGATIVE NEGATIVE Final    Comment:        The GeneXpert MRSA Assay (FDA approved for NASAL specimens only), is one component of a comprehensive MRSA colonization surveillance program. It is not intended to diagnose MRSA infection nor to guide or monitor treatment for MRSA infections.      Labs: Basic Metabolic Panel:  Recent Labs Lab 08/14/14 1643 08/15/14 0720 08/15/14 1854 08/16/14 0443  NA 140 140 138 136  K 3.8 4.4 5.2* 5.7*  CL 95* 95* 93* 94*  CO2 33* 30 27 27   GLUCOSE 87 82 133* 78  BUN 22* 34* 46* 53*  CREATININE 4.74* 6.29* 7.54* 8.41*  CALCIUM 8.5* 8.7* 8.9 8.5*  PHOS  --  4.7* 5.8*  --    Liver  Function Tests:  Recent Labs Lab 08/14/14 1643 08/15/14 0720 08/15/14 1854  AST 33  --   --   ALT 11*  --   --   ALKPHOS 104  --   --   BILITOT 0.6  --   --   PROT 6.8  --   --   ALBUMIN 3.1* 2.9* 3.2*   No results for input(s): LIPASE, AMYLASE in the last 168 hours. No results for input(s): AMMONIA in the last 168 hours. CBC:  Recent Labs Lab 08/14/14 2136 08/15/14 0720 08/15/14 1854 08/16/14 0443 08/17/14 1100 08/18/14 0830  WBC 7.7 6.2 7.5 7.2 7.9 7.2  NEUTROABS 5.3 4.1  --   --   --   --   HGB 7.8* 8.2* 8.6* 7.9* 9.9* 9.3*  HCT 23.6* 25.2* 26.6* 23.4* 28.8* 26.9*  MCV 91.5 91.0 92.0 90.0 85.7 85.9  PLT 292 283 285 263 204 200   Cardiac Enzymes: No results for input(s): CKTOTAL, CKMB, CKMBINDEX, TROPONINI in the last 168 hours. BNP: BNP (last 3 results) No results for input(s): BNP in the last 8760 hours.  ProBNP (last 3 results) No results for input(s): PROBNP in the last 8760 hours.  CBG: No results for input(s): GLUCAP in the last 168 hours.     SignedNita Sells  [facility] 08/18/2014, 9:53 AM

## 2014-08-18 NOTE — Progress Notes (Signed)
Subjective:  No current complaints, feeling better  Objective: Vital signs in last 24 hours: Temp:  [97.7 F (36.5 C)-99.2 F (37.3 C)] 98.4 F (36.9 C) (05/13 0500) Pulse Rate:  [65-79] 68 (05/13 0500) Resp:  [12-22] 16 (05/13 0500) BP: (112-192)/(55-108) 128/61 mmHg (05/13 0500) SpO2:  [94 %-100 %] 100 % (05/13 0500) Weight:  [51.8 kg (114 lb 3.2 oz)] 51.8 kg (114 lb 3.2 oz) (05/12 2100) Weight change: -1.6 kg (-3 lb 8.4 oz)  Intake/Output from previous day: 05/12 0701 - 05/13 0700 In: 480 [P.O.:480] Out: 203 [Urine:200; Stool:3] Intake/Output this shift:   Lab Results:  Recent Labs  08/16/14 0443 08/17/14 1100  WBC 7.2 7.9  HGB 7.9* 9.9*  HCT 23.4* 28.8*  PLT 263 204   BMET:  Recent Labs  08/15/14 1854 08/16/14 0443  NA 138 136  K 5.2* 5.7*  CL 93* 94*  CO2 27 27  GLUCOSE 133* 78  BUN 46* 53*  CREATININE 7.54* 8.41*  CALCIUM 8.9 8.5*  ALBUMIN 3.2*  --    No results for input(s): PTH in the last 72 hours. Iron Studies: No results for input(s): IRON, TIBC, TRANSFERRIN, FERRITIN in the last 72 hours.  Studies/Results: No results found.   EXAM: General appearance:  Alert, in no apparent distress Resp:  CTA without rales, rhonchi, or wheezes Cardio:  RRR without murmur or rub GI:  + BS, soft and nontender Extremities:  No edema, R arm amputation Access:  AVF @ LFA with + bruit  Dialysis Orders: MWF @ [facility] 3hr 45 mins 53kgs 2K/2Ca+ 5400u heparin L AVG Micera 200 (to start 5/11) Calcitriol 0.5 tsat 4/13 45- no FE Phos 2.3 5/4. Last pth 431  Assessment/Plan: 1. GI bleed - Heme + stools; EGD 5/10 showed nonbleeding AVMs, mild GAVE; colonoscopy 5/12 showed diverticula; if symptoms recur, capsule endoscopy per Dr. Benson Norway.  2. Anemia - sec to chronic disease & GIB, Hgb up to 9.1 s/p 1 U PRBCs 5/11, s/p Aranesp 200 mcg 5/11, weekly Fe. 3. ESRD - HD on MWF @ [place].  HD pending today. 4. HTN/Volume - BP 128/61 on Amlodipine 10 mg qhs, Hydralazine 50 mg  bid, Metoprolol 200 mg qhs; wt 51.8 kg, no excess fluid. 5. Sec HPT - Ca 8.5 (9.1 corrected), P 5.8; no Vitamin D, on Renvela powder with meals. 6. Nutrition - Alb 3.2, renal carb-mod diet, vitamin. 7. Multiple foot ulcers - healing, previously on Doxycycline.   LOS: 4 days   LYLES,CHARLES 08/18/2014,7:39 AM I have seen and examined this patient and agree with plan per Ramiro Harvest  Pt seen on HD. Ap 60 Vp 190  BFR 400.  Will not use heparin at HD x 2 weeks then re assess. Elad Macphail T,MD 08/18/2014 10:08 AM

## 2014-08-18 NOTE — Care Management Note (Signed)
Case Management Note  Patient Details  Name: Brad [name] MRN: [id number] Date of Birth: 1935-04-14  Subjective/Objective:                    Action/Plan:  08/18/2014 Met with pt and family and Condition Code 44 explanation given to pt and family, form signed , copy given to pt, one on chart and one to CM adm.  Expected Discharge Date:       08/18/2014           Expected Discharge Plan:     In-House Referral:     Discharge planning Services     Post Acute Care Choice:    Choice offered to:     DME Arranged:    DME Agency:     HH Arranged:    HH Agency:     Status of Service:     Medicare Important Message Given:   No as pt has changed to OBS Date Medicare IM Given:    Medicare IM give by:    Date Additional Medicare IM Given:    Additional Medicare Important Message give by:     If discussed at [place] of Stay Meetings, dates discussed:    Additional Comments:  Adron Bene, RN 08/18/2014, 3:34 PM

## 2014-08-21 DIAGNOSIS — L03039 Cellulitis of unspecified toe: Secondary | ICD-10-CM | POA: Diagnosis not present

## 2014-08-21 DIAGNOSIS — R51 Headache: Secondary | ICD-10-CM | POA: Diagnosis not present

## 2014-08-21 DIAGNOSIS — D509 Iron deficiency anemia, unspecified: Secondary | ICD-10-CM | POA: Diagnosis not present

## 2014-08-21 DIAGNOSIS — S41101A Unspecified open wound of right upper arm, initial encounter: Secondary | ICD-10-CM | POA: Diagnosis not present

## 2014-08-21 DIAGNOSIS — D688 Other specified coagulation defects: Secondary | ICD-10-CM | POA: Diagnosis not present

## 2014-08-21 DIAGNOSIS — R52 Pain, unspecified: Secondary | ICD-10-CM | POA: Diagnosis not present

## 2014-08-21 DIAGNOSIS — D689 Coagulation defect, unspecified: Secondary | ICD-10-CM | POA: Diagnosis not present

## 2014-08-21 DIAGNOSIS — N186 End stage renal disease: Secondary | ICD-10-CM | POA: Diagnosis not present

## 2014-08-21 DIAGNOSIS — N2581 Secondary hyperparathyroidism of renal origin: Secondary | ICD-10-CM | POA: Diagnosis not present

## 2014-08-21 DIAGNOSIS — D631 Anemia in chronic kidney disease: Secondary | ICD-10-CM | POA: Diagnosis not present

## 2014-08-23 DIAGNOSIS — R51 Headache: Secondary | ICD-10-CM | POA: Diagnosis not present

## 2014-08-23 DIAGNOSIS — R52 Pain, unspecified: Secondary | ICD-10-CM | POA: Diagnosis not present

## 2014-08-23 DIAGNOSIS — L03039 Cellulitis of unspecified toe: Secondary | ICD-10-CM | POA: Diagnosis not present

## 2014-08-23 DIAGNOSIS — D688 Other specified coagulation defects: Secondary | ICD-10-CM | POA: Diagnosis not present

## 2014-08-23 DIAGNOSIS — S41101A Unspecified open wound of right upper arm, initial encounter: Secondary | ICD-10-CM | POA: Diagnosis not present

## 2014-08-23 DIAGNOSIS — D631 Anemia in chronic kidney disease: Secondary | ICD-10-CM | POA: Diagnosis not present

## 2014-08-23 DIAGNOSIS — N2581 Secondary hyperparathyroidism of renal origin: Secondary | ICD-10-CM | POA: Diagnosis not present

## 2014-08-23 DIAGNOSIS — D509 Iron deficiency anemia, unspecified: Secondary | ICD-10-CM | POA: Diagnosis not present

## 2014-08-23 DIAGNOSIS — D689 Coagulation defect, unspecified: Secondary | ICD-10-CM | POA: Diagnosis not present

## 2014-08-23 DIAGNOSIS — N186 End stage renal disease: Secondary | ICD-10-CM | POA: Diagnosis not present

## 2014-08-25 DIAGNOSIS — R52 Pain, unspecified: Secondary | ICD-10-CM | POA: Diagnosis not present

## 2014-08-25 DIAGNOSIS — L03039 Cellulitis of unspecified toe: Secondary | ICD-10-CM | POA: Diagnosis not present

## 2014-08-25 DIAGNOSIS — D631 Anemia in chronic kidney disease: Secondary | ICD-10-CM | POA: Diagnosis not present

## 2014-08-25 DIAGNOSIS — D689 Coagulation defect, unspecified: Secondary | ICD-10-CM | POA: Diagnosis not present

## 2014-08-25 DIAGNOSIS — N186 End stage renal disease: Secondary | ICD-10-CM | POA: Diagnosis not present

## 2014-08-25 DIAGNOSIS — D688 Other specified coagulation defects: Secondary | ICD-10-CM | POA: Diagnosis not present

## 2014-08-25 DIAGNOSIS — D509 Iron deficiency anemia, unspecified: Secondary | ICD-10-CM | POA: Diagnosis not present

## 2014-08-25 DIAGNOSIS — S41101A Unspecified open wound of right upper arm, initial encounter: Secondary | ICD-10-CM | POA: Diagnosis not present

## 2014-08-25 DIAGNOSIS — R51 Headache: Secondary | ICD-10-CM | POA: Diagnosis not present

## 2014-08-25 DIAGNOSIS — N2581 Secondary hyperparathyroidism of renal origin: Secondary | ICD-10-CM | POA: Diagnosis not present

## 2014-08-28 DIAGNOSIS — D631 Anemia in chronic kidney disease: Secondary | ICD-10-CM | POA: Diagnosis not present

## 2014-08-28 DIAGNOSIS — N186 End stage renal disease: Secondary | ICD-10-CM | POA: Diagnosis not present

## 2014-08-28 DIAGNOSIS — N2581 Secondary hyperparathyroidism of renal origin: Secondary | ICD-10-CM | POA: Diagnosis not present

## 2014-08-28 DIAGNOSIS — S41101A Unspecified open wound of right upper arm, initial encounter: Secondary | ICD-10-CM | POA: Diagnosis not present

## 2014-08-28 DIAGNOSIS — L03039 Cellulitis of unspecified toe: Secondary | ICD-10-CM | POA: Diagnosis not present

## 2014-08-28 DIAGNOSIS — R51 Headache: Secondary | ICD-10-CM | POA: Diagnosis not present

## 2014-08-28 DIAGNOSIS — R52 Pain, unspecified: Secondary | ICD-10-CM | POA: Diagnosis not present

## 2014-08-28 DIAGNOSIS — D689 Coagulation defect, unspecified: Secondary | ICD-10-CM | POA: Diagnosis not present

## 2014-08-28 DIAGNOSIS — D688 Other specified coagulation defects: Secondary | ICD-10-CM | POA: Diagnosis not present

## 2014-08-28 DIAGNOSIS — D509 Iron deficiency anemia, unspecified: Secondary | ICD-10-CM | POA: Diagnosis not present

## 2014-08-30 DIAGNOSIS — R51 Headache: Secondary | ICD-10-CM | POA: Diagnosis not present

## 2014-08-30 DIAGNOSIS — S41101A Unspecified open wound of right upper arm, initial encounter: Secondary | ICD-10-CM | POA: Diagnosis not present

## 2014-08-30 DIAGNOSIS — R52 Pain, unspecified: Secondary | ICD-10-CM | POA: Diagnosis not present

## 2014-08-30 DIAGNOSIS — D688 Other specified coagulation defects: Secondary | ICD-10-CM | POA: Diagnosis not present

## 2014-08-30 DIAGNOSIS — N186 End stage renal disease: Secondary | ICD-10-CM | POA: Diagnosis not present

## 2014-08-30 DIAGNOSIS — N2581 Secondary hyperparathyroidism of renal origin: Secondary | ICD-10-CM | POA: Diagnosis not present

## 2014-08-30 DIAGNOSIS — D689 Coagulation defect, unspecified: Secondary | ICD-10-CM | POA: Diagnosis not present

## 2014-08-30 DIAGNOSIS — L03039 Cellulitis of unspecified toe: Secondary | ICD-10-CM | POA: Diagnosis not present

## 2014-08-30 DIAGNOSIS — D509 Iron deficiency anemia, unspecified: Secondary | ICD-10-CM | POA: Diagnosis not present

## 2014-08-30 DIAGNOSIS — D631 Anemia in chronic kidney disease: Secondary | ICD-10-CM | POA: Diagnosis not present

## 2014-09-01 DIAGNOSIS — S41101A Unspecified open wound of right upper arm, initial encounter: Secondary | ICD-10-CM | POA: Diagnosis not present

## 2014-09-01 DIAGNOSIS — N186 End stage renal disease: Secondary | ICD-10-CM | POA: Diagnosis not present

## 2014-09-01 DIAGNOSIS — R51 Headache: Secondary | ICD-10-CM | POA: Diagnosis not present

## 2014-09-01 DIAGNOSIS — D509 Iron deficiency anemia, unspecified: Secondary | ICD-10-CM | POA: Diagnosis not present

## 2014-09-01 DIAGNOSIS — D688 Other specified coagulation defects: Secondary | ICD-10-CM | POA: Diagnosis not present

## 2014-09-01 DIAGNOSIS — L03039 Cellulitis of unspecified toe: Secondary | ICD-10-CM | POA: Diagnosis not present

## 2014-09-01 DIAGNOSIS — D631 Anemia in chronic kidney disease: Secondary | ICD-10-CM | POA: Diagnosis not present

## 2014-09-01 DIAGNOSIS — R52 Pain, unspecified: Secondary | ICD-10-CM | POA: Diagnosis not present

## 2014-09-01 DIAGNOSIS — D689 Coagulation defect, unspecified: Secondary | ICD-10-CM | POA: Diagnosis not present

## 2014-09-01 DIAGNOSIS — N2581 Secondary hyperparathyroidism of renal origin: Secondary | ICD-10-CM | POA: Diagnosis not present

## 2014-09-03 ENCOUNTER — Emergency Department (INDEPENDENT_AMBULATORY_CARE_PROVIDER_SITE_OTHER): Payer: Medicare Other

## 2014-09-03 ENCOUNTER — Emergency Department (HOSPITAL_COMMUNITY)
Admission: EM | Admit: 2014-09-03 | Discharge: 2014-09-03 | Disposition: A | Discharge status: Home / Self Care | Payer: Medicare Other | Source: Home / Self Care | Attending: Family Medicine | Admitting: Family Medicine

## 2014-09-03 ENCOUNTER — Encounter (HOSPITAL_COMMUNITY): Payer: Self-pay | Admitting: Emergency Medicine

## 2014-09-03 DIAGNOSIS — M1612 Unilateral primary osteoarthritis, left hip: Secondary | ICD-10-CM | POA: Diagnosis not present

## 2014-09-03 DIAGNOSIS — R52 Pain, unspecified: Secondary | ICD-10-CM

## 2014-09-03 DIAGNOSIS — M1990 Unspecified osteoarthritis, unspecified site: Secondary | ICD-10-CM | POA: Diagnosis not present

## 2014-09-03 MED ORDER — HYDROCODONE-ACETAMINOPHEN 5-325 MG PO TABS: 1.0000 | ORAL_TABLET | Freq: Four times a day (QID) | ORAL | Status: DC | PRN

## 2014-09-03 NOTE — ED Provider Notes (Signed)
CSN: [id number]     Arrival date & time 09/03/14  1419 History   First MD Initiated Contact with Patient 09/03/14 1456     Chief Complaint  Patient presents with  . Hip Pain   (Consider location/radiation/quality/duration/timing/severity/associated sxs/prior Treatment) Patient is a 79 y.o. male presenting with hip pain. The history is provided by the patient and the spouse.  Hip Pain This is a chronic problem. The current episode started 6 to 12 hours ago (was up walking yest, worse this am trying to get oob.). The problem has been gradually worsening. The symptoms are aggravated by walking and standing.    Past Medical History  Diagnosis Date  . Hypertension   . High cholesterol   . Gout   . Anemia   . Renal disorder     End stage renal disease secondary to HTN nephrosclerosis  . Secondary hyperparathyroidism   . Hypocalcemia   . Colon polyps   . Paroxysmal supraventricular tachycardia   . Status post dilatation of esophageal stricture   . Syncope    Past Surgical History  Procedure Laterality Date  . Right arm amputation     . Revision of arteriovenous goretex graft Left 12/15/2012    Procedure: REVISION OF ARTERIOVENOUS GORETEX GRAFT using 19mm x 20cm Gortex graft;  Surgeon: Mal Misty, MD;  Location: [place];  Service: Vascular;  Laterality: Left;  . Esophagogastroduodenoscopy N/A 01/27/2014    Procedure: ESOPHAGOGASTRODUODENOSCOPY (EGD);  Surgeon: Beryle Beams, MD;  Location: [facility];  Service: Endoscopy;  Laterality: N/A;  . Esophageal dilation N/A 01/27/2014    Procedure: ESOPHAGEAL DILATION;  Surgeon: Beryle Beams, MD;  Location: [facility] ENDOSCOPY;  Service: Endoscopy;  Laterality: N/A;  Balloon Dilation  . Esophagogastroduodenoscopy N/A 08/15/2014    Procedure: ESOPHAGOGASTRODUODENOSCOPY (EGD);  Surgeon: Carol Ada, MD;  Location: [facility] ENDOSCOPY;  Service: Endoscopy;  Laterality: N/A;  . Colonoscopy N/A 08/17/2014    Procedure: COLONOSCOPY;  Surgeon: Carol Ada,  MD;  Location: [facility] ENDOSCOPY;  Service: Endoscopy;  Laterality: N/A;   No family history on file. History  Substance Use Topics  . Smoking status: Never Smoker   . Smokeless tobacco: Not on file  . Alcohol Use: No    Review of Systems  Constitutional: Negative.   Musculoskeletal: Positive for gait problem. Negative for joint swelling.  Skin: Negative.     Allergies  Review of patient's allergies indicates no known allergies.  Home Medications   Prior to Admission medications   Medication Sig Start Date End Date Taking? Authorizing Provider  acetaminophen (TYLENOL) 500 MG tablet Take 1,000 mg by mouth every 6 (six) hours as needed for mild pain or moderate pain.    Historical Provider, MD  amLODipine (NORVASC) 10 MG tablet Take 10 mg by mouth at bedtime. For blood pressure     Historical Provider, MD  atorvastatin (LIPITOR) 40 MG tablet Take 40 mg by mouth daily.     Historical Provider, MD  B Complex-C-Folic Acid (DIALYVITE TABLET) TABS Take 1 tablet by mouth daily.      Historical Provider, MD  calcium acetate (PHOSLO) 667 MG capsule Take 1,334 mg by mouth 3 (three) times daily with meals. And 1 cap with snack    Historical Provider, MD  colchicine 0.6 MG tablet Take 0.6 mg by mouth 3 (three) times a week. Monday, wednesday, friday    Historical Provider, MD  hydrALAZINE (APRESOLINE) 50 MG tablet Take 50 mg by mouth 2 (two) times daily.    Historical  Provider, MD  HYDROcodone-acetaminophen (NORCO/VICODIN) 5-325 MG per tablet Take 1 tablet by mouth every 6 (six) hours as needed. 09/03/14   Billy Fischer, MD  metoprolol (TOPROL-XL) 200 MG 24 hr tablet Take 200 mg by mouth at bedtime.     Historical Provider, MD  Nutritional Supplements (FEEDING SUPPLEMENT, NEPRO CARB STEADY,) LIQD Take 237 mLs by mouth 3 (three) times daily between meals. 08/18/14   Nita Sells, MD  omeprazole (PRILOSEC) 20 MG capsule Take 20 mg by mouth daily.      Historical Provider, MD  oxyCODONE  (ROXICODONE) 5 MG immediate release tablet Take 1 tablet (5 mg total) by mouth every 6 (six) hours as needed for severe pain. 06/04/13   Domenic Moras, PA-C  traMADol (ULTRAM) 50 MG tablet Take 1 tablet (50 mg total) by mouth every 12 (twelve) hours as needed for moderate pain or severe pain. 07/22/14   Melony Overly, MD  triamcinolone cream (KENALOG) 0.1 % Apply 1 application topically 2 (two) times daily.  05/17/13   Historical Provider, MD   BP 228/116 mmHg  Pulse 71  Temp(Src) 97.6 F (36.4 C) (Oral)  Resp 16  SpO2 100% Physical Exam  Constitutional: He is oriented to person, place, and time. He appears well-developed and well-nourished. He appears distressed.  Musculoskeletal: He exhibits tenderness.       Legs: Neurological: He is alert and oriented to person, place, and time.  Skin: Skin is warm and dry.  Nursing note and vitals reviewed.   ED Course  Procedures (including critical care time) Labs Review Labs Reviewed - No data to display  Imaging Review Dg Hip Unilat With Pelvis 2-3 Views Left  09/03/2014   CLINICAL DATA:  Left hip pain for 2 months, no known injury  EXAM: LEFT HIP (WITH PELVIS) 2-3 VIEWS  COMPARISON:  07/22/2014  FINDINGS: Three views of the left hip submitted. No acute fracture or subluxation. Again noted diffuse osteopenia. Stable mild degenerative changes with minimal superior acetabular sclerosis.  IMPRESSION: No acute fracture or subluxation. Diffuse osteopenia. Stable mild degenerative changes.   Electronically Signed   By: Lahoma Crocker M.D.   On: 09/03/2014 15:39   X-rays reviewed and report per radiologist. .    MDM   1. Arthritis of left hip   2. Pain aggravated by walking        Billy Fischer, MD 09/03/14 [phone]

## 2014-09-03 NOTE — Discharge Instructions (Signed)
Use ice on hip and pain medicine as needed, see orthopedist if further problems.

## 2014-09-03 NOTE — ED Notes (Signed)
Pt comes in chronic left hip pain radiating down to leg  States the pain is worsening with diff walking Taking Tylenol as needed without relief

## 2014-09-04 DIAGNOSIS — R51 Headache: Secondary | ICD-10-CM | POA: Diagnosis not present

## 2014-09-04 DIAGNOSIS — R52 Pain, unspecified: Secondary | ICD-10-CM | POA: Diagnosis not present

## 2014-09-04 DIAGNOSIS — S41101A Unspecified open wound of right upper arm, initial encounter: Secondary | ICD-10-CM | POA: Diagnosis not present

## 2014-09-04 DIAGNOSIS — N186 End stage renal disease: Secondary | ICD-10-CM | POA: Diagnosis not present

## 2014-09-04 DIAGNOSIS — N2581 Secondary hyperparathyroidism of renal origin: Secondary | ICD-10-CM | POA: Diagnosis not present

## 2014-09-04 DIAGNOSIS — D688 Other specified coagulation defects: Secondary | ICD-10-CM | POA: Diagnosis not present

## 2014-09-04 DIAGNOSIS — D631 Anemia in chronic kidney disease: Secondary | ICD-10-CM | POA: Diagnosis not present

## 2014-09-04 DIAGNOSIS — D509 Iron deficiency anemia, unspecified: Secondary | ICD-10-CM | POA: Diagnosis not present

## 2014-09-04 DIAGNOSIS — L03039 Cellulitis of unspecified toe: Secondary | ICD-10-CM | POA: Diagnosis not present

## 2014-09-04 DIAGNOSIS — D689 Coagulation defect, unspecified: Secondary | ICD-10-CM | POA: Diagnosis not present

## 2014-09-05 DIAGNOSIS — I120 Hypertensive chronic kidney disease with stage 5 chronic kidney disease or end stage renal disease: Secondary | ICD-10-CM | POA: Diagnosis not present

## 2014-09-05 DIAGNOSIS — N186 End stage renal disease: Secondary | ICD-10-CM | POA: Diagnosis not present

## 2014-09-05 DIAGNOSIS — Z992 Dependence on renal dialysis: Secondary | ICD-10-CM | POA: Diagnosis not present

## 2014-09-06 DIAGNOSIS — R52 Pain, unspecified: Secondary | ICD-10-CM | POA: Diagnosis not present

## 2014-09-06 DIAGNOSIS — N186 End stage renal disease: Secondary | ICD-10-CM | POA: Diagnosis not present

## 2014-09-06 DIAGNOSIS — N2581 Secondary hyperparathyroidism of renal origin: Secondary | ICD-10-CM | POA: Diagnosis not present

## 2014-09-06 DIAGNOSIS — D509 Iron deficiency anemia, unspecified: Secondary | ICD-10-CM | POA: Diagnosis not present

## 2014-09-06 DIAGNOSIS — D631 Anemia in chronic kidney disease: Secondary | ICD-10-CM | POA: Diagnosis not present

## 2014-09-08 DIAGNOSIS — N2581 Secondary hyperparathyroidism of renal origin: Secondary | ICD-10-CM | POA: Diagnosis not present

## 2014-09-08 DIAGNOSIS — D631 Anemia in chronic kidney disease: Secondary | ICD-10-CM | POA: Diagnosis not present

## 2014-09-08 DIAGNOSIS — R52 Pain, unspecified: Secondary | ICD-10-CM | POA: Diagnosis not present

## 2014-09-08 DIAGNOSIS — D509 Iron deficiency anemia, unspecified: Secondary | ICD-10-CM | POA: Diagnosis not present

## 2014-09-08 DIAGNOSIS — N186 End stage renal disease: Secondary | ICD-10-CM | POA: Diagnosis not present

## 2014-09-11 DIAGNOSIS — N2581 Secondary hyperparathyroidism of renal origin: Secondary | ICD-10-CM | POA: Diagnosis not present

## 2014-09-11 DIAGNOSIS — N186 End stage renal disease: Secondary | ICD-10-CM | POA: Diagnosis not present

## 2014-09-11 DIAGNOSIS — D509 Iron deficiency anemia, unspecified: Secondary | ICD-10-CM | POA: Diagnosis not present

## 2014-09-11 DIAGNOSIS — D631 Anemia in chronic kidney disease: Secondary | ICD-10-CM | POA: Diagnosis not present

## 2014-09-11 DIAGNOSIS — R52 Pain, unspecified: Secondary | ICD-10-CM | POA: Diagnosis not present

## 2014-09-13 DIAGNOSIS — D631 Anemia in chronic kidney disease: Secondary | ICD-10-CM | POA: Diagnosis not present

## 2014-09-13 DIAGNOSIS — N186 End stage renal disease: Secondary | ICD-10-CM | POA: Diagnosis not present

## 2014-09-13 DIAGNOSIS — R52 Pain, unspecified: Secondary | ICD-10-CM | POA: Diagnosis not present

## 2014-09-13 DIAGNOSIS — D509 Iron deficiency anemia, unspecified: Secondary | ICD-10-CM | POA: Diagnosis not present

## 2014-09-13 DIAGNOSIS — N2581 Secondary hyperparathyroidism of renal origin: Secondary | ICD-10-CM | POA: Diagnosis not present

## 2014-09-15 DIAGNOSIS — D509 Iron deficiency anemia, unspecified: Secondary | ICD-10-CM | POA: Diagnosis not present

## 2014-09-15 DIAGNOSIS — N2581 Secondary hyperparathyroidism of renal origin: Secondary | ICD-10-CM | POA: Diagnosis not present

## 2014-09-15 DIAGNOSIS — D631 Anemia in chronic kidney disease: Secondary | ICD-10-CM | POA: Diagnosis not present

## 2014-09-15 DIAGNOSIS — R52 Pain, unspecified: Secondary | ICD-10-CM | POA: Diagnosis not present

## 2014-09-15 DIAGNOSIS — N186 End stage renal disease: Secondary | ICD-10-CM | POA: Diagnosis not present

## 2014-09-18 DIAGNOSIS — N186 End stage renal disease: Secondary | ICD-10-CM | POA: Diagnosis not present

## 2014-09-18 DIAGNOSIS — R52 Pain, unspecified: Secondary | ICD-10-CM | POA: Diagnosis not present

## 2014-09-18 DIAGNOSIS — D631 Anemia in chronic kidney disease: Secondary | ICD-10-CM | POA: Diagnosis not present

## 2014-09-18 DIAGNOSIS — D509 Iron deficiency anemia, unspecified: Secondary | ICD-10-CM | POA: Diagnosis not present

## 2014-09-18 DIAGNOSIS — N2581 Secondary hyperparathyroidism of renal origin: Secondary | ICD-10-CM | POA: Diagnosis not present

## 2014-09-20 DIAGNOSIS — D509 Iron deficiency anemia, unspecified: Secondary | ICD-10-CM | POA: Diagnosis not present

## 2014-09-20 DIAGNOSIS — D631 Anemia in chronic kidney disease: Secondary | ICD-10-CM | POA: Diagnosis not present

## 2014-09-20 DIAGNOSIS — N2581 Secondary hyperparathyroidism of renal origin: Secondary | ICD-10-CM | POA: Diagnosis not present

## 2014-09-20 DIAGNOSIS — R52 Pain, unspecified: Secondary | ICD-10-CM | POA: Diagnosis not present

## 2014-09-20 DIAGNOSIS — N186 End stage renal disease: Secondary | ICD-10-CM | POA: Diagnosis not present

## 2014-09-22 DIAGNOSIS — N2581 Secondary hyperparathyroidism of renal origin: Secondary | ICD-10-CM | POA: Diagnosis not present

## 2014-09-22 DIAGNOSIS — D509 Iron deficiency anemia, unspecified: Secondary | ICD-10-CM | POA: Diagnosis not present

## 2014-09-22 DIAGNOSIS — N186 End stage renal disease: Secondary | ICD-10-CM | POA: Diagnosis not present

## 2014-09-22 DIAGNOSIS — D631 Anemia in chronic kidney disease: Secondary | ICD-10-CM | POA: Diagnosis not present

## 2014-09-22 DIAGNOSIS — R52 Pain, unspecified: Secondary | ICD-10-CM | POA: Diagnosis not present

## 2014-09-25 DIAGNOSIS — R52 Pain, unspecified: Secondary | ICD-10-CM | POA: Diagnosis not present

## 2014-09-25 DIAGNOSIS — N186 End stage renal disease: Secondary | ICD-10-CM | POA: Diagnosis not present

## 2014-09-25 DIAGNOSIS — N2581 Secondary hyperparathyroidism of renal origin: Secondary | ICD-10-CM | POA: Diagnosis not present

## 2014-09-25 DIAGNOSIS — D631 Anemia in chronic kidney disease: Secondary | ICD-10-CM | POA: Diagnosis not present

## 2014-09-25 DIAGNOSIS — D509 Iron deficiency anemia, unspecified: Secondary | ICD-10-CM | POA: Diagnosis not present

## 2014-09-27 DIAGNOSIS — R52 Pain, unspecified: Secondary | ICD-10-CM | POA: Diagnosis not present

## 2014-09-27 DIAGNOSIS — N2581 Secondary hyperparathyroidism of renal origin: Secondary | ICD-10-CM | POA: Diagnosis not present

## 2014-09-27 DIAGNOSIS — D509 Iron deficiency anemia, unspecified: Secondary | ICD-10-CM | POA: Diagnosis not present

## 2014-09-27 DIAGNOSIS — N186 End stage renal disease: Secondary | ICD-10-CM | POA: Diagnosis not present

## 2014-09-27 DIAGNOSIS — D631 Anemia in chronic kidney disease: Secondary | ICD-10-CM | POA: Diagnosis not present

## 2014-09-28 DIAGNOSIS — I120 Hypertensive chronic kidney disease with stage 5 chronic kidney disease or end stage renal disease: Secondary | ICD-10-CM | POA: Diagnosis not present

## 2014-09-28 DIAGNOSIS — Z992 Dependence on renal dialysis: Secondary | ICD-10-CM | POA: Diagnosis not present

## 2014-09-28 DIAGNOSIS — N186 End stage renal disease: Secondary | ICD-10-CM | POA: Diagnosis not present

## 2014-09-28 DIAGNOSIS — M25552 Pain in left hip: Secondary | ICD-10-CM | POA: Diagnosis not present

## 2014-09-29 DIAGNOSIS — N2581 Secondary hyperparathyroidism of renal origin: Secondary | ICD-10-CM | POA: Diagnosis not present

## 2014-09-29 DIAGNOSIS — R52 Pain, unspecified: Secondary | ICD-10-CM | POA: Diagnosis not present

## 2014-09-29 DIAGNOSIS — N186 End stage renal disease: Secondary | ICD-10-CM | POA: Diagnosis not present

## 2014-09-29 DIAGNOSIS — D509 Iron deficiency anemia, unspecified: Secondary | ICD-10-CM | POA: Diagnosis not present

## 2014-09-29 DIAGNOSIS — D631 Anemia in chronic kidney disease: Secondary | ICD-10-CM | POA: Diagnosis not present

## 2014-10-02 DIAGNOSIS — R52 Pain, unspecified: Secondary | ICD-10-CM | POA: Diagnosis not present

## 2014-10-02 DIAGNOSIS — D631 Anemia in chronic kidney disease: Secondary | ICD-10-CM | POA: Diagnosis not present

## 2014-10-02 DIAGNOSIS — N186 End stage renal disease: Secondary | ICD-10-CM | POA: Diagnosis not present

## 2014-10-02 DIAGNOSIS — D509 Iron deficiency anemia, unspecified: Secondary | ICD-10-CM | POA: Diagnosis not present

## 2014-10-02 DIAGNOSIS — N2581 Secondary hyperparathyroidism of renal origin: Secondary | ICD-10-CM | POA: Diagnosis not present

## 2014-10-04 DIAGNOSIS — D631 Anemia in chronic kidney disease: Secondary | ICD-10-CM | POA: Diagnosis not present

## 2014-10-04 DIAGNOSIS — R52 Pain, unspecified: Secondary | ICD-10-CM | POA: Diagnosis not present

## 2014-10-04 DIAGNOSIS — N186 End stage renal disease: Secondary | ICD-10-CM | POA: Diagnosis not present

## 2014-10-04 DIAGNOSIS — N2581 Secondary hyperparathyroidism of renal origin: Secondary | ICD-10-CM | POA: Diagnosis not present

## 2014-10-04 DIAGNOSIS — D509 Iron deficiency anemia, unspecified: Secondary | ICD-10-CM | POA: Diagnosis not present

## 2014-10-05 DIAGNOSIS — Z992 Dependence on renal dialysis: Secondary | ICD-10-CM | POA: Diagnosis not present

## 2014-10-05 DIAGNOSIS — N186 End stage renal disease: Secondary | ICD-10-CM | POA: Diagnosis not present

## 2014-10-05 DIAGNOSIS — I120 Hypertensive chronic kidney disease with stage 5 chronic kidney disease or end stage renal disease: Secondary | ICD-10-CM | POA: Diagnosis not present

## 2014-10-06 DIAGNOSIS — R52 Pain, unspecified: Secondary | ICD-10-CM | POA: Diagnosis not present

## 2014-10-06 DIAGNOSIS — K760 Fatty (change of) liver, not elsewhere classified: Secondary | ICD-10-CM | POA: Diagnosis not present

## 2014-10-06 DIAGNOSIS — E039 Hypothyroidism, unspecified: Secondary | ICD-10-CM | POA: Diagnosis not present

## 2014-10-06 DIAGNOSIS — D509 Iron deficiency anemia, unspecified: Secondary | ICD-10-CM | POA: Diagnosis not present

## 2014-10-06 DIAGNOSIS — R51 Headache: Secondary | ICD-10-CM | POA: Diagnosis not present

## 2014-10-06 DIAGNOSIS — N186 End stage renal disease: Secondary | ICD-10-CM | POA: Diagnosis not present

## 2014-10-09 DIAGNOSIS — R52 Pain, unspecified: Secondary | ICD-10-CM | POA: Diagnosis not present

## 2014-10-09 DIAGNOSIS — N186 End stage renal disease: Secondary | ICD-10-CM | POA: Diagnosis not present

## 2014-10-09 DIAGNOSIS — R51 Headache: Secondary | ICD-10-CM | POA: Diagnosis not present

## 2014-10-09 DIAGNOSIS — D509 Iron deficiency anemia, unspecified: Secondary | ICD-10-CM | POA: Diagnosis not present

## 2014-10-09 DIAGNOSIS — K760 Fatty (change of) liver, not elsewhere classified: Secondary | ICD-10-CM | POA: Diagnosis not present

## 2014-10-09 DIAGNOSIS — E039 Hypothyroidism, unspecified: Secondary | ICD-10-CM | POA: Diagnosis not present

## 2014-10-11 DIAGNOSIS — R52 Pain, unspecified: Secondary | ICD-10-CM | POA: Diagnosis not present

## 2014-10-11 DIAGNOSIS — N186 End stage renal disease: Secondary | ICD-10-CM | POA: Diagnosis not present

## 2014-10-11 DIAGNOSIS — R51 Headache: Secondary | ICD-10-CM | POA: Diagnosis not present

## 2014-10-11 DIAGNOSIS — E039 Hypothyroidism, unspecified: Secondary | ICD-10-CM | POA: Diagnosis not present

## 2014-10-11 DIAGNOSIS — D509 Iron deficiency anemia, unspecified: Secondary | ICD-10-CM | POA: Diagnosis not present

## 2014-10-11 DIAGNOSIS — K760 Fatty (change of) liver, not elsewhere classified: Secondary | ICD-10-CM | POA: Diagnosis not present

## 2014-10-13 DIAGNOSIS — D509 Iron deficiency anemia, unspecified: Secondary | ICD-10-CM | POA: Diagnosis not present

## 2014-10-13 DIAGNOSIS — R52 Pain, unspecified: Secondary | ICD-10-CM | POA: Diagnosis not present

## 2014-10-13 DIAGNOSIS — N186 End stage renal disease: Secondary | ICD-10-CM | POA: Diagnosis not present

## 2014-10-13 DIAGNOSIS — E039 Hypothyroidism, unspecified: Secondary | ICD-10-CM | POA: Diagnosis not present

## 2014-10-13 DIAGNOSIS — R51 Headache: Secondary | ICD-10-CM | POA: Diagnosis not present

## 2014-10-13 DIAGNOSIS — K760 Fatty (change of) liver, not elsewhere classified: Secondary | ICD-10-CM | POA: Diagnosis not present

## 2014-10-16 DIAGNOSIS — R51 Headache: Secondary | ICD-10-CM | POA: Diagnosis not present

## 2014-10-16 DIAGNOSIS — D509 Iron deficiency anemia, unspecified: Secondary | ICD-10-CM | POA: Diagnosis not present

## 2014-10-16 DIAGNOSIS — K760 Fatty (change of) liver, not elsewhere classified: Secondary | ICD-10-CM | POA: Diagnosis not present

## 2014-10-16 DIAGNOSIS — E039 Hypothyroidism, unspecified: Secondary | ICD-10-CM | POA: Diagnosis not present

## 2014-10-16 DIAGNOSIS — R52 Pain, unspecified: Secondary | ICD-10-CM | POA: Diagnosis not present

## 2014-10-16 DIAGNOSIS — N186 End stage renal disease: Secondary | ICD-10-CM | POA: Diagnosis not present

## 2014-10-18 DIAGNOSIS — R51 Headache: Secondary | ICD-10-CM | POA: Diagnosis not present

## 2014-10-18 DIAGNOSIS — D509 Iron deficiency anemia, unspecified: Secondary | ICD-10-CM | POA: Diagnosis not present

## 2014-10-18 DIAGNOSIS — N186 End stage renal disease: Secondary | ICD-10-CM | POA: Diagnosis not present

## 2014-10-18 DIAGNOSIS — E039 Hypothyroidism, unspecified: Secondary | ICD-10-CM | POA: Diagnosis not present

## 2014-10-18 DIAGNOSIS — R52 Pain, unspecified: Secondary | ICD-10-CM | POA: Diagnosis not present

## 2014-10-18 DIAGNOSIS — K760 Fatty (change of) liver, not elsewhere classified: Secondary | ICD-10-CM | POA: Diagnosis not present

## 2014-10-20 DIAGNOSIS — R52 Pain, unspecified: Secondary | ICD-10-CM | POA: Diagnosis not present

## 2014-10-20 DIAGNOSIS — R51 Headache: Secondary | ICD-10-CM | POA: Diagnosis not present

## 2014-10-20 DIAGNOSIS — K760 Fatty (change of) liver, not elsewhere classified: Secondary | ICD-10-CM | POA: Diagnosis not present

## 2014-10-20 DIAGNOSIS — E039 Hypothyroidism, unspecified: Secondary | ICD-10-CM | POA: Diagnosis not present

## 2014-10-20 DIAGNOSIS — D509 Iron deficiency anemia, unspecified: Secondary | ICD-10-CM | POA: Diagnosis not present

## 2014-10-20 DIAGNOSIS — N186 End stage renal disease: Secondary | ICD-10-CM | POA: Diagnosis not present

## 2014-10-23 DIAGNOSIS — R51 Headache: Secondary | ICD-10-CM | POA: Diagnosis not present

## 2014-10-23 DIAGNOSIS — R52 Pain, unspecified: Secondary | ICD-10-CM | POA: Diagnosis not present

## 2014-10-23 DIAGNOSIS — K760 Fatty (change of) liver, not elsewhere classified: Secondary | ICD-10-CM | POA: Diagnosis not present

## 2014-10-23 DIAGNOSIS — E039 Hypothyroidism, unspecified: Secondary | ICD-10-CM | POA: Diagnosis not present

## 2014-10-23 DIAGNOSIS — N186 End stage renal disease: Secondary | ICD-10-CM | POA: Diagnosis not present

## 2014-10-23 DIAGNOSIS — D509 Iron deficiency anemia, unspecified: Secondary | ICD-10-CM | POA: Diagnosis not present

## 2014-10-25 DIAGNOSIS — R52 Pain, unspecified: Secondary | ICD-10-CM | POA: Diagnosis not present

## 2014-10-25 DIAGNOSIS — E039 Hypothyroidism, unspecified: Secondary | ICD-10-CM | POA: Diagnosis not present

## 2014-10-25 DIAGNOSIS — R51 Headache: Secondary | ICD-10-CM | POA: Diagnosis not present

## 2014-10-25 DIAGNOSIS — D509 Iron deficiency anemia, unspecified: Secondary | ICD-10-CM | POA: Diagnosis not present

## 2014-10-25 DIAGNOSIS — K760 Fatty (change of) liver, not elsewhere classified: Secondary | ICD-10-CM | POA: Diagnosis not present

## 2014-10-25 DIAGNOSIS — N186 End stage renal disease: Secondary | ICD-10-CM | POA: Diagnosis not present

## 2014-10-27 DIAGNOSIS — R52 Pain, unspecified: Secondary | ICD-10-CM | POA: Diagnosis not present

## 2014-10-27 DIAGNOSIS — E039 Hypothyroidism, unspecified: Secondary | ICD-10-CM | POA: Diagnosis not present

## 2014-10-27 DIAGNOSIS — R51 Headache: Secondary | ICD-10-CM | POA: Diagnosis not present

## 2014-10-27 DIAGNOSIS — K760 Fatty (change of) liver, not elsewhere classified: Secondary | ICD-10-CM | POA: Diagnosis not present

## 2014-10-27 DIAGNOSIS — D509 Iron deficiency anemia, unspecified: Secondary | ICD-10-CM | POA: Diagnosis not present

## 2014-10-27 DIAGNOSIS — N186 End stage renal disease: Secondary | ICD-10-CM | POA: Diagnosis not present

## 2014-10-30 DIAGNOSIS — D509 Iron deficiency anemia, unspecified: Secondary | ICD-10-CM | POA: Diagnosis not present

## 2014-10-30 DIAGNOSIS — R51 Headache: Secondary | ICD-10-CM | POA: Diagnosis not present

## 2014-10-30 DIAGNOSIS — K760 Fatty (change of) liver, not elsewhere classified: Secondary | ICD-10-CM | POA: Diagnosis not present

## 2014-10-30 DIAGNOSIS — E039 Hypothyroidism, unspecified: Secondary | ICD-10-CM | POA: Diagnosis not present

## 2014-10-30 DIAGNOSIS — N186 End stage renal disease: Secondary | ICD-10-CM | POA: Diagnosis not present

## 2014-10-30 DIAGNOSIS — R52 Pain, unspecified: Secondary | ICD-10-CM | POA: Diagnosis not present

## 2014-11-01 DIAGNOSIS — K760 Fatty (change of) liver, not elsewhere classified: Secondary | ICD-10-CM | POA: Diagnosis not present

## 2014-11-01 DIAGNOSIS — E039 Hypothyroidism, unspecified: Secondary | ICD-10-CM | POA: Diagnosis not present

## 2014-11-01 DIAGNOSIS — R52 Pain, unspecified: Secondary | ICD-10-CM | POA: Diagnosis not present

## 2014-11-01 DIAGNOSIS — D509 Iron deficiency anemia, unspecified: Secondary | ICD-10-CM | POA: Diagnosis not present

## 2014-11-01 DIAGNOSIS — R51 Headache: Secondary | ICD-10-CM | POA: Diagnosis not present

## 2014-11-01 DIAGNOSIS — N186 End stage renal disease: Secondary | ICD-10-CM | POA: Diagnosis not present

## 2014-11-03 DIAGNOSIS — R51 Headache: Secondary | ICD-10-CM | POA: Diagnosis not present

## 2014-11-03 DIAGNOSIS — D509 Iron deficiency anemia, unspecified: Secondary | ICD-10-CM | POA: Diagnosis not present

## 2014-11-03 DIAGNOSIS — R52 Pain, unspecified: Secondary | ICD-10-CM | POA: Diagnosis not present

## 2014-11-03 DIAGNOSIS — K760 Fatty (change of) liver, not elsewhere classified: Secondary | ICD-10-CM | POA: Diagnosis not present

## 2014-11-03 DIAGNOSIS — E039 Hypothyroidism, unspecified: Secondary | ICD-10-CM | POA: Diagnosis not present

## 2014-11-03 DIAGNOSIS — N186 End stage renal disease: Secondary | ICD-10-CM | POA: Diagnosis not present

## 2014-11-05 DIAGNOSIS — Z992 Dependence on renal dialysis: Secondary | ICD-10-CM | POA: Diagnosis not present

## 2014-11-05 DIAGNOSIS — N186 End stage renal disease: Secondary | ICD-10-CM | POA: Diagnosis not present

## 2014-11-05 DIAGNOSIS — I120 Hypertensive chronic kidney disease with stage 5 chronic kidney disease or end stage renal disease: Secondary | ICD-10-CM | POA: Diagnosis not present

## 2014-11-06 DIAGNOSIS — D509 Iron deficiency anemia, unspecified: Secondary | ICD-10-CM | POA: Diagnosis not present

## 2014-11-06 DIAGNOSIS — N2581 Secondary hyperparathyroidism of renal origin: Secondary | ICD-10-CM | POA: Diagnosis not present

## 2014-11-06 DIAGNOSIS — N186 End stage renal disease: Secondary | ICD-10-CM | POA: Diagnosis not present

## 2014-11-06 DIAGNOSIS — R52 Pain, unspecified: Secondary | ICD-10-CM | POA: Diagnosis not present

## 2014-11-06 DIAGNOSIS — D631 Anemia in chronic kidney disease: Secondary | ICD-10-CM | POA: Diagnosis not present

## 2014-11-08 DIAGNOSIS — R52 Pain, unspecified: Secondary | ICD-10-CM | POA: Diagnosis not present

## 2014-11-08 DIAGNOSIS — D631 Anemia in chronic kidney disease: Secondary | ICD-10-CM | POA: Diagnosis not present

## 2014-11-08 DIAGNOSIS — D509 Iron deficiency anemia, unspecified: Secondary | ICD-10-CM | POA: Diagnosis not present

## 2014-11-08 DIAGNOSIS — N186 End stage renal disease: Secondary | ICD-10-CM | POA: Diagnosis not present

## 2014-11-08 DIAGNOSIS — N2581 Secondary hyperparathyroidism of renal origin: Secondary | ICD-10-CM | POA: Diagnosis not present

## 2014-11-10 DIAGNOSIS — D509 Iron deficiency anemia, unspecified: Secondary | ICD-10-CM | POA: Diagnosis not present

## 2014-11-10 DIAGNOSIS — N2581 Secondary hyperparathyroidism of renal origin: Secondary | ICD-10-CM | POA: Diagnosis not present

## 2014-11-10 DIAGNOSIS — R52 Pain, unspecified: Secondary | ICD-10-CM | POA: Diagnosis not present

## 2014-11-10 DIAGNOSIS — D631 Anemia in chronic kidney disease: Secondary | ICD-10-CM | POA: Diagnosis not present

## 2014-11-10 DIAGNOSIS — N186 End stage renal disease: Secondary | ICD-10-CM | POA: Diagnosis not present

## 2014-11-13 DIAGNOSIS — N186 End stage renal disease: Secondary | ICD-10-CM | POA: Diagnosis not present

## 2014-11-13 DIAGNOSIS — D509 Iron deficiency anemia, unspecified: Secondary | ICD-10-CM | POA: Diagnosis not present

## 2014-11-13 DIAGNOSIS — R52 Pain, unspecified: Secondary | ICD-10-CM | POA: Diagnosis not present

## 2014-11-13 DIAGNOSIS — D631 Anemia in chronic kidney disease: Secondary | ICD-10-CM | POA: Diagnosis not present

## 2014-11-13 DIAGNOSIS — N2581 Secondary hyperparathyroidism of renal origin: Secondary | ICD-10-CM | POA: Diagnosis not present

## 2014-11-15 DIAGNOSIS — R52 Pain, unspecified: Secondary | ICD-10-CM | POA: Diagnosis not present

## 2014-11-15 DIAGNOSIS — N2581 Secondary hyperparathyroidism of renal origin: Secondary | ICD-10-CM | POA: Diagnosis not present

## 2014-11-15 DIAGNOSIS — D509 Iron deficiency anemia, unspecified: Secondary | ICD-10-CM | POA: Diagnosis not present

## 2014-11-15 DIAGNOSIS — D631 Anemia in chronic kidney disease: Secondary | ICD-10-CM | POA: Diagnosis not present

## 2014-11-15 DIAGNOSIS — N186 End stage renal disease: Secondary | ICD-10-CM | POA: Diagnosis not present

## 2014-11-17 DIAGNOSIS — N186 End stage renal disease: Secondary | ICD-10-CM | POA: Diagnosis not present

## 2014-11-17 DIAGNOSIS — D631 Anemia in chronic kidney disease: Secondary | ICD-10-CM | POA: Diagnosis not present

## 2014-11-17 DIAGNOSIS — D509 Iron deficiency anemia, unspecified: Secondary | ICD-10-CM | POA: Diagnosis not present

## 2014-11-17 DIAGNOSIS — R52 Pain, unspecified: Secondary | ICD-10-CM | POA: Diagnosis not present

## 2014-11-17 DIAGNOSIS — N2581 Secondary hyperparathyroidism of renal origin: Secondary | ICD-10-CM | POA: Diagnosis not present

## 2014-11-20 DIAGNOSIS — D509 Iron deficiency anemia, unspecified: Secondary | ICD-10-CM | POA: Diagnosis not present

## 2014-11-20 DIAGNOSIS — N186 End stage renal disease: Secondary | ICD-10-CM | POA: Diagnosis not present

## 2014-11-20 DIAGNOSIS — N2581 Secondary hyperparathyroidism of renal origin: Secondary | ICD-10-CM | POA: Diagnosis not present

## 2014-11-20 DIAGNOSIS — R52 Pain, unspecified: Secondary | ICD-10-CM | POA: Diagnosis not present

## 2014-11-20 DIAGNOSIS — D631 Anemia in chronic kidney disease: Secondary | ICD-10-CM | POA: Diagnosis not present

## 2014-11-21 DIAGNOSIS — M25511 Pain in right shoulder: Secondary | ICD-10-CM | POA: Diagnosis not present

## 2014-11-21 DIAGNOSIS — M25552 Pain in left hip: Secondary | ICD-10-CM | POA: Diagnosis not present

## 2014-11-22 ENCOUNTER — Other Ambulatory Visit: Payer: Self-pay | Admitting: Nephrology

## 2014-11-22 DIAGNOSIS — D509 Iron deficiency anemia, unspecified: Secondary | ICD-10-CM | POA: Diagnosis not present

## 2014-11-22 DIAGNOSIS — R52 Pain, unspecified: Secondary | ICD-10-CM | POA: Diagnosis not present

## 2014-11-22 DIAGNOSIS — N2581 Secondary hyperparathyroidism of renal origin: Secondary | ICD-10-CM | POA: Diagnosis not present

## 2014-11-22 DIAGNOSIS — R55 Syncope and collapse: Secondary | ICD-10-CM

## 2014-11-22 DIAGNOSIS — D631 Anemia in chronic kidney disease: Secondary | ICD-10-CM | POA: Diagnosis not present

## 2014-11-22 DIAGNOSIS — N186 End stage renal disease: Secondary | ICD-10-CM | POA: Diagnosis not present

## 2014-11-24 ENCOUNTER — Other Ambulatory Visit: Payer: Self-pay | Admitting: Orthopaedic Surgery

## 2014-11-24 DIAGNOSIS — N186 End stage renal disease: Secondary | ICD-10-CM | POA: Diagnosis not present

## 2014-11-24 DIAGNOSIS — D509 Iron deficiency anemia, unspecified: Secondary | ICD-10-CM | POA: Diagnosis not present

## 2014-11-24 DIAGNOSIS — D631 Anemia in chronic kidney disease: Secondary | ICD-10-CM | POA: Diagnosis not present

## 2014-11-24 DIAGNOSIS — N2581 Secondary hyperparathyroidism of renal origin: Secondary | ICD-10-CM | POA: Diagnosis not present

## 2014-11-24 DIAGNOSIS — R52 Pain, unspecified: Secondary | ICD-10-CM | POA: Diagnosis not present

## 2014-11-24 DIAGNOSIS — M545 Low back pain: Secondary | ICD-10-CM

## 2014-11-25 ENCOUNTER — Encounter (HOSPITAL_COMMUNITY): Payer: Self-pay | Admitting: Emergency Medicine

## 2014-11-25 ENCOUNTER — Emergency Department (HOSPITAL_COMMUNITY)
Admission: EM | Admit: 2014-11-25 | Discharge: 2014-11-25 | Disposition: A | Discharge status: Home / Self Care | Payer: Medicare Other | Attending: Emergency Medicine | Admitting: Emergency Medicine

## 2014-11-25 DIAGNOSIS — Z862 Personal history of diseases of the blood and blood-forming organs and certain disorders involving the immune mechanism: Secondary | ICD-10-CM | POA: Diagnosis not present

## 2014-11-25 DIAGNOSIS — Z86010 Personal history of colonic polyps: Secondary | ICD-10-CM | POA: Diagnosis not present

## 2014-11-25 DIAGNOSIS — Z79899 Other long term (current) drug therapy: Secondary | ICD-10-CM | POA: Diagnosis not present

## 2014-11-25 DIAGNOSIS — I10 Essential (primary) hypertension: Secondary | ICD-10-CM | POA: Diagnosis not present

## 2014-11-25 DIAGNOSIS — E213 Hyperparathyroidism, unspecified: Secondary | ICD-10-CM | POA: Diagnosis not present

## 2014-11-25 DIAGNOSIS — E782 Mixed hyperlipidemia: Secondary | ICD-10-CM | POA: Diagnosis not present

## 2014-11-25 DIAGNOSIS — N481 Balanitis: Secondary | ICD-10-CM

## 2014-11-25 DIAGNOSIS — M109 Gout, unspecified: Secondary | ICD-10-CM | POA: Insufficient documentation

## 2014-11-25 DIAGNOSIS — N4889 Other specified disorders of penis: Secondary | ICD-10-CM | POA: Diagnosis present

## 2014-11-25 MED ORDER — MUPIROCIN 2 % EX OINT: 1.0000 "application " | TOPICAL_OINTMENT | Freq: Two times a day (BID) | CUTANEOUS | Status: AC

## 2014-11-25 MED ORDER — CLOTRIMAZOLE 1 % EX CREA: TOPICAL_CREAM | CUTANEOUS | Status: DC

## 2014-11-25 NOTE — ED Provider Notes (Signed)
CSN: [id number]     Arrival date & time 11/25/14  0908 History   First MD Initiated Contact with Patient 11/25/14 [phone]     Chief Complaint  Patient presents with  . Skin Ulcer     (Consider location/radiation/quality/duration/timing/severity/associated sxs/prior Treatment) The history is provided by the patient.  Brad [name] is a 79 y.o. male hx of HTN, HL, ESRD on HD (last HD was yesterday) here with penile pain. Patient noticed a spot on his penis for about 3 weeks. He was prescribed triamcinolone cream for the last several weeks has not improved. He states that there is some discharge from it as well. Last dialysis was yesterday and he does not urinate at baseline. Denies any fevers or chills or abdominal pain or vomiting. Patient is not circumcised.  Past Medical History  Diagnosis Date  . Hypertension   . High cholesterol   . Gout   . Anemia   . Renal disorder     End stage renal disease secondary to HTN nephrosclerosis  . Secondary hyperparathyroidism   . Hypocalcemia   . Colon polyps   . Paroxysmal supraventricular tachycardia   . Status post dilatation of esophageal stricture   . Syncope    Past Surgical History  Procedure Laterality Date  . Right arm amputation     . Revision of arteriovenous goretex graft Left 12/15/2012    Procedure: REVISION OF ARTERIOVENOUS GORETEX GRAFT using 3mm x 20cm Gortex graft;  Surgeon: Mal Misty, MD;  Location: [facility];  Service: Vascular;  Laterality: Left;  . Esophagogastroduodenoscopy N/A 01/27/2014    Procedure: ESOPHAGOGASTRODUODENOSCOPY (EGD);  Surgeon: Beryle Beams, MD;  Location: [facility];  Service: Endoscopy;  Laterality: N/A;  . Esophageal dilation N/A 01/27/2014    Procedure: ESOPHAGEAL DILATION;  Surgeon: Beryle Beams, MD;  Location: [facility];  Service: Endoscopy;  Laterality: N/A;  Balloon Dilation  . Esophagogastroduodenoscopy N/A 08/15/2014    Procedure: ESOPHAGOGASTRODUODENOSCOPY (EGD);  Surgeon: Carol Ada, MD;  Location: [facility] ENDOSCOPY;  Service: Endoscopy;  Laterality: N/A;  . Colonoscopy N/A 08/17/2014    Procedure: COLONOSCOPY;  Surgeon: Carol Ada, MD;  Location: [facility] ENDOSCOPY;  Service: Endoscopy;  Laterality: N/A;   No family history on file. Social History  Substance Use Topics  . Smoking status: Never Smoker   . Smokeless tobacco: Not on file  . Alcohol Use: No    Review of Systems  Genitourinary: Positive for penile pain.  All other systems reviewed and are negative.     Allergies  Review of patient's allergies indicates no known allergies.  Home Medications   Prior to Admission medications   Medication Sig Start Date End Date Taking? Authorizing Provider  acetaminophen (TYLENOL) 500 MG tablet Take 1,000 mg by mouth every 6 (six) hours as needed for mild pain or moderate pain.    Historical Provider, MD  amLODipine (NORVASC) 10 MG tablet Take 10 mg by mouth at bedtime. For blood pressure     Historical Provider, MD  atorvastatin (LIPITOR) 40 MG tablet Take 40 mg by mouth daily.     Historical Provider, MD  B Complex-C-Folic Acid (DIALYVITE TABLET) TABS Take 1 tablet by mouth daily.      Historical Provider, MD  calcium acetate (PHOSLO) 667 MG capsule Take 1,334 mg by mouth 3 (three) times daily with meals. And 1 cap with snack    Historical Provider, MD  colchicine 0.6 MG tablet Take 0.6 mg by mouth 3 (three) times a week. Monday,  wednesday, friday    Historical Provider, MD  hydrALAZINE (APRESOLINE) 50 MG tablet Take 50 mg by mouth 2 (two) times daily.    Historical Provider, MD  HYDROcodone-acetaminophen (NORCO/VICODIN) 5-325 MG per tablet Take 1 tablet by mouth every 6 (six) hours as needed. 09/03/14   Billy Fischer, MD  metoprolol (TOPROL-XL) 200 MG 24 hr tablet Take 200 mg by mouth at bedtime.     Historical Provider, MD  Nutritional Supplements (FEEDING SUPPLEMENT, NEPRO CARB STEADY,) LIQD Take 237 mLs by mouth 3 (three) times daily between meals. 08/18/14    Nita Sells, MD  omeprazole (PRILOSEC) 20 MG capsule Take 20 mg by mouth daily.      Historical Provider, MD  oxyCODONE (ROXICODONE) 5 MG immediate release tablet Take 1 tablet (5 mg total) by mouth every 6 (six) hours as needed for severe pain. 06/04/13   Domenic Moras, PA-C  traMADol (ULTRAM) 50 MG tablet Take 1 tablet (50 mg total) by mouth every 12 (twelve) hours as needed for moderate pain or severe pain. 07/22/14   Melony Overly, MD  triamcinolone cream (KENALOG) 0.1 % Apply 1 application topically 2 (two) times daily.  05/17/13   Historical Provider, MD   There were no vitals taken for this visit. Physical Exam  Constitutional: He is oriented to person, place, and time. He appears well-developed.  Chronically ill, NAD   HENT:  Head: Normocephalic.  Mouth/Throat: Oropharynx is clear and moist.  Eyes: Conjunctivae are normal. Pupils are equal, round, and reactive to light.  Neck: Normal range of motion.  Cardiovascular: Normal rate, regular rhythm and normal heart sounds.   Pulmonary/Chest: Effort normal and breath sounds normal. No respiratory distress. He has no wheezes. He has no rales.  Abdominal: Soft. Bowel sounds are normal. He exhibits no distension. There is no tenderness. There is no rebound.  Genitourinary:  Mild balanitis on tip of penis. Uncircumcised, no paraphimosis. Part of the foreskin came apart, there is minimal purulent discharge. No abscess. No pelvic lymphadenopathy. Testicles nontender. No rash.   Musculoskeletal: Normal range of motion.  Neurological: He is oriented to person, place, and time.  Skin: Skin is warm and dry.  Psychiatric: He has a normal mood and affect. His behavior is normal. Thought content normal.  Nursing note and vitals reviewed.   ED Course  Procedures (including critical care time) Labs Review Labs Reviewed - No data to display  Imaging Review No results found. I have personally reviewed and evaluated these images and lab results  as part of my medical decision-making.   EKG Interpretation None      MDM   Final diagnoses:  None   Brad [name] is a 79 y.o. male here with balanitis. Has been with wife for many years and she has no symptoms. No signs of STD. Likely superficial skin infection. Doesn't urinate and no signs of paraphimosis. Has been on trimacinolone cream. Will stop it as it has not been helping. Will give clotrimazole for possible fungal etiology and bacitracin for mild superficial infection.      Wandra Arthurs, MD 11/25/14 [phone]

## 2014-11-25 NOTE — Discharge Instructions (Signed)
Stop triamcinolone cream.  Use clotrimazole and mupirocin cream twice daily for 1-2 weeks.  Wash penis well daily.   See your doctor.   Return to ER if you have worse discharge, penile pain, fever.

## 2014-11-25 NOTE — ED Notes (Signed)
Sores on penis for three weeks; using steroid cream that he uses on his legs, but not improving. Denies pain. Denies discharge.

## 2014-11-27 DIAGNOSIS — N2581 Secondary hyperparathyroidism of renal origin: Secondary | ICD-10-CM | POA: Diagnosis not present

## 2014-11-27 DIAGNOSIS — D631 Anemia in chronic kidney disease: Secondary | ICD-10-CM | POA: Diagnosis not present

## 2014-11-27 DIAGNOSIS — R52 Pain, unspecified: Secondary | ICD-10-CM | POA: Diagnosis not present

## 2014-11-27 DIAGNOSIS — D509 Iron deficiency anemia, unspecified: Secondary | ICD-10-CM | POA: Diagnosis not present

## 2014-11-27 DIAGNOSIS — N186 End stage renal disease: Secondary | ICD-10-CM | POA: Diagnosis not present

## 2014-11-28 ENCOUNTER — Ambulatory Visit
Admission: RE | Admit: 2014-11-28 | Discharge: 2014-11-28 | Disposition: A | Discharge status: Home / Self Care | Payer: Medicare Other | Source: Ambulatory Visit | Attending: Nephrology | Admitting: Nephrology

## 2014-11-28 DIAGNOSIS — R55 Syncope and collapse: Secondary | ICD-10-CM

## 2014-11-28 DIAGNOSIS — I6523 Occlusion and stenosis of bilateral carotid arteries: Secondary | ICD-10-CM | POA: Diagnosis not present

## 2014-11-29 DIAGNOSIS — R52 Pain, unspecified: Secondary | ICD-10-CM | POA: Diagnosis not present

## 2014-11-29 DIAGNOSIS — N186 End stage renal disease: Secondary | ICD-10-CM | POA: Diagnosis not present

## 2014-11-29 DIAGNOSIS — N2581 Secondary hyperparathyroidism of renal origin: Secondary | ICD-10-CM | POA: Diagnosis not present

## 2014-11-29 DIAGNOSIS — D509 Iron deficiency anemia, unspecified: Secondary | ICD-10-CM | POA: Diagnosis not present

## 2014-11-29 DIAGNOSIS — D631 Anemia in chronic kidney disease: Secondary | ICD-10-CM | POA: Diagnosis not present

## 2014-12-01 DIAGNOSIS — D509 Iron deficiency anemia, unspecified: Secondary | ICD-10-CM | POA: Diagnosis not present

## 2014-12-01 DIAGNOSIS — N186 End stage renal disease: Secondary | ICD-10-CM | POA: Diagnosis not present

## 2014-12-01 DIAGNOSIS — D631 Anemia in chronic kidney disease: Secondary | ICD-10-CM | POA: Diagnosis not present

## 2014-12-01 DIAGNOSIS — R52 Pain, unspecified: Secondary | ICD-10-CM | POA: Diagnosis not present

## 2014-12-01 DIAGNOSIS — N2581 Secondary hyperparathyroidism of renal origin: Secondary | ICD-10-CM | POA: Diagnosis not present

## 2014-12-04 DIAGNOSIS — D631 Anemia in chronic kidney disease: Secondary | ICD-10-CM | POA: Diagnosis not present

## 2014-12-04 DIAGNOSIS — N186 End stage renal disease: Secondary | ICD-10-CM | POA: Diagnosis not present

## 2014-12-04 DIAGNOSIS — D509 Iron deficiency anemia, unspecified: Secondary | ICD-10-CM | POA: Diagnosis not present

## 2014-12-04 DIAGNOSIS — N2581 Secondary hyperparathyroidism of renal origin: Secondary | ICD-10-CM | POA: Diagnosis not present

## 2014-12-04 DIAGNOSIS — R52 Pain, unspecified: Secondary | ICD-10-CM | POA: Diagnosis not present

## 2014-12-05 ENCOUNTER — Encounter (HOSPITAL_COMMUNITY): Payer: Self-pay | Admitting: Emergency Medicine

## 2014-12-05 ENCOUNTER — Ambulatory Visit
Admission: RE | Admit: 2014-12-05 | Discharge: 2014-12-05 | Disposition: A | Discharge status: Home / Self Care | Payer: Medicare Other | Source: Ambulatory Visit | Attending: Orthopaedic Surgery | Admitting: Orthopaedic Surgery

## 2014-12-05 ENCOUNTER — Other Ambulatory Visit: Payer: Medicare Other

## 2014-12-05 ENCOUNTER — Inpatient Hospital Stay (HOSPITAL_COMMUNITY)
Admission: EM | Admit: 2014-12-05 | Discharge: 2014-12-11 | DRG: 477 | Disposition: A | Discharge status: Skilled Nursing Facility | Payer: Medicare Other | Attending: Internal Medicine | Admitting: Internal Medicine

## 2014-12-05 DIAGNOSIS — E780 Pure hypercholesterolemia: Secondary | ICD-10-CM | POA: Diagnosis not present

## 2014-12-05 DIAGNOSIS — R262 Difficulty in walking, not elsewhere classified: Secondary | ICD-10-CM | POA: Diagnosis not present

## 2014-12-05 DIAGNOSIS — R2681 Unsteadiness on feet: Secondary | ICD-10-CM | POA: Diagnosis not present

## 2014-12-05 DIAGNOSIS — M4640 Discitis, unspecified, site unspecified: Secondary | ICD-10-CM

## 2014-12-05 DIAGNOSIS — Z89201 Acquired absence of right upper limb, unspecified level: Secondary | ICD-10-CM | POA: Diagnosis not present

## 2014-12-05 DIAGNOSIS — M4646 Discitis, unspecified, lumbar region: Secondary | ICD-10-CM | POA: Diagnosis present

## 2014-12-05 DIAGNOSIS — Z791 Long term (current) use of non-steroidal anti-inflammatories (NSAID): Secondary | ICD-10-CM

## 2014-12-05 DIAGNOSIS — N186 End stage renal disease: Secondary | ICD-10-CM | POA: Diagnosis present

## 2014-12-05 DIAGNOSIS — R0902 Hypoxemia: Secondary | ICD-10-CM

## 2014-12-05 DIAGNOSIS — R0989 Other specified symptoms and signs involving the circulatory and respiratory systems: Secondary | ICD-10-CM | POA: Diagnosis not present

## 2014-12-05 DIAGNOSIS — J9601 Acute respiratory failure with hypoxia: Secondary | ICD-10-CM | POA: Diagnosis not present

## 2014-12-05 DIAGNOSIS — M869 Osteomyelitis, unspecified: Secondary | ICD-10-CM | POA: Diagnosis present

## 2014-12-05 DIAGNOSIS — M4806 Spinal stenosis, lumbar region: Secondary | ICD-10-CM | POA: Diagnosis not present

## 2014-12-05 DIAGNOSIS — N481 Balanitis: Secondary | ICD-10-CM | POA: Diagnosis present

## 2014-12-05 DIAGNOSIS — L8990 Pressure ulcer of unspecified site, unspecified stage: Secondary | ICD-10-CM | POA: Insufficient documentation

## 2014-12-05 DIAGNOSIS — I471 Supraventricular tachycardia: Secondary | ICD-10-CM | POA: Diagnosis not present

## 2014-12-05 DIAGNOSIS — D631 Anemia in chronic kidney disease: Secondary | ICD-10-CM | POA: Diagnosis present

## 2014-12-05 DIAGNOSIS — M4626 Osteomyelitis of vertebra, lumbar region: Secondary | ICD-10-CM | POA: Diagnosis not present

## 2014-12-05 DIAGNOSIS — M545 Low back pain: Secondary | ICD-10-CM

## 2014-12-05 DIAGNOSIS — N185 Chronic kidney disease, stage 5: Secondary | ICD-10-CM | POA: Diagnosis not present

## 2014-12-05 DIAGNOSIS — N2581 Secondary hyperparathyroidism of renal origin: Secondary | ICD-10-CM | POA: Diagnosis present

## 2014-12-05 DIAGNOSIS — N485 Ulcer of penis: Secondary | ICD-10-CM | POA: Diagnosis not present

## 2014-12-05 DIAGNOSIS — E43 Unspecified severe protein-calorie malnutrition: Secondary | ICD-10-CM | POA: Diagnosis present

## 2014-12-05 DIAGNOSIS — Z79899 Other long term (current) drug therapy: Secondary | ICD-10-CM

## 2014-12-05 DIAGNOSIS — M6281 Muscle weakness (generalized): Secondary | ICD-10-CM | POA: Diagnosis not present

## 2014-12-05 DIAGNOSIS — M109 Gout, unspecified: Secondary | ICD-10-CM | POA: Diagnosis present

## 2014-12-05 DIAGNOSIS — Z992 Dependence on renal dialysis: Secondary | ICD-10-CM

## 2014-12-05 DIAGNOSIS — I10 Essential (primary) hypertension: Secondary | ICD-10-CM | POA: Diagnosis not present

## 2014-12-05 DIAGNOSIS — I120 Hypertensive chronic kidney disease with stage 5 chronic kidney disease or end stage renal disease: Secondary | ICD-10-CM | POA: Diagnosis not present

## 2014-12-05 DIAGNOSIS — M549 Dorsalgia, unspecified: Secondary | ICD-10-CM | POA: Diagnosis not present

## 2014-12-05 DIAGNOSIS — R918 Other nonspecific abnormal finding of lung field: Secondary | ICD-10-CM | POA: Diagnosis not present

## 2014-12-05 LAB — CBC WITH DIFFERENTIAL/PLATELET
BASOS PCT: 1 % (ref 0–1)
Basophils Absolute: 0 10*3/uL (ref 0.0–0.1)
Eosinophils Absolute: 0.6 10*3/uL (ref 0.0–0.7)
Eosinophils Relative: 9 % — ABNORMAL HIGH (ref 0–5)
HCT: 28.8 % — ABNORMAL LOW (ref 39.0–52.0)
HEMOGLOBIN: 9.7 g/dL — AB (ref 13.0–17.0)
Lymphocytes Relative: 14 % (ref 12–46)
Lymphs Abs: 1 10*3/uL (ref 0.7–4.0)
MCH: 26.7 pg (ref 26.0–34.0)
MCHC: 33.7 g/dL (ref 30.0–36.0)
MCV: 79.3 fL (ref 78.0–100.0)
Monocytes Absolute: 0.9 10*3/uL (ref 0.1–1.0)
Monocytes Relative: 13 % — ABNORMAL HIGH (ref 3–12)
NEUTROS ABS: 4.6 10*3/uL (ref 1.7–7.7)
NEUTROS PCT: 63 % (ref 43–77)
Platelets: 227 10*3/uL (ref 150–400)
RBC: 3.63 MIL/uL — ABNORMAL LOW (ref 4.22–5.81)
RDW: 19.8 % — ABNORMAL HIGH (ref 11.5–15.5)
WBC: 7.2 10*3/uL (ref 4.0–10.5)

## 2014-12-05 LAB — COMPREHENSIVE METABOLIC PANEL
ALBUMIN: 2.9 g/dL — AB (ref 3.5–5.0)
ALT: 18 U/L (ref 17–63)
ANION GAP: 16 — AB (ref 5–15)
AST: 31 U/L (ref 15–41)
Alkaline Phosphatase: 93 U/L (ref 38–126)
BUN: 27 mg/dL — ABNORMAL HIGH (ref 6–20)
CALCIUM: 9 mg/dL (ref 8.9–10.3)
CHLORIDE: 92 mmol/L — AB (ref 101–111)
CO2: 27 mmol/L (ref 22–32)
Creatinine, Ser: 6.52 mg/dL — ABNORMAL HIGH (ref 0.61–1.24)
GFR calc non Af Amer: 7 mL/min — ABNORMAL LOW (ref >60)
GFR, EST AFRICAN AMERICAN: 9 mL/min — AB (ref >60)
GLUCOSE: 112 mg/dL — AB (ref 65–99)
Potassium: 3.9 mmol/L (ref 3.5–5.1)
SODIUM: 135 mmol/L (ref 135–145)
Total Bilirubin: 1.6 mg/dL — ABNORMAL HIGH (ref 0.3–1.2)
Total Protein: 6.9 g/dL (ref 6.5–8.1)

## 2014-12-05 LAB — I-STAT CG4 LACTIC ACID, ED: Lactic Acid, Venous: 1.01 mmol/L (ref 0.5–2.0)

## 2014-12-05 LAB — C-REACTIVE PROTEIN: CRP: 19.9 mg/dL — AB (ref <1.0)

## 2014-12-05 LAB — SEDIMENTATION RATE: SED RATE: 87 mm/h — AB (ref 0–16)

## 2014-12-05 NOTE — ED Notes (Signed)
Pt sts told to come after having MRI and has infection per family; pt sts right hip pain and pain in lower back; pt dialysis pt

## 2014-12-05 NOTE — ED Provider Notes (Signed)
CSN: [id number]     Arrival date & time 12/05/14  1707 History   First MD Initiated Contact with Patient 12/05/14 1854     Chief Complaint  Patient presents with  . Back Pain     (Consider location/radiation/quality/duration/timing/severity/associated sxs/prior Treatment) HPI Patient with a history of back pain now presents with worsening pain, after outpatient study demonstrates likely osteomyelitis or discitis. Patient is a history of lumbar spine surgery in the distant past, though no recent intervention. Patient denies new fever, chills, nausea, vomiting, confusion. With worsening back pain over the past few days, worsening left hip pain concurrently, the patient saw his orthopedist, had outpatient MRI.   I discussed the MRI results with our referring orthopedist, prior to the patient's arrival here.   Past Medical History  Diagnosis Date  . Hypertension   . High cholesterol   . Gout   . Anemia   . Renal disorder     End stage renal disease secondary to HTN nephrosclerosis  . Secondary hyperparathyroidism   . Hypocalcemia   . Colon polyps   . Paroxysmal supraventricular tachycardia   . Status post dilatation of esophageal stricture   . Syncope    Past Surgical History  Procedure Laterality Date  . Right arm amputation     . Revision of arteriovenous goretex graft Left 12/15/2012    Procedure: REVISION OF ARTERIOVENOUS GORETEX GRAFT using 38mm x 20cm Gortex graft;  Surgeon: Mal Misty, MD;  Location: [facility];  Service: Vascular;  Laterality: Left;  . Esophagogastroduodenoscopy N/A 01/27/2014    Procedure: ESOPHAGOGASTRODUODENOSCOPY (EGD);  Surgeon: Beryle Beams, MD;  Location: [facility];  Service: Endoscopy;  Laterality: N/A;  . Esophageal dilation N/A 01/27/2014    Procedure: ESOPHAGEAL DILATION;  Surgeon: Beryle Beams, MD;  Location: [facility];  Service: Endoscopy;  Laterality: N/A;  Balloon Dilation  . Esophagogastroduodenoscopy N/A 08/15/2014    Procedure:  ESOPHAGOGASTRODUODENOSCOPY (EGD);  Surgeon: Carol Ada, MD;  Location: [facility] ENDOSCOPY;  Service: Endoscopy;  Laterality: N/A;  . Colonoscopy N/A 08/17/2014    Procedure: COLONOSCOPY;  Surgeon: Carol Ada, MD;  Location: [facility] ENDOSCOPY;  Service: Endoscopy;  Laterality: N/A;   History reviewed. No pertinent family history. Social History  Substance Use Topics  . Smoking status: Never Smoker   . Smokeless tobacco: None  . Alcohol Use: No    Review of Systems  Constitutional:       Per HPI, otherwise negative  HENT:       Per HPI, otherwise negative  Respiratory:       Per HPI, otherwise negative  Cardiovascular:       Per HPI, otherwise negative  Gastrointestinal: Negative for vomiting.  Endocrine:       Negative aside from HPI  Genitourinary: Positive for penile pain.       Patient has 2 small areas of ulceration on the genitals, was recently started on both mupirocin and anti-fungal agent, with no relief  Musculoskeletal:       Per HPI, otherwise negative  Skin: Negative.   Neurological: Negative for syncope and weakness.      Allergies  Review of patient's allergies indicates no known allergies.  Home Medications   Prior to Admission medications   Medication Sig Start Date End Date Taking? Authorizing Provider  acetaminophen (TYLENOL) 500 MG tablet Take 1,000 mg by mouth every 6 (six) hours as needed for mild pain or moderate pain.   Yes Historical Provider, MD  amLODipine (NORVASC) 10 MG tablet  Take 10 mg by mouth at bedtime. For blood pressure    Yes Historical Provider, MD  atorvastatin (LIPITOR) 40 MG tablet Take 40 mg by mouth daily.    Yes Historical Provider, MD  B Complex-C-Folic Acid (DIALYVITE TABLET) TABS Take 1 tablet by mouth daily.     Yes Historical Provider, MD  calcium acetate (PHOSLO) 667 MG capsule Take 1,334 mg by mouth 3 (three) times daily with meals. And 1 cap with snack   Yes Historical Provider, MD  clotrimazole (LOTRIMIN) 1 % cream Apply to  affected area 2 times daily 11/25/14  Yes Wandra Arthurs, MD  colchicine 0.6 MG tablet Take 0.6 mg by mouth 3 (three) times a week. Monday, wednesday, friday   Yes Historical Provider, MD  hydrALAZINE (APRESOLINE) 50 MG tablet Take 50 mg by mouth 2 (two) times daily.   Yes Historical Provider, MD  losartan (COZAAR) 25 MG tablet Take 25 mg by mouth daily. 11/13/14  Yes Historical Provider, MD  metoprolol (TOPROL-XL) 200 MG 24 hr tablet Take 200 mg by mouth at bedtime.    Yes Historical Provider, MD  mirtazapine (REMERON) 7.5 MG tablet Take 7.5 mg by mouth daily. 11/22/14  Yes Historical Provider, MD  mupirocin ointment (BACTROBAN) 2 % Place 1 application into the nose 2 (two) times daily. 11/25/14  Yes Wandra Arthurs, MD  omeprazole (PRILOSEC) 20 MG capsule Take 20 mg by mouth daily.     Yes Historical Provider, MD  SENSIPAR 60 MG tablet Take 60 mg by mouth daily. 11/07/14  Yes Historical Provider, MD  traMADol (ULTRAM) 50 MG tablet Take 50 mg by mouth every 6 (six) hours as needed for moderate pain.  11/21/14  Yes Historical Provider, MD  triamcinolone cream (KENALOG) 0.1 % Apply 1 application topically 2 (two) times daily.  11/23/14  Yes Historical Provider, MD   BP 164/81 mmHg  Pulse 62  Temp(Src) 97.7 F (36.5 C) (Oral)  Resp 16  SpO2 95% Physical Exam  Constitutional: He is oriented to person, place, and time. He appears well-developed. No distress.  HENT:  Head: Normocephalic and atraumatic.  Eyes: Conjunctivae and EOM are normal.  Cardiovascular: Normal rate and regular rhythm.   Pulmonary/Chest: Effort normal. No stridor. No respiratory distress.  Abdominal: He exhibits no distension.  Genitourinary:     Musculoskeletal: He exhibits no edema.  Neurological: He is alert and oriented to person, place, and time.  Skin: Skin is warm and dry.  Psychiatric: He has a normal mood and affect.  Nursing note and vitals reviewed.   ED Course  Procedures (including critical care time) Labs  Review Labs Reviewed  COMPREHENSIVE METABOLIC PANEL - Abnormal; Notable for the following:    Chloride 92 (*)    Glucose, Bld 112 (*)    BUN 27 (*)    Creatinine, Ser 6.52 (*)    Albumin 2.9 (*)    Total Bilirubin 1.6 (*)    GFR calc non Af Amer 7 (*)    GFR calc Af Amer 9 (*)    Anion gap 16 (*)    All other components within normal limits  CBC WITH DIFFERENTIAL/PLATELET - Abnormal; Notable for the following:    RBC 3.63 (*)    Hemoglobin 9.7 (*)    HCT 28.8 (*)    RDW 19.8 (*)    Monocytes Relative 13 (*)    Eosinophils Relative 9 (*)    All other components within normal limits  C-REACTIVE PROTEIN - Abnormal; Notable  for the following:    CRP 19.9 (*)    All other components within normal limits  SEDIMENTATION RATE - Abnormal; Notable for the following:    Sed Rate 87 (*)    All other components within normal limits  I-STAT CG4 LACTIC ACID, ED    Imaging Review Mr Lumbar Spine Wo Contrast  12/05/2014   CLINICAL DATA:  Low back pain with left hip pain, worsening for 3 months. Chronic renal failure on dialysis. History of gout.  EXAM: MRI LUMBAR SPINE WITHOUT CONTRAST  TECHNIQUE: Multiplanar, multisequence MR imaging of the lumbar spine was performed. No intravenous contrast was administered.  COMPARISON:  Right hip MRI 06/08/2013. Right hip radiographs 06/04/2013. No prior lumbar spine imaging.  FINDINGS: There is straightening of the normal lumbar lordosis. There is no significant listhesis. Disc desiccation is present in the visualized lower thoracic spine and from L1-2 to L3-4. Multiple Schmorl's nodes are noted, including a prominent 1 involving the L3 superior endplate. There is no evidence of compression fracture.  There is marked disc space height loss at L4-5 with abnormal STIR hyperintensity in the disc space and erosive changes of the endplates. Prominent marrow edema is present throughout the L4 and L5 vertebral bodies with some extension into the pedicles. Marrow edema  was present subjacent to the L5 superior endplate on the prior hip MRI but has progressed in the interim. Disc space collapse has progressed since the prior hip radiographs. There is circumferential displacement of low T2 signal material from the disc space. No paraspinal fluid collection or epidural abscess is seen.  The conus medullaris is normal in signal and terminates at T12. The kidneys are atrophic and contain numerous cysts, incompletely evaluated.  T12-L1:  Minimal disc bulging without stenosis.  L1-2:  Minimal disc bulging without stenosis.  L2-3:  Mild disc bulging without stenosis.  L3-4: Mild-to-moderate circumferential disc bulging and moderate facet hypertrophy result in mild spinal stenosis, moderate bilateral lateral recess stenosis, and mild bilateral neural foraminal stenosis.  L4-5: Circumferentially displaced material from the disc space, centrally extruded disc material behind the L4 vertebral body, and severe facet hypertrophy result in mild spinal stenosis, severe bilateral lateral recess stenosis, and severe bilateral neural foraminal stenosis. A small right facet joint effusion is noted.  L5-S1: Mild disc bulging and facet hypertrophy result in mild bilateral lateral recess narrowing without spinal canal or neural foraminal stenosis.  IMPRESSION: 1. Destructive process centered at the L4-5 disc space which has progressed from hip imaging performed in 06/2013 and is concerning for infectious discitis/osteomyelitis. Displaced disc material contributes to mild spinal stenosis and severe bilateral neural foraminal and lateral recess stenosis. No evidence of epidural or paravertebral abscess. Other potential etiologies, such as hemodialysis spondyloarthropathy and gout, are possible but not favored. 2. Mild-to-moderate disc and facet degeneration elsewhere as above. These results will be called to the ordering clinician or representative by the Radiologist Assistant, and communication documented  in the PACS or zVision Dashboard.   Electronically Signed   By: Logan Bores M.D.   On: 12/05/2014 13:27   I have personally reviewed and evaluated these images and lab results as part of my medical decision-making.  9:47 PM Patient in no distress.   MDM  Patient presents with worsening back pain, and MRI evidence for discitis osteomyelitis. Patient is awake and alert, hemodynamically stable. The absence of distress, obvious neurologic deficits, the patient will be admitted with planned biopsy tomorrow. Cultures were sent. Antibiotics not indicated pending biopsy procedure  in the morning.   Carmin Muskrat, MD 12/06/14 [phone]

## 2014-12-06 ENCOUNTER — Inpatient Hospital Stay (HOSPITAL_COMMUNITY): Payer: Medicare Other

## 2014-12-06 DIAGNOSIS — Z992 Dependence on renal dialysis: Secondary | ICD-10-CM | POA: Diagnosis not present

## 2014-12-06 DIAGNOSIS — N186 End stage renal disease: Secondary | ICD-10-CM | POA: Diagnosis not present

## 2014-12-06 DIAGNOSIS — M4640 Discitis, unspecified, site unspecified: Secondary | ICD-10-CM | POA: Diagnosis present

## 2014-12-06 DIAGNOSIS — I120 Hypertensive chronic kidney disease with stage 5 chronic kidney disease or end stage renal disease: Secondary | ICD-10-CM | POA: Diagnosis not present

## 2014-12-06 DIAGNOSIS — M869 Osteomyelitis, unspecified: Secondary | ICD-10-CM

## 2014-12-06 LAB — CBC
HEMATOCRIT: 28.5 % — AB (ref 39.0–52.0)
Hemoglobin: 9.5 g/dL — ABNORMAL LOW (ref 13.0–17.0)
MCH: 26.3 pg (ref 26.0–34.0)
MCHC: 33.3 g/dL (ref 30.0–36.0)
MCV: 78.9 fL (ref 78.0–100.0)
PLATELETS: 209 10*3/uL (ref 150–400)
RBC: 3.61 MIL/uL — AB (ref 4.22–5.81)
RDW: 20 % — ABNORMAL HIGH (ref 11.5–15.5)
WBC: 5.9 10*3/uL (ref 4.0–10.5)

## 2014-12-06 LAB — COMPREHENSIVE METABOLIC PANEL
ALT: 16 U/L — AB (ref 17–63)
AST: 25 U/L (ref 15–41)
Albumin: 2.6 g/dL — ABNORMAL LOW (ref 3.5–5.0)
Alkaline Phosphatase: 87 U/L (ref 38–126)
Anion gap: 17 — ABNORMAL HIGH (ref 5–15)
BUN: 30 mg/dL — AB (ref 6–20)
CHLORIDE: 93 mmol/L — AB (ref 101–111)
CO2: 26 mmol/L (ref 22–32)
CREATININE: 7.39 mg/dL — AB (ref 0.61–1.24)
Calcium: 8.8 mg/dL — ABNORMAL LOW (ref 8.9–10.3)
GFR calc non Af Amer: 6 mL/min — ABNORMAL LOW (ref >60)
GFR, EST AFRICAN AMERICAN: 7 mL/min — AB (ref >60)
Glucose, Bld: 81 mg/dL (ref 65–99)
POTASSIUM: 4.2 mmol/L (ref 3.5–5.1)
SODIUM: 136 mmol/L (ref 135–145)
Total Bilirubin: 1.4 mg/dL — ABNORMAL HIGH (ref 0.3–1.2)
Total Protein: 7.1 g/dL (ref 6.5–8.1)

## 2014-12-06 LAB — PROTIME-INR
INR: 1.28 (ref 0.00–1.49)
Prothrombin Time: 16.2 seconds — ABNORMAL HIGH (ref 11.6–15.2)

## 2014-12-06 LAB — MRSA PCR SCREENING: MRSA BY PCR: NEGATIVE

## 2014-12-06 MED ORDER — ONDANSETRON HCL 4 MG PO TABS
4.0000 mg | ORAL_TABLET | Freq: Four times a day (QID) | ORAL | Status: DC | PRN
  Administered 2014-12-10: 4 mg via ORAL
  Filled 2014-12-06: qty 1

## 2014-12-06 MED ORDER — ATORVASTATIN CALCIUM 40 MG PO TABS
40.0000 mg | ORAL_TABLET | Freq: Every day | ORAL | Status: DC
  Administered 2014-12-06 – 2014-12-11 (×6): 40 mg via ORAL
  Filled 2014-12-06 (×6): qty 1

## 2014-12-06 MED ORDER — CINACALCET HCL 30 MG PO TABS
60.0000 mg | ORAL_TABLET | Freq: Every day | ORAL | Status: DC
  Administered 2014-12-07 – 2014-12-11 (×5): 60 mg via ORAL
  Filled 2014-12-06 (×5): qty 2

## 2014-12-06 MED ORDER — TRAMADOL HCL 50 MG PO TABS
50.0000 mg | ORAL_TABLET | Freq: Four times a day (QID) | ORAL | Status: DC | PRN
  Administered 2014-12-06 – 2014-12-07 (×3): 50 mg via ORAL
  Filled 2014-12-06 (×3): qty 1

## 2014-12-06 MED ORDER — OXYCODONE HCL 5 MG PO TABS
5.0000 mg | ORAL_TABLET | Freq: Four times a day (QID) | ORAL | Status: DC | PRN
  Administered 2014-12-06 – 2014-12-11 (×10): 5 mg via ORAL
  Filled 2014-12-06 (×10): qty 1

## 2014-12-06 MED ORDER — AMLODIPINE BESYLATE 10 MG PO TABS
10.0000 mg | ORAL_TABLET | Freq: Every day | ORAL | Status: DC
  Administered 2014-12-06 – 2014-12-07 (×3): 10 mg via ORAL
  Filled 2014-12-06 (×3): qty 1

## 2014-12-06 MED ORDER — DEXTROSE 5 % IV SOLN
2.0000 g | Freq: Once | INTRAVENOUS | Status: AC
  Administered 2014-12-06: 2 g via INTRAVENOUS
  Filled 2014-12-06: qty 2

## 2014-12-06 MED ORDER — VANCOMYCIN HCL 500 MG IV SOLR
500.0000 mg | INTRAVENOUS | Status: AC
  Administered 2014-12-07: 500 mg via INTRAVENOUS
  Filled 2014-12-06: qty 500

## 2014-12-06 MED ORDER — COLCHICINE 0.6 MG PO TABS
0.6000 mg | ORAL_TABLET | ORAL | Status: DC
  Administered 2014-12-06: 0.6 mg via ORAL
  Filled 2014-12-06: qty 1

## 2014-12-06 MED ORDER — SODIUM CHLORIDE 0.9 % IJ SOLN
3.0000 mL | Freq: Two times a day (BID) | INTRAMUSCULAR | Status: DC
  Administered 2014-12-06 – 2014-12-11 (×12): 3 mL via INTRAVENOUS

## 2014-12-06 MED ORDER — NEPRO/CARBSTEADY PO LIQD
237.0000 mL | Freq: Two times a day (BID) | ORAL | Status: DC
  Administered 2014-12-07 – 2014-12-11 (×7): 237 mL via ORAL

## 2014-12-06 MED ORDER — HEPARIN SODIUM (PORCINE) 5000 UNIT/ML IJ SOLN: 5000.0000 [IU] | Freq: Three times a day (TID) | INTRAMUSCULAR | Status: DC

## 2014-12-06 MED ORDER — METOPROLOL SUCCINATE ER 100 MG PO TB24
200.0000 mg | ORAL_TABLET | Freq: Every day | ORAL | Status: DC
  Administered 2014-12-06 – 2014-12-07 (×3): 200 mg via ORAL
  Filled 2014-12-06 (×3): qty 2

## 2014-12-06 MED ORDER — FENTANYL CITRATE (PF) 100 MCG/2ML IJ SOLN
INTRAMUSCULAR | Status: AC | PRN
  Administered 2014-12-06 (×2): 25 ug via INTRAVENOUS

## 2014-12-06 MED ORDER — MIRTAZAPINE 7.5 MG PO TABS
7.5000 mg | ORAL_TABLET | Freq: Every day | ORAL | Status: DC
  Administered 2014-12-07 – 2014-12-11 (×5): 7.5 mg via ORAL
  Filled 2014-12-06 (×5): qty 1

## 2014-12-06 MED ORDER — CEFTAZIDIME 2 G IJ SOLR
2.0000 g | INTRAMUSCULAR | Status: DC
  Filled 2014-12-06: qty 2

## 2014-12-06 MED ORDER — HYDROCORTISONE 0.5 % EX CREA
TOPICAL_CREAM | Freq: Two times a day (BID) | CUTANEOUS | Status: DC
  Administered 2014-12-06 – 2014-12-11 (×10): via TOPICAL
  Filled 2014-12-06: qty 28.35

## 2014-12-06 MED ORDER — HYDRALAZINE HCL 50 MG PO TABS
50.0000 mg | ORAL_TABLET | Freq: Two times a day (BID) | ORAL | Status: DC
  Administered 2014-12-06 – 2014-12-11 (×10): 50 mg via ORAL
  Filled 2014-12-06 (×10): qty 1

## 2014-12-06 MED ORDER — PANTOPRAZOLE SODIUM 40 MG PO TBEC
40.0000 mg | DELAYED_RELEASE_TABLET | Freq: Every day | ORAL | Status: DC
  Administered 2014-12-07 – 2014-12-11 (×5): 40 mg via ORAL
  Filled 2014-12-06 (×5): qty 1

## 2014-12-06 MED ORDER — HEPARIN SODIUM (PORCINE) 5000 UNIT/ML IJ SOLN
5000.0000 [IU] | Freq: Three times a day (TID) | INTRAMUSCULAR | Status: DC
  Administered 2014-12-07 – 2014-12-11 (×12): 5000 [IU] via SUBCUTANEOUS
  Filled 2014-12-06 (×11): qty 1

## 2014-12-06 MED ORDER — ONDANSETRON HCL 4 MG/2ML IJ SOLN: 4.0000 mg | Freq: Four times a day (QID) | INTRAMUSCULAR | Status: DC | PRN

## 2014-12-06 MED ORDER — MIDAZOLAM HCL 2 MG/2ML IJ SOLN
INTRAMUSCULAR | Status: AC
  Filled 2014-12-06: qty 4

## 2014-12-06 MED ORDER — MIDAZOLAM HCL 2 MG/2ML IJ SOLN
INTRAMUSCULAR | Status: AC | PRN
  Administered 2014-12-06 (×2): 1 mg via INTRAVENOUS

## 2014-12-06 MED ORDER — DARBEPOETIN ALFA 60 MCG/0.3ML IJ SOSY
60.0000 ug | PREFILLED_SYRINGE | INTRAMUSCULAR | Status: DC
  Filled 2014-12-06: qty 0.3

## 2014-12-06 MED ORDER — ACETAMINOPHEN 650 MG RE SUPP: 650.0000 mg | Freq: Four times a day (QID) | RECTAL | Status: DC | PRN

## 2014-12-06 MED ORDER — LOSARTAN POTASSIUM 25 MG PO TABS
25.0000 mg | ORAL_TABLET | Freq: Every day | ORAL | Status: DC
  Administered 2014-12-07: 25 mg via ORAL
  Filled 2014-12-06: qty 1

## 2014-12-06 MED ORDER — VANCOMYCIN HCL 10 G IV SOLR
1250.0000 mg | Freq: Once | INTRAVENOUS | Status: AC
  Administered 2014-12-07: 1250 mg via INTRAVENOUS
  Filled 2014-12-06: qty 1250

## 2014-12-06 MED ORDER — CALCIUM ACETATE (PHOS BINDER) 667 MG PO CAPS
1334.0000 mg | ORAL_CAPSULE | Freq: Three times a day (TID) | ORAL | Status: DC
  Administered 2014-12-07 – 2014-12-11 (×11): 1334 mg via ORAL
  Filled 2014-12-06 (×12): qty 2

## 2014-12-06 MED ORDER — FENTANYL CITRATE (PF) 100 MCG/2ML IJ SOLN
INTRAMUSCULAR | Status: AC
  Filled 2014-12-06: qty 4

## 2014-12-06 MED ORDER — SODIUM CHLORIDE 0.9 % IV SOLN: INTRAVENOUS | Status: AC

## 2014-12-06 MED ORDER — ACETAMINOPHEN 325 MG PO TABS: 650.0000 mg | ORAL_TABLET | Freq: Four times a day (QID) | ORAL | Status: DC | PRN

## 2014-12-06 MED ORDER — BUPIVACAINE HCL (PF) 0.25 % IJ SOLN
INTRAMUSCULAR | Status: AC
  Filled 2014-12-06: qty 30

## 2014-12-06 NOTE — Progress Notes (Signed)
New Admission Note:  Arrival Method: Via stretcher Mental Orientation: Alert&orientedx4 Telemetry: box 08 Assessment: Completed Skin: Scabs on legs, penile wound/lesion, excoriation (peeled skin) on right coccyx/buttocks foam dressing applied IV: right foot Pain: See MAR Tubes: n/a Safety Measures: Safety Fall Prevention Plan discussed Admission: initiated [place] Orientation: Patient has been orientated to the room, unit and the staff. Family: none at bedside  Orders have been reviewed and implemented. Will continue to monitor the patient. Call light has been placed within reach and bed alarm has been activated.   Leandro Reasoner BSN, RN  Phone Number: [phone] [facility]

## 2014-12-06 NOTE — Procedures (Signed)
S/P fluoro guided L4-5 disc aspiration.

## 2014-12-06 NOTE — Consult Note (Signed)
[facility] Renal Consultation Note    Indication for Consultation:  Management of ESRD/hemodialysis; anemia, hypertension/volume and secondary hyperparathyroidism Consult requested by Dr. Berle Mull  HPI: Brad [name] is a 79 y.o. male with ESRD on MWF HD at [facility] with HTN,, hx esoph stricture, s/p right arm amputation, hx lower extremity ulcers presented with back pain. He had an MRI ordered by ortho as an outpt which  showed possible osteo/diskitis L4-5. He was therefore sent for admission with further evaluation and treatment.  His BP have been running high at his outpt HD with consideration of lowering EDW this week if hypertension persisted. Weight gains are modest.   Had carotid doppler 8/23 with < 50% stenosis bilaterally. He denies fever, chills, N, V, CP or SOB. He has been using a cream on the sore on his penis (seen in the ED several weeks ago for this). Had diarrhea x 1 yesterday. He is anuric.  Appetite has been good. Back pain has been going on for about four months. He is due for HD today   Past Medical History  Diagnosis Date  . Hypertension   . High cholesterol   . Gout   . Anemia   . Renal disorder     End stage renal disease secondary to HTN nephrosclerosis  . Secondary hyperparathyroidism   . Hypocalcemia   . Colon polyps   . Paroxysmal supraventricular tachycardia   . Status post dilatation of esophageal stricture   . Syncope    Past Surgical History  Procedure Laterality Date  . Right arm amputation     . Revision of arteriovenous goretex graft Left 12/15/2012    Procedure: REVISION OF ARTERIOVENOUS GORETEX GRAFT using 27mm x 20cm Gortex graft;  Surgeon: Mal Misty, MD;  Location: [place];  Service: Vascular;  Laterality: Left;  . Esophagogastroduodenoscopy N/A 01/27/2014    Procedure: ESOPHAGOGASTRODUODENOSCOPY (EGD);  Surgeon: Beryle Beams, MD;  Location: [facility];  Service: Endoscopy;  Laterality: N/A;  . Esophageal dilation N/A  01/27/2014    Procedure: ESOPHAGEAL DILATION;  Surgeon: Beryle Beams, MD;  Location: [facility];  Service: Endoscopy;  Laterality: N/A;  Balloon Dilation  . Esophagogastroduodenoscopy N/A 08/15/2014    Procedure: ESOPHAGOGASTRODUODENOSCOPY (EGD);  Surgeon: Carol Ada, MD;  Location: [facility] ENDOSCOPY;  Service: Endoscopy;  Laterality: N/A;  . Colonoscopy N/A 08/17/2014    Procedure: COLONOSCOPY;  Surgeon: Carol Ada, MD;  Location: [facility] ENDOSCOPY;  Service: Endoscopy;  Laterality: N/A;   History reviewed. No pertinent family history. Social History:  reports that he has never smoked. He does not have any smokeless tobacco history on file. He reports that he does not drink alcohol or use illicit drugs. No Known Allergies Prior to Admission medications   Medication Sig Start Date End Date Taking? Authorizing Provider  acetaminophen (TYLENOL) 500 MG tablet Take 1,000 mg by mouth every 6 (six) hours as needed for mild pain or moderate pain.   Yes Historical Provider, MD  amLODipine (NORVASC) 10 MG tablet Take 10 mg by mouth at bedtime. For blood pressure    Yes Historical Provider, MD  atorvastatin (LIPITOR) 40 MG tablet Take 40 mg by mouth daily.    Yes Historical Provider, MD  B Complex-C-Folic Acid (DIALYVITE TABLET) TABS Take 1 tablet by mouth daily.     Yes Historical Provider, MD  calcium acetate (PHOSLO) 667 MG capsule Take 1,334 mg by mouth 3 (three) times daily with meals. And 1 cap with snack   Yes Historical  Provider, MD  clotrimazole (LOTRIMIN) 1 % cream Apply to affected area 2 times daily 11/25/14  Yes Wandra Arthurs, MD  colchicine 0.6 MG tablet Take 0.6 mg by mouth 3 (three) times a week. Monday, wednesday, friday   Yes Historical Provider, MD  hydrALAZINE (APRESOLINE) 50 MG tablet Take 50 mg by mouth 2 (two) times daily.   Yes Historical Provider, MD  losartan (COZAAR) 25 MG tablet Take 25 mg by mouth daily. 11/13/14  Yes Historical Provider, MD  metoprolol (TOPROL-XL) 200 MG 24 hr tablet  Take 200 mg by mouth at bedtime.    Yes Historical Provider, MD  mirtazapine (REMERON) 7.5 MG tablet Take 7.5 mg by mouth daily. 11/22/14  Yes Historical Provider, MD  mupirocin ointment (BACTROBAN) 2 % Place 1 application into the nose 2 (two) times daily. 11/25/14  Yes Wandra Arthurs, MD  omeprazole (PRILOSEC) 20 MG capsule Take 20 mg by mouth daily.     Yes Historical Provider, MD  SENSIPAR 60 MG tablet Take 60 mg by mouth daily. 11/07/14  Yes Historical Provider, MD  traMADol (ULTRAM) 50 MG tablet Take 50 mg by mouth every 6 (six) hours as needed for moderate pain.  11/21/14  Yes Historical Provider, MD  triamcinolone cream (KENALOG) 0.1 % Apply 1 application topically 2 (two) times daily.  11/23/14  Yes Historical Provider, MD   Current Facility-Administered Medications  Medication Dose Route Frequency Provider Last Rate Last Dose  . acetaminophen (TYLENOL) tablet 650 mg  650 mg Oral Q6H PRN Lavina Hamman, MD       Or  . acetaminophen (TYLENOL) suppository 650 mg  650 mg Rectal Q6H PRN Lavina Hamman, MD      . amLODipine (NORVASC) tablet 10 mg  10 mg Oral QHS Lavina Hamman, MD   10 mg at 12/06/14 0042  . atorvastatin (LIPITOR) tablet 40 mg  40 mg Oral Daily Lavina Hamman, MD      . calcium acetate (PHOSLO) capsule 1,334 mg  1,334 mg Oral TID WC Lavina Hamman, MD   1,334 mg at 12/06/14 0800  . cinacalcet (SENSIPAR) tablet 60 mg  60 mg Oral Q breakfast Lavina Hamman, MD      . colchicine tablet 0.6 mg  0.6 mg Oral Once per day on Mon Wed Fri Lavina Hamman, MD      . hydrALAZINE (APRESOLINE) tablet 50 mg  50 mg Oral BID Lavina Hamman, MD   50 mg at 12/06/14 0042  . losartan (COZAAR) tablet 25 mg  25 mg Oral Daily Lavina Hamman, MD      . metoprolol succinate (TOPROL-XL) 24 hr tablet 200 mg  200 mg Oral QHS Lavina Hamman, MD   200 mg at 12/06/14 0042  . mirtazapine (REMERON) tablet 7.5 mg  7.5 mg Oral Daily Lavina Hamman, MD      . ondansetron [facility]) tablet 4 mg  4 mg Oral Q6H PRN Lavina Hamman, MD       Or  . ondansetron [facility]) injection 4 mg  4 mg Intravenous Q6H PRN Lavina Hamman, MD      . oxyCODONE (Oxy IR/ROXICODONE) immediate release tablet 5 mg  5 mg Oral Q6H PRN Lavina Hamman, MD      . pantoprazole (PROTONIX) EC tablet 40 mg  40 mg Oral Daily Lavina Hamman, MD      . sodium chloride 0.9 % injection 3 mL  3 mL  Intravenous Q12H Lavina Hamman, MD   3 mL at 12/06/14 0940  . traMADol (ULTRAM) tablet 50 mg  50 mg Oral Q6H PRN Lavina Hamman, MD   50 mg at 12/06/14 0826   Labs: Basic Metabolic Panel:  Recent Labs Lab 12/05/14 1946 12/06/14 0452  NA 135 136  K 3.9 4.2  CL 92* 93*  CO2 27 26  GLUCOSE 112* 81  BUN 27* 30*  CREATININE 6.52* 7.39*  CALCIUM 9.0 8.8*   Liver Function Tests:  Recent Labs Lab 12/05/14 1946 12/06/14 0452  AST 31 25  ALT 18 16*  ALKPHOS 93 87  BILITOT 1.6* 1.4*  PROT 6.9 7.1  ALBUMIN 2.9* 2.6*  CBC:  Recent Labs Lab 12/05/14 1946 12/06/14 0452  WBC 7.2 5.9  NEUTROABS 4.6  --   HGB 9.7* 9.5*  HCT 28.8* 28.5*  MCV 79.3 78.9  PLT 227 209  Studies/Results: Mr Lumbar Spine Wo Contrast  12/05/2014   CLINICAL DATA:  Low back pain with left hip pain, worsening for 3 months. Chronic renal failure on dialysis. History of gout.  EXAM: MRI LUMBAR SPINE WITHOUT CONTRAST  TECHNIQUE: Multiplanar, multisequence MR imaging of the lumbar spine was performed. No intravenous contrast was administered.  COMPARISON:  Right hip MRI 06/08/2013. Right hip radiographs 06/04/2013. No prior lumbar spine imaging.  FINDINGS: There is straightening of the normal lumbar lordosis. There is no significant listhesis. Disc desiccation is present in the visualized lower thoracic spine and from L1-2 to L3-4. Multiple Schmorl's nodes are noted, including a prominent 1 involving the L3 superior endplate. There is no evidence of compression fracture.  There is marked disc space height loss at L4-5 with abnormal STIR hyperintensity in the disc space and erosive  changes of the endplates. Prominent marrow edema is present throughout the L4 and L5 vertebral bodies with some extension into the pedicles. Marrow edema was present subjacent to the L5 superior endplate on the prior hip MRI but has progressed in the interim. Disc space collapse has progressed since the prior hip radiographs. There is circumferential displacement of low T2 signal material from the disc space. No paraspinal fluid collection or epidural abscess is seen.  The conus medullaris is normal in signal and terminates at T12. The kidneys are atrophic and contain numerous cysts, incompletely evaluated.  T12-L1:  Minimal disc bulging without stenosis.  L1-2:  Minimal disc bulging without stenosis.  L2-3:  Mild disc bulging without stenosis.  L3-4: Mild-to-moderate circumferential disc bulging and moderate facet hypertrophy result in mild spinal stenosis, moderate bilateral lateral recess stenosis, and mild bilateral neural foraminal stenosis.  L4-5: Circumferentially displaced material from the disc space, centrally extruded disc material behind the L4 vertebral body, and severe facet hypertrophy result in mild spinal stenosis, severe bilateral lateral recess stenosis, and severe bilateral neural foraminal stenosis. A small right facet joint effusion is noted.  L5-S1: Mild disc bulging and facet hypertrophy result in mild bilateral lateral recess narrowing without spinal canal or neural foraminal stenosis.  IMPRESSION: 1. Destructive process centered at the L4-5 disc space which has progressed from hip imaging performed in 06/2013 and is concerning for infectious discitis/osteomyelitis. Displaced disc material contributes to mild spinal stenosis and severe bilateral neural foraminal and lateral recess stenosis. No evidence of epidural or paravertebral abscess. Other potential etiologies, such as hemodialysis spondyloarthropathy and gout, are possible but not favored. 2. Mild-to-moderate disc and facet  degeneration elsewhere as above. These results will be called to the ordering clinician or  representative by the Radiologist Assistant, and communication documented in the PACS or zVision Dashboard.   Electronically Signed   By: Logan Bores M.D.   On: 12/05/2014 13:27   ROS: As per HPI otherwise negative. Marland Kitchen Physical Exam: Filed Vitals:   12/05/14 2334 12/06/14 0346 12/06/14 0452 12/06/14 0916  BP: 135/96  187/62 163/73  Pulse: 81  68 72  Temp: 97.9 F (36.6 C)  98.6 F (37 C) 98.3 F (36.8 C)  TempSrc: Oral  Oral Oral  Resp: 17  20 18   Height: 5\' 11"  (1.803 m)     Weight: 51.846 kg (114 lb 4.8 oz)     SpO2: 94% 93% 100% 93%     General:  Slender pleasant elderly AAM NAD Head: Normocephalic, atraumatic, sclera non-icteric, mucus membranes are moist Neck: Supple. JVD not elevated. Lungs: Clear bilaterally to auscultation without wheezes, rales, or rhonchi. Breathing is unlabored. Heart: RRR with S1 S2.  Abdomen: Scaphoid soft, non-tender, non-distended with normoactive bowel sounds. No rebound/guarding. G-U - two ulcerations on uncircumscised penis (nontender) M-S:  Strength and tone appear diminished  for age. Upper extremities : s/p right arm amputation due to traumatic injruy Lower extremities: without edema or ischemic changes, no open wounds , IV in left foot Neuro: Alert and oriented X 3. Moves all extremities spontaneously. Psych:  Responds to questions appropriately with a normal affect. Dialysis Access: left AVGG lower + bruit  Dialysis Orders:   MWF @ [facility]  3hr 45 mins 53kgs 2K/2Ca+ NO heparin L AVG Micera 50 8/24 Calcitriol 0.5 venofer 50 /week 8/24 Hgb 9.7 drifting down from 10.7 8/17 and 11.6 8/10 iPTH 233 - last Ca 10.5 and P 3.4  tsat 45% August - no ferritin since last October- need to order at d/c  Assessment/Plan: 1. L4-5 osteomyelitis - IR to do bx today before initiating antibiotics.- ortho to follow 2. ESRD -  MWF - HD today - he is on no heparin  HD 3. Hypertension/volume  -BP ^ as an outpt - lower ed as tolerated - assess while here, continue current meds  4. Anemia  - Hgb 9.7 consistent with outpt  Hgb - continue ESA (start Aranesp 60 8/31) and weekly Fe - has not had any ferritin since last October - get one now for baseline - likely will be ^ due to discitis 5. 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 Jimmy Footman - hold calcitriol for now - may need to change to non Ca binder 6. Nutrition - NPO for procedure Alb 2.9 -renal  diet + supplements/vit when eating 7. Balanitis/penile ulceration - per primary   Myriam Jacobson, PA-C [place] [phone] 12/06/2014, 9:41 AM

## 2014-12-06 NOTE — Progress Notes (Signed)
Patient ID: Brad [name], male   DOB: [birth year], 79 y.o.   MRN: [id number] [name] is a patient that my partner, Dr. Erlinda Hong, saw in the office and put in an order 8/19 for a MRI of his lumbar spine due to severe pain.  Apparently the MRI was finally performed yesterday and found evidence concerning for possible discitis and osteomyelitis of the lumbar spine.  This is usually treated with long-term IV antibiotics.  Agree with IR consult to try a biopsy and will need an Infectious Disease consult as well.

## 2014-12-06 NOTE — Progress Notes (Signed)
Patient Demographics  Brad [name], is a 79 y.o. male, DOB - [birth year], FUX:[id number]  Admit date - 12/05/2014   Admitting Physician Lavina Hamman, MD  Outpatient Primary MD for the patient is No primary care provider on file.  LOS - 1   Chief Complaint  Patient presents with  . Back Pain         Subjective:   Brad [name] today has, No headache, No chest pain, No abdominal pain - No Nausea, No new weakness tingling or numbness, No Cough - SOB. Complains of lower back pain.  Assessment & Plan    Principal Problem:   Osteomyelitis Active Problems:   HYPERTENSION, BENIGN SYSTEMIC   ESRD on dialysis   Anemia of chronic renal failure, stage 5   Protein-calorie malnutrition, severe   Discitis  osteomyelitis/discitis of lumbar spine - As evident by MRI of lumbar spine done by orthopedic as an outpatient. - Plan is for IR to obtain biopsy - Discussed with ID who will see the patient, broad-spectrum IV vancomycin and ceftazidimeto be  Started after biopsies done. - Follow on blood cultures  End-stage renal disease - Nephrology consulted  Balanitis - wil start on topical hydrocortisone. - counseled about better hygiene  History of gout - Continue with home medication  Essential hypertension - Continue with home medication  Code Status: Full  Family Communication: None at bedside  Disposition Plan: Home when stable   Procedures  None   Consults   Infectious disease Nephrology Orthopedics   Medications  Scheduled Meds: . amLODipine  10 mg Oral QHS  . atorvastatin  40 mg Oral Daily  . calcium acetate  1,334 mg Oral TID WC  . cinacalcet  60 mg Oral Q breakfast  . colchicine  0.6 mg Oral Once per day on Mon Wed Fri  . darbepoetin (ARANESP) injection - DIALYSIS  60 mcg Intravenous Q Wed-HD  . hydrALAZINE  50 mg Oral BID  . losartan  25 mg Oral Daily  .  metoprolol  200 mg Oral QHS  . mirtazapine  7.5 mg Oral Daily  . pantoprazole  40 mg Oral Daily  . sodium chloride  3 mL Intravenous Q12H   Continuous Infusions:  PRN Meds:.acetaminophen **OR** acetaminophen, ondansetron **OR** ondansetron (ZOFRAN) IV, oxyCODONE, traMADol  DVT Prophylaxis  - Heparin -   Lab Results  Component Value Date   PLT 209 12/06/2014    Antibiotics    Anti-infectives    None          Objective:   Filed Vitals:   12/05/14 2334 12/06/14 0346 12/06/14 0452 12/06/14 0916  BP: 135/96  187/62 163/73  Pulse: 81  68 72  Temp: 97.9 F (36.6 C)  98.6 F (37 C) 98.3 F (36.8 C)  TempSrc: Oral  Oral Oral  Resp: 17  20 18   Height: 5\' 11"  (1.803 m)     Weight: 51.846 kg (114 lb 4.8 oz)     SpO2: 94% 93% 100% 93%    Wt Readings from Last 3 Encounters:  12/05/14 51.846 kg (114 lb 4.8 oz)  11/25/14 53.978 kg (119 lb)  08/18/14 52.8 kg (116 lb 6.5 oz)     Intake/Output Summary (Last 24 hours) at 12/06/14 1234 Last  data filed at 12/06/14 0917  Gross per 24 hour  Intake      0 ml  Output      0 ml  Net      0 ml     Physical Exam  Awake Alert, Oriented X 3,  .AT,PERRAL Supple Neck,No JVD, No cervical lymphadenopathy appriciated.  Symmetrical Chest wall movement, Good air movement bilaterally, RRR,No Gallops,Rubs or new Murmurs, No Parasternal Heave +ve B.Sounds, Abd Soft, No tenderness, No organomegaly appriciated, No Cyanosis, Clubbing or edema, No new Rash or bruise    Data Review   Micro Results Recent Results (from the past 240 hour(s))  MRSA PCR Screening     Status: None   Collection Time: 12/06/14 12:59 AM  Result Value Ref Range Status   MRSA by PCR NEGATIVE NEGATIVE Final    Comment:        The GeneXpert MRSA Assay (FDA approved for NASAL specimens only), is one component of a comprehensive MRSA colonization surveillance program. It is not intended to diagnose MRSA infection nor to guide or monitor treatment for MRSA  infections.     Radiology Reports Mr Lumbar Spine Wo Contrast  12/05/2014   CLINICAL DATA:  Low back pain with left hip pain, worsening for 3 months. Chronic renal failure on dialysis. History of gout.  EXAM: MRI LUMBAR SPINE WITHOUT CONTRAST  TECHNIQUE: Multiplanar, multisequence MR imaging of the lumbar spine was performed. No intravenous contrast was administered.  COMPARISON:  Right hip MRI 06/08/2013. Right hip radiographs 06/04/2013. No prior lumbar spine imaging.  FINDINGS: There is straightening of the normal lumbar lordosis. There is no significant listhesis. Disc desiccation is present in the visualized lower thoracic spine and from L1-2 to L3-4. Multiple Schmorl's nodes are noted, including a prominent 1 involving the L3 superior endplate. There is no evidence of compression fracture.  There is marked disc space height loss at L4-5 with abnormal STIR hyperintensity in the disc space and erosive changes of the endplates. Prominent marrow edema is present throughout the L4 and L5 vertebral bodies with some extension into the pedicles. Marrow edema was present subjacent to the L5 superior endplate on the prior hip MRI but has progressed in the interim. Disc space collapse has progressed since the prior hip radiographs. There is circumferential displacement of low T2 signal material from the disc space. No paraspinal fluid collection or epidural abscess is seen.  The conus medullaris is normal in signal and terminates at T12. The kidneys are atrophic and contain numerous cysts, incompletely evaluated.  T12-L1:  Minimal disc bulging without stenosis.  L1-2:  Minimal disc bulging without stenosis.  L2-3:  Mild disc bulging without stenosis.  L3-4: Mild-to-moderate circumferential disc bulging and moderate facet hypertrophy result in mild spinal stenosis, moderate bilateral lateral recess stenosis, and mild bilateral neural foraminal stenosis.  L4-5: Circumferentially displaced material from the disc  space, centrally extruded disc material behind the L4 vertebral body, and severe facet hypertrophy result in mild spinal stenosis, severe bilateral lateral recess stenosis, and severe bilateral neural foraminal stenosis. A small right facet joint effusion is noted.  L5-S1: Mild disc bulging and facet hypertrophy result in mild bilateral lateral recess narrowing without spinal canal or neural foraminal stenosis.  IMPRESSION: 1. Destructive process centered at the L4-5 disc space which has progressed from hip imaging performed in 06/2013 and is concerning for infectious discitis/osteomyelitis. Displaced disc material contributes to mild spinal stenosis and severe bilateral neural foraminal and lateral recess stenosis. No evidence of  epidural or paravertebral abscess. Other potential etiologies, such as hemodialysis spondyloarthropathy and gout, are possible but not favored. 2. Mild-to-moderate disc and facet degeneration elsewhere as above. These results will be called to the ordering clinician or representative by the Radiologist Assistant, and communication documented in the PACS or zVision Dashboard.   Electronically Signed   By: Logan Bores M.D.   On: 12/05/2014 13:27   US Carotid Bilateral  11/28/2014   CLINICAL DATA:  Syncope.  EXAM: BILATERAL CAROTID DUPLEX ULTRASOUND  TECHNIQUE: Pearline Cables scale imaging, color Doppler and duplex ultrasound were performed of bilateral carotid and vertebral arteries in the neck.  COMPARISON:  None.  FINDINGS: Criteria: Quantification of carotid stenosis is based on velocity parameters that correlate the residual internal carotid diameter with NASCET-based stenosis levels, using the diameter of the distal internal carotid lumen as the denominator for stenosis measurement.  The following velocity measurements were obtained:  RIGHT  ICA:  85/23 cm/sec  CCA:  41/2 cm/sec  SYSTOLIC ICA/CCA RATIO:  1.0  DIASTOLIC ICA/CCA RATIO:  3.4  ECA:  54 cm/sec  LEFT  ICA:  113/13 cm/sec  CCA:   87/8 cm/sec  SYSTOLIC ICA/CCA RATIO:  1.6  DIASTOLIC ICA/CCA RATIO:  1.5  ECA:  107 cm/sec  RIGHT CAROTID ARTERY: Mild multifocal atherosclerotic plaque right carotid bifurcation. No flow limiting stenosis. Waveforms unremarkable.  RIGHT VERTEBRAL ARTERY: Patent with antegrade flow. Flow appears diminished.  LEFT CAROTID ARTERY: Mild multifocal atherosclerotic plaque left carotid bifurcation. No flow limiting stenosis. Waveforms unremarkable.  LEFT VERTEBRAL ARTERY: Patent with antegrade flow. Elevated flow velocities in the left vertebral.  IMPRESSION: 1. Mild multifocal atherosclerotic vascular plaque both carotid bifurcations. No flow limiting stenosis. Degree of stenosis less than 50%. 2. Vertebral arteries are patent with antegrade flow. Antegrade flow is diminished in the right vertebral artery. Elevated flow velocities noted in the left vertebral artery . Findings suggestive the presence of bilateral subclavian and/or vertebral artery disease.   Electronically Signed   By: Marcello Moores  Register   On: 11/28/2014 12:11     CBC  Recent Labs Lab 12/05/14 1946 12/06/14 0452  WBC 7.2 5.9  HGB 9.7* 9.5*  HCT 28.8* 28.5*  PLT 227 209  MCV 79.3 78.9  MCH 26.7 26.3  MCHC 33.7 33.3  RDW 19.8* 20.0*  LYMPHSABS 1.0  --   MONOABS 0.9  --   EOSABS 0.6  --   BASOSABS 0.0  --     Chemistries   Recent Labs Lab 12/05/14 1946 12/06/14 0452  NA 135 136  K 3.9 4.2  CL 92* 93*  CO2 27 26  GLUCOSE 112* 81  BUN 27* 30*  CREATININE 6.52* 7.39*  CALCIUM 9.0 8.8*  AST 31 25  ALT 18 16*  ALKPHOS 93 87  BILITOT 1.6* 1.4*   ------------------------------------------------------------------------------------------------------------------ estimated creatinine clearance is 6.2 mL/min (by C-G formula based on Cr of 7.39). ------------------------------------------------------------------------------------------------------------------ No results for input(s): HGBA1C in the last 72  hours. ------------------------------------------------------------------------------------------------------------------ No results for input(s): CHOL, HDL, LDLCALC, TRIG, CHOLHDL, LDLDIRECT in the last 72 hours. ------------------------------------------------------------------------------------------------------------------ No results for input(s): TSH, T4TOTAL, T3FREE, THYROIDAB in the last 72 hours.  Invalid input(s): FREET3 ------------------------------------------------------------------------------------------------------------------ No results for input(s): VITAMINB12, FOLATE, FERRITIN, TIBC, IRON, RETICCTPCT in the last 72 hours.  Coagulation profile  Recent Labs Lab 12/06/14 0452  INR 1.28    No results for input(s): DDIMER in the last 72 hours.  Cardiac Enzymes No results for input(s): CKMB, TROPONINI, MYOGLOBIN in the last 168 hours.  Invalid  input(s): CK ------------------------------------------------------------------------------------------------------------------ Invalid input(s): POCBNP     Time Spent in minutes   35 minutes   Keali Mccraw M.D on 12/06/2014 at 12:34 PM  Between 7am to 7pm - Pager - [phone]  After 7pm go to [email or web address] - password [facility]  [facility]   Office  [phone]

## 2014-12-06 NOTE — Progress Notes (Signed)
Initial Nutrition Assessment  DOCUMENTATION CODES:   Severe malnutrition in context of chronic illness, Underweight  INTERVENTION:   Once diet advances, provide Nepro Shake po BID, each supplement provides 425 kcal and 19 grams protein.  NUTRITION DIAGNOSIS:   Malnutrition related to chronic illness as evidenced by severe depletion of body fat, severe depletion of muscle mass.  GOAL:   Patient will meet greater than or equal to 90% of their needs  MONITOR:   Diet advancement, Weight trends, Labs, I & O's  REASON FOR ASSESSMENT:   Malnutrition Screening Tool    ASSESSMENT:   79 y.o. male with ESRD on MWF HD at [facility] with HTN, hx esoph stricture, s/p right arm amputation, hx lower extremity ulcers presented with back pain. He had an MRI ordered by ortho as an outpt which showed possible osteo/diskitis L4-5.  Pt is currently NPO for procedure today. Pt reports having a good appetite currently and PTA with consuming of at least 3 meals daily. Usual body weight reported to be ~120 lbs. Pt reports recent weight loss is due to fluid. Pt is agreeable to Nepro Shake. RD to order.   Nutrition-Focused physical exam completed. Findings are severe fat depletion, severe muscle depletion, and no edema.   Labs: Low chloride, calcium, ALT, GFR. High BUN, creatinine, total bilirubin.   Diet Order:  Diet NPO time specified Except for: Sips with Meds, Ice Chips  Skin:   (Incision on L arm)  Last BM:  PTA  Height:   Ht Readings from Last 1 Encounters:  12/05/14 5\' 11"  (1.803 m)    Weight:   Wt Readings from Last 1 Encounters:  12/05/14 114 lb 4.8 oz (51.846 kg)    Ideal Body Weight:  73 kg (adjusted for R arm amputation)  BMI:  Body mass index is 15.95 kg/(m^2).  Estimated Nutritional Needs:   Kcal:  1800-2000  Protein:  80-95 grams  Fluid:  Per MD  EDUCATION NEEDS:   No education needs identified at this time  Corrin Parker, MS, RD, LDN Pager # [phone] After  hours/ weekend pager # [phone]

## 2014-12-06 NOTE — Clinical Documentation Improvement (Signed)
Internal Medicine  Noted patient's BMI is 15.95. Please document diagnosis related to BMI if appropriate for this admission.  Thank you    Underweight  Cachexia  Other  Clinically Undetermined       Please exercise your independent, professional judgment when responding. A specific answer is not anticipated or expected.   Thank You,  [place]

## 2014-12-06 NOTE — H&P (Signed)
[facility] History and Physical  Patient: Brad [name]  MRN: [id number]  DOB: Dec 07, 1935  DOS: the patient was seen and examined on 12/05/2014 PCP: No primary care provider on file.  Referring physician: Dr. Vanita Panda  Chief Complaint: Back pain  HPI: Brad [name] is a 79 y.o. male with Past medical history of ESRD on hemodialysis, hypertension, dyslipidemia, gout, GERD. The patient is presenting with compressive back pain. Patient mentions he had a fall few weeks ago and started having complaints of back pain. Has been progressively worsening. He was also recently seen in the hospital for balanitis. He denies having any fever or chills denies having any nausea vomiting abdominal pain diarrhea constipation. He mentions he is compliant with all his medication and no recent change in his medication has been reported. With this he has seen orthopedic as an outpatient and had an MRI today. The MRI was showing possible osteomyelitis and therefore he was sent here for further workup.  The patient is coming from home.  At his baseline ambulates with support And is independent for most of his ADL manages his medication on his own.  Review of Systems: as mentioned in the history of present illness.  A comprehensive review of the other systems is negative.  Past Medical History  Diagnosis Date  . Hypertension   . High cholesterol   . Gout   . Anemia   . Renal disorder     End stage renal disease secondary to HTN nephrosclerosis  . Secondary hyperparathyroidism   . Hypocalcemia   . Colon polyps   . Paroxysmal supraventricular tachycardia   . Status post dilatation of esophageal stricture   . Syncope    Past Surgical History  Procedure Laterality Date  . Right arm amputation     . Revision of arteriovenous goretex graft Left 12/15/2012    Procedure: REVISION OF ARTERIOVENOUS GORETEX GRAFT using 100mm x 20cm Gortex graft;  Surgeon: Mal Misty, MD;  Location: [facility];   Service: Vascular;  Laterality: Left;  . Esophagogastroduodenoscopy N/A 01/27/2014    Procedure: ESOPHAGOGASTRODUODENOSCOPY (EGD);  Surgeon: Beryle Beams, MD;  Location: [facility];  Service: Endoscopy;  Laterality: N/A;  . Esophageal dilation N/A 01/27/2014    Procedure: ESOPHAGEAL DILATION;  Surgeon: Beryle Beams, MD;  Location: [facility] ENDOSCOPY;  Service: Endoscopy;  Laterality: N/A;  Balloon Dilation  . Esophagogastroduodenoscopy N/A 08/15/2014    Procedure: ESOPHAGOGASTRODUODENOSCOPY (EGD);  Surgeon: Carol Ada, MD;  Location: [facility] ENDOSCOPY;  Service: Endoscopy;  Laterality: N/A;  . Colonoscopy N/A 08/17/2014    Procedure: COLONOSCOPY;  Surgeon: Carol Ada, MD;  Location: [facility] ENDOSCOPY;  Service: Endoscopy;  Laterality: N/A;   Social History:  reports that he has never smoked. He does not have any smokeless tobacco history on file. He reports that he does not drink alcohol or use illicit drugs.  No Known Allergies  History reviewed. No pertinent family history.  Prior to Admission medications   Medication Sig Start Date End Date Taking? Authorizing Provider  acetaminophen (TYLENOL) 500 MG tablet Take 1,000 mg by mouth every 6 (six) hours as needed for mild pain or moderate pain.   Yes Historical Provider, MD  amLODipine (NORVASC) 10 MG tablet Take 10 mg by mouth at bedtime. For blood pressure    Yes Historical Provider, MD  atorvastatin (LIPITOR) 40 MG tablet Take 40 mg by mouth daily.    Yes Historical Provider, MD  B Complex-C-Folic Acid (DIALYVITE TABLET) TABS Take 1 tablet by mouth  daily.     Yes Historical Provider, MD  calcium acetate (PHOSLO) 667 MG capsule Take 1,334 mg by mouth 3 (three) times daily with meals. And 1 cap with snack   Yes Historical Provider, MD  clotrimazole (LOTRIMIN) 1 % cream Apply to affected area 2 times daily 11/25/14  Yes Wandra Arthurs, MD  colchicine 0.6 MG tablet Take 0.6 mg by mouth 3 (three) times a week. Monday, wednesday, friday   Yes Historical  Provider, MD  hydrALAZINE (APRESOLINE) 50 MG tablet Take 50 mg by mouth 2 (two) times daily.   Yes Historical Provider, MD  losartan (COZAAR) 25 MG tablet Take 25 mg by mouth daily. 11/13/14  Yes Historical Provider, MD  metoprolol (TOPROL-XL) 200 MG 24 hr tablet Take 200 mg by mouth at bedtime.    Yes Historical Provider, MD  mirtazapine (REMERON) 7.5 MG tablet Take 7.5 mg by mouth daily. 11/22/14  Yes Historical Provider, MD  mupirocin ointment (BACTROBAN) 2 % Place 1 application into the nose 2 (two) times daily. 11/25/14  Yes Wandra Arthurs, MD  omeprazole (PRILOSEC) 20 MG capsule Take 20 mg by mouth daily.     Yes Historical Provider, MD  SENSIPAR 60 MG tablet Take 60 mg by mouth daily. 11/07/14  Yes Historical Provider, MD  traMADol (ULTRAM) 50 MG tablet Take 50 mg by mouth every 6 (six) hours as needed for moderate pain.  11/21/14  Yes Historical Provider, MD  triamcinolone cream (KENALOG) 0.1 % Apply 1 application topically 2 (two) times daily.  11/23/14  Yes Historical Provider, MD    Physical Exam: Filed Vitals:   12/05/14 2300 12/05/14 2334 12/06/14 0346 12/06/14 0452  BP: 134/82 135/96  187/62  Pulse:  81  68  Temp:  97.9 F (36.6 C)  98.6 F (37 C)  TempSrc:  Oral  Oral  Resp:  17  20  Height:  5\' 11"  (1.803 m)    Weight:  51.846 kg (114 lb 4.8 oz)    SpO2:  94% 93% 100%    General: Alert, Awake and Oriented to Time, Place and Person. Appear in mild distress Eyes: PERRL ENT: Oral Mucosa clear moist. Neck: no JVD Cardiovascular: S1 and S2 Present, no Murmur, Peripheral Pulses Present Respiratory: Bilateral Air entry equal and Decreased,  Clear to Auscultation, no Crackles, no wheezes Abdomen: Bowel Sound present, Soft and no tenderness Skin: no Rash Extremities: no Pedal edema, no calf tenderness Neurologic: Grossly no focal neuro deficit.  Labs on Admission:  CBC:  Recent Labs Lab 12/05/14 1946 12/06/14 0452  WBC 7.2 5.9  NEUTROABS 4.6  --   HGB 9.7* 9.5*  HCT 28.8*  28.5*  MCV 79.3 78.9  PLT 227 209    CMP     Component Value Date/Time   NA 135 12/05/2014 1946   K 3.9 12/05/2014 1946   CL 92* 12/05/2014 1946   CO2 27 12/05/2014 1946   GLUCOSE 112* 12/05/2014 1946   BUN 27* 12/05/2014 1946   CREATININE 6.52* 12/05/2014 1946   CREATININE 9.26* 07/09/2010 1205   CALCIUM 9.0 12/05/2014 1946   PROT 6.9 12/05/2014 1946   ALBUMIN 2.9* 12/05/2014 1946   AST 31 12/05/2014 1946   ALT 18 12/05/2014 1946   ALKPHOS 93 12/05/2014 1946   BILITOT 1.6* 12/05/2014 1946   GFRNONAA 7* 12/05/2014 1946   GFRAA 9* 12/05/2014 1946    No results for input(s): LIPASE, AMYLASE in the last 168 hours.  No results for input(s): CKTOTAL, CKMB,  CKMBINDEX, TROPONINI in the last 168 hours. BNP (last 3 results) No results for input(s): BNP in the last 8760 hours.  ProBNP (last 3 results) No results for input(s): PROBNP in the last 8760 hours.   Radiological Exams on Admission: Mr Lumbar Spine Wo Contrast  12/05/2014   CLINICAL DATA:  Low back pain with left hip pain, worsening for 3 months. Chronic renal failure on dialysis. History of gout.  EXAM: MRI LUMBAR SPINE WITHOUT CONTRAST  TECHNIQUE: Multiplanar, multisequence MR imaging of the lumbar spine was performed. No intravenous contrast was administered.  COMPARISON:  Right hip MRI 06/08/2013. Right hip radiographs 06/04/2013. No prior lumbar spine imaging.  FINDINGS: There is straightening of the normal lumbar lordosis. There is no significant listhesis. Disc desiccation is present in the visualized lower thoracic spine and from L1-2 to L3-4. Multiple Schmorl's nodes are noted, including a prominent 1 involving the L3 superior endplate. There is no evidence of compression fracture.  There is marked disc space height loss at L4-5 with abnormal STIR hyperintensity in the disc space and erosive changes of the endplates. Prominent marrow edema is present throughout the L4 and L5 vertebral bodies with some extension into the  pedicles. Marrow edema was present subjacent to the L5 superior endplate on the prior hip MRI but has progressed in the interim. Disc space collapse has progressed since the prior hip radiographs. There is circumferential displacement of low T2 signal material from the disc space. No paraspinal fluid collection or epidural abscess is seen.  The conus medullaris is normal in signal and terminates at T12. The kidneys are atrophic and contain numerous cysts, incompletely evaluated.  T12-L1:  Minimal disc bulging without stenosis.  L1-2:  Minimal disc bulging without stenosis.  L2-3:  Mild disc bulging without stenosis.  L3-4: Mild-to-moderate circumferential disc bulging and moderate facet hypertrophy result in mild spinal stenosis, moderate bilateral lateral recess stenosis, and mild bilateral neural foraminal stenosis.  L4-5: Circumferentially displaced material from the disc space, centrally extruded disc material behind the L4 vertebral body, and severe facet hypertrophy result in mild spinal stenosis, severe bilateral lateral recess stenosis, and severe bilateral neural foraminal stenosis. A small right facet joint effusion is noted.  L5-S1: Mild disc bulging and facet hypertrophy result in mild bilateral lateral recess narrowing without spinal canal or neural foraminal stenosis.  IMPRESSION: 1. Destructive process centered at the L4-5 disc space which has progressed from hip imaging performed in 06/2013 and is concerning for infectious discitis/osteomyelitis. Displaced disc material contributes to mild spinal stenosis and severe bilateral neural foraminal and lateral recess stenosis. No evidence of epidural or paravertebral abscess. Other potential etiologies, such as hemodialysis spondyloarthropathy and gout, are possible but not favored. 2. Mild-to-moderate disc and facet degeneration elsewhere as above. These results will be called to the ordering clinician or representative by the Radiologist Assistant, and  communication documented in the PACS or zVision Dashboard.   Electronically Signed   By: Logan Bores M.D.   On: 12/05/2014 13:27    Assessment/Plan Principal Problem:   Osteomyelitis Active Problems:   HYPERTENSION, BENIGN SYSTEMIC   ESRD on dialysis   Anemia of chronic renal failure, stage 5   Protein-calorie malnutrition, severe   Discitis   1. Osteomyelitis  the patient is presenting with complaints of back pain. MRI is positive for osteomyelitis of L4-L5 region. Discussed with Dr. Ninfa Linden who will be following up on the patient. Recommend IR guided biopsy. Interventional radiology consulted.  holding off on antibiotics as the  patient is hemodynamically stable at present.  2. ESRD on hemodialysis. Nephrology dialysis line called.  3. Balanitis. Wound care consult.  4. History of gout. Continue home medications.  5. Essential hypertension. Continuing home medications.  Advance goals of care discussion:  Full code   Consults:  [facility]  DVT Prophylaxis: subcutaneous Heparin Nutrition:  Nothing by mouth  Disposition: Admitted as inpatient, telemetry unit.  Author: Berle Mull, MD [facility] Pager: [phone] 12/05/2014  If 7PM-7AM, please contact night-coverage [email or web address] Password [email or web address]

## 2014-12-06 NOTE — Progress Notes (Signed)
ANTIBIOTIC CONSULT NOTE - INITIAL  Pharmacy Consult for Ceftazidime + Vancomycin Indication: Osteomyelitis  No Known Allergies  Patient Measurements: Height: 5\' 11"  (180.3 cm) Weight: 114 lb 4.8 oz (51.846 kg) IBW/kg (Calculated) : 75.3  Vital Signs: Temp: 98.3 F (36.8 C) (08/31 0916) Temp Source: Oral (08/31 0916) BP: 156/73 mmHg (08/31 1406) Pulse Rate: 75 (08/31 1406) Intake/Output from previous day:   Intake/Output from this shift: Total I/O In: 240 [P.O.:240] Out: -   Labs:  Recent Labs  12/05/14 1946 12/06/14 0452  WBC 7.2 5.9  HGB 9.7* 9.5*  PLT 227 209  CREATININE 6.52* 7.39*   Estimated Creatinine Clearance: 6.2 mL/min (by C-G formula based on Cr of 7.39). No results for input(s): VANCOTROUGH, VANCOPEAK, VANCORANDOM, GENTTROUGH, GENTPEAK, GENTRANDOM, TOBRATROUGH, TOBRAPEAK, TOBRARND, AMIKACINPEAK, AMIKACINTROU, AMIKACIN in the last 72 hours.   Microbiology: Recent Results (from the past 720 hour(s))  MRSA PCR Screening     Status: None   Collection Time: 12/06/14 12:59 AM  Result Value Ref Range Status   MRSA by PCR NEGATIVE NEGATIVE Final    Comment:        The GeneXpert MRSA Assay (FDA approved for NASAL specimens only), is one component of a comprehensive MRSA colonization surveillance program. It is not intended to diagnose MRSA infection nor to guide or monitor treatment for MRSA infections.     Medical History: Past Medical History  Diagnosis Date  . Hypertension   . High cholesterol   . Gout   . Anemia   . Renal disorder     End stage renal disease secondary to HTN nephrosclerosis  . Secondary hyperparathyroidism   . Hypocalcemia   . Colon polyps   . Paroxysmal supraventricular tachycardia   . Status post dilatation of esophageal stricture   . Syncope     Assessment: 79yo male w/ ESRD on HD MWF seen by ortho yesterday 8/31 >> MRI showed possible osteo >> instructed to go to ED for further workup.  Pt was schedule for HD  today 8/31, but now scheduled for tomorrow morning instead.  Pharmacy consulted to dose ceftazidime + vancomycin for osteo/discitis of lumbar spine s/p biopsy today.  8/31 BCx2 >>  8/31 Wound Cx >>   Ceftaz 8/31 >>  Vanc 8/31 >>   Goal of Therapy:  Pre-HD vanc level 15-25 mcg/mL  Plan:  - Ceftazidime 2g IV x1 now and Q-HD - Vancomycin 1250mg  IV x1 now, then 500mg  IV Q-HD - Will follow HD schedule and order additional doses as appropriate - Monitor C&S, clinical course, vanc levels as appropriate  Drucie Opitz, PharmD Clinical Pharmacist Pager: [phone] 12/06/2014 5:58 PM

## 2014-12-06 NOTE — Consult Note (Signed)
WOC consult requested for back wound.  MRI results report: Destructive process centered at the L4-5 disc space which has progressed from hip imaging performed in 06/2013 and is concerning for infectious discitis/osteomyelitis.   This complex medical condition is beyond the scope of [place] nursing.  Please refer to ortho service for further plan of care. Please re-consult if further assistance is needed.  Thank-you,  Julien Girt MSN, [place], [place], [place], [place]

## 2014-12-06 NOTE — Consult Note (Signed)
Chief Complaint: Patient was seen in consultation today for severe low back pain; L4-5 discitis Chief Complaint  Patient presents with  . Back Pain   at the request of [facility]  Referring Physician(s): Dr Ninfa Linden  History of Present Illness: Brad [name] is a 79 y.o. male   Suffered fall at home 2 weeks ago Continued worsening back pain Now can hardly walk MRI reveals: IMPRESSION: 1. Destructive process centered at the L4-5 disc space which has progressed from hip imaging performed in 06/2013 and is concerning for infectious discitis/osteomyelitis. Displaced disc material contributes to mild spinal stenosis and severe bilateral neural foraminal and lateral recess stenosis. No evidence of epidural or paravertebral abscess. Other potential etiologies, such as hemodialysis spondyloarthropathy and gout, are possible but not favored.  Request for disc aspiration per Dr Ninfa Linden Dr Estanislado Pandy has reviewed imaging and approves procedure Scheduled now for same   Past Medical History  Diagnosis Date  . Hypertension   . High cholesterol   . Gout   . Anemia   . Renal disorder     End stage renal disease secondary to HTN nephrosclerosis  . Secondary hyperparathyroidism   . Hypocalcemia   . Colon polyps   . Paroxysmal supraventricular tachycardia   . Status post dilatation of esophageal stricture   . Syncope     Past Surgical History  Procedure Laterality Date  . Right arm amputation     . Revision of arteriovenous goretex graft Left 12/15/2012    Procedure: REVISION OF ARTERIOVENOUS GORETEX GRAFT using 67mm x 20cm Gortex graft;  Surgeon: Mal Misty, MD;  Location: [facility];  Service: Vascular;  Laterality: Left;  . Esophagogastroduodenoscopy N/A 01/27/2014    Procedure: ESOPHAGOGASTRODUODENOSCOPY (EGD);  Surgeon: Beryle Beams, MD;  Location: [facility];  Service: Endoscopy;  Laterality: N/A;  . Esophageal dilation N/A 01/27/2014    Procedure: ESOPHAGEAL DILATION;   Surgeon: Beryle Beams, MD;  Location: [facility];  Service: Endoscopy;  Laterality: N/A;  Balloon Dilation  . Esophagogastroduodenoscopy N/A 08/15/2014    Procedure: ESOPHAGOGASTRODUODENOSCOPY (EGD);  Surgeon: Carol Ada, MD;  Location: [facility] ENDOSCOPY;  Service: Endoscopy;  Laterality: N/A;  . Colonoscopy N/A 08/17/2014    Procedure: COLONOSCOPY;  Surgeon: Carol Ada, MD;  Location: [facility] ENDOSCOPY;  Service: Endoscopy;  Laterality: N/A;    Allergies: Review of patient's allergies indicates no known allergies.  Medications: Prior to Admission medications   Medication Sig Start Date End Date Taking? Authorizing Provider  acetaminophen (TYLENOL) 500 MG tablet Take 1,000 mg by mouth every 6 (six) hours as needed for mild pain or moderate pain.   Yes Historical Provider, MD  amLODipine (NORVASC) 10 MG tablet Take 10 mg by mouth at bedtime. For blood pressure    Yes Historical Provider, MD  atorvastatin (LIPITOR) 40 MG tablet Take 40 mg by mouth daily.    Yes Historical Provider, MD  B Complex-C-Folic Acid (DIALYVITE TABLET) TABS Take 1 tablet by mouth daily.     Yes Historical Provider, MD  calcium acetate (PHOSLO) 667 MG capsule Take 1,334 mg by mouth 3 (three) times daily with meals. And 1 cap with snack   Yes Historical Provider, MD  clotrimazole (LOTRIMIN) 1 % cream Apply to affected area 2 times daily 11/25/14  Yes Wandra Arthurs, MD  colchicine 0.6 MG tablet Take 0.6 mg by mouth 3 (three) times a week. Monday, wednesday, friday   Yes Historical Provider, MD  hydrALAZINE (APRESOLINE) 50 MG tablet Take 50 mg by  mouth 2 (two) times daily.   Yes Historical Provider, MD  losartan (COZAAR) 25 MG tablet Take 25 mg by mouth daily. 11/13/14  Yes Historical Provider, MD  metoprolol (TOPROL-XL) 200 MG 24 hr tablet Take 200 mg by mouth at bedtime.    Yes Historical Provider, MD  mirtazapine (REMERON) 7.5 MG tablet Take 7.5 mg by mouth daily. 11/22/14  Yes Historical Provider, MD  mupirocin ointment  (BACTROBAN) 2 % Place 1 application into the nose 2 (two) times daily. 11/25/14  Yes Wandra Arthurs, MD  omeprazole (PRILOSEC) 20 MG capsule Take 20 mg by mouth daily.     Yes Historical Provider, MD  SENSIPAR 60 MG tablet Take 60 mg by mouth daily. 11/07/14  Yes Historical Provider, MD  traMADol (ULTRAM) 50 MG tablet Take 50 mg by mouth every 6 (six) hours as needed for moderate pain.  11/21/14  Yes Historical Provider, MD  triamcinolone cream (KENALOG) 0.1 % Apply 1 application topically 2 (two) times daily.  11/23/14  Yes Historical Provider, MD     History reviewed. No pertinent family history.  Social History   Social History  . Marital Status: Married    Spouse Name: N/A  . Number of Children: N/A  . Years of Education: N/A   Social History Main Topics  . Smoking status: Never Smoker   . Smokeless tobacco: None  . Alcohol Use: No  . Drug Use: No  . Sexual Activity: Not Asked   Other Topics Concern  . None   Social History Narrative     Review of Systems: A 12 point ROS discussed and pertinent positives are indicated in the HPI above.  All other systems are negative.  Review of Systems  Constitutional: Positive for activity change, appetite change and fatigue. Negative for fever.  Respiratory: Negative for shortness of breath.   Cardiovascular: Negative for leg swelling.  Musculoskeletal: Positive for back pain.  Neurological: Positive for weakness.  Psychiatric/Behavioral: Negative for behavioral problems and confusion.    Vital Signs: BP 163/73 mmHg  Pulse 72  Temp(Src) 98.3 F (36.8 C) (Oral)  Resp 18  Ht 5\' 11"  (1.803 m)  Wt 114 lb 4.8 oz (51.846 kg)  BMI 15.95 kg/m2  SpO2 93%  Physical Exam  Constitutional: He is oriented to person, place, and time.  Cardiovascular: Normal rate, regular rhythm and normal heart sounds.   Pulmonary/Chest: Effort normal and breath sounds normal.  Abdominal: Soft. Bowel sounds are normal. There is no tenderness.    Musculoskeletal: He exhibits tenderness.  Rt arm amputation Severe low back pain L hip pain  Neurological: He is alert and oriented to person, place, and time.  Skin: Skin is warm and dry.  Psychiatric: He has a normal mood and affect. His behavior is normal. Judgment and thought content normal.  Nursing note and vitals reviewed.   Mallampati Score:  MD Evaluation Airway: WNL Heart: WNL Abdomen: WNL Chest/ Lungs: WNL ASA  Classification: 3 Mallampati/Airway Score: One  Imaging: Mr Lumbar Spine Wo Contrast  12/05/2014   CLINICAL DATA:  Low back pain with left hip pain, worsening for 3 months. Chronic renal failure on dialysis. History of gout.  EXAM: MRI LUMBAR SPINE WITHOUT CONTRAST  TECHNIQUE: Multiplanar, multisequence MR imaging of the lumbar spine was performed. No intravenous contrast was administered.  COMPARISON:  Right hip MRI 06/08/2013. Right hip radiographs 06/04/2013. No prior lumbar spine imaging.  FINDINGS: There is straightening of the normal lumbar lordosis. There is no significant listhesis. Disc  desiccation is present in the visualized lower thoracic spine and from L1-2 to L3-4. Multiple Schmorl's nodes are noted, including a prominent 1 involving the L3 superior endplate. There is no evidence of compression fracture.  There is marked disc space height loss at L4-5 with abnormal STIR hyperintensity in the disc space and erosive changes of the endplates. Prominent marrow edema is present throughout the L4 and L5 vertebral bodies with some extension into the pedicles. Marrow edema was present subjacent to the L5 superior endplate on the prior hip MRI but has progressed in the interim. Disc space collapse has progressed since the prior hip radiographs. There is circumferential displacement of low T2 signal material from the disc space. No paraspinal fluid collection or epidural abscess is seen.  The conus medullaris is normal in signal and terminates at T12. The kidneys are  atrophic and contain numerous cysts, incompletely evaluated.  T12-L1:  Minimal disc bulging without stenosis.  L1-2:  Minimal disc bulging without stenosis.  L2-3:  Mild disc bulging without stenosis.  L3-4: Mild-to-moderate circumferential disc bulging and moderate facet hypertrophy result in mild spinal stenosis, moderate bilateral lateral recess stenosis, and mild bilateral neural foraminal stenosis.  L4-5: Circumferentially displaced material from the disc space, centrally extruded disc material behind the L4 vertebral body, and severe facet hypertrophy result in mild spinal stenosis, severe bilateral lateral recess stenosis, and severe bilateral neural foraminal stenosis. A small right facet joint effusion is noted.  L5-S1: Mild disc bulging and facet hypertrophy result in mild bilateral lateral recess narrowing without spinal canal or neural foraminal stenosis.  IMPRESSION: 1. Destructive process centered at the L4-5 disc space which has progressed from hip imaging performed in 06/2013 and is concerning for infectious discitis/osteomyelitis. Displaced disc material contributes to mild spinal stenosis and severe bilateral neural foraminal and lateral recess stenosis. No evidence of epidural or paravertebral abscess. Other potential etiologies, such as hemodialysis spondyloarthropathy and gout, are possible but not favored. 2. Mild-to-moderate disc and facet degeneration elsewhere as above. These results will be called to the ordering clinician or representative by the Radiologist Assistant, and communication documented in the PACS or zVision Dashboard.   Electronically Signed   By: Logan Bores M.D.   On: 12/05/2014 13:27   US Carotid Bilateral  11/28/2014   CLINICAL DATA:  Syncope.  EXAM: BILATERAL CAROTID DUPLEX ULTRASOUND  TECHNIQUE: Pearline Cables scale imaging, color Doppler and duplex ultrasound were performed of bilateral carotid and vertebral arteries in the neck.  COMPARISON:  None.  FINDINGS: Criteria:  Quantification of carotid stenosis is based on velocity parameters that correlate the residual internal carotid diameter with NASCET-based stenosis levels, using the diameter of the distal internal carotid lumen as the denominator for stenosis measurement.  The following velocity measurements were obtained:  RIGHT  ICA:  85/23 cm/sec  CCA:  27/2 cm/sec  SYSTOLIC ICA/CCA RATIO:  1.0  DIASTOLIC ICA/CCA RATIO:  3.4  ECA:  54 cm/sec  LEFT  ICA:  113/13 cm/sec  CCA:  53/6 cm/sec  SYSTOLIC ICA/CCA RATIO:  1.6  DIASTOLIC ICA/CCA RATIO:  1.5  ECA:  107 cm/sec  RIGHT CAROTID ARTERY: Mild multifocal atherosclerotic plaque right carotid bifurcation. No flow limiting stenosis. Waveforms unremarkable.  RIGHT VERTEBRAL ARTERY: Patent with antegrade flow. Flow appears diminished.  LEFT CAROTID ARTERY: Mild multifocal atherosclerotic plaque left carotid bifurcation. No flow limiting stenosis. Waveforms unremarkable.  LEFT VERTEBRAL ARTERY: Patent with antegrade flow. Elevated flow velocities in the left vertebral.  IMPRESSION: 1. Mild multifocal atherosclerotic vascular plaque  both carotid bifurcations. No flow limiting stenosis. Degree of stenosis less than 50%. 2. Vertebral arteries are patent with antegrade flow. Antegrade flow is diminished in the right vertebral artery. Elevated flow velocities noted in the left vertebral artery . Findings suggestive the presence of bilateral subclavian and/or vertebral artery disease.   Electronically Signed   By: Marcello Moores  Register   On: 11/28/2014 12:11    Labs:  CBC:  Recent Labs  08/17/14 1100 08/18/14 0830 12/05/14 1946 12/06/14 0452  WBC 7.9 7.2 7.2 5.9  HGB 9.9* 9.3* 9.7* 9.5*  HCT 28.8* 26.9* 28.8* 28.5*  PLT 204 200 227 209    COAGS:  Recent Labs  08/14/14 2136 12/06/14 0452  INR 1.06 1.28  APTT 27  --     BMP:  Recent Labs  08/16/14 0443 08/18/14 0915 12/05/14 1946 12/06/14 0452  NA 136 132* 135 136  K 5.7* 4.9 3.9 4.2  CL 94* 98* 92* 93*  CO2  27 22 27 26   GLUCOSE 78 116* 112* 81  BUN 53* 55* 27* 30*  CALCIUM 8.5* 7.7* 9.0 8.8*  CREATININE 8.41* 8.80* 6.52* 7.39*  GFRNONAA 5* 5* 7* 6*  GFRAA 6* 6* 9* 7*    LIVER FUNCTION TESTS:  Recent Labs  08/14/14 1643  08/15/14 1854 08/18/14 0915 12/05/14 1946 12/06/14 0452  BILITOT 0.6  --   --   --  1.6* 1.4*  AST 33  --   --   --  31 25  ALT 11*  --   --   --  18 16*  ALKPHOS 104  --   --   --  93 87  PROT 6.8  --   --   --  6.9 7.1  ALBUMIN 3.1*  < > 3.2* 2.8* 2.9* 2.6*  < > = values in this interval not displayed.  TUMOR MARKERS: No results for input(s): AFPTM, CEA, CA199, CHROMGRNA in the last 8760 hours.  Assessment and Plan:  Severe low back pain MRI reveals osteomyelitis/discitis L 4-5 Now scheduled for aspiration at site Risks and Benefits discussed with the patient including, but not limited to bleeding, infection, damage to adjacent structures or low yield requiring additional tests. All of the patient's questions were answered, patient is agreeable to proceed. Consent signed and in chart.   Thank you for this interesting consult.  I greatly enjoyed meeting CRIST KRUSZKA and look forward to participating in their care.  A copy of this report was sent to the requesting provider on this date.  Signed: Bianca Raneri A 12/06/2014, 10:59 AM   I spent a total of 40 Minutes    in face to face in clinical consultation, greater than 50% of which was counseling/coordinating care for L4-5 disc aspiration

## 2014-12-07 ENCOUNTER — Encounter (HOSPITAL_COMMUNITY): Payer: Self-pay | Admitting: General Practice

## 2014-12-07 ENCOUNTER — Inpatient Hospital Stay (HOSPITAL_COMMUNITY): Payer: Medicare Other

## 2014-12-07 DIAGNOSIS — Z89201 Acquired absence of right upper limb, unspecified level: Secondary | ICD-10-CM

## 2014-12-07 DIAGNOSIS — M4626 Osteomyelitis of vertebra, lumbar region: Principal | ICD-10-CM

## 2014-12-07 DIAGNOSIS — Z992 Dependence on renal dialysis: Secondary | ICD-10-CM

## 2014-12-07 DIAGNOSIS — N186 End stage renal disease: Secondary | ICD-10-CM

## 2014-12-07 MED ORDER — CHLORHEXIDINE GLUCONATE 0.12 % MT SOLN
15.0000 mL | Freq: Two times a day (BID) | OROMUCOSAL | Status: DC
  Administered 2014-12-07 – 2014-12-11 (×9): 15 mL via OROMUCOSAL
  Filled 2014-12-07 (×7): qty 15

## 2014-12-07 MED ORDER — DARBEPOETIN ALFA 60 MCG/0.3ML IJ SOSY: 60.0000 ug | PREFILLED_SYRINGE | INTRAMUSCULAR | Status: DC

## 2014-12-07 MED ORDER — DARBEPOETIN ALFA 60 MCG/0.3ML IJ SOSY
PREFILLED_SYRINGE | INTRAMUSCULAR | Status: AC
  Filled 2014-12-07: qty 0.3

## 2014-12-07 MED ORDER — COLCHICINE 0.6 MG PO TABS
0.3000 mg | ORAL_TABLET | ORAL | Status: DC
  Administered 2014-12-11: 0.3 mg via ORAL
  Filled 2014-12-07: qty 1

## 2014-12-07 MED ORDER — CETYLPYRIDINIUM CHLORIDE 0.05 % MT LIQD
7.0000 mL | Freq: Two times a day (BID) | OROMUCOSAL | Status: DC
  Administered 2014-12-07 – 2014-12-09 (×4): 7 mL via OROMUCOSAL

## 2014-12-07 MED ORDER — CEFTAZIDIME 1 G IJ SOLR
1.0000 g | INTRAMUSCULAR | Status: DC
  Administered 2014-12-07 – 2014-12-09 (×3): 1 g via INTRAVENOUS
  Filled 2014-12-07 (×5): qty 1

## 2014-12-07 MED ORDER — HYDRALAZINE HCL 20 MG/ML IJ SOLN
5.0000 mg | Freq: Four times a day (QID) | INTRAMUSCULAR | Status: DC | PRN
Start: 2014-12-07 — End: 2014-12-11
  Administered 2014-12-10 (×2): 5 mg via INTRAVENOUS
  Filled 2014-12-07 (×2): qty 1

## 2014-12-07 MED ORDER — DARBEPOETIN ALFA 60 MCG/0.3ML IJ SOSY
60.0000 ug | PREFILLED_SYRINGE | INTRAMUSCULAR | Status: AC
  Administered 2014-12-07: 60 ug via INTRAVENOUS

## 2014-12-07 NOTE — Progress Notes (Signed)
Patient returned from hemodialysis complain of shortness of breath, O2 86% on room air, BP 237/97 HR 84.  MD notified.  Will continue to monitor.

## 2014-12-07 NOTE — Progress Notes (Signed)
Received patient- RN notice high b/p on patient. Previous RN, Wells Guiles explained that she already called MD and MD came up to see patient concerning b/p. RN explained that MD is aware and plans on addressing it with more hemodialysis tomorrow. Will pass on to night RN.

## 2014-12-07 NOTE — Consult Note (Signed)
[facility]  Date of Admission:  12/05/2014  Date of Consult:  12/07/2014  Reason for Consult: Osteomyelitis lumbar spine (L4-5) Referring Physician: Posey Pronto  Impression/Recommendation Osteomyelitis lumbar Spine  Await his BCx  Await his lumbar aspirate Cx  He has no hx of TB exposure.   Continue his anbx  ESRD  Appreciate renal f/u.  Thank you so much for this interesting consult,   Bobby Rumpf (pager) [phone] [email or web address]  Brad [name] is an 79 y.o. male.  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 He has had no f/c at home.  He had no f/c at home. He had outpt MRI on 8-30 which showed: Marland Kitchen Destructive process centered at the L4-5 disc space which has progressed from hip imaging performed in 06/2013 and is concerning for infectious discitis/osteomyelitis. Displaced disc material contributes to mild spinal stenosis and severe bilateral neural foraminal and lateral recess stenosis. No evidence of epidural or paravertebral abscess. Other potential etiologies, such as hemodialysis spondyloarthropathy and gout, are possible but not Favored. 2. Mild-to-moderate disc and facet degeneration elsewhere as above. He was sent to hospital after these findings.  He underwent IR guided aspirate on 8-31 (g/s -). Also had BCx sent. He has been started on ceftaz/vanco.   Past Medical History  Diagnosis Date  . Hypertension   . High cholesterol   . Gout   . Anemia   . Renal disorder     End stage renal disease secondary to HTN nephrosclerosis  . Secondary hyperparathyroidism   . Hypocalcemia   . Colon polyps   . Paroxysmal supraventricular tachycardia   . Status post dilatation of esophageal stricture   . Syncope     Past Surgical History  Procedure Laterality Date  . Right arm amputation     . Revision of arteriovenous goretex graft Left  12/15/2012    Procedure: REVISION OF ARTERIOVENOUS GORETEX GRAFT using 30m x 20cm Gortex graft;  Surgeon: JMal Misty MD;  [facility]  Service: Vascular;  Laterality: Left;  . Esophagogastroduodenoscopy N/A 01/27/2014    Procedure: ESOPHAGOGASTRODUODENOSCOPY (EGD);  Surgeon: PBeryle Beams MD;  [facility];  Service: Endoscopy;  Laterality: N/A;  . Esophageal dilation N/A 01/27/2014    Procedure: ESOPHAGEAL DILATION;  Surgeon: PBeryle Beams MD;  Location: [facility];  Service: Endoscopy;  Laterality: N/A;  Balloon Dilation  . Esophagogastroduodenoscopy N/A 08/15/2014    Procedure: ESOPHAGOGASTRODUODENOSCOPY (EGD);  Surgeon: PCarol Ada MD;  [facility];  Service: Endoscopy;  Laterality: N/A;  . Colonoscopy N/A 08/17/2014    Procedure: COLONOSCOPY;  Surgeon: PCarol Ada MD;  [facility];  Service: Endoscopy;  Laterality: N/A;     No Known Allergies  Medications:  Scheduled: . amLODipine  10 mg Oral QHS  . antiseptic oral rinse  7 mL Mouth Rinse q12n4p  . atorvastatin  40 mg Oral Daily  . calcium acetate  1,334 mg Oral TID WC  . cefTAZidime (FORTAZ)  IV  1 g Intravenous Q24H  . chlorhexidine  15 mL Mouth Rinse BID  . cinacalcet  60 mg Oral Q breakfast  . [START ON 12/11/2014] colchicine  0.3 mg Oral Once per day on Mon Thu  . Darbepoetin Alfa      . [START ON 12/13/2014] darbepoetin (ARANESP) injection - DIALYSIS  60 mcg Intravenous Q Wed-HD  .  feeding supplement (NEPRO CARB STEADY)  237 mL Oral BID BM  . heparin subcutaneous  5,000 Units Subcutaneous 3 times per day  . hydrALAZINE  50 mg Oral BID  . hydrocortisone cream   Topical BID  . losartan  25 mg Oral Daily  . metoprolol  200 mg Oral QHS  . mirtazapine  7.5 mg Oral Daily  . pantoprazole  40 mg Oral Daily  . sodium chloride  3 mL Intravenous Q12H    Abtx:  Anti-infectives    Start     Dose/Rate Route Frequency Ordered Stop   12/07/14 2000  cefTAZidime (FORTAZ) 1 g in dextrose 5  % 50 mL IVPB     1 g 100 mL/hr over 30 Minutes Intravenous Every 24 hours 12/07/14 0915     12/07/14 1200  vancomycin (VANCOCIN) 500 mg in sodium chloride 0.9 % 100 mL IVPB     500 mg 100 mL/hr over 60 Minutes Intravenous Every Thu (Hemodialysis) 12/06/14 1834 12/07/14 1116   12/07/14 1200  cefTAZidime (FORTAZ) 2 g in dextrose 5 % 50 mL IVPB  Status:  Discontinued     2 g 100 mL/hr over 30 Minutes Intravenous Every Thu (Hemodialysis) 12/06/14 1834 12/07/14 0915   12/06/14 1900  vancomycin (VANCOCIN) 1,250 mg in sodium chloride 0.9 % 250 mL IVPB     1,250 mg 166.7 mL/hr over 90 Minutes Intravenous  Once 12/06/14 1834 12/07/14 0141   12/06/14 1900  cefTAZidime (FORTAZ) 2 g in dextrose 5 % 50 mL IVPB     2 g 100 mL/hr over 30 Minutes Intravenous  Once 12/06/14 1834 12/06/14 2123      Total days of antibiotics: 1 vanco/ceftaz          Social History:  reports that he has never smoked. He does not have any smokeless tobacco history on file. He reports that he does not drink alcohol or use illicit drugs.  History reviewed. No pertinent family history.  General ROS: no f/c, has had occas issues with HD site (clotting), persistent loose BM, anuric, see HPI, 12 point ROS o/w -  Blood pressure 237/97, pulse 84, temperature 98.5 F (36.9 C), temperature source Oral, resp. rate 18, height _0  (1.803 m), weight 49.9 kg (110 lb 0.2 oz), SpO2 92 %. General appearance: alert, cooperative and no distress Eyes: negative findings: pupils equal, round, reactive to light and accomodation and B arcus senilus Throat: normal findings: oropharynx pink & moist without lesions or evidence of thrush Neck: no adenopathy and supple, symmetrical, trachea midline Lungs: clear to auscultation bilaterally Heart: regular rate and rhythm Abdomen: normal findings: bowel sounds normal and soft, non-tender Extremities: LUE graft- nontender, no heat, +bruit.  RUE missing. no LE edema. grossly nl light touch.     Results for orders placed or performed during the hospital encounter of 12/05/14 (from the past 48 hour(s))  Comprehensive metabolic panel     Status: Abnormal   Collection Time: 12/05/14  7:46 PM  Result Value Ref Range   Sodium 135 135 - 145 mmol/L   Potassium 3.9 3.5 - 5.1 mmol/L   Chloride 92 (L) 101 - 111 mmol/L   CO2 27 22 - 32 mmol/L   Glucose, Bld 112 (H) 65 - 99 mg/dL   BUN 27 (H) 6 - 20 mg/dL   Creatinine, Ser 6.52 (H) 0.61 - 1.24 mg/dL   Calcium 9.0 8.9 - 10.3 mg/dL   Total Protein 6.9 6.5 - 8.1 g/dL   Albumin 2.9 (L)  3.5 - 5.0 g/dL   AST 31 15 - 41 U/L   ALT 18 17 - 63 U/L   Alkaline Phosphatase 93 38 - 126 U/L   Total Bilirubin 1.6 (H) 0.3 - 1.2 mg/dL   GFR calc non Af Amer 7 (L) >60 mL/min   GFR calc Af Amer 9 (L) >60 mL/min    Comment: (NOTE) The eGFR has been calculated using the CKD EPI equation. This calculation has not been validated in all clinical situations. eGFR's persistently <60 mL/min signify possible Chronic Kidney Disease.    Anion gap 16 (H) 5 - 15  CBC with Differential     Status: Abnormal   Collection Time: 12/05/14  7:46 PM  Result Value Ref Range   WBC 7.2 4.0 - 10.5 K/uL   RBC 3.63 (L) 4.22 - 5.81 MIL/uL   Hemoglobin 9.7 (L) 13.0 - 17.0 g/dL   HCT 28.8 (L) 39.0 - 52.0 %   MCV 79.3 78.0 - 100.0 fL   MCH 26.7 26.0 - 34.0 pg   MCHC 33.7 30.0 - 36.0 g/dL   RDW 19.8 (H) 11.5 - 15.5 %   Platelets 227 150 - 400 K/uL   Neutrophils Relative % 63 43 - 77 %   Neutro Abs 4.6 1.7 - 7.7 K/uL   Lymphocytes Relative 14 12 - 46 %   Lymphs Abs 1.0 0.7 - 4.0 K/uL   Monocytes Relative 13 (H) 3 - 12 %   Monocytes Absolute 0.9 0.1 - 1.0 K/uL   Eosinophils Relative 9 (H) 0 - 5 %   Eosinophils Absolute 0.6 0.0 - 0.7 K/uL   Basophils Relative 1 0 - 1 %   Basophils Absolute 0.0 0.0 - 0.1 K/uL  C-reactive protein     Status: Abnormal   Collection Time: 12/05/14  7:46 PM  Result Value Ref Range   CRP 19.9 (H) <1.0 mg/dL  Sedimentation rate     Status:  Abnormal   Collection Time: 12/05/14  7:46 PM  Result Value Ref Range   Sed Rate 87 (H) 0 - 16 mm/hr  I-Stat CG4 Lactic Acid, ED     Status: None   Collection Time: 12/05/14  7:51 PM  Result Value Ref Range   Lactic Acid, Venous 1.01 0.5 - 2.0 mmol/L  MRSA PCR Screening     Status: None   Collection Time: 12/06/14 12:59 AM  Result Value Ref Range   MRSA by PCR NEGATIVE NEGATIVE    Comment:        The GeneXpert MRSA Assay (FDA approved for NASAL specimens only), is one component of a comprehensive MRSA colonization surveillance program. It is not intended to diagnose MRSA infection nor to guide or monitor treatment for MRSA infections.   Comprehensive metabolic panel     Status: Abnormal   Collection Time: 12/06/14  4:52 AM  Result Value Ref Range   Sodium 136 135 - 145 mmol/L   Potassium 4.2 3.5 - 5.1 mmol/L   Chloride 93 (L) 101 - 111 mmol/L   CO2 26 22 - 32 mmol/L   Glucose, Bld 81 65 - 99 mg/dL   BUN 30 (H) 6 - 20 mg/dL   Creatinine, Ser 7.39 (H) 0.61 - 1.24 mg/dL   Calcium 8.8 (L) 8.9 - 10.3 mg/dL   Total Protein 7.1 6.5 - 8.1 g/dL   Albumin 2.6 (L) 3.5 - 5.0 g/dL   AST 25 15 - 41 U/L   ALT 16 (L) 17 - 63 U/L  Alkaline Phosphatase 87 38 - 126 U/L   Total Bilirubin 1.4 (H) 0.3 - 1.2 mg/dL   GFR calc non Af Amer 6 (L) >60 mL/min   GFR calc Af Amer 7 (L) >60 mL/min    Comment: (NOTE) The eGFR has been calculated using the CKD EPI equation. This calculation has not been validated in all clinical situations. eGFR's persistently <60 mL/min signify possible Chronic Kidney Disease.    Anion gap 17 (H) 5 - 15  CBC     Status: Abnormal   Collection Time: 12/06/14  4:52 AM  Result Value Ref Range   WBC 5.9 4.0 - 10.5 K/uL   RBC 3.61 (L) 4.22 - 5.81 MIL/uL   Hemoglobin 9.5 (L) 13.0 - 17.0 g/dL   HCT 28.5 (L) 39.0 - 52.0 %   MCV 78.9 78.0 - 100.0 fL   MCH 26.3 26.0 - 34.0 pg   MCHC 33.3 30.0 - 36.0 g/dL   RDW 20.0 (H) 11.5 - 15.5 %   Platelets 209 150 - 400 K/uL    Protime-INR     Status: Abnormal   Collection Time: 12/06/14  4:52 AM  Result Value Ref Range   Prothrombin Time 16.2 (H) 11.6 - 15.2 seconds   INR 1.28 0.00 - 1.49  Fungus Culture with Smear     Status: None (Preliminary result)   Collection Time: 12/06/14  1:51 PM  Result Value Ref Range   Specimen Description WOUND BACK    Special Requests 5 DISC SPACE L4    Fungal Smear      NO YEAST OR FUNGAL ELEMENTS SEEN Performed at [place] Performed at [facility]    Report Status PENDING   AFB culture with smear     Status: None (Preliminary result)   Collection Time: 12/06/14  1:51 PM  Result Value Ref Range   Specimen Description WOUND BACK    Special Requests 5 DISC SPACE L4    Acid Fast Smear      NO ACID FAST BACILLI SEEN Performed at [facility]    Culture      CULTURE WILL BE EXAMINED FOR 6 WEEKS BEFORE ISSUING A FINAL REPORT Performed at [facility]    Report Status PENDING   Wound culture     Status: None (Preliminary result)   Collection Time: 12/06/14  1:51 PM  Result Value Ref Range   Specimen Description WOUND BACK    Special Requests 5 DISC SPACE L4    Gram Stain      RARE WBC PRESENT,BOTH PMN AND MONONUCLEAR NO SQUAMOUS EPITHELIAL CELLS SEEN NO ORGANISMS SEEN Performed at [facility]    Culture PENDING    Report Status PENDING   Culture, blood (routine x 2)     Status: None (Preliminary result)   Collection Time: 12/06/14  3:55 PM  Result Value Ref Range   Specimen Description BLOOD LEFT FOOT    Special Requests BOTTLES DRAWN AEROBIC ONLY  5CC    Culture NO GROWTH < 24 HOURS    Report Status PENDING   Culture, blood (routine x 2)     Status: None (Preliminary result)   Collection Time: 12/06/14  4:02 PM  Result Value Ref Range   Specimen Description BLOOD RIGHT FOOT    Special Requests BOTTLES DRAWN AEROBIC ONLY  5CC    Culture NO GROWTH < 24 HOURS  Report Status PENDING       Component Value Date/Time   SDES BLOOD RIGHT FOOT 12/06/2014 1602   SPECREQUEST BOTTLES DRAWN AEROBIC ONLY  5CC 12/06/2014 1602   CULT NO GROWTH < 24 HOURS 12/06/2014 1602   REPTSTATUS PENDING 12/06/2014 1602   Dg Chest Port 1 View  12/07/2014   CLINICAL DATA:  Hypoxia.  End-stage renal disease on dialysis.  EXAM: PORTABLE CHEST - 1 VIEW  COMPARISON:  06/08/2013  FINDINGS: The patient is rotated to the right, partially limiting evaluation of the mediastinum. Cardiomediastinal silhouette is grossly unchanged, again with evidence of mild cardiomegaly and aortic tortuosity. There is new, mild pulmonary vascular congestion with indistinctness of the pulmonary vasculature and mild bilateral lung opacities which are greatest in the perihilar regions. No pleural effusion or pneumothorax is identified. No acute osseous abnormality is seen.  IMPRESSION: Mild cardiomegaly with new pulmonary vascular congestion and bilateral lung opacities suggestive of mild edema.   Electronically Signed   By: Logan Bores M.D.   On: 12/07/2014 14:09   Recent Results (from the past 240 hour(s))  MRSA PCR Screening     Status: None   Collection Time: 12/06/14 12:59 AM  Result Value Ref Range Status   MRSA by PCR NEGATIVE NEGATIVE Final    Comment:        The GeneXpert MRSA Assay (FDA approved for NASAL specimens only), is one component of a comprehensive MRSA colonization surveillance program. It is not intended to diagnose MRSA infection nor to guide or monitor treatment for MRSA infections.   Fungus Culture with Smear     Status: None (Preliminary result)   Collection Time: 12/06/14  1:51 PM  Result Value Ref Range Status   Specimen Description WOUND BACK  Final   Special Requests 5 DISC SPACE L4  Final   Fungal Smear   Final    NO YEAST OR FUNGAL ELEMENTS SEEN Performed at [facility]    Culture   Final    CULTURE IN PROGRESS FOR FOUR WEEKS Performed at [facility]    Report Status PENDING  Incomplete  AFB culture with smear     Status: None (Preliminary result)   Collection Time: 12/06/14  1:51 PM  Result Value Ref Range Status   Specimen Description WOUND BACK  Final   Special Requests 5 DISC SPACE L4  Final   Acid Fast Smear   Final    NO ACID FAST BACILLI SEEN Performed at [facility]    Culture   Final    CULTURE WILL BE EXAMINED FOR 6 WEEKS BEFORE ISSUING A FINAL REPORT Performed at [facility]    Report Status PENDING  Incomplete  Wound culture     Status: None (Preliminary result)   Collection Time: 12/06/14  1:51 PM  Result Value Ref Range Status   Specimen Description WOUND BACK  Final   Special Requests 5 DISC SPACE L4  Final   Gram Stain   Final    RARE WBC PRESENT,BOTH PMN AND MONONUCLEAR NO SQUAMOUS EPITHELIAL CELLS SEEN NO ORGANISMS SEEN Performed at [facility]    Culture PENDING  Incomplete   Report Status PENDING  Incomplete  Culture, blood (routine x 2)     Status: None (Preliminary result)   Collection Time: 12/06/14  3:55 PM  Result Value Ref Range Status   Specimen Description BLOOD LEFT FOOT  Final   Special Requests BOTTLES DRAWN AEROBIC ONLY  5CC  Final  Culture NO GROWTH < 24 HOURS  Final   Report Status PENDING  Incomplete  Culture, blood (routine x 2)     Status: None (Preliminary result)   Collection Time: 12/06/14  4:02 PM  Result Value Ref Range Status   Specimen Description BLOOD RIGHT FOOT  Final   Special Requests BOTTLES DRAWN AEROBIC ONLY  5CC  Final   Culture NO GROWTH < 24 HOURS  Final   Report Status PENDING  Incomplete      12/07/2014, 4:20 PM     LOS: 2 days    Records and images were personally reviewed where available.

## 2014-12-07 NOTE — Progress Notes (Signed)
ANTIBIOTIC CONSULT NOTE - FOLLOW UP  Pharmacy Consult for Vancomycin + Ceftazidime Indication: L4-5 osteo/diskitis  No Known Allergies  Patient Measurements: Height: 5\' 11"  (180.3 cm) Weight: 114 lb 10.2 oz (52 kg) IBW/kg (Calculated) : 75.3  Vital Signs: Temp: 97.5 F (36.4 C) (09/01 1030) Temp Source: Oral (09/01 1030) BP: 154/92 mmHg (09/01 1000) Pulse Rate: 76 (09/01 1000) Intake/Output from previous day: 08/31 0701 - 09/01 0700 In: 723 [P.O.:720; I.V.:3] Out: -  Intake/Output from this shift: Total I/O In: -  Out: 1900 [Other:1900]  Labs:  Recent Labs  12/05/14 1946 12/06/14 0452  WBC 7.2 5.9  HGB 9.7* 9.5*  PLT 227 209  CREATININE 6.52* 7.39*   Estimated Creatinine Clearance: 6.3 mL/min (by C-G formula based on Cr of 7.39). No results for input(s): VANCOTROUGH, VANCOPEAK, VANCORANDOM, GENTTROUGH, GENTPEAK, GENTRANDOM, TOBRATROUGH, TOBRAPEAK, TOBRARND, AMIKACINPEAK, AMIKACINTROU, AMIKACIN in the last 72 hours.   Microbiology: Recent Results (from the past 720 hour(s))  MRSA PCR Screening     Status: None   Collection Time: 12/06/14 12:59 AM  Result Value Ref Range Status   MRSA by PCR NEGATIVE NEGATIVE Final    Comment:        The GeneXpert MRSA Assay (FDA approved for NASAL specimens only), is one component of a comprehensive MRSA colonization surveillance program. It is not intended to diagnose MRSA infection nor to guide or monitor treatment for MRSA infections.   Wound culture     Status: None (Preliminary result)   Collection Time: 12/06/14  1:51 PM  Result Value Ref Range Status   Specimen Description WOUND BACK  Final   Special Requests 5 DISC SPACE L4  Final   Gram Stain   Final    RARE WBC PRESENT,BOTH PMN AND MONONUCLEAR NO SQUAMOUS EPITHELIAL CELLS SEEN NO ORGANISMS SEEN Performed at [facility]    Culture PENDING  Incomplete   Report Status PENDING  Incomplete    Anti-infectives    Start     Dose/Rate Route Frequency  Ordered Stop   12/07/14 2000  cefTAZidime (FORTAZ) 1 g in dextrose 5 % 50 mL IVPB     1 g 100 mL/hr over 30 Minutes Intravenous Every 24 hours 12/07/14 0915     12/07/14 1200  vancomycin (VANCOCIN) 500 mg in sodium chloride 0.9 % 100 mL IVPB     500 mg 100 mL/hr over 60 Minutes Intravenous Every Thu (Hemodialysis) 12/06/14 1834 12/14/14 1159   12/07/14 1200  cefTAZidime (FORTAZ) 2 g in dextrose 5 % 50 mL IVPB  Status:  Discontinued     2 g 100 mL/hr over 30 Minutes Intravenous Every Thu (Hemodialysis) 12/06/14 1834 12/07/14 0915   12/06/14 1900  vancomycin (VANCOCIN) 1,250 mg in sodium chloride 0.9 % 250 mL IVPB     1,250 mg 166.7 mL/hr over 90 Minutes Intravenous  Once 12/06/14 1834 12/07/14 0141   12/06/14 1900  cefTAZidime (FORTAZ) 2 g in dextrose 5 % 50 mL IVPB     2 g 100 mL/hr over 30 Minutes Intravenous  Once 12/06/14 1834 12/06/14 2123      Assessment: 89 YOM with ESRD admitted on 8/30 after MRI revealed L4-5 osteo/diskitis. The patient underwent aspiration and culturing by IR on 8/31 followed by the start of Vancomycin + Ceftazidime. The patient did not receive HD on 8/31 and was dialyzed off-schedule on 9/1 AM (3.5 hr BFR 400) and received an appropriate Vancomycin maintenance dose. Plans for further HD sessions are unclear at this time -  so will adjust Ceftazidime to 1g daily until back on the patient's normal HD schedule.   Goal of Therapy:  Pre-HD Vancomycin level of 15-25 mcg/ml Proper antibiotics for infection/cultures adjusted for renal/hepatic function   Plan:  1. No standing Vanc - will f/u HD schedule 2. Adjust Ceftazidime to 1g IV every 24 hours until back on home HD schedule 3. Will continue to follow HD schedule/duration, culture results, LOT, and antibiotic de-escalation plans   Alycia Rossetti, PharmD, BCPS Clinical Pharmacist Pager: [phone] 12/07/2014 1:36 PM

## 2014-12-07 NOTE — Evaluation (Signed)
Physical Therapy Evaluation Patient Details Name: Brad [name] MRN: [id number] DOB: 01-Apr-1936 Today's Date: 12/07/2014   History of Present Illness  Brad [name] is a 79 y.o. male with Past medical history of ESRD on hemodialysis, hypertension, dyslipidemia, gout, GERD.  The patient is presenting with compressive back pain. Patient mentions he had a fall few weeks ago and started having complaints of back pain. Has been progressively worsening.  MRI concerning for osteomyelitis at L4/5.   Clinical Impression  Brad [name] presents with above.  Note decreased strength, balance and mobility during session and requires up to mod A for gait at this time.  Brad [name] very unsteady and would likely fall at home.  Brad [name] will benefit from continued acute services to address deficits.  Brad [name] recommends SNF for follow up at D/C to improve functional mobility and decrease fall risk.  Brad [name] and wife in agreement (Brad [name] talked to wife via phone).      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  Min cues for safety.   Transfers Overall transfer level: Needs assistance Equipment used: 1 person hand held assist Transfers: Sit to/from Stand Sit to Stand: Min assist         General transfer comment: Assist for forward weight shift to rise into standing.  Cues for safety and to wait on therapist.   Ambulation/Gait Ambulation/Gait assistance: Min assist;Mod  assist Ambulation Distance (Feet): 50 Feet Assistive device: 1 person hand held assist Gait Pattern/deviations: Step-through pattern;Staggering right;Staggering left;Trunk flexed;Wide base of support     General Gait Details: Brad [name] very unsteady with gait in hallway without AD.  Brad [name] would likely benefit from quad vs SPC, however did not have on this visit.  Max cues for safety and slower gait speed.  Brad [name] seems to have increased anxiety about breathing and getting SOB, therefore tends to move very quickly to get back to room.   Stairs            Wheelchair Mobility    Modified Rankin (Stroke Patients Only)       Balance Overall balance assessment: Needs assistance Sitting-balance support: Feet supported Sitting balance-Leahy Scale: Fair     Standing balance support: During functional activity;Single extremity supported Standing balance-Leahy Scale: Poor Standing balance comment: Requires up to mod A to maintain dynamic standing balance.                              Pertinent Vitals/Pain Pain Assessment: Faces Faces Pain Scale: Hurts even more Pain Location: back and RLE Pain Descriptors / Indicators: Aching Pain Intervention(s): Limited activity within patient's tolerance;Monitored during session;Repositioned    Home Living Family/patient expects to be discharged to:: Skilled nursing facility Living Arrangements: Spouse/significant other;Children (wife is elderly and son works)               Additional Comments: Note that Brad [name] lives with wife and son, son works during the  day and wife is unable to assist physically.      Prior Function Level of Independence: Needs assistance   Gait / Transfers Assistance Needed: Brad [name] only walking in house and short distance to/from car without AD  ADL's / Homemaking Assistance Needed: Pts wife assisted with bed level bathing and dressing due to fear of falling in shower.         Hand Dominance   Dominant Hand: Left     Extremity/Trunk Assessment   Upper Extremity Assessment: Generalized weakness           Lower Extremity Assessment: Generalized weakness      Cervical / Trunk Assessment: Kyphotic  Communication   Communication: HOH  Cognition Arousal/Alertness: Awake/alert Behavior During Therapy: WFL for tasks assessed/performed Overall Cognitive Status: Within Functional Limits for tasks assessed (somewhat impulsive when asked to move)                      General Comments      Exercises        Assessment/Plan    Brad [name] Assessment Patient needs continued Brad [name] services  Brad [name] Diagnosis Difficulty walking;Generalized weakness;Acute pain   Brad [name] Problem List Decreased strength;Decreased activity tolerance;Decreased balance;Decreased mobility;Decreased knowledge of use of DME;Decreased safety awareness;Decreased knowledge of precautions;Pain  Brad [name] Treatment Interventions DME instruction;Gait training;Functional mobility training;Therapeutic activities;Therapeutic exercise;Balance training;Patient/family education   Brad [name] Goals (Current goals can be found in the Care Plan section) Acute Rehab Brad [name] Goals Patient Stated Goal: to return home Brad [name] Goal Formulation: With patient Time For Goal Achievement: 12/21/14 Potential to Achieve Goals: Good    Frequency Min 3X/week   Barriers to discharge Decreased caregiver support      Co-evaluation               End of Session   Activity Tolerance: Patient limited by fatigue Patient left: in chair;with call bell/phone within reach Nurse Communication: Mobility status         Time: 1431-1456 Brad [name] Time Calculation (min) (ACUTE ONLY): 25 min   Charges:   Brad [name] Evaluation $Initial Brad [name] Evaluation Tier I: 1 Procedure Brad [name] Treatments $Gait Training: 8-22 mins   Brad [name] G CodesDenice Bors 12/07/2014, 3:14 PM

## 2014-12-07 NOTE — Progress Notes (Signed)
Patient Demographics  Brad [name], is a 79 y.o. male, DOB - 20-Jun-1935, GEZ:[id number]  Admit date - 12/05/2014   Admitting Physician Lavina Hamman, MD  Outpatient Primary MD for the patient is No primary care provider on file.  LOS - 2   Chief Complaint  Patient presents with  . Back Pain         Subjective:   Brad [name] today has, No headache, No chest pain, No abdominal pain - No Nausea, No new weakness tingling or numbness, No Cough - SOB. Complains of lower back pain.  Assessment & Plan    Principal Problem:   Osteomyelitis Active Problems:   HYPERTENSION, BENIGN SYSTEMIC   ESRD on dialysis   Anemia of chronic renal failure, stage 5   Protein-calorie malnutrition, severe   Discitis  osteomyelitis/discitis of lumbar spine - As evident by MRI of lumbar spine done by orthopedic as an outpatient. - IR lumbar disc aspiration on 8/31. - Started on IV vancomycin and ceftazidime on 8/31 after blood cultures and aspiration has been done. - Follow on aspiration culture and blood culture  End-stage renal disease - Nephrology consulted  Balanitis -  on topical hydrocortisone. - counseled about better hygiene   History of gout - Continue with home medication  Essential hypertension -Uncontrolled,  Continue with home medication  Hypoxia - She does report shortness of breath, mildly hypoxic after dialysis, most likely related to hypertensive urgency(237/97), will give when necessary hydralazine, given his a.m. Medication, chest x-ray, and keep 1 when necessary oxygen.  Code Status: Full  Family Communication: None at bedside  Disposition Plan: Home when stable   Procedures  None   Consults   Infectious disease Nephrology Orthopedics   Medications  Scheduled Meds: . amLODipine  10 mg Oral QHS  . atorvastatin  40 mg Oral Daily  . calcium acetate  1,334 mg  Oral TID WC  . cefTAZidime (FORTAZ)  IV  1 g Intravenous Q24H  . cinacalcet  60 mg Oral Q breakfast  . [START ON 12/11/2014] colchicine  0.3 mg Oral Once per day on Mon Thu  . Darbepoetin Alfa      . [START ON 12/13/2014] darbepoetin (ARANESP) injection - DIALYSIS  60 mcg Intravenous Q Wed-HD  . feeding supplement (NEPRO CARB STEADY)  237 mL Oral BID BM  . heparin subcutaneous  5,000 Units Subcutaneous 3 times per day  . hydrALAZINE  50 mg Oral BID  . hydrocortisone cream   Topical BID  . losartan  25 mg Oral Daily  . metoprolol  200 mg Oral QHS  . mirtazapine  7.5 mg Oral Daily  . pantoprazole  40 mg Oral Daily  . sodium chloride  3 mL Intravenous Q12H   Continuous Infusions:  PRN Meds:.acetaminophen **OR** acetaminophen, hydrALAZINE, ondansetron **OR** ondansetron (ZOFRAN) IV, oxyCODONE, traMADol  DVT Prophylaxis  - Heparin -   Lab Results  Component Value Date   PLT 209 12/06/2014    Antibiotics    Anti-infectives    Start     Dose/Rate Route Frequency Ordered Stop   12/07/14 2000  cefTAZidime (FORTAZ) 1 g in dextrose 5 % 50 mL IVPB     1 g 100 mL/hr over 30 Minutes Intravenous Every 24  hours 12/07/14 0915     12/07/14 1200  vancomycin (VANCOCIN) 500 mg in sodium chloride 0.9 % 100 mL IVPB     500 mg 100 mL/hr over 60 Minutes Intravenous Every Thu (Hemodialysis) 12/06/14 1834 12/07/14 1116   12/07/14 1200  cefTAZidime (FORTAZ) 2 g in dextrose 5 % 50 mL IVPB  Status:  Discontinued     2 g 100 mL/hr over 30 Minutes Intravenous Every Thu (Hemodialysis) 12/06/14 1834 12/07/14 0915   12/06/14 1900  vancomycin (VANCOCIN) 1,250 mg in sodium chloride 0.9 % 250 mL IVPB     1,250 mg 166.7 mL/hr over 90 Minutes Intravenous  Once 12/06/14 1834 12/07/14 0141   12/06/14 1900  cefTAZidime (FORTAZ) 2 g in dextrose 5 % 50 mL IVPB     2 g 100 mL/hr over 30 Minutes Intravenous  Once 12/06/14 1834 12/06/14 2123          Objective:   Filed Vitals:   12/07/14 1000 12/07/14 1030  12/07/14 1116 12/07/14 1134  BP: 154/92 171/72 237/97   Pulse: 76 78 84   Temp:  97.5 F (36.4 C) 98.5 F (36.9 C)   TempSrc:  Oral Oral   Resp:  19 18   Height:      Weight:  49.9 kg (110 lb 0.2 oz)    SpO2:  98% 86% 92%    Wt Readings from Last 3 Encounters:  12/07/14 49.9 kg (110 lb 0.2 oz)  11/25/14 53.978 kg (119 lb)  08/18/14 52.8 kg (116 lb 6.5 oz)     Intake/Output Summary (Last 24 hours) at 12/07/14 1135 Last data filed at 12/07/14 1030  Gross per 24 hour  Intake    723 ml  Output   1900 ml  Net  -1177 ml     Physical Exam  Awake Alert, Oriented X 3,  [place].AT,PERRAL Supple Neck,No JVD, No cervical lymphadenopathy appriciated.  Symmetrical Chest wall movement, Good air movement bilaterally, RRR,No Gallops,Rubs or new Murmurs, No Parasternal Heave +ve B.Sounds, Abd Soft, No tenderness, No organomegaly appriciated, No Cyanosis, Clubbing or edema, right upper extremity amputated.    Data Review   Micro Results Recent Results (from the past 240 hour(s))  MRSA PCR Screening     Status: None   Collection Time: 12/06/14 12:59 AM  Result Value Ref Range Status   MRSA by PCR NEGATIVE NEGATIVE Final    Comment:        The GeneXpert MRSA Assay (FDA approved for NASAL specimens only), is one component of a comprehensive MRSA colonization surveillance program. It is not intended to diagnose MRSA infection nor to guide or monitor treatment for MRSA infections.   Wound culture     Status: None (Preliminary result)   Collection Time: 12/06/14  1:51 PM  Result Value Ref Range Status   Specimen Description WOUND BACK  Final   Special Requests 5 DISC SPACE L4  Final   Gram Stain   Final    RARE WBC PRESENT,BOTH PMN AND MONONUCLEAR NO SQUAMOUS EPITHELIAL CELLS SEEN NO ORGANISMS SEEN Performed at [facility]    Culture PENDING  Incomplete   Report Status PENDING  Incomplete    Radiology Reports Mr Lumbar Spine Wo Contrast  12/05/2014   CLINICAL  DATA:  Low back pain with left hip pain, worsening for 3 months. Chronic renal failure on dialysis. History of gout.  EXAM: MRI LUMBAR SPINE WITHOUT CONTRAST  TECHNIQUE: Multiplanar, multisequence MR imaging of the lumbar spine was performed. No  intravenous contrast was administered.  COMPARISON:  Right hip MRI 06/08/2013. Right hip radiographs 06/04/2013. No prior lumbar spine imaging.  FINDINGS: There is straightening of the normal lumbar lordosis. There is no significant listhesis. Disc desiccation is present in the visualized lower thoracic spine and from L1-2 to L3-4. Multiple Schmorl's nodes are noted, including a prominent 1 involving the L3 superior endplate. There is no evidence of compression fracture.  There is marked disc space height loss at L4-5 with abnormal STIR hyperintensity in the disc space and erosive changes of the endplates. Prominent marrow edema is present throughout the L4 and L5 vertebral bodies with some extension into the pedicles. Marrow edema was present subjacent to the L5 superior endplate on the prior hip MRI but has progressed in the interim. Disc space collapse has progressed since the prior hip radiographs. There is circumferential displacement of low T2 signal material from the disc space. No paraspinal fluid collection or epidural abscess is seen.  The conus medullaris is normal in signal and terminates at T12. The kidneys are atrophic and contain numerous cysts, incompletely evaluated.  T12-L1:  Minimal disc bulging without stenosis.  L1-2:  Minimal disc bulging without stenosis.  L2-3:  Mild disc bulging without stenosis.  L3-4: Mild-to-moderate circumferential disc bulging and moderate facet hypertrophy result in mild spinal stenosis, moderate bilateral lateral recess stenosis, and mild bilateral neural foraminal stenosis.  L4-5: Circumferentially displaced material from the disc space, centrally extruded disc material behind the L4 vertebral body, and severe facet  hypertrophy result in mild spinal stenosis, severe bilateral lateral recess stenosis, and severe bilateral neural foraminal stenosis. A small right facet joint effusion is noted.  L5-S1: Mild disc bulging and facet hypertrophy result in mild bilateral lateral recess narrowing without spinal canal or neural foraminal stenosis.  IMPRESSION: 1. Destructive process centered at the L4-5 disc space which has progressed from hip imaging performed in 06/2013 and is concerning for infectious discitis/osteomyelitis. Displaced disc material contributes to mild spinal stenosis and severe bilateral neural foraminal and lateral recess stenosis. No evidence of epidural or paravertebral abscess. Other potential etiologies, such as hemodialysis spondyloarthropathy and gout, are possible but not favored. 2. Mild-to-moderate disc and facet degeneration elsewhere as above. These results will be called to the ordering clinician or representative by the Radiologist Assistant, and communication documented in the PACS or zVision Dashboard.   Electronically Signed   By: Logan Bores M.D.   On: 12/05/2014 13:27   US Carotid Bilateral  11/28/2014   CLINICAL DATA:  Syncope.  EXAM: BILATERAL CAROTID DUPLEX ULTRASOUND  TECHNIQUE: Pearline Cables scale imaging, color Doppler and duplex ultrasound were performed of bilateral carotid and vertebral arteries in the neck.  COMPARISON:  None.  FINDINGS: Criteria: Quantification of carotid stenosis is based on velocity parameters that correlate the residual internal carotid diameter with NASCET-based stenosis levels, using the diameter of the distal internal carotid lumen as the denominator for stenosis measurement.  The following velocity measurements were obtained:  RIGHT  ICA:  85/23 cm/sec  CCA:  07/3 cm/sec  SYSTOLIC ICA/CCA RATIO:  1.0  DIASTOLIC ICA/CCA RATIO:  3.4  ECA:  54 cm/sec  LEFT  ICA:  113/13 cm/sec  CCA:  71/0 cm/sec  SYSTOLIC ICA/CCA RATIO:  1.6  DIASTOLIC ICA/CCA RATIO:  1.5  ECA:  107 cm/sec   RIGHT CAROTID ARTERY: Mild multifocal atherosclerotic plaque right carotid bifurcation. No flow limiting stenosis. Waveforms unremarkable.  RIGHT VERTEBRAL ARTERY: Patent with antegrade flow. Flow appears diminished.  LEFT CAROTID ARTERY:  Mild multifocal atherosclerotic plaque left carotid bifurcation. No flow limiting stenosis. Waveforms unremarkable.  LEFT VERTEBRAL ARTERY: Patent with antegrade flow. Elevated flow velocities in the left vertebral.  IMPRESSION: 1. Mild multifocal atherosclerotic vascular plaque both carotid bifurcations. No flow limiting stenosis. Degree of stenosis less than 50%. 2. Vertebral arteries are patent with antegrade flow. Antegrade flow is diminished in the right vertebral artery. Elevated flow velocities noted in the left vertebral artery . Findings suggestive the presence of bilateral subclavian and/or vertebral artery disease.   Electronically Signed   By: Marcello Moores  Register   On: 11/28/2014 12:11     CBC  Recent Labs Lab 12/05/14 1946 12/06/14 0452  WBC 7.2 5.9  HGB 9.7* 9.5*  HCT 28.8* 28.5*  PLT 227 209  MCV 79.3 78.9  MCH 26.7 26.3  MCHC 33.7 33.3  RDW 19.8* 20.0*  LYMPHSABS 1.0  --   MONOABS 0.9  --   EOSABS 0.6  --   BASOSABS 0.0  --     Chemistries   Recent Labs Lab 12/05/14 1946 12/06/14 0452  NA 135 136  K 3.9 4.2  CL 92* 93*  CO2 27 26  GLUCOSE 112* 81  BUN 27* 30*  CREATININE 6.52* 7.39*  CALCIUM 9.0 8.8*  AST 31 25  ALT 18 16*  ALKPHOS 93 87  BILITOT 1.6* 1.4*   ------------------------------------------------------------------------------------------------------------------ estimated creatinine clearance is 6 mL/min (by C-G formula based on Cr of 7.39). ------------------------------------------------------------------------------------------------------------------ No results for input(s): HGBA1C in the last 72  hours. ------------------------------------------------------------------------------------------------------------------ No results for input(s): CHOL, HDL, LDLCALC, TRIG, CHOLHDL, LDLDIRECT in the last 72 hours. ------------------------------------------------------------------------------------------------------------------ No results for input(s): TSH, T4TOTAL, T3FREE, THYROIDAB in the last 72 hours.  Invalid input(s): FREET3 ------------------------------------------------------------------------------------------------------------------ No results for input(s): VITAMINB12, FOLATE, FERRITIN, TIBC, IRON, RETICCTPCT in the last 72 hours.  Coagulation profile  Recent Labs Lab 12/06/14 0452  INR 1.28    No results for input(s): DDIMER in the last 72 hours.  Cardiac Enzymes No results for input(s): CKMB, TROPONINI, MYOGLOBIN in the last 168 hours.  Invalid input(s): CK ------------------------------------------------------------------------------------------------------------------ Invalid input(s): POCBNP     Time Spent in minutes   35 minutes   Skye Plamondon M.D on 12/07/2014 at 11:35 AM  Between 7am to 7pm - Pager - [phone]  After 7pm go to [email or web address] - password [email or web address]  [facility]   Office  [phone]

## 2014-12-07 NOTE — Procedures (Signed)
I have seen and examined this patient and agree with the plan of care   Osteo  L4/ L 5    Disc     Vanco and Fortaz  BP 170/90 no edema   Goal 2 L   Lungs clear  K 4.2  Hb 9.5   [name] W 12/07/2014, 9:34 AM

## 2014-12-08 DIAGNOSIS — L8990 Pressure ulcer of unspecified site, unspecified stage: Secondary | ICD-10-CM | POA: Insufficient documentation

## 2014-12-08 LAB — BASIC METABOLIC PANEL
ANION GAP: 14 (ref 5–15)
BUN: 23 mg/dL — ABNORMAL HIGH (ref 6–20)
CALCIUM: 8.8 mg/dL — AB (ref 8.9–10.3)
CO2: 28 mmol/L (ref 22–32)
Chloride: 95 mmol/L — ABNORMAL LOW (ref 101–111)
Creatinine, Ser: 6.25 mg/dL — ABNORMAL HIGH (ref 0.61–1.24)
GFR calc Af Amer: 9 mL/min — ABNORMAL LOW (ref >60)
GFR calc non Af Amer: 8 mL/min — ABNORMAL LOW (ref >60)
GLUCOSE: 106 mg/dL — AB (ref 65–99)
POTASSIUM: 4.3 mmol/L (ref 3.5–5.1)
Sodium: 137 mmol/L (ref 135–145)

## 2014-12-08 LAB — CBC
HEMATOCRIT: 30.6 % — AB (ref 39.0–52.0)
Hemoglobin: 10.1 g/dL — ABNORMAL LOW (ref 13.0–17.0)
MCH: 26.4 pg (ref 26.0–34.0)
MCHC: 33 g/dL (ref 30.0–36.0)
MCV: 79.9 fL (ref 78.0–100.0)
Platelets: 228 10*3/uL (ref 150–400)
RBC: 3.83 MIL/uL — ABNORMAL LOW (ref 4.22–5.81)
RDW: 20.1 % — AB (ref 11.5–15.5)
WBC: 7.1 10*3/uL (ref 4.0–10.5)

## 2014-12-08 MED ORDER — METOPROLOL SUCCINATE ER 100 MG PO TB24
100.0000 mg | ORAL_TABLET | Freq: Every day | ORAL | Status: DC
  Administered 2014-12-08 – 2014-12-10 (×3): 100 mg via ORAL
  Filled 2014-12-08 (×3): qty 1

## 2014-12-08 MED ORDER — LOSARTAN POTASSIUM 25 MG PO TABS
25.0000 mg | ORAL_TABLET | Freq: Every day | ORAL | Status: DC
  Administered 2014-12-09 – 2014-12-11 (×3): 25 mg via ORAL
  Filled 2014-12-08 (×3): qty 1

## 2014-12-08 MED ORDER — VANCOMYCIN HCL 500 MG IV SOLR
500.0000 mg | Freq: Once | INTRAVENOUS | Status: AC
  Administered 2014-12-08: 500 mg via INTRAVENOUS
  Filled 2014-12-08: qty 500

## 2014-12-08 MED ORDER — AMLODIPINE BESYLATE 5 MG PO TABS
5.0000 mg | ORAL_TABLET | Freq: Every day | ORAL | Status: DC
  Administered 2014-12-08 – 2014-12-10 (×3): 5 mg via ORAL
  Filled 2014-12-08 (×3): qty 1

## 2014-12-08 NOTE — Progress Notes (Signed)
ANTIBIOTIC CONSULT NOTE - FOLLOW UP  Pharmacy Consult for Vancomycin + Ceftazidime Indication: L4-5 osteo/diskitis  No Known Allergies  Patient Measurements: Height: 5\' 11"  (180.3 cm) Weight: 107 lb 14.9 oz (48.958 kg) IBW/kg (Calculated) : 75.3  Vital Signs: Temp: 98.1 F (36.7 C) (09/02 0931) Temp Source: Oral (09/02 0931) BP: 120/55 mmHg (09/02 0931) Pulse Rate: 82 (09/02 0931) Intake/Output from previous day: 09/01 0701 - 09/02 0700 In: 3 [I.V.:3] Out: 1900  Intake/Output from this shift: Total I/O In: 82 [P.O.:490] Out: 0   Labs:  Recent Labs  12/05/14 1946 12/06/14 0452 12/08/14 0518  WBC 7.2 5.9 7.1  HGB 9.7* 9.5* 10.1*  PLT 227 209 228  CREATININE 6.52* 7.39* 6.25*   Estimated Creatinine Clearance: 7 mL/min (by C-G formula based on Cr of 6.25). No results for input(s): VANCOTROUGH, VANCOPEAK, VANCORANDOM, GENTTROUGH, GENTPEAK, GENTRANDOM, TOBRATROUGH, TOBRAPEAK, TOBRARND, AMIKACINPEAK, AMIKACINTROU, AMIKACIN in the last 72 hours.   Microbiology: Recent Results (from the past 720 hour(s))  MRSA PCR Screening     Status: None   Collection Time: 12/06/14 12:59 AM  Result Value Ref Range Status   MRSA by PCR NEGATIVE NEGATIVE Final    Comment:        The GeneXpert MRSA Assay (FDA approved for NASAL specimens only), is one component of a comprehensive MRSA colonization surveillance program. It is not intended to diagnose MRSA infection nor to guide or monitor treatment for MRSA infections.   Fungus Culture with Smear     Status: None (Preliminary result)   Collection Time: 12/06/14  1:51 PM  Result Value Ref Range Status   Specimen Description WOUND BACK  Final   Special Requests 5 DISC SPACE L4  Final   Fungal Smear   Final    NO YEAST OR FUNGAL ELEMENTS SEEN Performed at [facility]    Culture   Final    CULTURE IN PROGRESS FOR FOUR WEEKS Performed at [facility]    Report Status PENDING  Incomplete  AFB culture with  smear     Status: None (Preliminary result)   Collection Time: 12/06/14  1:51 PM  Result Value Ref Range Status   Specimen Description WOUND BACK  Final   Special Requests 5 DISC SPACE L4  Final   Acid Fast Smear   Final    NO ACID FAST BACILLI SEEN Performed at [facility]    Culture   Final    CULTURE WILL BE EXAMINED FOR 6 WEEKS BEFORE ISSUING A FINAL REPORT Performed at [facility]    Report Status PENDING  Incomplete  Wound culture     Status: None (Preliminary result)   Collection Time: 12/06/14  1:51 PM  Result Value Ref Range Status   Specimen Description WOUND BACK  Final   Special Requests 5 DISC SPACE L4  Final   Gram Stain   Final    RARE WBC PRESENT,BOTH PMN AND MONONUCLEAR NO SQUAMOUS EPITHELIAL CELLS SEEN NO ORGANISMS SEEN Performed at [facility]    Culture   Final    NO GROWTH 1 DAY Performed at [facility]    Report Status PENDING  Incomplete  Culture, blood (routine x 2)     Status: None (Preliminary result)   Collection Time: 12/06/14  3:55 PM  Result Value Ref Range Status   Specimen Description BLOOD LEFT FOOT  Final   Special Requests BOTTLES DRAWN AEROBIC ONLY  5CC  Final   Culture NO GROWTH <  24 HOURS  Final   Report Status PENDING  Incomplete  Culture, blood (routine x 2)     Status: None (Preliminary result)   Collection Time: 12/06/14  4:02 PM  Result Value Ref Range Status   Specimen Description BLOOD RIGHT FOOT  Final   Special Requests BOTTLES DRAWN AEROBIC ONLY  5CC  Final   Culture NO GROWTH < 24 HOURS  Final   Report Status PENDING  Incomplete    Anti-infectives    Start     Dose/Rate Route Frequency Ordered Stop   12/07/14 2000  cefTAZidime (FORTAZ) 1 g in dextrose 5 % 50 mL IVPB     1 g 100 mL/hr over 30 Minutes Intravenous Every 24 hours 12/07/14 0915     12/07/14 1200  vancomycin (VANCOCIN) 500 mg in sodium chloride 0.9 % 100 mL IVPB     500 mg 100 mL/hr over 60 Minutes Intravenous Every Thu  (Hemodialysis) 12/06/14 1834 12/07/14 1116   12/07/14 1200  cefTAZidime (FORTAZ) 2 g in dextrose 5 % 50 mL IVPB  Status:  Discontinued     2 g 100 mL/hr over 30 Minutes Intravenous Every Thu (Hemodialysis) 12/06/14 1834 12/07/14 0915   12/06/14 1900  vancomycin (VANCOCIN) 1,250 mg in sodium chloride 0.9 % 250 mL IVPB     1,250 mg 166.7 mL/hr over 90 Minutes Intravenous  Once 12/06/14 1834 12/07/14 0141   12/06/14 1900  cefTAZidime (FORTAZ) 2 g in dextrose 5 % 50 mL IVPB     2 g 100 mL/hr over 30 Minutes Intravenous  Once 12/06/14 1834 12/06/14 2123      Assessment: 76 YOM with ESRD admitted on 8/30 after MRI revealed L4-5 osteo/diskitis. The patient underwent aspiration and culturing by IR on 8/31 followed by the start of Vancomycin + Ceftazidime. The patient did not receive HD on 8/31 and was dialyzed off-schedule (normally a MWF schedule) on 9/1 AM (3.5 hr BFR 400) and received an appropriate Vancomycin maintenance dose after this HD session.   Still with volume overload today, so he is to go for another HD session.  Plans for future HD sessions are unclear at this time (unsure if he will go back on schedule after this or if he will need another session over the weekend).  WBC wnl, afeb. ID following. No growth on cultures.  Goal of Therapy:  Pre-HD Vancomycin level of 15-25 mcg/ml Proper antibiotics for infection/cultures adjusted for renal/hepatic function   Plan:  - Vancomycin 500mg  IV x1 today after HD session - will follow for HD schedule plans. NO STANDING VANCOMYCIN IS ORDERED AT THIS TIME - Ceftazidime 1g IV q24h until he is on a clear HD schedule -  Follow HD schedule/tolerance, c/s, LOT, and antibiotic de-escalation plans   Chrstopher Malenfant D. Pacey Willadsen, PharmD, BCPS Clinical Pharmacist Pager: [phone] 12/08/2014 10:42 AM

## 2014-12-08 NOTE — Progress Notes (Signed)
Patient Demographics  Brad [name], is a 79 y.o. male, DOB - 06-Mar-1936, MVE:[id number]  Admit date - 12/05/2014   Admitting Physician Lavina Hamman, MD  Outpatient Primary MD for the patient is No primary care provider on file.  LOS - 3   Chief Complaint  Patient presents with  . Back Pain         Subjective:   [name] today has, No headache, No chest pain, No abdominal pain - No Nausea, No new weakness tingling or numbness, No Cough - worse shortness of breath.  Assessment & Plan    Principal Problem:   Osteomyelitis Active Problems:   HYPERTENSION, BENIGN SYSTEMIC   ESRD on dialysis   Anemia of chronic renal failure, stage 5   Protein-calorie malnutrition, severe   Discitis  osteomyelitis/discitis of lumbar spine - As evident by MRI of lumbar spine done by orthopedic as an outpatient. - IR lumbar disc aspiration on 8/31. - Started on IV vancomycin and ceftazidime on 8/31 after blood cultures and aspiration has been done. - Follow on aspiration culture and blood culture (no growth to date) - ID following  End-stage renal disease - Nephrology consulted - Patient received hemodialysis yesterday on schedule, will receive another dialysis today given shortness of breath, evidence of volume overload on chest x-ray.  Balanitis -  on topical hydrocortisone.   History of gout - Continue with home medication  Essential hypertension - Labile, continue with home medication.  Acute hypoxic respiratory failure - Chest x-ray showing evidence of volume overload, patient will receive another session of hemodialysis today.  Code Status: Full  Family Communication: None at bedside  Disposition Plan: Home when stable   Procedures  None   Consults   Infectious disease Nephrology Orthopedics   Medications  Scheduled Meds: . amLODipine  5 mg Oral QHS  .  antiseptic oral rinse  7 mL Mouth Rinse q12n4p  . atorvastatin  40 mg Oral Daily  . calcium acetate  1,334 mg Oral TID WC  . cefTAZidime (FORTAZ)  IV  1 g Intravenous Q24H  . chlorhexidine  15 mL Mouth Rinse BID  . cinacalcet  60 mg Oral Q breakfast  . [START ON 12/11/2014] colchicine  0.3 mg Oral Once per day on Mon Thu  . [START ON 12/13/2014] darbepoetin (ARANESP) injection - DIALYSIS  60 mcg Intravenous Q Wed-HD  . feeding supplement (NEPRO CARB STEADY)  237 mL Oral BID BM  . heparin subcutaneous  5,000 Units Subcutaneous 3 times per day  . hydrALAZINE  50 mg Oral BID  . hydrocortisone cream   Topical BID  . [START ON 12/09/2014] losartan  25 mg Oral Daily  . metoprolol  100 mg Oral QHS  . mirtazapine  7.5 mg Oral Daily  . pantoprazole  40 mg Oral Daily  . sodium chloride  3 mL Intravenous Q12H  . vancomycin  500 mg Intravenous Once   Continuous Infusions:  PRN Meds:.acetaminophen **OR** acetaminophen, hydrALAZINE, ondansetron **OR** ondansetron (ZOFRAN) IV, oxyCODONE, traMADol  DVT Prophylaxis  - Heparin -   Lab Results  Component Value Date   PLT 228 12/08/2014    Antibiotics    Anti-infectives    Start     Dose/Rate Route Frequency Ordered  Stop   12/08/14 1300  vancomycin (VANCOCIN) 500 mg in sodium chloride 0.9 % 100 mL IVPB     500 mg 100 mL/hr over 60 Minutes Intravenous  Once 12/08/14 1047     12/07/14 2000  cefTAZidime (FORTAZ) 1 g in dextrose 5 % 50 mL IVPB     1 g 100 mL/hr over 30 Minutes Intravenous Every 24 hours 12/07/14 0915     12/07/14 1200  vancomycin (VANCOCIN) 500 mg in sodium chloride 0.9 % 100 mL IVPB     500 mg 100 mL/hr over 60 Minutes Intravenous Every Thu (Hemodialysis) 12/06/14 1834 12/07/14 1116   12/07/14 1200  cefTAZidime (FORTAZ) 2 g in dextrose 5 % 50 mL IVPB  Status:  Discontinued     2 g 100 mL/hr over 30 Minutes Intravenous Every Thu (Hemodialysis) 12/06/14 1834 12/07/14 0915   12/06/14 1900  vancomycin (VANCOCIN) 1,250 mg in sodium  chloride 0.9 % 250 mL IVPB     1,250 mg 166.7 mL/hr over 90 Minutes Intravenous  Once 12/06/14 1834 12/07/14 0141   12/06/14 1900  cefTAZidime (FORTAZ) 2 g in dextrose 5 % 50 mL IVPB     2 g 100 mL/hr over 30 Minutes Intravenous  Once 12/06/14 1834 12/06/14 2123          Objective:   Filed Vitals:   12/07/14 1727 12/07/14 2056 12/08/14 0520 12/08/14 0931  BP: 148/100 134/75 114/65 120/55  Pulse: 88 83 74 82  Temp: 98.7 F (37.1 C) 98.7 F (37.1 C) 98 F (36.7 C) 98.1 F (36.7 C)  TempSrc: Oral Oral Oral Oral  Resp: 18 18 14 16   Height:      Weight:  48.958 kg (107 lb 14.9 oz)    SpO2: 99% 100% 97% 98%    Wt Readings from Last 3 Encounters:  12/07/14 48.958 kg (107 lb 14.9 oz)  11/25/14 53.978 kg (119 lb)  08/18/14 52.8 kg (116 lb 6.5 oz)     Intake/Output Summary (Last 24 hours) at 12/08/14 1221 Last data filed at 12/08/14 1033  Gross per 24 hour  Intake    493 ml  Output      0 ml  Net    493 ml     Physical Exam  Awake Alert, Oriented X 3,  [place].AT,PERRAL Supple Neck,No JVD, No cervical lymphadenopathy appriciated.  Symmetrical Chest wall movement, Good air movement bilaterally, bibasilar crackles RRR,No Gallops,Rubs or new Murmurs, No Parasternal Heave +ve B.Sounds, Abd Soft, No tenderness, No organomegaly appriciated, No Cyanosis, Clubbing or edema, right upper extremity amputated.    Data Review   Micro Results Recent Results (from the past 240 hour(s))  MRSA PCR Screening     Status: None   Collection Time: 12/06/14 12:59 AM  Result Value Ref Range Status   MRSA by PCR NEGATIVE NEGATIVE Final    Comment:        The GeneXpert MRSA Assay (FDA approved for NASAL specimens only), is one component of a comprehensive MRSA colonization surveillance program. It is not intended to diagnose MRSA infection nor to guide or monitor treatment for MRSA infections.   Fungus Culture with Smear     Status: None (Preliminary result)   Collection Time:  12/06/14  1:51 PM  Result Value Ref Range Status   Specimen Description WOUND BACK  Final   Special Requests 5 DISC SPACE L4  Final   Fungal Smear   Final    NO YEAST OR FUNGAL ELEMENTS SEEN Performed at  [facility]   Final    CULTURE IN PROGRESS FOR FOUR WEEKS Performed at [facility]    Report Status PENDING  Incomplete  AFB culture with smear     Status: None (Preliminary result)   Collection Time: 12/06/14  1:51 PM  Result Value Ref Range Status   Specimen Description WOUND BACK  Final   Special Requests 5 DISC SPACE L4  Final   Acid Fast Smear   Final    NO ACID FAST BACILLI SEEN Performed at [facility]    Culture   Final    CULTURE WILL BE EXAMINED FOR 6 WEEKS BEFORE ISSUING A FINAL REPORT Performed at [facility]    Report Status PENDING  Incomplete  Wound culture     Status: None (Preliminary result)   Collection Time: 12/06/14  1:51 PM  Result Value Ref Range Status   Specimen Description WOUND BACK  Final   Special Requests 5 DISC SPACE L4  Final   Gram Stain   Final    RARE WBC PRESENT,BOTH PMN AND MONONUCLEAR NO SQUAMOUS EPITHELIAL CELLS SEEN NO ORGANISMS SEEN Performed at [facility]    Culture   Final    NO GROWTH 1 DAY Performed at [facility]    Report Status PENDING  Incomplete  Culture, blood (routine x 2)     Status: None (Preliminary result)   Collection Time: 12/06/14  3:55 PM  Result Value Ref Range Status   Specimen Description BLOOD LEFT FOOT  Final   Special Requests BOTTLES DRAWN AEROBIC ONLY  5CC  Final   Culture NO GROWTH < 24 HOURS  Final   Report Status PENDING  Incomplete  Culture, blood (routine x 2)     Status: None (Preliminary result)   Collection Time: 12/06/14  4:02 PM  Result Value Ref Range Status   Specimen Description BLOOD RIGHT FOOT  Final   Special Requests BOTTLES DRAWN AEROBIC ONLY  5CC  Final   Culture NO GROWTH < 24 HOURS  Final   Report Status  PENDING  Incomplete    Radiology Reports Mr Lumbar Spine Wo Contrast  12/05/2014   CLINICAL DATA:  Low back pain with left hip pain, worsening for 3 months. Chronic renal failure on dialysis. History of gout.  EXAM: MRI LUMBAR SPINE WITHOUT CONTRAST  TECHNIQUE: Multiplanar, multisequence MR imaging of the lumbar spine was performed. No intravenous contrast was administered.  COMPARISON:  Right hip MRI 06/08/2013. Right hip radiographs 06/04/2013. No prior lumbar spine imaging.  FINDINGS: There is straightening of the normal lumbar lordosis. There is no significant listhesis. Disc desiccation is present in the visualized lower thoracic spine and from L1-2 to L3-4. Multiple Schmorl's nodes are noted, including a prominent 1 involving the L3 superior endplate. There is no evidence of compression fracture.  There is marked disc space height loss at L4-5 with abnormal STIR hyperintensity in the disc space and erosive changes of the endplates. Prominent marrow edema is present throughout the L4 and L5 vertebral bodies with some extension into the pedicles. Marrow edema was present subjacent to the L5 superior endplate on the prior hip MRI but has progressed in the interim. Disc space collapse has progressed since the prior hip radiographs. There is circumferential displacement of low T2 signal material from the disc space. No paraspinal fluid collection or epidural abscess is seen.  The conus medullaris is normal in signal and terminates at T12. The  kidneys are atrophic and contain numerous cysts, incompletely evaluated.  T12-L1:  Minimal disc bulging without stenosis.  L1-2:  Minimal disc bulging without stenosis.  L2-3:  Mild disc bulging without stenosis.  L3-4: Mild-to-moderate circumferential disc bulging and moderate facet hypertrophy result in mild spinal stenosis, moderate bilateral lateral recess stenosis, and mild bilateral neural foraminal stenosis.  L4-5: Circumferentially displaced material from the disc  space, centrally extruded disc material behind the L4 vertebral body, and severe facet hypertrophy result in mild spinal stenosis, severe bilateral lateral recess stenosis, and severe bilateral neural foraminal stenosis. A small right facet joint effusion is noted.  L5-S1: Mild disc bulging and facet hypertrophy result in mild bilateral lateral recess narrowing without spinal canal or neural foraminal stenosis.  IMPRESSION: 1. Destructive process centered at the L4-5 disc space which has progressed from hip imaging performed in 06/2013 and is concerning for infectious discitis/osteomyelitis. Displaced disc material contributes to mild spinal stenosis and severe bilateral neural foraminal and lateral recess stenosis. No evidence of epidural or paravertebral abscess. Other potential etiologies, such as hemodialysis spondyloarthropathy and gout, are possible but not favored. 2. Mild-to-moderate disc and facet degeneration elsewhere as above. These results will be called to the ordering clinician or representative by the Radiologist Assistant, and communication documented in the PACS or zVision Dashboard.   Electronically Signed   By: Logan Bores M.D.   On: 12/05/2014 13:27   US Carotid Bilateral  11/28/2014   CLINICAL DATA:  Syncope.  EXAM: BILATERAL CAROTID DUPLEX ULTRASOUND  TECHNIQUE: Pearline Cables scale imaging, color Doppler and duplex ultrasound were performed of bilateral carotid and vertebral arteries in the neck.  COMPARISON:  None.  FINDINGS: Criteria: Quantification of carotid stenosis is based on velocity parameters that correlate the residual internal carotid diameter with NASCET-based stenosis levels, using the diameter of the distal internal carotid lumen as the denominator for stenosis measurement.  The following velocity measurements were obtained:  RIGHT  ICA:  85/23 cm/sec  CCA:  71/2 cm/sec  SYSTOLIC ICA/CCA RATIO:  1.0  DIASTOLIC ICA/CCA RATIO:  3.4  ECA:  54 cm/sec  LEFT  ICA:  113/13 cm/sec  CCA:   45/8 cm/sec  SYSTOLIC ICA/CCA RATIO:  1.6  DIASTOLIC ICA/CCA RATIO:  1.5  ECA:  107 cm/sec  RIGHT CAROTID ARTERY: Mild multifocal atherosclerotic plaque right carotid bifurcation. No flow limiting stenosis. Waveforms unremarkable.  RIGHT VERTEBRAL ARTERY: Patent with antegrade flow. Flow appears diminished.  LEFT CAROTID ARTERY: Mild multifocal atherosclerotic plaque left carotid bifurcation. No flow limiting stenosis. Waveforms unremarkable.  LEFT VERTEBRAL ARTERY: Patent with antegrade flow. Elevated flow velocities in the left vertebral.  IMPRESSION: 1. Mild multifocal atherosclerotic vascular plaque both carotid bifurcations. No flow limiting stenosis. Degree of stenosis less than 50%. 2. Vertebral arteries are patent with antegrade flow. Antegrade flow is diminished in the right vertebral artery. Elevated flow velocities noted in the left vertebral artery . Findings suggestive the presence of bilateral subclavian and/or vertebral artery disease.   Electronically Signed   By: [place]   On: 11/28/2014 12:11   Dg Chest Port 1 View  12/07/2014   CLINICAL DATA:  Hypoxia.  End-stage renal disease on dialysis.  EXAM: PORTABLE CHEST - 1 VIEW  COMPARISON:  06/08/2013  FINDINGS: The patient is rotated to the right, partially limiting evaluation of the mediastinum. Cardiomediastinal silhouette is grossly unchanged, again with evidence of mild cardiomegaly and aortic tortuosity. There is new, mild pulmonary vascular congestion with indistinctness of the pulmonary vasculature and mild bilateral  lung opacities which are greatest in the perihilar regions. No pleural effusion or pneumothorax is identified. No acute osseous abnormality is seen.  IMPRESSION: Mild cardiomegaly with new pulmonary vascular congestion and bilateral lung opacities suggestive of mild edema.   Electronically Signed   By: Logan Bores M.D.   On: 12/07/2014 14:09     CBC  Recent Labs Lab 12/05/14 1946 12/06/14 0452 12/08/14 0518  WBC  7.2 5.9 7.1  HGB 9.7* 9.5* 10.1*  HCT 28.8* 28.5* 30.6*  PLT 227 209 228  MCV 79.3 78.9 79.9  MCH 26.7 26.3 26.4  MCHC 33.7 33.3 33.0  RDW 19.8* 20.0* 20.1*  LYMPHSABS 1.0  --   --   MONOABS 0.9  --   --   EOSABS 0.6  --   --   BASOSABS 0.0  --   --     Chemistries   Recent Labs Lab 12/05/14 1946 12/06/14 0452 12/08/14 0518  NA 135 136 137  K 3.9 4.2 4.3  CL 92* 93* 95*  CO2 27 26 28   GLUCOSE 112* 81 106*  BUN 27* 30* 23*  CREATININE 6.52* 7.39* 6.25*  CALCIUM 9.0 8.8* 8.8*  AST 31 25  --   ALT 18 16*  --   ALKPHOS 93 87  --   BILITOT 1.6* 1.4*  --    ------------------------------------------------------------------------------------------------------------------ estimated creatinine clearance is 7 mL/min (by C-G formula based on Cr of 6.25). ------------------------------------------------------------------------------------------------------------------ No results for input(s): HGBA1C in the last 72 hours. ------------------------------------------------------------------------------------------------------------------ No results for input(s): CHOL, HDL, LDLCALC, TRIG, CHOLHDL, LDLDIRECT in the last 72 hours. ------------------------------------------------------------------------------------------------------------------ No results for input(s): TSH, T4TOTAL, T3FREE, THYROIDAB in the last 72 hours.  Invalid input(s): FREET3 ------------------------------------------------------------------------------------------------------------------ No results for input(s): VITAMINB12, FOLATE, FERRITIN, TIBC, IRON, RETICCTPCT in the last 72 hours.  Coagulation profile  Recent Labs Lab 12/06/14 0452  INR 1.28    No results for input(s): DDIMER in the last 72 hours.  Cardiac Enzymes No results for input(s): CKMB, TROPONINI, MYOGLOBIN in the last 168 hours.  Invalid input(s):  CK ------------------------------------------------------------------------------------------------------------------ Invalid input(s): POCBNP     Time Spent in minutes   35 minutes   ELGERGAWY, DAWOOD M.D on 12/08/2014 at 12:21 PM  Between 7am to 7pm - Pager - [phone]  After 7pm go to [email or web address] - password [facility]  [facility]   Office  [phone]

## 2014-12-08 NOTE — Progress Notes (Signed)
Subjective:  Some sob this am but better with O2 on ,did have hd  yest  On schedule, less bone pain  Objective Vital signs in last 24 hours: Filed Vitals:   12/07/14 1134 12/07/14 1727 12/07/14 2056 12/08/14 0520  BP:  148/100 134/75 114/65  Pulse:  88 83 74  Temp:  98.7 F (37.1 C) 98.7 F (37.1 C) 98 F (36.7 C)  TempSrc:  Oral Oral Oral  Resp:  18 18 14   Height:      Weight:   48.958 kg (107 lb 14.9 oz)   SpO2: 92% 99% 100% 97%   Weight change: -2.1 kg (-4 lb 10.1 oz)  Physical Exam: General: NAD, pleasant elderly AAM  Lungs: Crackles and  Rales R sided  Breathing is unlabored  On [place] O2 . Heart: RRR, soft 1/6 sem lsb, no rub or gallop Abdomen: bowel sounds +, soft, non-tender, non-distended  .  Extremities: no pedal  edema , IV in left foot, s/p right arm amputation Dialysis Access: left AVGG lower + bruit  OP Dialysis Orders:  MWF @ [facility] 180  3hr 45 mins 53kgs 2K/2Ca+ NO heparin L AVG [place] 50 8/24 Calcitriol 0.5 venofer 50 /week 8/24 Hgb 9.7 drifting down from 10.7 8/17 and 11.6 8/10 iPTH 233 - last Ca 10.5 and P 3.4  tsat 45% August - no ferritin since last October- need to order at d/c  Problem/Plan: 1. Volume overload/pulm edema - on cxr after HD yest  Needing [place] O2  This am =  Extra hd for uf tody  lower edw  With decr po intake sec to osteom. 2. L4-5 osteomyelitis - on  antibiotics.- ortho/ ID  Following  Volume overload/pulm edema - on cxr after HD yest  Needing [place] o2  Extra hd for uf tody  loer edw  With decr Metop.200 mg hs  po intake sec to osteom. 3. ESRD - MWF - HD today sec t vol issues - he is on no heparin HD 4. Hypertension/volume -BP ^ as an outpt - 114/65 this am / hold  losartin today, decr Amlodipine  10mg  to 5 mg hs and decr metop 200mg  to 100mg  xl hs will have ower edw as tolerated  With bp  5. Anemia - Hgb 9.7>10.1 consistent with outpt Hgb - continue ESA (start Aranesp 60 8/31) and weekly Fe  On hd  6. 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 Jimmy Footman - hold calcitriol for now - fu am ca phos , decr  phoslo 2 to 1 ac 7. Nutrition -Alb 2.9 >2.6-renal diet + supplements/vit when eating 8. Balanitis/penile ulceration - per primary   Ernest Haber, PA-C [place] [id number] 12/08/2014,8:51 AM  LOS: 3 days   Labs: Basic Metabolic Panel:  Recent Labs Lab 12/05/14 1946 12/06/14 0452 12/08/14 0518  NA 135 136 137  K 3.9 4.2 4.3  CL 92* 93* 95*  CO2 27 26 28   GLUCOSE 112* 81 106*  BUN 27* 30* 23*  CREATININE 6.52* 7.39* 6.25*  CALCIUM 9.0 8.8* 8.8*   Liver Function Tests:  Recent Labs Lab 12/05/14 1946 12/06/14 0452  AST 31 25  ALT 18 16*  ALKPHOS 93 87  BILITOT 1.6* 1.4*  PROT 6.9 7.1  ALBUMIN 2.9* 2.6*   No results for input(s): LIPASE, AMYLASE in the last 168 hours. No results for input(s): AMMONIA in the last 168 hours. CBC:  Recent Labs  Lab 12/05/14 1946 12/06/14 0452 12/08/14 0518  WBC 7.2 5.9 7.1  NEUTROABS 4.6  --   --   HGB 9.7* 9.5* 10.1*  HCT 28.8* 28.5* 30.6*  MCV 79.3 78.9 79.9  PLT 227 209 228   Cardiac Enzymes: No results for input(s): CKTOTAL, CKMB, CKMBINDEX, TROPONINI in the last 168 hours. CBG: No results for input(s): GLUCAP in the last 168 hours.  Studies/Results: Dg Chest Port 1 View  12/07/2014   CLINICAL DATA:  Hypoxia.  End-stage renal disease on dialysis.  EXAM: PORTABLE CHEST - 1 VIEW  COMPARISON:  06/08/2013  FINDINGS: The patient is rotated to the right, partially limiting evaluation of the mediastinum. Cardiomediastinal silhouette is grossly unchanged, again with evidence of mild cardiomegaly and aortic tortuosity. There is new, mild pulmonary vascular congestion with indistinctness of the pulmonary vasculature and mild bilateral lung opacities which are greatest in the perihilar regions. No pleural effusion or pneumothorax is identified. No acute osseous abnormality is seen.   IMPRESSION: Mild cardiomegaly with new pulmonary vascular congestion and bilateral lung opacities suggestive of mild edema.   Electronically Signed   By: Logan Bores M.D.   On: 12/07/2014 14:09   Medications:   . amLODipine  10 mg Oral QHS  . antiseptic oral rinse  7 mL Mouth Rinse q12n4p  . atorvastatin  40 mg Oral Daily  . calcium acetate  1,334 mg Oral TID WC  . cefTAZidime (FORTAZ)  IV  1 g Intravenous Q24H  . chlorhexidine  15 mL Mouth Rinse BID  . cinacalcet  60 mg Oral Q breakfast  . [START ON 12/11/2014] colchicine  0.3 mg Oral Once per day on Mon Thu  . [START ON 12/13/2014] darbepoetin (ARANESP) injection - DIALYSIS  60 mcg Intravenous Q Wed-HD  . feeding supplement (NEPRO CARB STEADY)  237 mL Oral BID BM  . heparin subcutaneous  5,000 Units Subcutaneous 3 times per day  . hydrALAZINE  50 mg Oral BID  . hydrocortisone cream   Topical BID  . losartan  25 mg Oral Daily  . metoprolol  200 mg Oral QHS  . mirtazapine  7.5 mg Oral Daily  . pantoprazole  40 mg Oral Daily  . sodium chloride  3 mL Intravenous Q12H

## 2014-12-08 NOTE — Progress Notes (Signed)
INFECTIOUS DISEASE PROGRESS NOTE  ID: DEVLON DOSHER is a 79 y.o. male with  Principal Problem:   Osteomyelitis Active Problems:   HYPERTENSION, BENIGN SYSTEMIC   ESRD on dialysis   Anemia of chronic renal failure, stage 5   Protein-calorie malnutrition, severe   Discitis  Subjective: Having back pain, radiating into his legs.   Abtx:  Anti-infectives    Start     Dose/Rate Route Frequency Ordered Stop   12/08/14 1300  vancomycin (VANCOCIN) 500 mg in sodium chloride 0.9 % 100 mL IVPB     500 mg 100 mL/hr over 60 Minutes Intravenous  Once 12/08/14 1047     12/07/14 2000  cefTAZidime (FORTAZ) 1 g in dextrose 5 % 50 mL IVPB     1 g 100 mL/hr over 30 Minutes Intravenous Every 24 hours 12/07/14 0915     12/07/14 1200  vancomycin (VANCOCIN) 500 mg in sodium chloride 0.9 % 100 mL IVPB     500 mg 100 mL/hr over 60 Minutes Intravenous Every Thu (Hemodialysis) 12/06/14 1834 12/07/14 1116   12/07/14 1200  cefTAZidime (FORTAZ) 2 g in dextrose 5 % 50 mL IVPB  Status:  Discontinued     2 g 100 mL/hr over 30 Minutes Intravenous Every Thu (Hemodialysis) 12/06/14 1834 12/07/14 0915   12/06/14 1900  vancomycin (VANCOCIN) 1,250 mg in sodium chloride 0.9 % 250 mL IVPB     1,250 mg 166.7 mL/hr over 90 Minutes Intravenous  Once 12/06/14 1834 12/07/14 0141   12/06/14 1900  cefTAZidime (FORTAZ) 2 g in dextrose 5 % 50 mL IVPB     2 g 100 mL/hr over 30 Minutes Intravenous  Once 12/06/14 1834 12/06/14 2123      Medications:  Scheduled: . amLODipine  5 mg Oral QHS  . antiseptic oral rinse  7 mL Mouth Rinse q12n4p  . atorvastatin  40 mg Oral Daily  . calcium acetate  1,334 mg Oral TID WC  . cefTAZidime (FORTAZ)  IV  1 g Intravenous Q24H  . chlorhexidine  15 mL Mouth Rinse BID  . cinacalcet  60 mg Oral Q breakfast  . [START ON 12/11/2014] colchicine  0.3 mg Oral Once per day on Mon Thu  . [START ON 12/13/2014] darbepoetin (ARANESP) injection - DIALYSIS  60 mcg Intravenous Q Wed-HD  . feeding  supplement (NEPRO CARB STEADY)  237 mL Oral BID BM  . heparin subcutaneous  5,000 Units Subcutaneous 3 times per day  . hydrALAZINE  50 mg Oral BID  . hydrocortisone cream   Topical BID  . [START ON 12/09/2014] losartan  25 mg Oral Daily  . metoprolol  100 mg Oral QHS  . mirtazapine  7.5 mg Oral Daily  . pantoprazole  40 mg Oral Daily  . sodium chloride  3 mL Intravenous Q12H  . vancomycin  500 mg Intravenous Once    Objective: Vital signs in last 24 hours: Temp:  [98 F (36.7 C)-98.7 F (37.1 C)] 98.1 F (36.7 C) (09/02 0931) Pulse Rate:  [74-88] 82 (09/02 0931) Resp:  [14-18] 16 (09/02 0931) BP: (114-148)/(55-100) 120/55 mmHg (09/02 0931) SpO2:  [97 %-100 %] 98 % (09/02 0931) Weight:  [48.958 kg (107 lb 14.9 oz)] 48.958 kg (107 lb 14.9 oz) (09/01 2056)   General appearance: alert, cooperative and no distress Resp: clear to auscultation bilaterally Cardio: regular rate and rhythm GI: normal findings: bowel sounds normal and soft, non-tender  Lab Results  Recent Labs  12/06/14 0452 12/08/14 0518  WBC 5.9 7.1  HGB 9.5* 10.1*  HCT 28.5* 30.6*  NA 136 137  K 4.2 4.3  CL 93* 95*  CO2 26 28  BUN 30* 23*  CREATININE 7.39* 6.25*   Liver Panel  Recent Labs  12/05/14 1946 12/06/14 0452  PROT 6.9 7.1  ALBUMIN 2.9* 2.6*  AST 31 25  ALT 18 16*  ALKPHOS 93 87  BILITOT 1.6* 1.4*   Sedimentation Rate  Recent Labs  12/05/14 1946  ESRSEDRATE 87*   C-Reactive Protein  Recent Labs  12/05/14 1946  CRP 19.9*    Microbiology: Recent Results (from the past 240 hour(s))  MRSA PCR Screening     Status: None   Collection Time: 12/06/14 12:59 AM  Result Value Ref Range Status   MRSA by PCR NEGATIVE NEGATIVE Final    Comment:        The GeneXpert MRSA Assay (FDA approved for NASAL specimens only), is one component of a comprehensive MRSA colonization surveillance program. It is not intended to diagnose MRSA infection nor to guide or monitor treatment  for MRSA infections.   Fungus Culture with Smear     Status: None (Preliminary result)   Collection Time: 12/06/14  1:51 PM  Result Value Ref Range Status   Specimen Description WOUND BACK  Final   Special Requests 5 DISC SPACE L4  Final   Fungal Smear   Final    NO YEAST OR FUNGAL ELEMENTS SEEN Performed at [facility]    Culture   Final    CULTURE IN PROGRESS FOR FOUR WEEKS Performed at [facility]    Report Status PENDING  Incomplete  AFB culture with smear     Status: None (Preliminary result)   Collection Time: 12/06/14  1:51 PM  Result Value Ref Range Status   Specimen Description WOUND BACK  Final   Special Requests 5 DISC SPACE L4  Final   Acid Fast Smear   Final    NO ACID FAST BACILLI SEEN Performed at [facility]    Culture   Final    CULTURE WILL BE EXAMINED FOR 6 WEEKS BEFORE ISSUING A FINAL REPORT Performed at [facility]    Report Status PENDING  Incomplete  Wound culture     Status: None (Preliminary result)   Collection Time: 12/06/14  1:51 PM  Result Value Ref Range Status   Specimen Description WOUND BACK  Final   Special Requests 5 DISC SPACE L4  Final   Gram Stain   Final    RARE WBC PRESENT,BOTH PMN AND MONONUCLEAR NO SQUAMOUS EPITHELIAL CELLS SEEN NO ORGANISMS SEEN Performed at [facility]    Culture   Final    NO GROWTH 1 DAY Performed at [facility]    Report Status PENDING  Incomplete  Culture, blood (routine x 2)     Status: None (Preliminary result)   Collection Time: 12/06/14  3:55 PM  Result Value Ref Range Status   Specimen Description BLOOD LEFT FOOT  Final   Special Requests BOTTLES DRAWN AEROBIC ONLY  5CC  Final   Culture NO GROWTH < 24 HOURS  Final   Report Status PENDING  Incomplete  Culture, blood (routine x 2)     Status: None (Preliminary result)   Collection Time: 12/06/14  4:02 PM  Result Value Ref Range Status   Specimen Description BLOOD RIGHT FOOT  Final    Special Requests BOTTLES DRAWN AEROBIC ONLY  5CC  Final  Culture NO GROWTH < 24 HOURS  Final   Report Status PENDING  Incomplete    Studies/Results: Dg Chest Port 1 View  12/07/2014   CLINICAL DATA:  Hypoxia.  End-stage renal disease on dialysis.  EXAM: PORTABLE CHEST - 1 VIEW  COMPARISON:  06/08/2013  FINDINGS: The patient is rotated to the right, partially limiting evaluation of the mediastinum. Cardiomediastinal silhouette is grossly unchanged, again with evidence of mild cardiomegaly and aortic tortuosity. There is new, mild pulmonary vascular congestion with indistinctness of the pulmonary vasculature and mild bilateral lung opacities which are greatest in the perihilar regions. No pleural effusion or pneumothorax is identified. No acute osseous abnormality is seen.  IMPRESSION: Mild cardiomegaly with new pulmonary vascular congestion and bilateral lung opacities suggestive of mild edema.   Electronically Signed   By: Logan Bores M.D.   On: 12/07/2014 14:09     Assessment/Plan: Osteomyelitis lumbar Spine Await his BCx Await his lumbar aspirate Cx He has no hx of TB exposure.  Continue his anbx  Total days of antibiotics: 2 vanco/ceftaz         Bobby Rumpf Infectious Diseases (pager) [phone] [email or web address] 12/08/2014, 12:23 PM  LOS: 3 days

## 2014-12-08 NOTE — Procedures (Signed)
I have seen and examined this patient and agree with the plan of care   [facility] W 12/08/2014, 2:27 PM

## 2014-12-08 NOTE — Care Management Important Message (Signed)
Important Message  Patient Details  Name: Brad [name] MRN: [id number] Date of Birth: Mar 31, 1936   Medicare Important Message Given:  Yes-second notification given    Delorse Lek 12/08/2014, 10:21 AM

## 2014-12-09 LAB — WOUND CULTURE: CULTURE: NO GROWTH

## 2014-12-09 MED ORDER — RENA-VITE PO TABS
1.0000 | ORAL_TABLET | Freq: Every day | ORAL | Status: DC
  Administered 2014-12-09 – 2014-12-10 (×2): 1 via ORAL
  Filled 2014-12-09 (×2): qty 1

## 2014-12-09 NOTE — Progress Notes (Signed)
Patient Demographics  Brad [name], is a 79 y.o. male, DOB - [birth year], PPI:[id number]  Admit date - 12/05/2014   Admitting Physician Lavina Hamman, MD  Outpatient Primary MD for the patient is No primary care provider on file.  LOS - 4   Chief Complaint  Patient presents with  . Back Pain         Subjective:   Brad [name] today has, No headache, No chest pain, No abdominal pain - No Nausea, No new weakness tingling or numbness, No Cough - dyspnea resolved.  Assessment & Plan    Principal Problem:   Osteomyelitis Active Problems:   HYPERTENSION, BENIGN SYSTEMIC   ESRD on dialysis   Anemia of chronic renal failure, stage 5   Protein-calorie malnutrition, severe   Discitis   Pressure ulcer  osteomyelitis/discitis of lumbar spine - As evident by MRI of lumbar spine done by orthopedic as an outpatient. - IR lumbar disc aspiration on 8/31. - on IV vancomycin and ceftazidime on 8/31 after blood cultures and aspiration has been done. - Follow on aspiration culture and blood culture (no growth to date), continue with current regimen of vancomycin and ceftazidime ending further culture results . - ID following  End-stage renal disease - Nephrology consulted  Acute hypoxic respiratory failure - Patient with significant dyspnea/hypoxia 9/1, evidence of volume overload on chest x-ray, required hemodialysis today's back-to-back 9/1, 9/2, significantly improved, denies any further dyspnea, will try to wean off oxygen,  Balanitis -  on topical hydrocortisone.  History of gout - Continue with home medication  Essential hypertension  - Labile, continue with home medication.    Code Status: Full  Family Communication: None at bedside, was called and left message  Disposition Plan: SNF once stable    Procedures  IR lumbar disc aspiration 12/06/14    Consults   Infectious  disease Nephrology Orthopedics   Medications  Scheduled Meds: . amLODipine  5 mg Oral QHS  . antiseptic oral rinse  7 mL Mouth Rinse q12n4p  . atorvastatin  40 mg Oral Daily  . calcium acetate  1,334 mg Oral TID WC  . cefTAZidime (FORTAZ)  IV  1 g Intravenous Q24H  . chlorhexidine  15 mL Mouth Rinse BID  . cinacalcet  60 mg Oral Q breakfast  . [START ON 12/11/2014] colchicine  0.3 mg Oral Once per day on Mon Thu  . [START ON 12/13/2014] darbepoetin (ARANESP) injection - DIALYSIS  60 mcg Intravenous Q Wed-HD  . feeding supplement (NEPRO CARB STEADY)  237 mL Oral BID BM  . heparin subcutaneous  5,000 Units Subcutaneous 3 times per day  . hydrALAZINE  50 mg Oral BID  . hydrocortisone cream   Topical BID  . losartan  25 mg Oral Daily  . metoprolol  100 mg Oral QHS  . mirtazapine  7.5 mg Oral Daily  . multivitamin  1 tablet Oral QHS  . pantoprazole  40 mg Oral Daily  . sodium chloride  3 mL Intravenous Q12H   Continuous Infusions:  PRN Meds:.acetaminophen **OR** acetaminophen, hydrALAZINE, ondansetron **OR** ondansetron (ZOFRAN) IV, oxyCODONE, traMADol  DVT Prophylaxis  - Heparin -   Lab Results  Component Value Date   PLT 228 12/08/2014    Antibiotics  Anti-infectives    Start     Dose/Rate Route Frequency Ordered Stop   12/08/14 1300  vancomycin (VANCOCIN) 500 mg in sodium chloride 0.9 % 100 mL IVPB     500 mg 100 mL/hr over 60 Minutes Intravenous  Once 12/08/14 1047 12/08/14 1307   12/07/14 2000  cefTAZidime (FORTAZ) 1 g in dextrose 5 % 50 mL IVPB     1 g 100 mL/hr over 30 Minutes Intravenous Every 24 hours 12/07/14 0915     12/07/14 1200  vancomycin (VANCOCIN) 500 mg in sodium chloride 0.9 % 100 mL IVPB     500 mg 100 mL/hr over 60 Minutes Intravenous Every Thu (Hemodialysis) 12/06/14 1834 12/07/14 1116   12/07/14 1200  cefTAZidime (FORTAZ) 2 g in dextrose 5 % 50 mL IVPB  Status:  Discontinued     2 g 100 mL/hr over 30 Minutes Intravenous Every Thu (Hemodialysis)  12/06/14 1834 12/07/14 0915   12/06/14 1900  vancomycin (VANCOCIN) 1,250 mg in sodium chloride 0.9 % 250 mL IVPB     1,250 mg 166.7 mL/hr over 90 Minutes Intravenous  Once 12/06/14 1834 12/07/14 0141   12/06/14 1900  cefTAZidime (FORTAZ) 2 g in dextrose 5 % 50 mL IVPB     2 g 100 mL/hr over 30 Minutes Intravenous  Once 12/06/14 1834 12/06/14 2123          Objective:   Filed Vitals:   12/08/14 1641 12/08/14 1647 12/08/14 2024 12/09/14 0506  BP: 148/76 151/69 151/88 150/72  Pulse: 75 77 83 72  Temp:  97.9 F (36.6 C) 99.1 F (37.3 C) 98.2 F (36.8 C)  TempSrc:  Oral    Resp: 18 18 18 17   Height:      Weight:  49.7 kg (109 lb 9.1 oz) 50.2 kg (110 lb 10.7 oz)   SpO2:   100% 100%    Wt Readings from Last 3 Encounters:  12/08/14 50.2 kg (110 lb 10.7 oz)  11/25/14 53.978 kg (119 lb)  08/18/14 52.8 kg (116 lb 6.5 oz)     Intake/Output Summary (Last 24 hours) at 12/09/14 1133 Last data filed at 12/09/14 0837  Gross per 24 hour  Intake    680 ml  Output      0 ml  Net    680 ml     Physical Exam  Awake Alert, Oriented X 3,  [place].AT,PERRAL Supple Neck,No JVD, No cervical lymphadenopathy appriciated.  Symmetrical Chest wall movement, Good air movement bilaterally,  RRR,No Gallops,Rubs or new Murmurs, No Parasternal Heave +ve B.Sounds, Abd Soft, No tenderness, No organomegaly appriciated, No Cyanosis, Clubbing or edema, right upper extremity amputated.    Data Review   Micro Results Recent Results (from the past 240 hour(s))  MRSA PCR Screening     Status: None   Collection Time: 12/06/14 12:59 AM  Result Value Ref Range Status   MRSA by PCR NEGATIVE NEGATIVE Final    Comment:        The GeneXpert MRSA Assay (FDA approved for NASAL specimens only), is one component of a comprehensive MRSA colonization surveillance program. It is not intended to diagnose MRSA infection nor to guide or monitor treatment for MRSA infections.   Fungus Culture with Smear      Status: None (Preliminary result)   Collection Time: 12/06/14  1:51 PM  Result Value Ref Range Status   Specimen Description WOUND BACK  Final   Special Requests 5 DISC SPACE L4  Final   Fungal Smear  Final    NO YEAST OR FUNGAL ELEMENTS SEEN Performed at [facility]    Culture   Final    CULTURE IN PROGRESS FOR FOUR WEEKS Performed at [facility]    Report Status PENDING  Incomplete  AFB culture with smear     Status: None (Preliminary result)   Collection Time: 12/06/14  1:51 PM  Result Value Ref Range Status   Specimen Description WOUND BACK  Final   Special Requests 5 DISC SPACE L4  Final   Acid Fast Smear   Final    NO ACID FAST BACILLI SEEN Performed at [facility]    Culture   Final    CULTURE WILL BE EXAMINED FOR 6 WEEKS BEFORE ISSUING A FINAL REPORT Performed at [facility]    Report Status PENDING  Incomplete  Wound culture     Status: None   Collection Time: 12/06/14  1:51 PM  Result Value Ref Range Status   Specimen Description WOUND BACK  Final   Special Requests 5 DISC SPACE L4  Final   Gram Stain   Final    RARE WBC PRESENT,BOTH PMN AND MONONUCLEAR NO SQUAMOUS EPITHELIAL CELLS SEEN NO ORGANISMS SEEN Performed at [facility]    Culture   Final    NO GROWTH 2 DAYS Performed at [facility]    Report Status 12/09/2014 FINAL  Final  Culture, blood (routine x 2)     Status: None (Preliminary result)   Collection Time: 12/06/14  3:55 PM  Result Value Ref Range Status   Specimen Description BLOOD LEFT FOOT  Final   Special Requests BOTTLES DRAWN AEROBIC ONLY  5CC  Final   Culture NO GROWTH 3 DAYS  Final   Report Status PENDING  Incomplete  Culture, blood (routine x 2)     Status: None (Preliminary result)   Collection Time: 12/06/14  4:02 PM  Result Value Ref Range Status   Specimen Description BLOOD RIGHT FOOT  Final   Special Requests BOTTLES DRAWN AEROBIC ONLY  5CC  Final   Culture NO GROWTH 3  DAYS  Final   Report Status PENDING  Incomplete    Radiology Reports Mr Lumbar Spine Wo Contrast  12/05/2014   CLINICAL DATA:  Low back pain with left hip pain, worsening for 3 months. Chronic renal failure on dialysis. History of gout.  EXAM: MRI LUMBAR SPINE WITHOUT CONTRAST  TECHNIQUE: Multiplanar, multisequence MR imaging of the lumbar spine was performed. No intravenous contrast was administered.  COMPARISON:  Right hip MRI 06/08/2013. Right hip radiographs 06/04/2013. No prior lumbar spine imaging.  FINDINGS: There is straightening of the normal lumbar lordosis. There is no significant listhesis. Disc desiccation is present in the visualized lower thoracic spine and from L1-2 to L3-4. Multiple Schmorl's nodes are noted, including a prominent 1 involving the L3 superior endplate. There is no evidence of compression fracture.  There is marked disc space height loss at L4-5 with abnormal STIR hyperintensity in the disc space and erosive changes of the endplates. Prominent marrow edema is present throughout the L4 and L5 vertebral bodies with some extension into the pedicles. Marrow edema was present subjacent to the L5 superior endplate on the prior hip MRI but has progressed in the interim. Disc space collapse has progressed since the prior hip radiographs. There is circumferential displacement of low T2 signal material from the disc space. No paraspinal fluid collection or epidural abscess is seen.  The conus medullaris  is normal in signal and terminates at T12. The kidneys are atrophic and contain numerous cysts, incompletely evaluated.  T12-L1:  Minimal disc bulging without stenosis.  L1-2:  Minimal disc bulging without stenosis.  L2-3:  Mild disc bulging without stenosis.  L3-4: Mild-to-moderate circumferential disc bulging and moderate facet hypertrophy result in mild spinal stenosis, moderate bilateral lateral recess stenosis, and mild bilateral neural foraminal stenosis.  L4-5: Circumferentially  displaced material from the disc space, centrally extruded disc material behind the L4 vertebral body, and severe facet hypertrophy result in mild spinal stenosis, severe bilateral lateral recess stenosis, and severe bilateral neural foraminal stenosis. A small right facet joint effusion is noted.  L5-S1: Mild disc bulging and facet hypertrophy result in mild bilateral lateral recess narrowing without spinal canal or neural foraminal stenosis.  IMPRESSION: 1. Destructive process centered at the L4-5 disc space which has progressed from hip imaging performed in 06/2013 and is concerning for infectious discitis/osteomyelitis. Displaced disc material contributes to mild spinal stenosis and severe bilateral neural foraminal and lateral recess stenosis. No evidence of epidural or paravertebral abscess. Other potential etiologies, such as hemodialysis spondyloarthropathy and gout, are possible but not favored. 2. Mild-to-moderate disc and facet degeneration elsewhere as above. These results will be called to the ordering clinician or representative by the Radiologist Assistant, and communication documented in the PACS or zVision Dashboard.   Electronically Signed   By: Logan Bores M.D.   On: 12/05/2014 13:27   US Carotid Bilateral  11/28/2014   CLINICAL DATA:  Syncope.  EXAM: BILATERAL CAROTID DUPLEX ULTRASOUND  TECHNIQUE: Pearline Cables scale imaging, color Doppler and duplex ultrasound were performed of bilateral carotid and vertebral arteries in the neck.  COMPARISON:  None.  FINDINGS: Criteria: Quantification of carotid stenosis is based on velocity parameters that correlate the residual internal carotid diameter with NASCET-based stenosis levels, using the diameter of the distal internal carotid lumen as the denominator for stenosis measurement.  The following velocity measurements were obtained:  RIGHT  ICA:  85/23 cm/sec  CCA:  43/1 cm/sec  SYSTOLIC ICA/CCA RATIO:  1.0  DIASTOLIC ICA/CCA RATIO:  3.4  ECA:  54 cm/sec   LEFT  ICA:  113/13 cm/sec  CCA:  54/0 cm/sec  SYSTOLIC ICA/CCA RATIO:  1.6  DIASTOLIC ICA/CCA RATIO:  1.5  ECA:  107 cm/sec  RIGHT CAROTID ARTERY: Mild multifocal atherosclerotic plaque right carotid bifurcation. No flow limiting stenosis. Waveforms unremarkable.  RIGHT VERTEBRAL ARTERY: Patent with antegrade flow. Flow appears diminished.  LEFT CAROTID ARTERY: Mild multifocal atherosclerotic plaque left carotid bifurcation. No flow limiting stenosis. Waveforms unremarkable.  LEFT VERTEBRAL ARTERY: Patent with antegrade flow. Elevated flow velocities in the left vertebral.  IMPRESSION: 1. Mild multifocal atherosclerotic vascular plaque both carotid bifurcations. No flow limiting stenosis. Degree of stenosis less than 50%. 2. Vertebral arteries are patent with antegrade flow. Antegrade flow is diminished in the right vertebral artery. Elevated flow velocities noted in the left vertebral artery . Findings suggestive the presence of bilateral subclavian and/or vertebral artery disease.   Electronically Signed   By: [place]   On: 11/28/2014 12:11   Dg Chest Port 1 View  12/07/2014   CLINICAL DATA:  Hypoxia.  End-stage renal disease on dialysis.  EXAM: PORTABLE CHEST - 1 VIEW  COMPARISON:  06/08/2013  FINDINGS: The patient is rotated to the right, partially limiting evaluation of the mediastinum. Cardiomediastinal silhouette is grossly unchanged, again with evidence of mild cardiomegaly and aortic tortuosity. There is new, mild pulmonary vascular congestion  with indistinctness of the pulmonary vasculature and mild bilateral lung opacities which are greatest in the perihilar regions. No pleural effusion or pneumothorax is identified. No acute osseous abnormality is seen.  IMPRESSION: Mild cardiomegaly with new pulmonary vascular congestion and bilateral lung opacities suggestive of mild edema.   Electronically Signed   By: Logan Bores M.D.   On: 12/07/2014 14:09     CBC  Recent Labs Lab 12/05/14 1946  12/06/14 0452 12/08/14 0518  WBC 7.2 5.9 7.1  HGB 9.7* 9.5* 10.1*  HCT 28.8* 28.5* 30.6*  PLT 227 209 228  MCV 79.3 78.9 79.9  MCH 26.7 26.3 26.4  MCHC 33.7 33.3 33.0  RDW 19.8* 20.0* 20.1*  LYMPHSABS 1.0  --   --   MONOABS 0.9  --   --   EOSABS 0.6  --   --   BASOSABS 0.0  --   --     Chemistries   Recent Labs Lab 12/05/14 1946 12/06/14 0452 12/08/14 0518  NA 135 136 137  K 3.9 4.2 4.3  CL 92* 93* 95*  CO2 27 26 28   GLUCOSE 112* 81 106*  BUN 27* 30* 23*  CREATININE 6.52* 7.39* 6.25*  CALCIUM 9.0 8.8* 8.8*  AST 31 25  --   ALT 18 16*  --   ALKPHOS 93 87  --   BILITOT 1.6* 1.4*  --    ------------------------------------------------------------------------------------------------------------------ estimated creatinine clearance is 7.1 mL/min (by C-G formula based on Cr of 6.25). ------------------------------------------------------------------------------------------------------------------ No results for input(s): HGBA1C in the last 72 hours. ------------------------------------------------------------------------------------------------------------------ No results for input(s): CHOL, HDL, LDLCALC, TRIG, CHOLHDL, LDLDIRECT in the last 72 hours. ------------------------------------------------------------------------------------------------------------------ No results for input(s): TSH, T4TOTAL, T3FREE, THYROIDAB in the last 72 hours.  Invalid input(s): FREET3 ------------------------------------------------------------------------------------------------------------------ No results for input(s): VITAMINB12, FOLATE, FERRITIN, TIBC, IRON, RETICCTPCT in the last 72 hours.  Coagulation profile  Recent Labs Lab 12/06/14 0452  INR 1.28    No results for input(s): DDIMER in the last 72 hours.  Cardiac Enzymes No results for input(s): CKMB, TROPONINI, MYOGLOBIN in the last 168 hours.  Invalid input(s):  CK ------------------------------------------------------------------------------------------------------------------ Invalid input(s): POCBNP     Time Spent in minutes   30 minutes   Pawel Soules M.D on 12/09/2014 at 11:33 AM  Between 7am to 7pm - Pager - [phone]  After 7pm go to [email or web address] - password [email or web address]  [facility]   Office  [phone]

## 2014-12-09 NOTE — Progress Notes (Signed)
ANTIBIOTIC CONSULT NOTE - FOLLOW UP  Pharmacy Consult for Vancomycin + Ceftazidime Indication: L4-5 osteo/diskitis  No Known Allergies  Patient Measurements: Height: 5\' 11"  (180.3 cm) Weight: 110 lb 10.7 oz (50.2 kg) IBW/kg (Calculated) : 75.3  Vital Signs: Temp: 98.2 F (36.8 C) (09/03 0506) BP: 150/72 mmHg (09/03 0506) Pulse Rate: 72 (09/03 0506) Intake/Output from previous day: 09/02 0701 - 09/03 0700 In: 950 [P.O.:850; IV Piggyback:100] Out: 0  Intake/Output from this shift: Total I/O In: 220 [P.O.:220] Out: 0   Labs:  Recent Labs  12/08/14 0518  WBC 7.1  HGB 10.1*  PLT 228  CREATININE 6.25*   Estimated Creatinine Clearance: 7.1 mL/min (by C-G formula based on Cr of 6.25). No results for input(s): VANCOTROUGH, VANCOPEAK, VANCORANDOM, GENTTROUGH, GENTPEAK, GENTRANDOM, TOBRATROUGH, TOBRAPEAK, TOBRARND, AMIKACINPEAK, AMIKACINTROU, AMIKACIN in the last 72 hours.   Microbiology: Recent Results (from the past 720 hour(s))  MRSA PCR Screening     Status: None   Collection Time: 12/06/14 12:59 AM  Result Value Ref Range Status   MRSA by PCR NEGATIVE NEGATIVE Final    Comment:        The GeneXpert MRSA Assay (FDA approved for NASAL specimens only), is one component of a comprehensive MRSA colonization surveillance program. It is not intended to diagnose MRSA infection nor to guide or monitor treatment for MRSA infections.   Fungus Culture with Smear     Status: None (Preliminary result)   Collection Time: 12/06/14  1:51 PM  Result Value Ref Range Status   Specimen Description WOUND BACK  Final   Special Requests 5 DISC SPACE L4  Final   Fungal Smear   Final    NO YEAST OR FUNGAL ELEMENTS SEEN Performed at [facility]    Culture   Final    CULTURE IN PROGRESS FOR FOUR WEEKS Performed at [facility]    Report Status PENDING  Incomplete  AFB culture with smear     Status: None (Preliminary result)   Collection Time: 12/06/14  1:51 PM   Result Value Ref Range Status   Specimen Description WOUND BACK  Final   Special Requests 5 DISC SPACE L4  Final   Acid Fast Smear   Final    NO ACID FAST BACILLI SEEN Performed at [facility]    Culture   Final    CULTURE WILL BE EXAMINED FOR 6 WEEKS BEFORE ISSUING A FINAL REPORT Performed at [facility]    Report Status PENDING  Incomplete  Wound culture     Status: None   Collection Time: 12/06/14  1:51 PM  Result Value Ref Range Status   Specimen Description WOUND BACK  Final   Special Requests 5 DISC SPACE L4  Final   Gram Stain   Final    RARE WBC PRESENT,BOTH PMN AND MONONUCLEAR NO SQUAMOUS EPITHELIAL CELLS SEEN NO ORGANISMS SEEN Performed at [facility]    Culture   Final    NO GROWTH 2 DAYS Performed at [facility]    Report Status 12/09/2014 FINAL  Final  Culture, blood (routine x 2)     Status: None (Preliminary result)   Collection Time: 12/06/14  3:55 PM  Result Value Ref Range Status   Specimen Description BLOOD LEFT FOOT  Final   Special Requests BOTTLES DRAWN AEROBIC ONLY  5CC  Final   Culture NO GROWTH 3 DAYS  Final   Report Status PENDING  Incomplete  Culture, blood (routine x 2)  Status: None (Preliminary result)   Collection Time: 12/06/14  4:02 PM  Result Value Ref Range Status   Specimen Description BLOOD RIGHT FOOT  Final   Special Requests BOTTLES DRAWN AEROBIC ONLY  5CC  Final   Culture NO GROWTH 3 DAYS  Final   Report Status PENDING  Incomplete    Anti-infectives    Start     Dose/Rate Route Frequency Ordered Stop   12/08/14 1300  vancomycin (VANCOCIN) 500 mg in sodium chloride 0.9 % 100 mL IVPB     500 mg 100 mL/hr over 60 Minutes Intravenous  Once 12/08/14 1047 12/08/14 1307   12/07/14 2000  cefTAZidime (FORTAZ) 1 g in dextrose 5 % 50 mL IVPB     1 g 100 mL/hr over 30 Minutes Intravenous Every 24 hours 12/07/14 0915     12/07/14 1200  vancomycin (VANCOCIN) 500 mg in sodium chloride 0.9 % 100 mL  IVPB     500 mg 100 mL/hr over 60 Minutes Intravenous Every Thu (Hemodialysis) 12/06/14 1834 12/07/14 1116   12/07/14 1200  cefTAZidime (FORTAZ) 2 g in dextrose 5 % 50 mL IVPB  Status:  Discontinued     2 g 100 mL/hr over 30 Minutes Intravenous Every Thu (Hemodialysis) 12/06/14 1834 12/07/14 0915   12/06/14 1900  vancomycin (VANCOCIN) 1,250 mg in sodium chloride 0.9 % 250 mL IVPB     1,250 mg 166.7 mL/hr over 90 Minutes Intravenous  Once 12/06/14 1834 12/07/14 0141   12/06/14 1900  cefTAZidime (FORTAZ) 2 g in dextrose 5 % 50 mL IVPB     2 g 100 mL/hr over 30 Minutes Intravenous  Once 12/06/14 1834 12/06/14 2123      Assessment: 37 YOM with ESRD admitted on 8/30 after MRI revealed L4-5 osteo/diskitis. The patient underwent aspiration and culturing by IR on 8/31 followed by the start of Vancomycin + Ceftazidime. The patient did not receive HD on 8/31 and was dialyzed off-schedule (normally a MWF schedule) on 9/1 AM (3.5 hr BFR 400) and received an appropriate Vancomycin maintenance dose after that HD session. Yesterday, patient was to go for an extra HD session d/t volume overload. A dose was entered for patient to be given after HD, however RN on the floor gave it prior to HD. HD session was only 2h with BFR 400. Calculate patient's current vancomycin level to be ~24-26.  Per renal notes, no plans for HD this weekend and will resume MWF schedule on 9/5.  WBC wnl, afeb. ID following. No growth on cultures.  Goal of Therapy:  Pre-HD Vancomycin level of 15-25 mcg/ml Proper antibiotics for infection/cultures adjusted for renal/hepatic function   Plan:  - will obtain a pre-HD vancomycin level prior to next session (anticipate 9/5) - will follow for HD schedule plans. NO STANDING VANCOMYCIN IS ORDERED AT THIS TIME - Ceftazidime 1g IV q24h until he is on a clear HD schedule -  Follow HD schedule/tolerance, c/s, LOT, and antibiotic de-escalation plans   Alik Mawson D. Blayton Huttner, PharmD, BCPS Clinical  Pharmacist Pager: [phone] 12/09/2014 11:44 AM

## 2014-12-09 NOTE — Progress Notes (Signed)
[facility] Progress Note  Assessment/Plan: 1. Volume overload/pulm edema -  Had HD Thursday and Friday (UF 1.9) - post weight 49.7 on Friday - sats ok, still needs more volume removed. 2. L4-5 osteomyelitis - on day 3 Vanc and Fortaz, awaiting cultures  3. ESRD - MWF -  no heparin HD - NEXT HD Monday 4. Hypertension/volume -BP ^ , titrating meds to allow volume removal  and lowering EDW for d/c 5. Anemia - Hgb 9.7>10.1 consistent with outpt Hgb - continue ESA (start Aranesp 60 8/31) and weekly Fe On hd  6. 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 Jimmy Footman - hold calcitriol for now - fu am ca phos , decr phoslo 2 to 1 ac 7. Nutrition -Alb 2.9 >2.6-renal diet + supplements/vit when eating 8. Balanitis/penile ulceration - per primary, topical hydrocortisone  Myriam Jacobson, PA-C [place] [phone] 12/09/2014,10:27 AM  LOS: 4 days   Subjective:  MD said I might need to go to a rehab facility. Back and legs still hurt variably  Objective Filed Vitals:   12/08/14 1641 12/08/14 1647 12/08/14 2024 12/09/14 0506  BP: 148/76 151/69 151/88 150/72  Pulse: 75 77 83 72  Temp:  97.9 F (36.6 C) 99.1 F (37.3 C) 98.2 F (36.8 C)  TempSrc:  Oral    Resp: 18 18 18 17   Height:      Weight:  49.7 kg (109 lb 9.1 oz) 50.2 kg (110 lb 10.7 oz)   SpO2:   100% 100%   Physical Exam General: emaciated, on O2 NAD  Heart: RRR Lungs: bilateral crackles Abdomen: scaphoid sot Extremities: no LE edema; s/p right UE amputation Dialysis Access: left AVGG  + bruit  Dialysis Orders: MWF @ [facility]  3hr 45 mins 53kgs 2K/2Ca+ NO heparin L AVG Micera 50 8/24 Calcitriol 0.5 venofer 50 /week 8/24 Hgb 9.7 drifting down from 10.7 8/17 and 11.6 8/10 iPTH 233 - last Ca 10.5 and P 3.4  tsat 45% August - no ferritin since last October- need to order at d/c  Additional Objective Labs: Basic  Metabolic Panel:  Recent Labs Lab 12/05/14 1946 12/06/14 0452 12/08/14 0518  NA 135 136 137  K 3.9 4.2 4.3  CL 92* 93* 95*  CO2 27 26 28   GLUCOSE 112* 81 106*  BUN 27* 30* 23*  CREATININE 6.52* 7.39* 6.25*  CALCIUM 9.0 8.8* 8.8*   Liver Function Tests:  Recent Labs Lab 12/05/14 1946 12/06/14 0452  AST 31 25  ALT 18 16*  ALKPHOS 93 87  BILITOT 1.6* 1.4*  PROT 6.9 7.1  ALBUMIN 2.9* 2.6*  CBC:  Recent Labs Lab 12/05/14 1946 12/06/14 0452 12/08/14 0518  WBC 7.2 5.9 7.1  NEUTROABS 4.6  --   --   HGB 9.7* 9.5* 10.1*  HCT 28.8* 28.5* 30.6*  MCV 79.3 78.9 79.9  PLT 227 209 228   Blood Culture    Component Value Date/Time   SDES BLOOD RIGHT FOOT 12/06/2014 1602   SPECREQUEST BOTTLES DRAWN AEROBIC ONLY  5CC 12/06/2014 1602   CULT NO GROWTH 2 DAYS 12/06/2014 1602   REPTSTATUS PENDING 12/06/2014 1602   Studies/Results: Dg Chest Port 1 View  12/07/2014   CLINICAL DATA:  Hypoxia.  End-stage renal disease on dialysis.  EXAM: PORTABLE CHEST - 1 VIEW  COMPARISON:  06/08/2013  FINDINGS: The patient is rotated to the right, partially limiting evaluation of the mediastinum. Cardiomediastinal  silhouette is grossly unchanged, again with evidence of mild cardiomegaly and aortic tortuosity. There is new, mild pulmonary vascular congestion with indistinctness of the pulmonary vasculature and mild bilateral lung opacities which are greatest in the perihilar regions. No pleural effusion or pneumothorax is identified. No acute osseous abnormality is seen.  IMPRESSION: Mild cardiomegaly with new pulmonary vascular congestion and bilateral lung opacities suggestive of mild edema.   Electronically Signed   By: Logan Bores M.D.   On: 12/07/2014 14:09   Medications:   . amLODipine  5 mg Oral QHS  . antiseptic oral rinse  7 mL Mouth Rinse q12n4p  . atorvastatin  40 mg Oral Daily  . calcium acetate  1,334 mg Oral TID WC  . cefTAZidime (FORTAZ)  IV  1 g Intravenous Q24H  . chlorhexidine  15  mL Mouth Rinse BID  . cinacalcet  60 mg Oral Q breakfast  . [START ON 12/11/2014] colchicine  0.3 mg Oral Once per day on Mon Thu  . [START ON 12/13/2014] darbepoetin (ARANESP) injection - DIALYSIS  60 mcg Intravenous Q Wed-HD  . feeding supplement (NEPRO CARB STEADY)  237 mL Oral BID BM  . heparin subcutaneous  5,000 Units Subcutaneous 3 times per day  . hydrALAZINE  50 mg Oral BID  . hydrocortisone cream   Topical BID  . losartan  25 mg Oral Daily  . metoprolol  100 mg Oral QHS  . mirtazapine  7.5 mg Oral Daily  . pantoprazole  40 mg Oral Daily  . sodium chloride  3 mL Intravenous Q12H

## 2014-12-10 MED ORDER — CEFAZOLIN SODIUM 1-5 GM-% IV SOLN
1.0000 g | INTRAVENOUS | Status: DC
  Administered 2014-12-10: 1 g via INTRAVENOUS
  Filled 2014-12-10 (×2): qty 50

## 2014-12-10 MED ORDER — HYDRALAZINE HCL 20 MG/ML IJ SOLN
5.0000 mg | Freq: Once | INTRAMUSCULAR | Status: AC
  Administered 2014-12-10: 5 mg via INTRAVENOUS
  Filled 2014-12-10: qty 1

## 2014-12-10 NOTE — Clinical Social Work Placement (Signed)
   CLINICAL SOCIAL WORK PLACEMENT  NOTE  Date:  12/10/2014  Patient Details  Name: Brad [name] MRN: [id number] Date of Birth: Mar 28, 1936  Clinical Social Work is seeking post-discharge placement for this patient at the [place] level of care (*CSW will initial, date and re-position this form in  chart as items are completed):  Yes   Patient/family provided with [facility] Work Department's list of facilities offering this level of care within the geographic area requested by the patient (or if unable, by the patient's family).  Yes   Patient/family informed of their freedom to choose among providers that offer the needed level of care, that participate in Medicare, Medicaid or managed care program needed by the patient, have an available bed and are willing to accept the patient.  Yes   Patient/family informed of [facility]'s ownership interest in [facility] and [facility], as well as of the fact that they are under no obligation to receive care at these facilities.  PASRR submitted to EDS on 12/10/14     PASRR number received on 12/10/14     Existing PASRR number confirmed on       FL2 transmitted to all facilities in geographic area requested by pt/family on 12/10/14     FL2 transmitted to all facilities within larger geographic area on       Patient informed that his/her managed care company has contracts with or will negotiate with certain facilities, including the following:            Patient/family informed of bed offers received.  Patient chooses bed at       Physician recommends and patient chooses bed at      Patient to be transferred to   on  .  Patient to be transferred to facility by       Patient family notified on   of transfer.  Name of family member notified:        PHYSICIAN       Additional Comment:    _______________________________________________ Christene Lye, LCSW 12/10/2014, 4:00 PM

## 2014-12-10 NOTE — Progress Notes (Signed)
BP 234/84 gave 5 mg IV hydralazine per MD order at 17:46. Repeat BP 227/80.  MD notified. Will continue to monitor.

## 2014-12-10 NOTE — Progress Notes (Signed)
ANTIBIOTIC CONSULT NOTE - FOLLOW UP  Pharmacy Consult for Vancomycin + Cefazolin Indication: L4-5 osteo/diskitis  No Known Allergies  Patient Measurements: Height: 5\' 11"  (180.3 cm) Weight: 113 lb 15.7 oz (51.7 kg) IBW/kg (Calculated) : 75.3  Vital Signs: Temp: 98.2 F (36.8 C) (09/04 1032) Temp Source: Oral (09/04 1032) BP: 155/88 mmHg (09/04 1032) Pulse Rate: 79 (09/04 1032) Intake/Output from previous day: 09/03 0701 - 09/04 0700 In: 1397 [P.O.:1397] Out: 0  Intake/Output from this shift: Total I/O In: 220 [P.O.:220] Out: 0   Labs:  Recent Labs  12/08/14 0518  WBC 7.1  HGB 10.1*  PLT 228  CREATININE 6.25*   Estimated Creatinine Clearance: 7.4 mL/min (by C-G formula based on Cr of 6.25). No results for input(s): VANCOTROUGH, VANCOPEAK, VANCORANDOM, GENTTROUGH, GENTPEAK, GENTRANDOM, TOBRATROUGH, TOBRAPEAK, TOBRARND, AMIKACINPEAK, AMIKACINTROU, AMIKACIN in the last 72 hours.   Microbiology: Recent Results (from the past 720 hour(s))  MRSA PCR Screening     Status: None   Collection Time: 12/06/14 12:59 AM  Result Value Ref Range Status   MRSA by PCR NEGATIVE NEGATIVE Final    Comment:        The GeneXpert MRSA Assay (FDA approved for NASAL specimens only), is one component of a comprehensive MRSA colonization surveillance program. It is not intended to diagnose MRSA infection nor to guide or monitor treatment for MRSA infections.   Fungus Culture with Smear     Status: None (Preliminary result)   Collection Time: 12/06/14  1:51 PM  Result Value Ref Range Status   Specimen Description WOUND BACK  Final   Special Requests 5 DISC SPACE L4  Final   Fungal Smear   Final    NO YEAST OR FUNGAL ELEMENTS SEEN Performed at [facility]    Culture   Final    CULTURE IN PROGRESS FOR FOUR WEEKS Performed at [facility]    Report Status PENDING  Incomplete  AFB culture with smear     Status: None (Preliminary result)   Collection Time:  12/06/14  1:51 PM  Result Value Ref Range Status   Specimen Description WOUND BACK  Final   Special Requests 5 DISC SPACE L4  Final   Acid Fast Smear   Final    NO ACID FAST BACILLI SEEN Performed at [facility]    Culture   Final    CULTURE WILL BE EXAMINED FOR 6 WEEKS BEFORE ISSUING A FINAL REPORT Performed at [facility]    Report Status PENDING  Incomplete  Wound culture     Status: None   Collection Time: 12/06/14  1:51 PM  Result Value Ref Range Status   Specimen Description WOUND BACK  Final   Special Requests 5 DISC SPACE L4  Final   Gram Stain   Final    RARE WBC PRESENT,BOTH PMN AND MONONUCLEAR NO SQUAMOUS EPITHELIAL CELLS SEEN NO ORGANISMS SEEN Performed at [facility]    Culture   Final    NO GROWTH 2 DAYS Performed at [facility]    Report Status 12/09/2014 FINAL  Final  Culture, blood (routine x 2)     Status: None (Preliminary result)   Collection Time: 12/06/14  3:55 PM  Result Value Ref Range Status   Specimen Description BLOOD LEFT FOOT  Final   Special Requests BOTTLES DRAWN AEROBIC ONLY  5CC  Final   Culture NO GROWTH 3 DAYS  Final   Report Status PENDING  Incomplete  Culture, blood (  routine x 2)     Status: None (Preliminary result)   Collection Time: 12/06/14  4:02 PM  Result Value Ref Range Status   Specimen Description BLOOD RIGHT FOOT  Final   Special Requests BOTTLES DRAWN AEROBIC ONLY  5CC  Final   Culture NO GROWTH 3 DAYS  Final   Report Status PENDING  Incomplete    Anti-infectives    Start     Dose/Rate Route Frequency Ordered Stop   12/10/14 1800  ceFAZolin (ANCEF) IVPB 1 g/50 mL premix     1 g 100 mL/hr over 30 Minutes Intravenous Every 24 hours 12/10/14 1155     12/08/14 1300  vancomycin (VANCOCIN) 500 mg in sodium chloride 0.9 % 100 mL IVPB     500 mg 100 mL/hr over 60 Minutes Intravenous  Once 12/08/14 1047 12/08/14 1307   12/07/14 2000  cefTAZidime (FORTAZ) 1 g in dextrose 5 % 50 mL IVPB   Status:  Discontinued     1 g 100 mL/hr over 30 Minutes Intravenous Every 24 hours 12/07/14 0915 12/10/14 1149   12/07/14 1200  vancomycin (VANCOCIN) 500 mg in sodium chloride 0.9 % 100 mL IVPB     500 mg 100 mL/hr over 60 Minutes Intravenous Every Thu (Hemodialysis) 12/06/14 1834 12/07/14 1116   12/07/14 1200  cefTAZidime (FORTAZ) 2 g in dextrose 5 % 50 mL IVPB  Status:  Discontinued     2 g 100 mL/hr over 30 Minutes Intravenous Every Thu (Hemodialysis) 12/06/14 1834 12/07/14 0915   12/06/14 1900  vancomycin (VANCOCIN) 1,250 mg in sodium chloride 0.9 % 250 mL IVPB     1,250 mg 166.7 mL/hr over 90 Minutes Intravenous  Once 12/06/14 1834 12/07/14 0141   12/06/14 1900  cefTAZidime (FORTAZ) 2 g in dextrose 5 % 50 mL IVPB     2 g 100 mL/hr over 30 Minutes Intravenous  Once 12/06/14 1834 12/06/14 2123      Assessment: 36 YOM with ESRD admitted on 8/30 after MRI revealed L4-5 osteo/diskitis. The patient underwent aspiration and culturing by IR on 8/31 followed by the start of Vancomycin + Ceftazidime. The patient did not receive HD on 8/31 and was dialyzed off-schedule (normally a MWF schedule) on 9/1 AM (3.5 hr BFR 400) and received an appropriate Vancomycin maintenance dose after that HD session. On 9/2, patient was to go for an extra HD session d/t volume overload. A dose was entered for patient to be given after HD, however RN on the floor gave it prior to HD. HD session was only 2h with BFR 400. Calculate patient's current vancomycin level to be ~24-78mcg/mL.  Per renal notes, no plans for HD this weekend and will resume MWF schedule on 9/5.  No growth on intra-op cultures, antibiotics changed to vancomycin + cefazolin per Dr. Tommy Medal- plan is for 8 weeks of therapy.  WBC wnl, afeb.  Goal of Therapy:  Pre-HD Vancomycin level of 15-25 mcg/ml Proper antibiotics for infection/cultures adjusted for renal/hepatic function   Plan:  - will obtain a pre-HD vancomycin level prior to next  session (on 9/5) - will follow for HD schedule plans. NO STANDING VANCOMYCIN IS ORDERED AT THIS TIME - Cefazolin 1g IV q24h until he is on a clear HD schedule - Follow HD schedule/tolerance, changes plans for LOT  Herson Prichard D. Sonja Manseau, PharmD, BCPS Clinical Pharmacist Pager: [phone] 12/10/2014 12:21 PM

## 2014-12-10 NOTE — Clinical Social Work Note (Signed)
Clinical Social Work Assessment  Patient Details  Name: Brad [name] MRN: [id number] Date of Birth: 21-Oct-1935  Date of referral:  12/10/14               Reason for consult:  Facility Placement                Permission sought to share information with:  Family Supports Permission granted to share information::  Yes, Verbal Permission Granted  Name::        Agency::     Relationship::     Contact Information:     Housing/Transportation Living arrangements for the past 2 months:  Single Family Home Source of Information:  Patient Patient Interpreter Needed:  None Criminal Activity/Legal Involvement Pertinent to Current Situation/Hospitalization:  No - Comment as needed Significant Relationships:  Adult Children Lives with:  Spouse Do you feel safe going back to the place where you live?  Yes Need for family participation in patient care:  Yes (Comment)  Care giving concerns: Patient currently lives with wife and son. Patient states that he is open to SNF. Currently lives in [place] and would like something close to home. Patient open to [facility] and will have wife and son help him with discharge plan.    Social Worker assessment / plan: Provided list of facilities in a 25 mile radius for patient to review with family. CSW will send information to [facility] for potential bed offers.   Employment status:  Retired Nurse, adult PT Recommendations:  [place] / Referral to community resources:  [place]  Patient/Family's Response to care: Patient is open to getting appropriate rehabilitation so that he can return home.   Patient/Family's Understanding of and Emotional Response to Diagnosis, Current Treatment, and Prognosis: Patient hopeful for a positive prognosis.   Emotional Assessment Appearance:  Appears stated age Attitude/Demeanor/Rapport:   (Pleasant and appropriate) Affect  (typically observed):  Accepting, Appropriate Orientation:  Oriented to Self, Oriented to Place, Oriented to  Time, Oriented to Situation Alcohol / Substance use:  Illicit Drugs Psych involvement (Current and /or in the community):  No (Comment)  Discharge Needs  Concerns to be addressed:  No discharge needs identified Readmission within the last 30 days:  No Current discharge risk:  None Barriers to Discharge:  No Barriers Identified   Shelbie Franken, Daneil Dolin, LCSW 12/10/2014, 3:25 PM

## 2014-12-10 NOTE — Progress Notes (Signed)
[facility] Progress Note  Assessment/Plan: 1. Volume overload/pulm edema - Had HD Thursday and Friday (UF 1.9) - post weight 49.7 on Friday - sats ok, still needs more volume removed. 2. L4-5 osteomyelitis - on day 4 Vanc and Fortaz, awaiting cultures  3. ESRD - MWF - no heparin HD - NEXT HD Monday 4. Hypertension/volume -BP ^ , titrating meds to allow volume removal and lowering EDW for d/c, UF as tolerated Monday 5. Anemia - Hgb 9.7>10.1 consistent with outpt Hgb - continue ESA (start Aranesp 60 8/31) and weekly Fe On hd  6. 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 Jimmy Footman - hold calcitriol for now - fu am ca phos , decr phoslo 2 to 1 ac 7. Nutrition -Alb 2.9 >2.6-renal diet + supplements/vit when eating 8. Balanitis/penile ulceration - per primary, topical hydrocortisone  Myriam Jacobson, PA-C [facility] [place] [phone] 12/10/2014,8:24 AM  LOS: 5 days   Subjective:   Ate all of breakfast, pain in back moves around sometimes in legs Objective Filed Vitals:   12/09/14 0506 12/09/14 1717 12/09/14 2053 12/10/14 0505  BP: 150/72 149/61 156/95 180/75  Pulse: 72 82 81 75  Temp: 98.2 F (36.8 C) 98.2 F (36.8 C) 98.2 F (36.8 C) 98.2 F (36.8 C)  TempSrc:  Oral    Resp: 17 18 18 17   Height:      Weight:   51.7 kg (113 lb 15.7 oz)   SpO2: 100% 100% 100% 100%   Physical Exam General: emaciated NAD alert Heart: RRR Lungs: few crackles Abdomen: scaphoid NT Extremities: no LE edema, s/p RUE amputation Dialysis Access: left AVGG + bruit  Dialysis Orders: MWF @ [facility]  3hr 45 mins 53kgs 2K/2Ca+ NO heparin L AVG Micera 50 8/24 Calcitriol 0.5 venofer 50 /week 8/24 Hgb 9.7 drifting down from 10.7 8/17 and 11.6 8/10 iPTH 233 - last Ca 10.5 and P 3.4  tsat 45% August - no ferritin since last October- need to order at d/c  Additional Objective Labs: Basic Metabolic  Panel:  Recent Labs Lab 12/05/14 1946 12/06/14 0452 12/08/14 0518  NA 135 136 137  K 3.9 4.2 4.3  CL 92* 93* 95*  CO2 27 26 28   GLUCOSE 112* 81 106*  BUN 27* 30* 23*  CREATININE 6.52* 7.39* 6.25*  CALCIUM 9.0 8.8* 8.8*   Liver Function Tests:  Recent Labs Lab 12/05/14 1946 12/06/14 0452  AST 31 25  ALT 18 16*  ALKPHOS 93 87  BILITOT 1.6* 1.4*  PROT 6.9 7.1  ALBUMIN 2.9* 2.6*   CBC:  Recent Labs Lab 12/05/14 1946 12/06/14 0452 12/08/14 0518  WBC 7.2 5.9 7.1  NEUTROABS 4.6  --   --   HGB 9.7* 9.5* 10.1*  HCT 28.8* 28.5* 30.6*  MCV 79.3 78.9 79.9  PLT 227 209 228   Blood Culture    Component Value Date/Time   SDES BLOOD RIGHT FOOT 12/06/2014 1602   SPECREQUEST BOTTLES DRAWN AEROBIC ONLY  5CC 12/06/2014 1602   CULT NO GROWTH 3 DAYS 12/06/2014 1602   REPTSTATUS PENDING 12/06/2014 1602   Medications:   . amLODipine  5 mg Oral QHS  . antiseptic oral rinse  7 mL Mouth Rinse q12n4p  . atorvastatin  40 mg Oral Daily  . calcium acetate  1,334 mg Oral TID WC  . cefTAZidime (FORTAZ)  IV  1 g Intravenous Q24H  . chlorhexidine  15 mL Mouth  Rinse BID  . cinacalcet  60 mg Oral Q breakfast  . [START ON 12/11/2014] colchicine  0.3 mg Oral Once per day on Mon Thu  . [START ON 12/13/2014] darbepoetin (ARANESP) injection - DIALYSIS  60 mcg Intravenous Q Wed-HD  . feeding supplement (NEPRO CARB STEADY)  237 mL Oral BID BM  . heparin subcutaneous  5,000 Units Subcutaneous 3 times per day  . hydrALAZINE  50 mg Oral BID  . hydrocortisone cream   Topical BID  . losartan  25 mg Oral Daily  . metoprolol  100 mg Oral QHS  . mirtazapine  7.5 mg Oral Daily  . multivitamin  1 tablet Oral QHS  . pantoprazole  40 mg Oral Daily  . sodium chloride  3 mL Intravenous Q12H

## 2014-12-10 NOTE — Progress Notes (Addendum)
Patient Demographics  Brad [name], is a 79 y.o. male, DOB - Oct 16, 1935, TGG:[id number]  Admit date - 12/05/2014   Admitting Physician Lavina Hamman, MD  Outpatient Primary MD for the patient is No primary care provider on file.  LOS - 5   Chief Complaint  Patient presents with  . Back Pain         Subjective:   Brad [name] today has, No headache, No chest pain, No abdominal pain - No Nausea, No new weakness tingling or numbness, No Cough - dyspnea resolved.  Assessment & Plan    Principal Problem:   Osteomyelitis Active Problems:   HYPERTENSION, BENIGN SYSTEMIC   ESRD on dialysis   Anemia of chronic renal failure, stage 5   Protein-calorie malnutrition, severe   Discitis   Pressure ulcer  osteomyelitis/discitis of lumbar spine - As evident by MRI of lumbar spine done by orthopedic as an outpatient. - IR lumbar disc aspiration on 8/31. - on IV vancomycin and ceftazidime on 8/31 after blood cultures and aspiration has been done. - Follow on aspiration culture and blood culture (no growth to date), D/W Dr Lucianne Lei dam from ID, will change ceftazidime to Ancef, and continue with IV vancomycin and Ancef for total of 8 weeks for treatment of his cellulitis/discitis, which can be given during hemodialysis. - ID following  End-stage renal disease - Nephrology consulted  Acute hypoxic respiratory failure - Patient with significant dyspnea/hypoxia 9/1, evidence of volume overload on chest x-ray, required hemodialysis today's back-to-back 9/1, 9/2, significantly improved, denies any further dyspnea, will try to wean off oxygen,  Balanitis -  on topical hydrocortisone.  History of gout - Continue with home medication  Essential hypertension  - Labile, continue with home medication.    Code Status: Full  Family Communication: Discussed with wife over the phone.  Disposition  Plan: SNF once stable    Procedures  IR lumbar disc aspiration 12/06/14    Consults   Infectious disease Nephrology Orthopedics   Medications  Scheduled Meds: . amLODipine  5 mg Oral QHS  . antiseptic oral rinse  7 mL Mouth Rinse q12n4p  . atorvastatin  40 mg Oral Daily  . calcium acetate  1,334 mg Oral TID WC  . chlorhexidine  15 mL Mouth Rinse BID  . cinacalcet  60 mg Oral Q breakfast  . [START ON 12/11/2014] colchicine  0.3 mg Oral Once per day on Mon Thu  . [START ON 12/13/2014] darbepoetin (ARANESP) injection - DIALYSIS  60 mcg Intravenous Q Wed-HD  . feeding supplement (NEPRO CARB STEADY)  237 mL Oral BID BM  . heparin subcutaneous  5,000 Units Subcutaneous 3 times per day  . hydrALAZINE  50 mg Oral BID  . hydrocortisone cream   Topical BID  . losartan  25 mg Oral Daily  . metoprolol  100 mg Oral QHS  . mirtazapine  7.5 mg Oral Daily  . multivitamin  1 tablet Oral QHS  . pantoprazole  40 mg Oral Daily  . sodium chloride  3 mL Intravenous Q12H   Continuous Infusions:  PRN Meds:.acetaminophen **OR** acetaminophen, hydrALAZINE, ondansetron **OR** ondansetron (ZOFRAN) IV, oxyCODONE, traMADol  DVT Prophylaxis  - Heparin -   Lab Results  Component Value Date  PLT 228 12/08/2014    Antibiotics    Anti-infectives    Start     Dose/Rate Route Frequency Ordered Stop   12/08/14 1300  vancomycin (VANCOCIN) 500 mg in sodium chloride 0.9 % 100 mL IVPB     500 mg 100 mL/hr over 60 Minutes Intravenous  Once 12/08/14 1047 12/08/14 1307   12/07/14 2000  cefTAZidime (FORTAZ) 1 g in dextrose 5 % 50 mL IVPB  Status:  Discontinued     1 g 100 mL/hr over 30 Minutes Intravenous Every 24 hours 12/07/14 0915 12/10/14 1149   12/07/14 1200  vancomycin (VANCOCIN) 500 mg in sodium chloride 0.9 % 100 mL IVPB     500 mg 100 mL/hr over 60 Minutes Intravenous Every Thu (Hemodialysis) 12/06/14 1834 12/07/14 1116   12/07/14 1200  cefTAZidime (FORTAZ) 2 g in dextrose 5 % 50 mL IVPB  Status:   Discontinued     2 g 100 mL/hr over 30 Minutes Intravenous Every Thu (Hemodialysis) 12/06/14 1834 12/07/14 0915   12/06/14 1900  vancomycin (VANCOCIN) 1,250 mg in sodium chloride 0.9 % 250 mL IVPB     1,250 mg 166.7 mL/hr over 90 Minutes Intravenous  Once 12/06/14 1834 12/07/14 0141   12/06/14 1900  cefTAZidime (FORTAZ) 2 g in dextrose 5 % 50 mL IVPB     2 g 100 mL/hr over 30 Minutes Intravenous  Once 12/06/14 1834 12/06/14 2123          Objective:   Filed Vitals:   12/09/14 1717 12/09/14 2053 12/10/14 0505 12/10/14 1032  BP: 149/61 156/95 180/75 155/88  Pulse: 82 81 75 79  Temp: 98.2 F (36.8 C) 98.2 F (36.8 C) 98.2 F (36.8 C) 98.2 F (36.8 C)  TempSrc: Oral   Oral  Resp: 18 18 17 18   Height:      Weight:  51.7 kg (113 lb 15.7 oz)    SpO2: 100% 100% 100% 100%    Wt Readings from Last 3 Encounters:  12/09/14 51.7 kg (113 lb 15.7 oz)  11/25/14 53.978 kg (119 lb)  08/18/14 52.8 kg (116 lb 6.5 oz)     Intake/Output Summary (Last 24 hours) at 12/10/14 1153 Last data filed at 12/10/14 0855  Gross per 24 hour  Intake   1397 ml  Output      0 ml  Net   1397 ml     Physical Exam  Awake Alert, Oriented X 3,  [place].AT,PERRAL Supple Neck,No JVD, No cervical lymphadenopathy appriciated.  Symmetrical Chest wall movement, Good air movement bilaterally,  RRR,No Gallops,Rubs or new Murmurs, No Parasternal Heave +ve B.Sounds, Abd Soft, No tenderness, No organomegaly appriciated, No Cyanosis, Clubbing or edema, right upper extremity amputated.    Data Review   Micro Results Recent Results (from the past 240 hour(s))  MRSA PCR Screening     Status: None   Collection Time: 12/06/14 12:59 AM  Result Value Ref Range Status   MRSA by PCR NEGATIVE NEGATIVE Final    Comment:        The GeneXpert MRSA Assay (FDA approved for NASAL specimens only), is one component of a comprehensive MRSA colonization surveillance program. It is not intended to diagnose MRSA infection  nor to guide or monitor treatment for MRSA infections.   Fungus Culture with Smear     Status: None (Preliminary result)   Collection Time: 12/06/14  1:51 PM  Result Value Ref Range Status   Specimen Description WOUND BACK  Final   Special Requests  5 DISC SPACE L4  Final   Fungal Smear   Final    NO YEAST OR FUNGAL ELEMENTS SEEN Performed at [facility]    Culture   Final    CULTURE IN PROGRESS FOR FOUR WEEKS Performed at [facility]    Report Status PENDING  Incomplete  AFB culture with smear     Status: None (Preliminary result)   Collection Time: 12/06/14  1:51 PM  Result Value Ref Range Status   Specimen Description WOUND BACK  Final   Special Requests 5 DISC SPACE L4  Final   Acid Fast Smear   Final    NO ACID FAST BACILLI SEEN Performed at [facility]    Culture   Final    CULTURE WILL BE EXAMINED FOR 6 WEEKS BEFORE ISSUING A FINAL REPORT Performed at [facility]    Report Status PENDING  Incomplete  Wound culture     Status: None   Collection Time: 12/06/14  1:51 PM  Result Value Ref Range Status   Specimen Description WOUND BACK  Final   Special Requests 5 DISC SPACE L4  Final   Gram Stain   Final    RARE WBC PRESENT,BOTH PMN AND MONONUCLEAR NO SQUAMOUS EPITHELIAL CELLS SEEN NO ORGANISMS SEEN Performed at [facility]    Culture   Final    NO GROWTH 2 DAYS Performed at [facility]    Report Status 12/09/2014 FINAL  Final  Culture, blood (routine x 2)     Status: None (Preliminary result)   Collection Time: 12/06/14  3:55 PM  Result Value Ref Range Status   Specimen Description BLOOD LEFT FOOT  Final   Special Requests BOTTLES DRAWN AEROBIC ONLY  5CC  Final   Culture NO GROWTH 3 DAYS  Final   Report Status PENDING  Incomplete  Culture, blood (routine x 2)     Status: None (Preliminary result)   Collection Time: 12/06/14  4:02 PM  Result Value Ref Range Status   Specimen Description BLOOD RIGHT FOOT   Final   Special Requests BOTTLES DRAWN AEROBIC ONLY  5CC  Final   Culture NO GROWTH 3 DAYS  Final   Report Status PENDING  Incomplete    Radiology Reports Mr Lumbar Spine Wo Contrast  12/05/2014   CLINICAL DATA:  Low back pain with left hip pain, worsening for 3 months. Chronic renal failure on dialysis. History of gout.  EXAM: MRI LUMBAR SPINE WITHOUT CONTRAST  TECHNIQUE: Multiplanar, multisequence MR imaging of the lumbar spine was performed. No intravenous contrast was administered.  COMPARISON:  Right hip MRI 06/08/2013. Right hip radiographs 06/04/2013. No prior lumbar spine imaging.  FINDINGS: There is straightening of the normal lumbar lordosis. There is no significant listhesis. Disc desiccation is present in the visualized lower thoracic spine and from L1-2 to L3-4. Multiple Schmorl's nodes are noted, including a prominent 1 involving the L3 superior endplate. There is no evidence of compression fracture.  There is marked disc space height loss at L4-5 with abnormal STIR hyperintensity in the disc space and erosive changes of the endplates. Prominent marrow edema is present throughout the L4 and L5 vertebral bodies with some extension into the pedicles. Marrow edema was present subjacent to the L5 superior endplate on the prior hip MRI but has progressed in the interim. Disc space collapse has progressed since the prior hip radiographs. There is circumferential displacement of low T2 signal material from the disc space. No  paraspinal fluid collection or epidural abscess is seen.  The conus medullaris is normal in signal and terminates at T12. The kidneys are atrophic and contain numerous cysts, incompletely evaluated.  T12-L1:  Minimal disc bulging without stenosis.  L1-2:  Minimal disc bulging without stenosis.  L2-3:  Mild disc bulging without stenosis.  L3-4: Mild-to-moderate circumferential disc bulging and moderate facet hypertrophy result in mild spinal stenosis, moderate bilateral lateral  recess stenosis, and mild bilateral neural foraminal stenosis.  L4-5: Circumferentially displaced material from the disc space, centrally extruded disc material behind the L4 vertebral body, and severe facet hypertrophy result in mild spinal stenosis, severe bilateral lateral recess stenosis, and severe bilateral neural foraminal stenosis. A small right facet joint effusion is noted.  L5-S1: Mild disc bulging and facet hypertrophy result in mild bilateral lateral recess narrowing without spinal canal or neural foraminal stenosis.  IMPRESSION: 1. Destructive process centered at the L4-5 disc space which has progressed from hip imaging performed in 06/2013 and is concerning for infectious discitis/osteomyelitis. Displaced disc material contributes to mild spinal stenosis and severe bilateral neural foraminal and lateral recess stenosis. No evidence of epidural or paravertebral abscess. Other potential etiologies, such as hemodialysis spondyloarthropathy and gout, are possible but not favored. 2. Mild-to-moderate disc and facet degeneration elsewhere as above. These results will be called to the ordering clinician or representative by the Radiologist Assistant, and communication documented in the PACS or zVision Dashboard.   Electronically Signed   By: Logan Bores M.D.   On: 12/05/2014 13:27   US Carotid Bilateral  11/28/2014   CLINICAL DATA:  Syncope.  EXAM: BILATERAL CAROTID DUPLEX ULTRASOUND  TECHNIQUE: Pearline Cables scale imaging, color Doppler and duplex ultrasound were performed of bilateral carotid and vertebral arteries in the neck.  COMPARISON:  None.  FINDINGS: Criteria: Quantification of carotid stenosis is based on velocity parameters that correlate the residual internal carotid diameter with NASCET-based stenosis levels, using the diameter of the distal internal carotid lumen as the denominator for stenosis measurement.  The following velocity measurements were obtained:  RIGHT  ICA:  85/23 cm/sec  CCA:  21/1  cm/sec  SYSTOLIC ICA/CCA RATIO:  1.0  DIASTOLIC ICA/CCA RATIO:  3.4  ECA:  54 cm/sec  LEFT  ICA:  113/13 cm/sec  CCA:  94/1 cm/sec  SYSTOLIC ICA/CCA RATIO:  1.6  DIASTOLIC ICA/CCA RATIO:  1.5  ECA:  107 cm/sec  RIGHT CAROTID ARTERY: Mild multifocal atherosclerotic plaque right carotid bifurcation. No flow limiting stenosis. Waveforms unremarkable.  RIGHT VERTEBRAL ARTERY: Patent with antegrade flow. Flow appears diminished.  LEFT CAROTID ARTERY: Mild multifocal atherosclerotic plaque left carotid bifurcation. No flow limiting stenosis. Waveforms unremarkable.  LEFT VERTEBRAL ARTERY: Patent with antegrade flow. Elevated flow velocities in the left vertebral.  IMPRESSION: 1. Mild multifocal atherosclerotic vascular plaque both carotid bifurcations. No flow limiting stenosis. Degree of stenosis less than 50%. 2. Vertebral arteries are patent with antegrade flow. Antegrade flow is diminished in the right vertebral artery. Elevated flow velocities noted in the left vertebral artery . Findings suggestive the presence of bilateral subclavian and/or vertebral artery disease.   Electronically Signed   By: [place]   On: 11/28/2014 12:11   Dg Chest Port 1 View  12/07/2014   CLINICAL DATA:  Hypoxia.  End-stage renal disease on dialysis.  EXAM: PORTABLE CHEST - 1 VIEW  COMPARISON:  06/08/2013  FINDINGS: The patient is rotated to the right, partially limiting evaluation of the mediastinum. Cardiomediastinal silhouette is grossly unchanged, again with evidence of  mild cardiomegaly and aortic tortuosity. There is new, mild pulmonary vascular congestion with indistinctness of the pulmonary vasculature and mild bilateral lung opacities which are greatest in the perihilar regions. No pleural effusion or pneumothorax is identified. No acute osseous abnormality is seen.  IMPRESSION: Mild cardiomegaly with new pulmonary vascular congestion and bilateral lung opacities suggestive of mild edema.   Electronically Signed   By:  Logan Bores M.D.   On: 12/07/2014 14:09     CBC  Recent Labs Lab 12/05/14 1946 12/06/14 0452 12/08/14 0518  WBC 7.2 5.9 7.1  HGB 9.7* 9.5* 10.1*  HCT 28.8* 28.5* 30.6*  PLT 227 209 228  MCV 79.3 78.9 79.9  MCH 26.7 26.3 26.4  MCHC 33.7 33.3 33.0  RDW 19.8* 20.0* 20.1*  LYMPHSABS 1.0  --   --   MONOABS 0.9  --   --   EOSABS 0.6  --   --   BASOSABS 0.0  --   --     Chemistries   Recent Labs Lab 12/05/14 1946 12/06/14 0452 12/08/14 0518  NA 135 136 137  K 3.9 4.2 4.3  CL 92* 93* 95*  CO2 27 26 28   GLUCOSE 112* 81 106*  BUN 27* 30* 23*  CREATININE 6.52* 7.39* 6.25*  CALCIUM 9.0 8.8* 8.8*  AST 31 25  --   ALT 18 16*  --   ALKPHOS 93 87  --   BILITOT 1.6* 1.4*  --    ------------------------------------------------------------------------------------------------------------------ estimated creatinine clearance is 7.4 mL/min (by C-G formula based on Cr of 6.25). ------------------------------------------------------------------------------------------------------------------ No results for input(s): HGBA1C in the last 72 hours. ------------------------------------------------------------------------------------------------------------------ No results for input(s): CHOL, HDL, LDLCALC, TRIG, CHOLHDL, LDLDIRECT in the last 72 hours. ------------------------------------------------------------------------------------------------------------------ No results for input(s): TSH, T4TOTAL, T3FREE, THYROIDAB in the last 72 hours.  Invalid input(s): FREET3 ------------------------------------------------------------------------------------------------------------------ No results for input(s): VITAMINB12, FOLATE, FERRITIN, TIBC, IRON, RETICCTPCT in the last 72 hours.  Coagulation profile  Recent Labs Lab 12/06/14 0452  INR 1.28    No results for input(s): DDIMER in the last 72 hours.  Cardiac Enzymes No results for input(s): CKMB, TROPONINI, MYOGLOBIN in the last  168 hours.  Invalid input(s): CK ------------------------------------------------------------------------------------------------------------------ Invalid input(s): POCBNP     Time Spent in minutes   30 minutes   Jkwon Treptow M.D on 12/10/2014 at 11:53 AM  Between 7am to 7pm - Pager - [phone]  After 7pm go to [email or web address] - password [facility]  [facility]   Office  [phone]

## 2014-12-11 DIAGNOSIS — N2581 Secondary hyperparathyroidism of renal origin: Secondary | ICD-10-CM | POA: Diagnosis not present

## 2014-12-11 DIAGNOSIS — D631 Anemia in chronic kidney disease: Secondary | ICD-10-CM | POA: Diagnosis not present

## 2014-12-11 DIAGNOSIS — E43 Unspecified severe protein-calorie malnutrition: Secondary | ICD-10-CM | POA: Diagnosis not present

## 2014-12-11 DIAGNOSIS — R52 Pain, unspecified: Secondary | ICD-10-CM | POA: Diagnosis not present

## 2014-12-11 DIAGNOSIS — R2681 Unsteadiness on feet: Secondary | ICD-10-CM | POA: Diagnosis not present

## 2014-12-11 DIAGNOSIS — I120 Hypertensive chronic kidney disease with stage 5 chronic kidney disease or end stage renal disease: Secondary | ICD-10-CM | POA: Diagnosis not present

## 2014-12-11 DIAGNOSIS — M549 Dorsalgia, unspecified: Secondary | ICD-10-CM | POA: Diagnosis not present

## 2014-12-11 DIAGNOSIS — M6281 Muscle weakness (generalized): Secondary | ICD-10-CM | POA: Diagnosis not present

## 2014-12-11 DIAGNOSIS — I471 Supraventricular tachycardia: Secondary | ICD-10-CM | POA: Diagnosis not present

## 2014-12-11 DIAGNOSIS — M4626 Osteomyelitis of vertebra, lumbar region: Secondary | ICD-10-CM | POA: Diagnosis not present

## 2014-12-11 DIAGNOSIS — M109 Gout, unspecified: Secondary | ICD-10-CM | POA: Diagnosis not present

## 2014-12-11 DIAGNOSIS — Z992 Dependence on renal dialysis: Secondary | ICD-10-CM | POA: Diagnosis not present

## 2014-12-11 DIAGNOSIS — R262 Difficulty in walking, not elsewhere classified: Secondary | ICD-10-CM | POA: Diagnosis not present

## 2014-12-11 DIAGNOSIS — E46 Unspecified protein-calorie malnutrition: Secondary | ICD-10-CM | POA: Diagnosis not present

## 2014-12-11 DIAGNOSIS — M4646 Discitis, unspecified, lumbar region: Secondary | ICD-10-CM | POA: Diagnosis not present

## 2014-12-11 DIAGNOSIS — N186 End stage renal disease: Secondary | ICD-10-CM | POA: Diagnosis not present

## 2014-12-11 DIAGNOSIS — I10 Essential (primary) hypertension: Secondary | ICD-10-CM | POA: Diagnosis not present

## 2014-12-11 DIAGNOSIS — D649 Anemia, unspecified: Secondary | ICD-10-CM | POA: Diagnosis not present

## 2014-12-11 DIAGNOSIS — M869 Osteomyelitis, unspecified: Secondary | ICD-10-CM | POA: Diagnosis not present

## 2014-12-11 DIAGNOSIS — Z5181 Encounter for therapeutic drug level monitoring: Secondary | ICD-10-CM | POA: Diagnosis not present

## 2014-12-11 LAB — CBC
HEMATOCRIT: 28.9 % — AB (ref 39.0–52.0)
HEMOGLOBIN: 9.5 g/dL — AB (ref 13.0–17.0)
MCH: 26.4 pg (ref 26.0–34.0)
MCHC: 32.9 g/dL (ref 30.0–36.0)
MCV: 80.3 fL (ref 78.0–100.0)
Platelets: 288 10*3/uL (ref 150–400)
RBC: 3.6 MIL/uL — ABNORMAL LOW (ref 4.22–5.81)
RDW: 20.6 % — ABNORMAL HIGH (ref 11.5–15.5)
WBC: 9.1 10*3/uL (ref 4.0–10.5)

## 2014-12-11 LAB — CULTURE, BLOOD (ROUTINE X 2)
CULTURE: NO GROWTH
CULTURE: NO GROWTH

## 2014-12-11 LAB — BASIC METABOLIC PANEL
ANION GAP: 13 (ref 5–15)
BUN: 44 mg/dL — ABNORMAL HIGH (ref 6–20)
CALCIUM: 10.3 mg/dL (ref 8.9–10.3)
CHLORIDE: 93 mmol/L — AB (ref 101–111)
CO2: 30 mmol/L (ref 22–32)
Creatinine, Ser: 8.87 mg/dL — ABNORMAL HIGH (ref 0.61–1.24)
GFR calc non Af Amer: 5 mL/min — ABNORMAL LOW (ref >60)
GFR, EST AFRICAN AMERICAN: 6 mL/min — AB (ref >60)
GLUCOSE: 94 mg/dL (ref 65–99)
POTASSIUM: 4.8 mmol/L (ref 3.5–5.1)
Sodium: 136 mmol/L (ref 135–145)

## 2014-12-11 LAB — VANCOMYCIN, RANDOM: Vancomycin Rm: 13 ug/mL

## 2014-12-11 MED ORDER — CEFAZOLIN SODIUM 1-5 GM-% IV SOLN
2.0000 g | INTRAVENOUS | Status: DC
Start: 2014-12-11 — End: 2014-12-11

## 2014-12-11 MED ORDER — VANCOMYCIN HCL 500 MG IV SOLR
500.0000 mg | INTRAVENOUS | Status: DC
  Administered 2014-12-11: 500 mg via INTRAVENOUS
  Filled 2014-12-11: qty 500

## 2014-12-11 MED ORDER — CALCIUM ACETATE (PHOS BINDER) 667 MG PO CAPS
667.0000 mg | ORAL_CAPSULE | Freq: Three times a day (TID) | ORAL | Status: DC
  Administered 2014-12-11 (×2): 667 mg via ORAL
  Filled 2014-12-11 (×2): qty 1

## 2014-12-11 MED ORDER — VANCOMYCIN HCL 500 MG IV SOLR: 500.0000 mg | INTRAVENOUS | Status: DC

## 2014-12-11 MED ORDER — CEFAZOLIN SODIUM-DEXTROSE 2-3 GM-% IV SOLR
2.0000 g | INTRAVENOUS | Status: DC
  Administered 2014-12-11: 2 g via INTRAVENOUS
  Filled 2014-12-11: qty 50

## 2014-12-11 MED ORDER — SEVELAMER CARBONATE 2.4 G PO PACK
2.4000 g | PACK | Freq: Three times a day (TID) | ORAL | Status: DC
  Administered 2014-12-11: 2.4 g via ORAL
  Filled 2014-12-11 (×4): qty 1

## 2014-12-11 MED ORDER — VANCOMYCIN HCL 500 MG IV SOLR: 500.0000 mg | INTRAVENOUS | Status: AC

## 2014-12-11 MED ORDER — NEPRO/CARBSTEADY PO LIQD: 237.0000 mL | Freq: Two times a day (BID) | ORAL | Status: AC

## 2014-12-11 MED ORDER — CEFAZOLIN SODIUM-DEXTROSE 2-3 GM-% IV SOLR: 2.0000 g | INTRAVENOUS | Status: AC

## 2014-12-11 NOTE — Discharge Instructions (Signed)
Follow with Primary MD after discharge from SNF    Activity: As tolerated with Full fall precautions use walker/cane & assistance as needed   Disposition SNF   Diet: Heart Healthy ,  renal diet with 1200 mL fluid restriction,  feeding assistance and aspiration precautions.  For Heart failure patients - Check your Weight same time everyday, if you gain over 2 pounds, or you develop in leg swelling, experience more shortness of breath or chest pain, call your Primary MD immediately. Follow Cardiac Low Salt Diet and 1.5 lit/day fluid restriction.   On your next visit with your primary care physician please Get Medicines reviewed and adjusted.   Please request your Prim.MD to go over all Hospital Tests and Procedure/Radiological results at the follow up, please get all Hospital records sent to your Prim MD by signing hospital release before you go home.   If you experience worsening of your admission symptoms, develop shortness of breath, life threatening emergency, suicidal or homicidal thoughts you must seek medical attention immediately by calling 911 or calling your MD immediately  if symptoms less severe.  You Must read complete instructions/literature along with all the possible adverse reactions/side effects for all the Medicines you take and that have been prescribed to you. Take any new Medicines after you have completely understood and accpet all the possible adverse reactions/side effects.   Do not drive, operating heavy machinery, perform activities at heights, swimming or participation in water activities or provide baby sitting services if your were admitted for syncope or siezures until you have seen by Primary MD or a Neurologist and advised to do so again.  Do not drive when taking Pain medications.    Do not take more than prescribed Pain, Sleep and Anxiety Medications  Special Instructions: If you have smoked or chewed Tobacco  in the last 2 yrs please stop smoking,  stop any regular Alcohol  and or any Recreational drug use.  Wear Seat belts while driving.   Please note  You were cared for by a hospitalist during your hospital stay. If you have any questions about your discharge medications or the care you received while you were in the hospital after you are discharged, you can call the unit and asked to speak with the hospitalist on call if the hospitalist that took care of you is not available. Once you are discharged, your primary care physician will handle any further medical issues. Please note that NO REFILLS for any discharge medications will be authorized once you are discharged, as it is imperative that you return to your primary care physician (or establish a relationship with a primary care physician if you do not have one) for your aftercare needs so that they can reassess your need for medications and monitor your lab values.

## 2014-12-11 NOTE — Procedures (Signed)
Patient was seen on dialysis and the procedure was supervised.  BFR 400  Via AVG BP is  174/92.   Patient appears to be tolerating treatment well  Brad [name] A 12/11/2014

## 2014-12-11 NOTE — Progress Notes (Signed)
Attempted to call report to [place] three times.  No answer. EMS called for transport.

## 2014-12-11 NOTE — Clinical Social Work Placement (Signed)
   CLINICAL SOCIAL WORK PLACEMENT  NOTE  Date:  12/11/2014  Patient Details  Name: Brad [name] MRN: [id number] Date of Birth: Jan 15, 1936  Clinical Social Work is seeking post-discharge placement for this patient at the [place] level of care (*CSW will initial, date and re-position this form in  chart as items are completed):  Yes   Patient/family provided with [facility] Work Department's list of facilities offering this level of care within the geographic area requested by the patient (or if unable, by the patient's family).  Yes   Patient/family informed of their freedom to choose among providers that offer the needed level of care, that participate in Medicare, Medicaid or managed care program needed by the patient, have an available bed and are willing to accept the patient.  Yes   Patient/family informed of [facility]'s ownership interest in [facility] and [facility], as well as of the fact that they are under no obligation to receive care at these facilities.  PASRR submitted to EDS on 12/10/14     PASRR number received on 12/10/14     Existing PASRR number confirmed on       FL2 transmitted to all facilities in geographic area requested by pt/family on 12/10/14     FL2 transmitted to all facilities within larger geographic area on       Patient informed that his/her managed care company has contracts with or will negotiate with certain facilities, including the following:         12/11/14   Patient/family informed of bed offers received.  Patient chooses bed at  [place]     Physician recommends and patient chooses bed at      Patient to be transferred to  [facility] on  12/11/14.  Patient to be transferred to facility by  ambulance     Patient family notified on  12/12/14 of transfer.  Name of family member notified:   Spouse, Brad [name].     PHYSICIAN       Additional Comment:     _______________________________________________ Sable Feil, LCSW 12/11/2014, 5:17 PM

## 2014-12-11 NOTE — Progress Notes (Signed)
Physical Therapy Treatment Patient Details Name: Brad [name] MRN: [id number] DOB: [birth year] Today's Date: 12/11/2014    History of Present Illness Brad [name] is a 79 y.o. male with Past medical history of ESRD on hemodialysis, hypertension, dyslipidemia, gout, GERD.  The patient is presenting with compressive back pain. Patient mentions he had a fall few weeks ago and started having complaints of back pain. Has been progressively worsening.  MRI concerning for osteomyelitis at L4/5.     PT Comments    Patient agreeable to work with therapy and eager to walk. Still required Min A due to some instability and imbalances with gait. Patient a little less impulsive today. Will required 24.7 min A at home. Continue to recommend SNF for ongoing Physical Therapy.     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 No assist needed  Ambulation/Gait Ambulation/Gait assistance: Min assist Ambulation Distance (Feet): 150 Feet Assistive device: 1 person hand held assist Gait Pattern/deviations: Step-through pattern;Decreased stride length;Wide base of support     General Gait Details: Patient continues with wide BOS. Could not find cane to use with patient therefore continued with mild support with HHA. Patient does have a cane at home but stated he didnt use it. Patient appeared more stable and less impulsive today with his ambulation.    Stairs            Wheelchair Mobility    Modified Rankin  (Stroke Patients Only)       Balance                                    Cognition Arousal/Alertness: Awake/alert Behavior During Therapy: WFL for tasks assessed/performed Overall Cognitive Status: Within Functional Limits for tasks assessed                      Exercises      General Comments        Pertinent Vitals/Pain Pain Assessment: No/denies pain    Home Living                      Prior Function            PT Goals (current goals can now be found in the care plan section) Progress towards PT goals: Progressing toward goals    Frequency  Min 3X/week    PT Plan Current plan remains appropriate    Co-evaluation             End of Session Equipment Utilized During Treatment: Gait belt Activity Tolerance: Patient tolerated treatment well Patient left: in chair;with call bell/phone within reach     Time: 1044-1100 PT Time Calculation (min) (ACUTE ONLY): 16 min  Charges:  $Gait Training: 8-22 mins                    G Codes:  Jacqualyn Posey 12/11/2014, 12:08 PM  12/11/2014 Jacqualyn Posey PTA [phone] pager [phone] office

## 2014-12-11 NOTE — Progress Notes (Signed)
Subjective:   Says breathing is much better, no complaints- waiting to go to HD today.  Objective Filed Vitals:   12/10/14 2035 12/10/14 2103 12/11/14 0438 12/11/14 0842  BP: 197/74 189/71 135/70 139/80  Pulse:  81 80 79  Temp:  98.8 F (37.1 C) 98.1 F (36.7 C) 99.1 F (37.3 C)  TempSrc:  Oral Oral Oral  Resp:  18 19 18   Height:      Weight:  54.6 kg (120 lb 5.9 oz)    SpO2:  97% 99% 98%   Physical Exam General: alert and oriented, no acute distress.  Heart: RRR  Lungs: CTA, unlabored  Abdomen: thin, nontender +BS  Extremities: R arm amputation. No edema Dialysis Access:  L AVG +b/t  Dialysis Orders: MWF @ [facility]  3hr 45 mins 53kgs 2K/2Ca+ NO heparin L AVG Micera 50 8/24 Calcitriol 0.5 venofer 50 /week 8/24 Hgb 9.7 drifting down from 10.7 8/17 and 11.6 8/10 iP[facility] 233 - last Ca 10.5 and P 3.4  tsat 45% August - no ferritin since last October- need to order at d/c  Assessment/Plan: 1. Volume overload/pulm edema - Had HD Thursday and Friday (UF 1.9) - post weight 49.7 on Friday- needs lower edw 2. L4-5 osteomyelitis - on day 4 Vanc and Fortaz, awaiting final culture results- no growth to date. ID following. Will need Ancef total of 8 weeks  3. ESRD - MWF - no heparin HD - HD pending today 4. Hypertension/volume -BP better , titrating meds to allow volume removal and lowering EDW for d/c, UF as tolerated Monday 5. Anemia - Hgb 9.7>10.1 consistent with outpt Hgb - continue ESA (start Aranesp 60 8/31) and weekly Fe On hd  6. 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 Jimmy Footman - hold calcitriol for now - fu am ca phos , decr phoslo 2 to 1 ac- on sensipar.  7. Nutrition -Alb 2.9 >2.6-renal diet + supplements/vit when eating 8. Balanitis/penile ulceration - per primary, topical hydrocortisone 9. dispo- to SNF  Shelle Iron, NP [place] [phone] 12/11/2014,9:26 AM  LOS:  6 days   Pt seen, examined and agree w A/P as above. Much better, for dc to SNF after HD today.  Kelly Splinter MD pager [phone]    cell [phone] 12/11/2014, 10:58 AM      Additional Objective Labs: Basic Metabolic Panel:  Recent Labs Lab 12/06/14 0452 12/08/14 0518 12/11/14 0452  NA 136 137 136  K 4.2 4.3 4.8  CL 93* 95* 93*  CO2 26 28 30   GLUCOSE 81 106* 94  BUN 30* 23* 44*  CREATININE 7.39* 6.25* 8.87*  CALCIUM 8.8* 8.8* 10.3   Liver Function Tests:  Recent Labs Lab 12/05/14 1946 12/06/14 0452  AST 31 25  ALT 18 16*  ALKPHOS 93 87  BILITOT 1.6* 1.4*  PROT 6.9 7.1  ALBUMIN 2.9* 2.6*   No results for input(s): LIPASE, AMYLASE in the last 168 hours. CBC:  Recent Labs Lab 12/05/14 1946 12/06/14 0452 12/08/14 0518 12/11/14 0452  WBC 7.2 5.9 7.1 9.1  NEUTROABS 4.6  --   --   --   HGB 9.7* 9.5* 10.1* 9.5*  HCT 28.8* 28.5* 30.6* 28.9*  MCV 79.3 78.9 79.9 80.3  PLT 227 209 228 288   Blood Culture    Component Value Date/Time   SDES BLOOD RIGHT FOOT 12/06/2014 1602   SPECREQUEST BOTTLES DRAWN AEROBIC ONLY  5CC 12/06/2014 1602  CULT NO GROWTH 4 DAYS 12/06/2014 1602   REPTSTATUS PENDING 12/06/2014 1602    Cardiac Enzymes: No results for input(s): CKTOTAL, CKMB, CKMBINDEX, TROPONINI in the last 168 hours. CBG: No results for input(s): GLUCAP in the last 168 hours. Iron Studies: No results for input(s): IRON, TIBC, TRANSFERRIN, FERRITIN in the last 72 hours. @lablastinr3 @ Studies/Results: No results found. Medications:   . amLODipine  5 mg Oral QHS  . antiseptic oral rinse  7 mL Mouth Rinse q12n4p  . atorvastatin  40 mg Oral Daily  . calcium acetate  1,334 mg Oral TID WC  .  ceFAZolin (ANCEF) IV  1 g Intravenous Q24H  . chlorhexidine  15 mL Mouth Rinse BID  . cinacalcet  60 mg Oral Q breakfast  . colchicine  0.3 mg Oral Once per day on Mon Thu  . [START ON 12/13/2014] darbepoetin (ARANESP) injection - DIALYSIS  60 mcg Intravenous Q Wed-HD  .  feeding supplement (NEPRO CARB STEADY)  237 mL Oral BID BM  . heparin subcutaneous  5,000 Units Subcutaneous 3 times per day  . hydrALAZINE  50 mg Oral BID  . hydrocortisone cream   Topical BID  . losartan  25 mg Oral Daily  . metoprolol  100 mg Oral QHS  . mirtazapine  7.5 mg Oral Daily  . multivitamin  1 tablet Oral QHS  . pantoprazole  40 mg Oral Daily  . sevelamer carbonate  2.4 g Oral TID WC  . sodium chloride  3 mL Intravenous Q12H

## 2014-12-11 NOTE — Discharge Summary (Addendum)
Brad [name], is a 79 y.o. male  DOB 1935-10-31  MRN [id number].  Admission date:  12/05/2014  Admitting Physician  Lavina Hamman, MD  Discharge Date:  12/11/2014   Primary MD  No primary care provider on file.  Recommendations for primary care physician for things to follow:  - Patient to continue with vancomycin and Ancef for total of 8 weeks, stop date 01/30/2015 for lumbar spine discitis/osteoarthritis. - Patient to be seen by infectious disease clinic within 4 weeks, ID to arrange for appointment, Please call and schedule appointment if patient has not been contacted by ID clinic .   Admission Diagnosis  Osteomyelitis [M86.9]   Discharge Diagnosis  Osteomyelitis [M86.9]    Principal Problem:   Osteomyelitis Active Problems:   HYPERTENSION, BENIGN SYSTEMIC   ESRD on dialysis   Anemia of chronic renal failure, stage 5   Protein-calorie malnutrition, severe   Discitis   Pressure ulcer      Past Medical History  Diagnosis Date  . Hypertension   . High cholesterol   . Gout   . Anemia   . Renal disorder     End stage renal disease secondary to HTN nephrosclerosis  . Secondary hyperparathyroidism   . Hypocalcemia   . Colon polyps   . Paroxysmal supraventricular tachycardia   . Status post dilatation of esophageal stricture   . Syncope     Past Surgical History  Procedure Laterality Date  . Right arm amputation     . Revision of arteriovenous goretex graft Left 12/15/2012    Procedure: REVISION OF ARTERIOVENOUS GORETEX GRAFT using 37mm x 20cm Gortex graft;  Surgeon: Mal Misty, MD;  Location: [place];  Service: Vascular;  Laterality: Left;  . Esophagogastroduodenoscopy N/A 01/27/2014    Procedure: ESOPHAGOGASTRODUODENOSCOPY (EGD);  Surgeon: Beryle Beams, MD;  Location: [facility];  Service: Endoscopy;  Laterality: N/A;  . Esophageal dilation N/A 01/27/2014    Procedure:  ESOPHAGEAL DILATION;  Surgeon: Beryle Beams, MD;  Location: [facility];  Service: Endoscopy;  Laterality: N/A;  Balloon Dilation  . Esophagogastroduodenoscopy N/A 08/15/2014    Procedure: ESOPHAGOGASTRODUODENOSCOPY (EGD);  Surgeon: Carol Ada, MD;  Location: [facility] ENDOSCOPY;  Service: Endoscopy;  Laterality: N/A;  . Colonoscopy N/A 08/17/2014    Procedure: COLONOSCOPY;  Surgeon: Carol Ada, MD;  Location: [facility] ENDOSCOPY;  Service: Endoscopy;  Laterality: N/A;       History of present illness and  Hospital Course:     Kindly see H&P for history of present illness and admission details, please review complete Labs, Consult reports and Test reports for all details in brief  HPI  from the history and physical done on the day of admission  HPI: Brad [name] is a 79 y.o. male with Past medical history of ESRD on hemodialysis, hypertension, dyslipidemia, gout, GERD. The patient is presenting with compressive back pain. Patient mentions he had a fall few weeks ago and started having complaints of back pain. Has been progressively worsening. He was also recently seen  in the hospital for balanitis. He denies having any fever or chills denies having any nausea vomiting abdominal pain diarrhea constipation. He mentions he is compliant with all his medication and no recent change in his medication has been reported. With this he has seen orthopedic as an outpatient and had an MRI today. The MRI was showing possible osteomyelitis and therefore he was sent here for further workup.  The patient is coming from home.  At his baseline ambulates with support And is independent for most of his ADL manages his medication on his own.  Hospital Course   osteomyelitis/discitis of lumbar spine - As evident by MRI of lumbar spine done by orthopedic as an outpatient. - IR lumbar disc aspiration on 8/31. - on IV vancomycin and ceftazidime on 8/31 after blood cultures and aspiration has been done. -  Follow on aspiration culture and blood culture (no growth to date), D/W Dr Lucianne Lei dam from ID, changed ceftazidime to Ancef, and continue with IV vancomycin and Ancef for total of 8 weeks for treatment of her osteomyelitis/discitis, which can be given during hemodialysis. - ID follow with patient in 4 weeks after discharge regarding further decisions about antibiotics.  End-stage renal disease - Nephrology consulted, sating family also during hospital stay, required one session of extra hemodialysis giving evidence of respiratory failure secondary to volume overload.  Acute hypoxic respiratory failure - Patient with significant dyspnea/hypoxia 9/1, evidence of volume overload on chest x-ray, required hemodialysis  back-to-back 9/1, 9/2, significantly improved, denies any further dyspnea,  - Resolved hypoxic respiratory failure   Balanitis   History of gout - Continue with home medication  Essential hypertension  -continue with home medication.      Discharge Condition:   stable    Follow UP  Follow-up Information    Follow up with Bobby Rumpf, MD. Schedule an appointment as soon as possible for a visit in 4 weeks.   Specialty:  Infectious Diseases   Why:  For osteomyelitis/discitis follow-up.   Contact information:   [address] [place] [address] [phone]       Follow up with Marianna Payment, MD. Call in 3 weeks.   Specialty:  Orthopedic Surgery   Why:  Follow-up for lumbar discitis   Contact information:   [address] [place]  [address] [phone]         Discharge Instructions  and  Discharge Medications         Discharge Instructions    Discharge instructions    Complete by:  As directed   Follow with Primary MD after discharge from SNF    Activity: As tolerated with Full fall precautions use walker/cane & assistance as needed   Disposition SNF   Diet: Heart Healthy ,  renal diet with 1200 mL fluid  restriction,  feeding assistance and aspiration precautions.  For Heart failure patients - Check your Weight same time everyday, if you gain over 2 pounds, or you develop in leg swelling, experience more shortness of breath or chest pain, call your Primary MD immediately. Follow Cardiac Low Salt Diet and 1.5 lit/day fluid restriction.   On your next visit with your primary care physician please Get Medicines reviewed and adjusted.   Please request your Prim.MD to go over all Hospital Tests and Procedure/Radiological results at the follow up, please get all Hospital records sent to your Prim MD by signing hospital release before you go home.   If you experience worsening of  your admission symptoms, develop shortness of breath, life threatening emergency, suicidal or homicidal thoughts you must seek medical attention immediately by calling 911 or calling your MD immediately  if symptoms less severe.  You Must read complete instructions/literature along with all the possible adverse reactions/side effects for all the Medicines you take and that have been prescribed to you. Take any new Medicines after you have completely understood and accpet all the possible adverse reactions/side effects.   Do not drive, operating heavy machinery, perform activities at heights, swimming or participation in water activities or provide baby sitting services if your were admitted for syncope or siezures until you have seen by Primary MD or a Neurologist and advised to do so again.  Do not drive when taking Pain medications.    Do not take more than prescribed Pain, Sleep and Anxiety Medications  Special Instructions: If you have smoked or chewed Tobacco  in the last 2 yrs please stop smoking, stop any regular Alcohol  and or any Recreational drug use.  Wear Seat belts while driving.   Please note  You were cared for by a hospitalist during your hospital stay. If you have any questions about your discharge  medications or the care you received while you were in the hospital after you are discharged, you can call the unit and asked to speak with the hospitalist on call if the hospitalist that took care of you is not available. Once you are discharged, your primary care physician will handle any further medical issues. Please note that NO REFILLS for any discharge medications will be authorized once you are discharged, as it is imperative that you return to your primary care physician (or establish a relationship with a primary care physician if you do not have one) for your aftercare needs so that they can reassess your need for medications and monitor your lab values.     Increase activity slowly    Complete by:  As directed             Medication List    TAKE these medications        acetaminophen 500 MG tablet  Commonly known as:  TYLENOL  Take 1,000 mg by mouth every 6 (six) hours as needed for mild pain or moderate pain.     amLODipine 10 MG tablet  Commonly known as:  NORVASC  Take 10 mg by mouth at bedtime. For blood pressure     atorvastatin 40 MG tablet  Commonly known as:  LIPITOR  Take 40 mg by mouth daily.     calcium acetate 667 MG capsule  Commonly known as:  PHOSLO  Take 1,334 mg by mouth 3 (three) times daily with meals. And 1 cap with snack     ceFAZolin 2-3 GM-% Solr  Commonly known as:  ANCEF  Inject 50 mLs (2 g total) into the vein every Monday, Wednesday, and Friday with hemodialysis. Patient to continue with Ancef with hemodialysis Monday, Wednesday, Friday, anticipated stop date is 01/30/2015     clotrimazole 1 % cream  Commonly known as:  LOTRIMIN  Apply to affected area 2 times daily     colchicine 0.6 MG tablet  Take 0.6 mg by mouth 3 (three) times a week. Monday, wednesday, friday     DIALYVITE TABLET Tabs  Take 1 tablet by mouth daily.     feeding supplement (NEPRO CARB STEADY) Liqd  Take 237 mLs by mouth 2 (two) times daily between meals.  hydrALAZINE 50 MG tablet  Commonly known as:  APRESOLINE  Take 50 mg by mouth 2 (two) times daily.     losartan 25 MG tablet  Commonly known as:  COZAAR  Take 25 mg by mouth daily.     metoprolol 200 MG 24 hr tablet  Commonly known as:  TOPROL-XL  Take 200 mg by mouth at bedtime.     mirtazapine 7.5 MG tablet  Commonly known as:  REMERON  Take 7.5 mg by mouth daily.     mupirocin ointment 2 %  Commonly known as:  BACTROBAN  Place 1 application into the nose 2 (two) times daily.     omeprazole 20 MG capsule  Commonly known as:  PRILOSEC  Take 20 mg by mouth daily.     SENSIPAR 60 MG tablet  Generic drug:  cinacalcet  Take 60 mg by mouth daily.     traMADol 50 MG tablet  Commonly known as:  ULTRAM  Take 50 mg by mouth every 6 (six) hours as needed for moderate pain.     triamcinolone cream 0.1 %  Commonly known as:  KENALOG  Apply 1 application topically 2 (two) times daily.     vancomycin 500 mg in sodium chloride 0.9 % 100 mL  Inject 500 mg into the vein every Monday, Wednesday, and Friday with hemodialysis. continue with vancomycin during hemodialysis Monday Wednesday Friday, anticipated stop date is 01/30/2015          Diet and Activity recommendation: See Discharge Instructions above   Consults obtained - Infectious disease Nephrology Orthopedics   Major procedures and Radiology Reports - PLEASE review detailed and final reports for all details, in brief -  IR lumbar disc aspiration 12/06/14     Mr Lumbar Spine Wo Contrast  12/05/2014   CLINICAL DATA:  Low back pain with left hip pain, worsening for 3 months. Chronic renal failure on dialysis. History of gout.  EXAM: MRI LUMBAR SPINE WITHOUT CONTRAST  TECHNIQUE: Multiplanar, multisequence MR imaging of the lumbar spine was performed. No intravenous contrast was administered.  COMPARISON:  Right hip MRI 06/08/2013. Right hip radiographs 06/04/2013. No prior lumbar spine imaging.  FINDINGS: There is  straightening of the normal lumbar lordosis. There is no significant listhesis. Disc desiccation is present in the visualized lower thoracic spine and from L1-2 to L3-4. Multiple Schmorl's nodes are noted, including a prominent 1 involving the L3 superior endplate. There is no evidence of compression fracture.  There is marked disc space height loss at L4-5 with abnormal STIR hyperintensity in the disc space and erosive changes of the endplates. Prominent marrow edema is present throughout the L4 and L5 vertebral bodies with some extension into the pedicles. Marrow edema was present subjacent to the L5 superior endplate on the prior hip MRI but has progressed in the interim. Disc space collapse has progressed since the prior hip radiographs. There is circumferential displacement of low T2 signal material from the disc space. No paraspinal fluid collection or epidural abscess is seen.  The conus medullaris is normal in signal and terminates at T12. The kidneys are atrophic and contain numerous cysts, incompletely evaluated.  T12-L1:  Minimal disc bulging without stenosis.  L1-2:  Minimal disc bulging without stenosis.  L2-3:  Mild disc bulging without stenosis.  L3-4: Mild-to-moderate circumferential disc bulging and moderate facet hypertrophy result in mild spinal stenosis, moderate bilateral lateral recess stenosis, and mild bilateral neural foraminal stenosis.  L4-5: Circumferentially displaced material from the disc space,  centrally extruded disc material behind the L4 vertebral body, and severe facet hypertrophy result in mild spinal stenosis, severe bilateral lateral recess stenosis, and severe bilateral neural foraminal stenosis. A small right facet joint effusion is noted.  L5-S1: Mild disc bulging and facet hypertrophy result in mild bilateral lateral recess narrowing without spinal canal or neural foraminal stenosis.  IMPRESSION: 1. Destructive process centered at the L4-5 disc space which has progressed  from hip imaging performed in 06/2013 and is concerning for infectious discitis/osteomyelitis. Displaced disc material contributes to mild spinal stenosis and severe bilateral neural foraminal and lateral recess stenosis. No evidence of epidural or paravertebral abscess. Other potential etiologies, such as hemodialysis spondyloarthropathy and gout, are possible but not favored. 2. Mild-to-moderate disc and facet degeneration elsewhere as above. These results will be called to the ordering clinician or representative by the Radiologist Assistant, and communication documented in the PACS or zVision Dashboard.   Electronically Signed   By: Logan Bores M.D.   On: 12/05/2014 13:27   US Carotid Bilateral  11/28/2014   CLINICAL DATA:  Syncope.  EXAM: BILATERAL CAROTID DUPLEX ULTRASOUND  TECHNIQUE: Pearline Cables scale imaging, color Doppler and duplex ultrasound were performed of bilateral carotid and vertebral arteries in the neck.  COMPARISON:  None.  FINDINGS: Criteria: Quantification of carotid stenosis is based on velocity parameters that correlate the residual internal carotid diameter with NASCET-based stenosis levels, using the diameter of the distal internal carotid lumen as the denominator for stenosis measurement.  The following velocity measurements were obtained:  RIGHT  ICA:  85/23 cm/sec  CCA:  50/0 cm/sec  SYSTOLIC ICA/CCA RATIO:  1.0  DIASTOLIC ICA/CCA RATIO:  3.4  ECA:  54 cm/sec  LEFT  ICA:  113/13 cm/sec  CCA:  93/8 cm/sec  SYSTOLIC ICA/CCA RATIO:  1.6  DIASTOLIC ICA/CCA RATIO:  1.5  ECA:  107 cm/sec  RIGHT CAROTID ARTERY: Mild multifocal atherosclerotic plaque right carotid bifurcation. No flow limiting stenosis. Waveforms unremarkable.  RIGHT VERTEBRAL ARTERY: Patent with antegrade flow. Flow appears diminished.  LEFT CAROTID ARTERY: Mild multifocal atherosclerotic plaque left carotid bifurcation. No flow limiting stenosis. Waveforms unremarkable.  LEFT VERTEBRAL ARTERY: Patent with antegrade flow. Elevated  flow velocities in the left vertebral.  IMPRESSION: 1. Mild multifocal atherosclerotic vascular plaque both carotid bifurcations. No flow limiting stenosis. Degree of stenosis less than 50%. 2. Vertebral arteries are patent with antegrade flow. Antegrade flow is diminished in the right vertebral artery. Elevated flow velocities noted in the left vertebral artery . Findings suggestive the presence of bilateral subclavian and/or vertebral artery disease.   Electronically Signed   By: [place]   On: 11/28/2014 12:11   Dg Chest Port 1 View  12/07/2014   CLINICAL DATA:  Hypoxia.  End-stage renal disease on dialysis.  EXAM: PORTABLE CHEST - 1 VIEW  COMPARISON:  06/08/2013  FINDINGS: The patient is rotated to the right, partially limiting evaluation of the mediastinum. Cardiomediastinal silhouette is grossly unchanged, again with evidence of mild cardiomegaly and aortic tortuosity. There is new, mild pulmonary vascular congestion with indistinctness of the pulmonary vasculature and mild bilateral lung opacities which are greatest in the perihilar regions. No pleural effusion or pneumothorax is identified. No acute osseous abnormality is seen.  IMPRESSION: Mild cardiomegaly with new pulmonary vascular congestion and bilateral lung opacities suggestive of mild edema.   Electronically Signed   By: Logan Bores M.D.   On: 12/07/2014 14:09    Micro Results     Recent Results (from the  past 240 hour(s))  MRSA PCR Screening     Status: None   Collection Time: 12/06/14 12:59 AM  Result Value Ref Range Status   MRSA by PCR NEGATIVE NEGATIVE Final    Comment:        The GeneXpert MRSA Assay (FDA approved for NASAL specimens only), is one component of a comprehensive MRSA colonization surveillance program. It is not intended to diagnose MRSA infection nor to guide or monitor treatment for MRSA infections.   Fungus Culture with Smear     Status: None (Preliminary result)   Collection Time: 12/06/14   1:51 PM  Result Value Ref Range Status   Specimen Description WOUND BACK  Final   Special Requests 5 DISC SPACE L4  Final   Fungal Smear   Final    NO YEAST OR FUNGAL ELEMENTS SEEN Performed at [facility]    Culture   Final    CULTURE IN PROGRESS FOR FOUR WEEKS Performed at [facility]    Report Status PENDING  Incomplete  AFB culture with smear     Status: None (Preliminary result)   Collection Time: 12/06/14  1:51 PM  Result Value Ref Range Status   Specimen Description WOUND BACK  Final   Special Requests 5 DISC SPACE L4  Final   Acid Fast Smear   Final    NO ACID FAST BACILLI SEEN Performed at [facility]    Culture   Final    CULTURE WILL BE EXAMINED FOR 6 WEEKS BEFORE ISSUING A FINAL REPORT Performed at [facility]    Report Status PENDING  Incomplete  Wound culture     Status: None   Collection Time: 12/06/14  1:51 PM  Result Value Ref Range Status   Specimen Description WOUND BACK  Final   Special Requests 5 DISC SPACE L4  Final   Gram Stain   Final    RARE WBC PRESENT,BOTH PMN AND MONONUCLEAR NO SQUAMOUS EPITHELIAL CELLS SEEN NO ORGANISMS SEEN Performed at [facility]    Culture   Final    NO GROWTH 2 DAYS Performed at [facility]    Report Status 12/09/2014 FINAL  Final  Culture, blood (routine x 2)     Status: None   Collection Time: 12/06/14  3:55 PM  Result Value Ref Range Status   Specimen Description BLOOD LEFT FOOT  Final   Special Requests BOTTLES DRAWN AEROBIC ONLY  5CC  Final   Culture NO GROWTH 5 DAYS  Final   Report Status 12/11/2014 FINAL  Final  Culture, blood (routine x 2)     Status: None   Collection Time: 12/06/14  4:02 PM  Result Value Ref Range Status   Specimen Description BLOOD RIGHT FOOT  Final   Special Requests BOTTLES DRAWN AEROBIC ONLY  5CC  Final   Culture NO GROWTH 5 DAYS  Final   Report Status 12/11/2014 FINAL  Final       Today   Subjective:   [name] today has no headache,no chest abdominal pain,no new weakness tingling or numbness, feels much better today.   Objective:   Blood pressure 150/80, pulse 85, temperature 98.1 F (36.7 C), temperature source Oral, resp. rate 18, height 5\' 11"  (1.803 m), weight 53.6 kg (118 lb 2.7 oz), SpO2 98 %.   Intake/Output Summary (Last 24 hours) at 12/11/14 1544 Last data filed at 12/11/14 0842  Gross per 24 hour  Intake    460  ml  Output      0 ml  Net    460 ml    Exam Awake Alert, Oriented X 3,  [email or web address].AT,PERRAL Supple Neck,No JVD, No cervical lymphadenopathy appriciated.  Symmetrical Chest wall movement, Good air movement bilaterally,  RRR,No Gallops,Rubs or new Murmurs, No Parasternal Heave +ve B.Sounds, Abd Soft, No tenderness, No organomegaly appriciated, No Cyanosis, Clubbing or edema, right upper extremity amputated.   Data Review   CBC w Diff:  Lab Results  Component Value Date   WBC 9.1 12/11/2014   HGB 9.5* 12/11/2014   HCT 28.9* 12/11/2014   PLT 288 12/11/2014   LYMPHOPCT 14 12/05/2014   MONOPCT 13* 12/05/2014   EOSPCT 9* 12/05/2014   BASOPCT 1 12/05/2014    CMP:  Lab Results  Component Value Date   NA 136 12/11/2014   K 4.8 12/11/2014   CL 93* 12/11/2014   CO2 30 12/11/2014   BUN 44* 12/11/2014   CREATININE 8.87* 12/11/2014   CREATININE 9.26* 07/09/2010   PROT 7.1 12/06/2014   ALBUMIN 2.6* 12/06/2014   BILITOT 1.4* 12/06/2014   ALKPHOS 87 12/06/2014   AST 25 12/06/2014   ALT 16* 12/06/2014  .   Total Time in preparing paper work, data evaluation and todays exam - 35 minutes  Diamante Truszkowski M.D on 12/11/2014 at 3:44 PM  [facility]   Office  [phone]

## 2014-12-11 NOTE — Progress Notes (Signed)
ANTIBIOTIC CONSULT NOTE - FOLLOW UP  Pharmacy Consult for Vancomycin + Cefazolin Indication: L4-5 osteo/diskitis  No Known Allergies  Patient Measurements: Height: 5\' 11"  (180.3 cm) Weight: 118 lb 2.7 oz (53.6 kg) IBW/kg (Calculated) : 75.3  Vital Signs: Temp: 98.1 F (36.7 C) (09/05 1357) Temp Source: Oral (09/05 1357) BP: 150/80 mmHg (09/05 1500) Pulse Rate: 85 (09/05 1500) Intake/Output from previous day: 09/04 0701 - 09/05 0700 In: 700 [P.O.:700] Out: 0  Intake/Output from this shift: Total I/O In: 460 [P.O.:460] Out: 0   Labs:  Recent Labs  12/11/14 0452  WBC 9.1  HGB 9.5*  PLT 288  CREATININE 8.87*   Estimated Creatinine Clearance: 5.4 mL/min (by C-G formula based on Cr of 8.87).  Recent Labs  12/11/14 1436  VANCORANDOM 13     Microbiology: Recent Results (from the past 720 hour(s))  MRSA PCR Screening     Status: None   Collection Time: 12/06/14 12:59 AM  Result Value Ref Range Status   MRSA by PCR NEGATIVE NEGATIVE Final    Comment:        The GeneXpert MRSA Assay (FDA approved for NASAL specimens only), is one component of a comprehensive MRSA colonization surveillance program. It is not intended to diagnose MRSA infection nor to guide or monitor treatment for MRSA infections.   Fungus Culture with Smear     Status: None (Preliminary result)   Collection Time: 12/06/14  1:51 PM  Result Value Ref Range Status   Specimen Description WOUND BACK  Final   Special Requests 5 DISC SPACE L4  Final   Fungal Smear   Final    NO YEAST OR FUNGAL ELEMENTS SEEN Performed at [facility]    Culture   Final    CULTURE IN PROGRESS FOR FOUR WEEKS Performed at [facility]    Report Status PENDING  Incomplete  AFB culture with smear     Status: None (Preliminary result)   Collection Time: 12/06/14  1:51 PM  Result Value Ref Range Status   Specimen Description WOUND BACK  Final   Special Requests 5 DISC SPACE L4  Final   Acid Fast  Smear   Final    NO ACID FAST BACILLI SEEN Performed at [facility]    Culture   Final    CULTURE WILL BE EXAMINED FOR 6 WEEKS BEFORE ISSUING A FINAL REPORT Performed at [facility]    Report Status PENDING  Incomplete  Wound culture     Status: None   Collection Time: 12/06/14  1:51 PM  Result Value Ref Range Status   Specimen Description WOUND BACK  Final   Special Requests 5 DISC SPACE L4  Final   Gram Stain   Final    RARE WBC PRESENT,BOTH PMN AND MONONUCLEAR NO SQUAMOUS EPITHELIAL CELLS SEEN NO ORGANISMS SEEN Performed at [facility]    Culture   Final    NO GROWTH 2 DAYS Performed at [facility]    Report Status 12/09/2014 FINAL  Final  Culture, blood (routine x 2)     Status: None   Collection Time: 12/06/14  3:55 PM  Result Value Ref Range Status   Specimen Description BLOOD LEFT FOOT  Final   Special Requests BOTTLES DRAWN AEROBIC ONLY  5CC  Final   Culture NO GROWTH 5 DAYS  Final   Report Status 12/11/2014 FINAL  Final  Culture, blood (routine x 2)     Status: None  Collection Time: 12/06/14  4:02 PM  Result Value Ref Range Status   Specimen Description BLOOD RIGHT FOOT  Final   Special Requests BOTTLES DRAWN AEROBIC ONLY  5CC  Final   Culture NO GROWTH 5 DAYS  Final   Report Status 12/11/2014 FINAL  Final    Anti-infectives    Start     Dose/Rate Route Frequency Ordered Stop   12/11/14 0000  ceFAZolin (ANCEF) 1-5 GM-%     2 g 200 mL/hr over 30 Minutes Intravenous Every M-W-F (Hemodialysis) 12/11/14 1330 01/30/15 2359   12/11/14 0000  vancomycin 500 mg in sodium chloride 0.9 % 100 mL     500 mg 100 mL/hr over 60 Minutes Intravenous Every M-W-F (Hemodialysis) 12/11/14 1330 01/30/15 2359   12/10/14 1800  ceFAZolin (ANCEF) IVPB 1 g/50 mL premix     1 g 100 mL/hr over 30 Minutes Intravenous Every 24 hours 12/10/14 1155     12/08/14 1300  vancomycin (VANCOCIN) 500 mg in sodium chloride 0.9 % 100 mL IVPB     500 mg 100  mL/hr over 60 Minutes Intravenous  Once 12/08/14 1047 12/08/14 1307   12/07/14 2000  cefTAZidime (FORTAZ) 1 g in dextrose 5 % 50 mL IVPB  Status:  Discontinued     1 g 100 mL/hr over 30 Minutes Intravenous Every 24 hours 12/07/14 0915 12/10/14 1149   12/07/14 1200  vancomycin (VANCOCIN) 500 mg in sodium chloride 0.9 % 100 mL IVPB     500 mg 100 mL/hr over 60 Minutes Intravenous Every Thu (Hemodialysis) 12/06/14 1834 12/07/14 1116   12/07/14 1200  cefTAZidime (FORTAZ) 2 g in dextrose 5 % 50 mL IVPB  Status:  Discontinued     2 g 100 mL/hr over 30 Minutes Intravenous Every Thu (Hemodialysis) 12/06/14 1834 12/07/14 0915   12/06/14 1900  vancomycin (VANCOCIN) 1,250 mg in sodium chloride 0.9 % 250 mL IVPB     1,250 mg 166.7 mL/hr over 90 Minutes Intravenous  Once 12/06/14 1834 12/07/14 0141   12/06/14 1900  cefTAZidime (FORTAZ) 2 g in dextrose 5 % 50 mL IVPB     2 g 100 mL/hr over 30 Minutes Intravenous  Once 12/06/14 1834 12/06/14 2123      Assessment: 85 YOM with ESRD admitted on 8/30 after MRI revealed L4-5 osteo/diskitis. The patient underwent aspiration and culturing by IR on 8/31 followed by the start of Vancomycin + Ceftazidime. The patient did not receive HD on 8/31 and was dialyzed off-schedule (normally a MWF schedule) on 9/1 AM (3.5 hr BFR 400) and received an appropriate Vancomycin maintenance dose after that HD session. On 9/2, patient was to go for an extra HD session d/t volume overload. A dose was entered for patient to be given after HD, however RN on the floor gave it prior to HD. HD session was only 2h with BFR 400.  Pre HD vancomycin level is 13 and below goal of 15-25 mcg/ml. Will need 500 mg to keep pre HD level >15 mcg/ml.  No growth on intra-op cultures, antibiotics changed to vancomycin + cefazolin per Dr. Tommy Medal- plan is for 8 weeks of therapy.  WBC wnl, afeb.  Goal of Therapy:  Pre-HD Vancomycin level of 15-25 mcg/ml Proper antibiotics for infection/cultures  adjusted for renal/hepatic function   Plan:  - Vancomycin 500 mg after each full HD session MWF - Change cefazolin to 2 g IV after HD on MWF - Text paged Dr. Waldron Labs to update AVS with antibiotic dosing  [place], Pharm.D., BCPS Clinical Pharmacist Pager: [phone] 12/11/2014 3:28 PM

## 2014-12-11 NOTE — Care Management Important Message (Signed)
Important Message  Patient Details  Name: Brad [name] MRN: [id number] Date of Birth: 1935-11-15   Medicare Important Message Given:  Yes-third notification given    Loann Quill 12/11/2014, 9:18 AM

## 2014-12-12 DIAGNOSIS — I471 Supraventricular tachycardia: Secondary | ICD-10-CM | POA: Diagnosis not present

## 2014-12-12 DIAGNOSIS — M4626 Osteomyelitis of vertebra, lumbar region: Secondary | ICD-10-CM | POA: Diagnosis not present

## 2014-12-12 DIAGNOSIS — N186 End stage renal disease: Secondary | ICD-10-CM | POA: Diagnosis not present

## 2014-12-12 DIAGNOSIS — I10 Essential (primary) hypertension: Secondary | ICD-10-CM | POA: Diagnosis not present

## 2014-12-12 DIAGNOSIS — E46 Unspecified protein-calorie malnutrition: Secondary | ICD-10-CM | POA: Diagnosis not present

## 2014-12-13 DIAGNOSIS — Z5181 Encounter for therapeutic drug level monitoring: Secondary | ICD-10-CM | POA: Diagnosis not present

## 2014-12-13 DIAGNOSIS — D631 Anemia in chronic kidney disease: Secondary | ICD-10-CM | POA: Diagnosis not present

## 2014-12-13 DIAGNOSIS — M4646 Discitis, unspecified, lumbar region: Secondary | ICD-10-CM | POA: Diagnosis not present

## 2014-12-13 DIAGNOSIS — N2581 Secondary hyperparathyroidism of renal origin: Secondary | ICD-10-CM | POA: Diagnosis not present

## 2014-12-13 DIAGNOSIS — N186 End stage renal disease: Secondary | ICD-10-CM | POA: Diagnosis not present

## 2014-12-13 DIAGNOSIS — R52 Pain, unspecified: Secondary | ICD-10-CM | POA: Diagnosis not present

## 2014-12-15 DIAGNOSIS — D631 Anemia in chronic kidney disease: Secondary | ICD-10-CM | POA: Diagnosis not present

## 2014-12-15 DIAGNOSIS — N2581 Secondary hyperparathyroidism of renal origin: Secondary | ICD-10-CM | POA: Diagnosis not present

## 2014-12-15 DIAGNOSIS — R52 Pain, unspecified: Secondary | ICD-10-CM | POA: Diagnosis not present

## 2014-12-15 DIAGNOSIS — M4646 Discitis, unspecified, lumbar region: Secondary | ICD-10-CM | POA: Diagnosis not present

## 2014-12-15 DIAGNOSIS — Z5181 Encounter for therapeutic drug level monitoring: Secondary | ICD-10-CM | POA: Diagnosis not present

## 2014-12-15 DIAGNOSIS — N186 End stage renal disease: Secondary | ICD-10-CM | POA: Diagnosis not present

## 2014-12-18 DIAGNOSIS — N186 End stage renal disease: Secondary | ICD-10-CM | POA: Diagnosis not present

## 2014-12-18 DIAGNOSIS — N2581 Secondary hyperparathyroidism of renal origin: Secondary | ICD-10-CM | POA: Diagnosis not present

## 2014-12-18 DIAGNOSIS — M4646 Discitis, unspecified, lumbar region: Secondary | ICD-10-CM | POA: Diagnosis not present

## 2014-12-18 DIAGNOSIS — D631 Anemia in chronic kidney disease: Secondary | ICD-10-CM | POA: Diagnosis not present

## 2014-12-18 DIAGNOSIS — R52 Pain, unspecified: Secondary | ICD-10-CM | POA: Diagnosis not present

## 2014-12-18 DIAGNOSIS — Z5181 Encounter for therapeutic drug level monitoring: Secondary | ICD-10-CM | POA: Diagnosis not present

## 2014-12-20 DIAGNOSIS — D649 Anemia, unspecified: Secondary | ICD-10-CM | POA: Diagnosis not present

## 2014-12-20 DIAGNOSIS — N186 End stage renal disease: Secondary | ICD-10-CM | POA: Diagnosis not present

## 2014-12-20 DIAGNOSIS — Z5181 Encounter for therapeutic drug level monitoring: Secondary | ICD-10-CM | POA: Diagnosis not present

## 2014-12-20 DIAGNOSIS — N2581 Secondary hyperparathyroidism of renal origin: Secondary | ICD-10-CM | POA: Diagnosis not present

## 2014-12-20 DIAGNOSIS — R52 Pain, unspecified: Secondary | ICD-10-CM | POA: Diagnosis not present

## 2014-12-20 DIAGNOSIS — M109 Gout, unspecified: Secondary | ICD-10-CM | POA: Diagnosis not present

## 2014-12-20 DIAGNOSIS — D631 Anemia in chronic kidney disease: Secondary | ICD-10-CM | POA: Diagnosis not present

## 2014-12-20 DIAGNOSIS — M4646 Discitis, unspecified, lumbar region: Secondary | ICD-10-CM | POA: Diagnosis not present

## 2014-12-20 DIAGNOSIS — I120 Hypertensive chronic kidney disease with stage 5 chronic kidney disease or end stage renal disease: Secondary | ICD-10-CM | POA: Diagnosis not present

## 2014-12-20 DIAGNOSIS — I10 Essential (primary) hypertension: Secondary | ICD-10-CM | POA: Diagnosis not present

## 2014-12-22 DIAGNOSIS — N2581 Secondary hyperparathyroidism of renal origin: Secondary | ICD-10-CM | POA: Diagnosis not present

## 2014-12-22 DIAGNOSIS — N186 End stage renal disease: Secondary | ICD-10-CM | POA: Diagnosis not present

## 2014-12-22 DIAGNOSIS — Z5181 Encounter for therapeutic drug level monitoring: Secondary | ICD-10-CM | POA: Diagnosis not present

## 2014-12-22 DIAGNOSIS — R52 Pain, unspecified: Secondary | ICD-10-CM | POA: Diagnosis not present

## 2014-12-22 DIAGNOSIS — M4646 Discitis, unspecified, lumbar region: Secondary | ICD-10-CM | POA: Diagnosis not present

## 2014-12-22 DIAGNOSIS — D631 Anemia in chronic kidney disease: Secondary | ICD-10-CM | POA: Diagnosis not present

## 2014-12-25 DIAGNOSIS — R52 Pain, unspecified: Secondary | ICD-10-CM | POA: Diagnosis not present

## 2014-12-25 DIAGNOSIS — Z5181 Encounter for therapeutic drug level monitoring: Secondary | ICD-10-CM | POA: Diagnosis not present

## 2014-12-25 DIAGNOSIS — D631 Anemia in chronic kidney disease: Secondary | ICD-10-CM | POA: Diagnosis not present

## 2014-12-25 DIAGNOSIS — N2581 Secondary hyperparathyroidism of renal origin: Secondary | ICD-10-CM | POA: Diagnosis not present

## 2014-12-25 DIAGNOSIS — N186 End stage renal disease: Secondary | ICD-10-CM | POA: Diagnosis not present

## 2014-12-25 DIAGNOSIS — M4646 Discitis, unspecified, lumbar region: Secondary | ICD-10-CM | POA: Diagnosis not present

## 2014-12-27 DIAGNOSIS — M4646 Discitis, unspecified, lumbar region: Secondary | ICD-10-CM | POA: Diagnosis not present

## 2014-12-27 DIAGNOSIS — N186 End stage renal disease: Secondary | ICD-10-CM | POA: Diagnosis not present

## 2014-12-27 DIAGNOSIS — R52 Pain, unspecified: Secondary | ICD-10-CM | POA: Diagnosis not present

## 2014-12-27 DIAGNOSIS — D631 Anemia in chronic kidney disease: Secondary | ICD-10-CM | POA: Diagnosis not present

## 2014-12-27 DIAGNOSIS — N2581 Secondary hyperparathyroidism of renal origin: Secondary | ICD-10-CM | POA: Diagnosis not present

## 2014-12-27 DIAGNOSIS — Z5181 Encounter for therapeutic drug level monitoring: Secondary | ICD-10-CM | POA: Diagnosis not present

## 2014-12-29 DIAGNOSIS — D631 Anemia in chronic kidney disease: Secondary | ICD-10-CM | POA: Diagnosis not present

## 2014-12-29 DIAGNOSIS — N2581 Secondary hyperparathyroidism of renal origin: Secondary | ICD-10-CM | POA: Diagnosis not present

## 2014-12-29 DIAGNOSIS — M4646 Discitis, unspecified, lumbar region: Secondary | ICD-10-CM | POA: Diagnosis not present

## 2014-12-29 DIAGNOSIS — R52 Pain, unspecified: Secondary | ICD-10-CM | POA: Diagnosis not present

## 2014-12-29 DIAGNOSIS — N186 End stage renal disease: Secondary | ICD-10-CM | POA: Diagnosis not present

## 2014-12-29 DIAGNOSIS — Z5181 Encounter for therapeutic drug level monitoring: Secondary | ICD-10-CM | POA: Diagnosis not present

## 2015-01-01 DIAGNOSIS — N2581 Secondary hyperparathyroidism of renal origin: Secondary | ICD-10-CM | POA: Diagnosis not present

## 2015-01-01 DIAGNOSIS — Z5181 Encounter for therapeutic drug level monitoring: Secondary | ICD-10-CM | POA: Diagnosis not present

## 2015-01-01 DIAGNOSIS — R52 Pain, unspecified: Secondary | ICD-10-CM | POA: Diagnosis not present

## 2015-01-01 DIAGNOSIS — M4646 Discitis, unspecified, lumbar region: Secondary | ICD-10-CM | POA: Diagnosis not present

## 2015-01-01 DIAGNOSIS — N186 End stage renal disease: Secondary | ICD-10-CM | POA: Diagnosis not present

## 2015-01-01 DIAGNOSIS — D631 Anemia in chronic kidney disease: Secondary | ICD-10-CM | POA: Diagnosis not present

## 2015-01-02 DIAGNOSIS — I471 Supraventricular tachycardia: Secondary | ICD-10-CM | POA: Diagnosis not present

## 2015-01-02 DIAGNOSIS — E43 Unspecified severe protein-calorie malnutrition: Secondary | ICD-10-CM | POA: Diagnosis not present

## 2015-01-02 DIAGNOSIS — N481 Balanitis: Secondary | ICD-10-CM | POA: Diagnosis not present

## 2015-01-02 DIAGNOSIS — R2689 Other abnormalities of gait and mobility: Secondary | ICD-10-CM | POA: Diagnosis not present

## 2015-01-02 DIAGNOSIS — M869 Osteomyelitis, unspecified: Secondary | ICD-10-CM | POA: Diagnosis not present

## 2015-01-02 DIAGNOSIS — K219 Gastro-esophageal reflux disease without esophagitis: Secondary | ICD-10-CM | POA: Diagnosis not present

## 2015-01-02 DIAGNOSIS — R21 Rash and other nonspecific skin eruption: Secondary | ICD-10-CM | POA: Diagnosis not present

## 2015-01-02 DIAGNOSIS — M545 Low back pain: Secondary | ICD-10-CM | POA: Diagnosis not present

## 2015-01-02 DIAGNOSIS — I120 Hypertensive chronic kidney disease with stage 5 chronic kidney disease or end stage renal disease: Secondary | ICD-10-CM | POA: Diagnosis not present

## 2015-01-02 DIAGNOSIS — M109 Gout, unspecified: Secondary | ICD-10-CM | POA: Diagnosis not present

## 2015-01-02 DIAGNOSIS — N186 End stage renal disease: Secondary | ICD-10-CM | POA: Diagnosis not present

## 2015-01-02 DIAGNOSIS — M6281 Muscle weakness (generalized): Secondary | ICD-10-CM | POA: Diagnosis not present

## 2015-01-02 DIAGNOSIS — M25552 Pain in left hip: Secondary | ICD-10-CM | POA: Diagnosis not present

## 2015-01-02 DIAGNOSIS — D631 Anemia in chronic kidney disease: Secondary | ICD-10-CM | POA: Diagnosis not present

## 2015-01-03 DIAGNOSIS — Z5181 Encounter for therapeutic drug level monitoring: Secondary | ICD-10-CM | POA: Diagnosis not present

## 2015-01-03 DIAGNOSIS — N186 End stage renal disease: Secondary | ICD-10-CM | POA: Diagnosis not present

## 2015-01-03 DIAGNOSIS — M4646 Discitis, unspecified, lumbar region: Secondary | ICD-10-CM | POA: Diagnosis not present

## 2015-01-03 DIAGNOSIS — R52 Pain, unspecified: Secondary | ICD-10-CM | POA: Diagnosis not present

## 2015-01-03 DIAGNOSIS — D631 Anemia in chronic kidney disease: Secondary | ICD-10-CM | POA: Diagnosis not present

## 2015-01-03 DIAGNOSIS — N2581 Secondary hyperparathyroidism of renal origin: Secondary | ICD-10-CM | POA: Diagnosis not present

## 2015-01-04 DIAGNOSIS — N481 Balanitis: Secondary | ICD-10-CM | POA: Diagnosis not present

## 2015-01-04 DIAGNOSIS — M109 Gout, unspecified: Secondary | ICD-10-CM | POA: Diagnosis not present

## 2015-01-04 DIAGNOSIS — D631 Anemia in chronic kidney disease: Secondary | ICD-10-CM | POA: Diagnosis not present

## 2015-01-04 DIAGNOSIS — M6281 Muscle weakness (generalized): Secondary | ICD-10-CM | POA: Diagnosis not present

## 2015-01-04 DIAGNOSIS — R21 Rash and other nonspecific skin eruption: Secondary | ICD-10-CM | POA: Diagnosis not present

## 2015-01-04 DIAGNOSIS — M869 Osteomyelitis, unspecified: Secondary | ICD-10-CM | POA: Diagnosis not present

## 2015-01-04 DIAGNOSIS — K219 Gastro-esophageal reflux disease without esophagitis: Secondary | ICD-10-CM | POA: Diagnosis not present

## 2015-01-04 DIAGNOSIS — R2689 Other abnormalities of gait and mobility: Secondary | ICD-10-CM | POA: Diagnosis not present

## 2015-01-04 DIAGNOSIS — I120 Hypertensive chronic kidney disease with stage 5 chronic kidney disease or end stage renal disease: Secondary | ICD-10-CM | POA: Diagnosis not present

## 2015-01-04 DIAGNOSIS — M545 Low back pain: Secondary | ICD-10-CM | POA: Diagnosis not present

## 2015-01-04 DIAGNOSIS — N186 End stage renal disease: Secondary | ICD-10-CM | POA: Diagnosis not present

## 2015-01-04 DIAGNOSIS — I471 Supraventricular tachycardia: Secondary | ICD-10-CM | POA: Diagnosis not present

## 2015-01-04 DIAGNOSIS — E43 Unspecified severe protein-calorie malnutrition: Secondary | ICD-10-CM | POA: Diagnosis not present

## 2015-01-05 DIAGNOSIS — N2581 Secondary hyperparathyroidism of renal origin: Secondary | ICD-10-CM | POA: Diagnosis not present

## 2015-01-05 DIAGNOSIS — Z5181 Encounter for therapeutic drug level monitoring: Secondary | ICD-10-CM | POA: Diagnosis not present

## 2015-01-05 DIAGNOSIS — D631 Anemia in chronic kidney disease: Secondary | ICD-10-CM | POA: Diagnosis not present

## 2015-01-05 DIAGNOSIS — M4646 Discitis, unspecified, lumbar region: Secondary | ICD-10-CM | POA: Diagnosis not present

## 2015-01-05 DIAGNOSIS — Z992 Dependence on renal dialysis: Secondary | ICD-10-CM | POA: Diagnosis not present

## 2015-01-05 DIAGNOSIS — I120 Hypertensive chronic kidney disease with stage 5 chronic kidney disease or end stage renal disease: Secondary | ICD-10-CM | POA: Diagnosis not present

## 2015-01-05 DIAGNOSIS — N186 End stage renal disease: Secondary | ICD-10-CM | POA: Diagnosis not present

## 2015-01-05 DIAGNOSIS — R52 Pain, unspecified: Secondary | ICD-10-CM | POA: Diagnosis not present

## 2015-01-05 LAB — FUNGUS CULTURE W SMEAR
FUNGAL SMEAR: NONE SEEN
Special Requests: 5

## 2015-01-08 DIAGNOSIS — Z5181 Encounter for therapeutic drug level monitoring: Secondary | ICD-10-CM | POA: Diagnosis not present

## 2015-01-08 DIAGNOSIS — M4646 Discitis, unspecified, lumbar region: Secondary | ICD-10-CM | POA: Diagnosis not present

## 2015-01-08 DIAGNOSIS — Z23 Encounter for immunization: Secondary | ICD-10-CM | POA: Diagnosis not present

## 2015-01-08 DIAGNOSIS — N186 End stage renal disease: Secondary | ICD-10-CM | POA: Diagnosis not present

## 2015-01-08 DIAGNOSIS — D631 Anemia in chronic kidney disease: Secondary | ICD-10-CM | POA: Diagnosis not present

## 2015-01-08 DIAGNOSIS — R52 Pain, unspecified: Secondary | ICD-10-CM | POA: Diagnosis not present

## 2015-01-09 ENCOUNTER — Ambulatory Visit (INDEPENDENT_AMBULATORY_CARE_PROVIDER_SITE_OTHER): Payer: Medicare Other | Admitting: Infectious Disease

## 2015-01-09 ENCOUNTER — Encounter: Payer: Self-pay | Admitting: Infectious Disease

## 2015-01-09 VITALS — BP 198/98 | HR 75 | Temp 98.1°F | Ht 70.0 in | Wt 116.0 lb

## 2015-01-09 DIAGNOSIS — L8990 Pressure ulcer of unspecified site, unspecified stage: Secondary | ICD-10-CM

## 2015-01-09 DIAGNOSIS — R630 Anorexia: Secondary | ICD-10-CM

## 2015-01-09 DIAGNOSIS — I471 Supraventricular tachycardia: Secondary | ICD-10-CM | POA: Diagnosis not present

## 2015-01-09 DIAGNOSIS — E43 Unspecified severe protein-calorie malnutrition: Secondary | ICD-10-CM | POA: Diagnosis not present

## 2015-01-09 DIAGNOSIS — I120 Hypertensive chronic kidney disease with stage 5 chronic kidney disease or end stage renal disease: Secondary | ICD-10-CM | POA: Diagnosis not present

## 2015-01-09 DIAGNOSIS — M4646 Discitis, unspecified, lumbar region: Secondary | ICD-10-CM

## 2015-01-09 DIAGNOSIS — N481 Balanitis: Secondary | ICD-10-CM | POA: Diagnosis not present

## 2015-01-09 DIAGNOSIS — R21 Rash and other nonspecific skin eruption: Secondary | ICD-10-CM

## 2015-01-09 DIAGNOSIS — M6281 Muscle weakness (generalized): Secondary | ICD-10-CM | POA: Diagnosis not present

## 2015-01-09 DIAGNOSIS — R634 Abnormal weight loss: Secondary | ICD-10-CM

## 2015-01-09 DIAGNOSIS — M109 Gout, unspecified: Secondary | ICD-10-CM | POA: Diagnosis not present

## 2015-01-09 DIAGNOSIS — D631 Anemia in chronic kidney disease: Secondary | ICD-10-CM | POA: Diagnosis not present

## 2015-01-09 DIAGNOSIS — N186 End stage renal disease: Secondary | ICD-10-CM

## 2015-01-09 DIAGNOSIS — K219 Gastro-esophageal reflux disease without esophagitis: Secondary | ICD-10-CM | POA: Diagnosis not present

## 2015-01-09 DIAGNOSIS — Z992 Dependence on renal dialysis: Secondary | ICD-10-CM

## 2015-01-09 DIAGNOSIS — I10 Essential (primary) hypertension: Secondary | ICD-10-CM

## 2015-01-09 DIAGNOSIS — M545 Low back pain: Secondary | ICD-10-CM | POA: Diagnosis not present

## 2015-01-09 DIAGNOSIS — M869 Osteomyelitis, unspecified: Secondary | ICD-10-CM | POA: Diagnosis not present

## 2015-01-09 DIAGNOSIS — R2689 Other abnormalities of gait and mobility: Secondary | ICD-10-CM | POA: Diagnosis not present

## 2015-01-09 HISTORY — DX: Abnormal weight loss: R63.4

## 2015-01-09 HISTORY — DX: Anorexia: R63.0

## 2015-01-09 HISTORY — DX: Rash and other nonspecific skin eruption: R21

## 2015-01-09 LAB — C-REACTIVE PROTEIN: CRP: 3 mg/dL — ABNORMAL HIGH (ref <0.60)

## 2015-01-09 NOTE — Progress Notes (Signed)
Chief complaint: hip pain and new rash Subjective:    Patient ID: Brad [name], male    DOB: 1935-05-02, 79 y.o.   MRN: [id number]  HPI  79 yo M with hx of ESRD x 8 yrs, R arm amputation due to work injury, and several months of worsening low back precipitated by a fall. He has had no f/c at home.  He had no f/c at home. He had outpt MRI on 8-30 which showed: Marland Kitchen Destructive process centered at the L4-5 disc space which has progressed from hip imaging performed in 06/2013 and is concerning for infectious discitis/osteomyelitis. Displaced disc material contributes to mild spinal stenosis and severe bilateral neural foraminal and lateral recess stenosis. No evidence of epidural or paravertebral abscess. Other potential etiologies, such as hemodialysis spondyloarthropathy and gout, are possible but not Favored. 2. Mild-to-moderate disc and facet degeneration elsewhere as above. He was sent to hospital after these findings.  He underwent IR guided aspirate on 8-31 (g/s -). Also had BCx sent. He has been started on ceftaz/vanco, but no organism grew. I had him changed to vancomycin and cefazolin with HD and he has remained on these since then.  He is here for follow-up with his wife. He thinks his left hip  pain is slightly better, mainly hurting when he tries to walk. He has suffered from chronic pain in his left side  And also with a rash on her buttocks. That I examined. HE is underweight and not eating well.   Past Medical History  Diagnosis Date  . Hypertension   . High cholesterol   . Gout   . Anemia   . Renal disorder     End stage renal disease secondary to HTN nephrosclerosis  . Secondary hyperparathyroidism   . Hypocalcemia   . Colon polyps   . Paroxysmal supraventricular tachycardia   . Status post dilatation of esophageal stricture   . Syncope     Past Surgical History  Procedure Laterality Date  . Right arm amputation     . Revision of arteriovenous goretex graft  Left 12/15/2012    Procedure: REVISION OF ARTERIOVENOUS GORETEX GRAFT using 43m x 20cm Gortex graft;  Surgeon: JMal Misty MD;  Location: [facility]  Service: Vascular;  Laterality: Left;  . Esophagogastroduodenoscopy N/A 01/27/2014    Procedure: ESOPHAGOGASTRODUODENOSCOPY (EGD);  Surgeon: PBeryle Beams MD;  [facility];  Service: Endoscopy;  Laterality: N/A;  . Esophageal dilation N/A 01/27/2014    Procedure: ESOPHAGEAL DILATION;  Surgeon: PBeryle Beams MD;  Location: [facility];  Service: Endoscopy;  Laterality: N/A;  Balloon Dilation  . Esophagogastroduodenoscopy N/A 08/15/2014    Procedure: ESOPHAGOGASTRODUODENOSCOPY (EGD);  Surgeon: PCarol Ada MD;  [facility];  Service: Endoscopy;  Laterality: N/A;  . Colonoscopy N/A 08/17/2014    Procedure: COLONOSCOPY;  Surgeon: PCarol Ada MD;  [facility];  Service: Endoscopy;  Laterality: N/A;    No family history on file.  No history of hearing loss while on antibiotics   Social History   Social History  . Marital Status: Married    Spouse Name: N/A  . Number of Children: N/A  . Years of Education: N/A   Social History Main Topics  . Smoking status: Never Smoker   . Smokeless tobacco: Not on file  . Alcohol Use: No  . Drug Use: No  . Sexual Activity: Not on file   Other Topics Concern  . Not on file   Social History Narrative  No Known Allergies   Current outpatient prescriptions:  .  acetaminophen (TYLENOL) 500 MG tablet, Take 1,000 mg by mouth every 6 (six) hours as needed for mild pain or moderate pain., Disp: , Rfl:  .  amLODipine (NORVASC) 10 MG tablet, Take 10 mg by mouth at bedtime. For blood pressure , Disp: , Rfl:  .  atorvastatin (LIPITOR) 40 MG tablet, Take 40 mg by mouth daily. , Disp: , Rfl:  .  B Complex-C-Folic Acid (DIALYVITE TABLET) TABS, Take 1 tablet by mouth daily.  , Disp: , Rfl:  .  calcium acetate (PHOSLO) 667 MG capsule, Take 1,334 mg by mouth 3 (three) times  daily with meals. And 1 cap with snack, Disp: , Rfl:  .  ceFAZolin (ANCEF) 2-3 GM-% SOLR, Inject 50 mLs (2 g total) into the vein every Monday, Wednesday, and Friday with hemodialysis. Patient to continue with Ancef with hemodialysis Monday, Wednesday, Friday, anticipated stop date is 01/30/2015, Disp: , Rfl:  .  colchicine 0.6 MG tablet, Take 0.6 mg by mouth 3 (three) times a week. Monday, wednesday, friday, Disp: , Rfl:  .  losartan (COZAAR) 25 MG tablet, Take 25 mg by mouth daily., Disp: , Rfl:  .  metoprolol (TOPROL-XL) 200 MG 24 hr tablet, Take 200 mg by mouth at bedtime. , Disp: , Rfl:  .  mirtazapine (REMERON) 7.5 MG tablet, Take 7.5 mg by mouth daily., Disp: , Rfl:  .  omeprazole (PRILOSEC) 20 MG capsule, Take 20 mg by mouth daily.  , Disp: , Rfl:  .  SENSIPAR 60 MG tablet, Take 60 mg by mouth daily., Disp: , Rfl:  .  traMADol (ULTRAM) 50 MG tablet, Take 50 mg by mouth every 6 (six) hours as needed for moderate pain. , Disp: , Rfl:  .  triamcinolone cream (KENALOG) 0.1 %, Apply 1 application topically 2 (two) times daily. , Disp: , Rfl:  .  mupirocin ointment (BACTROBAN) 2 %, Place 1 application into the nose 2 (two) times daily. (Patient not taking: Reported on 01/09/2015), Disp: 22 g, Rfl: 0 .  Nutritional Supplements (FEEDING SUPPLEMENT, NEPRO CARB STEADY,) LIQD, Take 237 mLs by mouth 2 (two) times daily between meals. (Patient not taking: Reported on 01/09/2015), Disp: , Rfl: 0   Review of Systems  Constitutional: Negative for fever, chills, diaphoresis, activity change, appetite change, fatigue and unexpected weight change.  HENT: Negative for congestion, rhinorrhea, sinus pressure, sneezing, sore throat and trouble swallowing.   Eyes: Negative for photophobia and visual disturbance.  Respiratory: Negative for cough, chest tightness, shortness of breath, wheezing and stridor.   Cardiovascular: Negative for chest pain, palpitations and leg swelling.  Gastrointestinal: Negative for  nausea, vomiting, abdominal pain, diarrhea, constipation, blood in stool, abdominal distention and anal bleeding.  Genitourinary: Negative for dysuria, hematuria, flank pain and difficulty urinating.  Musculoskeletal: Negative for myalgias, back pain, joint swelling, arthralgias and gait problem.  Skin: Negative for color change, pallor, rash and wound.  Neurological: Negative for dizziness, tremors, weakness and light-headedness.  Hematological: Negative for adenopathy. Does not bruise/bleed easily.  Psychiatric/Behavioral: Negative for behavioral problems, confusion, sleep disturbance, dysphoric mood, decreased concentration and agitation.       Objective:   Physical Exam  Constitutional: He is oriented to person, place, and time. He appears well-developed and well-nourished.  HENT:  Head: Normocephalic and atraumatic.  Eyes: Conjunctivae and EOM are normal.  Neck: Normal range of motion. Neck supple.  Cardiovascular: Normal rate and regular rhythm.   Pulmonary/Chest: Effort normal.  No respiratory distress. He has no wheezes.  Abdominal: Soft. He exhibits no distension.  Musculoskeletal: Normal range of motion. He exhibits no edema or tenderness.  Neurological: He is alert and oriented to person, place, and time.  Skin: Skin is warm and dry. No rash noted. No erythema. No pallor.  Psychiatric: He has a normal mood and affect. His speech is normal and behavior is normal. Cognition and memory are impaired.   MSK: right arm amputation site clean  Skin with rash 01/09/15:    Foot with ulcer that has healed:           Assessment & Plan:   Diskitis: check ESR, CRP, continue ancef and vancomycin for 8 weeks post disk aspiration with followup in ID clinic to see how he is doing clinically prior to stopping agx  Rash: not clear what is the cause of this monitor. Not due to abx  HTN: BP nearly 395 systolic, asymptomatic. He should be followed by PCP and with HD  Underweight:  likely due to infection and inflammation  I spent greater than 25 minutes with the patient including greater than 50% of time in face to face counsel of the patient and his wife re his diskitis, rash, and poor appetite and in coordination of their care.

## 2015-01-09 NOTE — Patient Instructions (Signed)
YOU NEED TO REMAIN ON THE 2 ANTIBIOTICS BEING GIVEN WITH DIALYSIS FOR ANOTHER MONTH AND BE SEEN BY DR. Lucianne Lei DAM OR ONE OF HIS PARTNERS PRIOR TO STOPPING THEM

## 2015-01-10 DIAGNOSIS — M4646 Discitis, unspecified, lumbar region: Secondary | ICD-10-CM | POA: Diagnosis not present

## 2015-01-10 DIAGNOSIS — Z5181 Encounter for therapeutic drug level monitoring: Secondary | ICD-10-CM | POA: Diagnosis not present

## 2015-01-10 DIAGNOSIS — N186 End stage renal disease: Secondary | ICD-10-CM | POA: Diagnosis not present

## 2015-01-10 DIAGNOSIS — D631 Anemia in chronic kidney disease: Secondary | ICD-10-CM | POA: Diagnosis not present

## 2015-01-10 DIAGNOSIS — Z23 Encounter for immunization: Secondary | ICD-10-CM | POA: Diagnosis not present

## 2015-01-10 DIAGNOSIS — R52 Pain, unspecified: Secondary | ICD-10-CM | POA: Diagnosis not present

## 2015-01-10 LAB — SEDIMENTATION RATE: Sed Rate: 77 mm/hr — ABNORMAL HIGH (ref 0–20)

## 2015-01-11 DIAGNOSIS — N186 End stage renal disease: Secondary | ICD-10-CM | POA: Diagnosis not present

## 2015-01-11 DIAGNOSIS — N481 Balanitis: Secondary | ICD-10-CM | POA: Diagnosis not present

## 2015-01-11 DIAGNOSIS — M869 Osteomyelitis, unspecified: Secondary | ICD-10-CM | POA: Diagnosis not present

## 2015-01-11 DIAGNOSIS — M109 Gout, unspecified: Secondary | ICD-10-CM | POA: Diagnosis not present

## 2015-01-11 DIAGNOSIS — E43 Unspecified severe protein-calorie malnutrition: Secondary | ICD-10-CM | POA: Diagnosis not present

## 2015-01-11 DIAGNOSIS — D631 Anemia in chronic kidney disease: Secondary | ICD-10-CM | POA: Diagnosis not present

## 2015-01-11 DIAGNOSIS — M6281 Muscle weakness (generalized): Secondary | ICD-10-CM | POA: Diagnosis not present

## 2015-01-11 DIAGNOSIS — K219 Gastro-esophageal reflux disease without esophagitis: Secondary | ICD-10-CM | POA: Diagnosis not present

## 2015-01-11 DIAGNOSIS — I471 Supraventricular tachycardia: Secondary | ICD-10-CM | POA: Diagnosis not present

## 2015-01-11 DIAGNOSIS — R21 Rash and other nonspecific skin eruption: Secondary | ICD-10-CM | POA: Diagnosis not present

## 2015-01-11 DIAGNOSIS — I120 Hypertensive chronic kidney disease with stage 5 chronic kidney disease or end stage renal disease: Secondary | ICD-10-CM | POA: Diagnosis not present

## 2015-01-11 DIAGNOSIS — M545 Low back pain: Secondary | ICD-10-CM | POA: Diagnosis not present

## 2015-01-11 DIAGNOSIS — R2689 Other abnormalities of gait and mobility: Secondary | ICD-10-CM | POA: Diagnosis not present

## 2015-01-12 DIAGNOSIS — Z5181 Encounter for therapeutic drug level monitoring: Secondary | ICD-10-CM | POA: Diagnosis not present

## 2015-01-12 DIAGNOSIS — N186 End stage renal disease: Secondary | ICD-10-CM | POA: Diagnosis not present

## 2015-01-12 DIAGNOSIS — D631 Anemia in chronic kidney disease: Secondary | ICD-10-CM | POA: Diagnosis not present

## 2015-01-12 DIAGNOSIS — Z23 Encounter for immunization: Secondary | ICD-10-CM | POA: Diagnosis not present

## 2015-01-12 DIAGNOSIS — M4646 Discitis, unspecified, lumbar region: Secondary | ICD-10-CM | POA: Diagnosis not present

## 2015-01-12 DIAGNOSIS — R52 Pain, unspecified: Secondary | ICD-10-CM | POA: Diagnosis not present

## 2015-01-15 DIAGNOSIS — Z23 Encounter for immunization: Secondary | ICD-10-CM | POA: Diagnosis not present

## 2015-01-15 DIAGNOSIS — M4646 Discitis, unspecified, lumbar region: Secondary | ICD-10-CM | POA: Diagnosis not present

## 2015-01-15 DIAGNOSIS — Z5181 Encounter for therapeutic drug level monitoring: Secondary | ICD-10-CM | POA: Diagnosis not present

## 2015-01-15 DIAGNOSIS — R52 Pain, unspecified: Secondary | ICD-10-CM | POA: Diagnosis not present

## 2015-01-15 DIAGNOSIS — N186 End stage renal disease: Secondary | ICD-10-CM | POA: Diagnosis not present

## 2015-01-15 DIAGNOSIS — D631 Anemia in chronic kidney disease: Secondary | ICD-10-CM | POA: Diagnosis not present

## 2015-01-16 DIAGNOSIS — M6281 Muscle weakness (generalized): Secondary | ICD-10-CM | POA: Diagnosis not present

## 2015-01-16 DIAGNOSIS — E43 Unspecified severe protein-calorie malnutrition: Secondary | ICD-10-CM | POA: Diagnosis not present

## 2015-01-16 DIAGNOSIS — M109 Gout, unspecified: Secondary | ICD-10-CM | POA: Diagnosis not present

## 2015-01-16 DIAGNOSIS — N481 Balanitis: Secondary | ICD-10-CM | POA: Diagnosis not present

## 2015-01-16 DIAGNOSIS — D631 Anemia in chronic kidney disease: Secondary | ICD-10-CM | POA: Diagnosis not present

## 2015-01-16 DIAGNOSIS — I471 Supraventricular tachycardia: Secondary | ICD-10-CM | POA: Diagnosis not present

## 2015-01-16 DIAGNOSIS — N186 End stage renal disease: Secondary | ICD-10-CM | POA: Diagnosis not present

## 2015-01-16 DIAGNOSIS — M545 Low back pain: Secondary | ICD-10-CM | POA: Diagnosis not present

## 2015-01-16 DIAGNOSIS — K219 Gastro-esophageal reflux disease without esophagitis: Secondary | ICD-10-CM | POA: Diagnosis not present

## 2015-01-16 DIAGNOSIS — M869 Osteomyelitis, unspecified: Secondary | ICD-10-CM | POA: Diagnosis not present

## 2015-01-16 DIAGNOSIS — I120 Hypertensive chronic kidney disease with stage 5 chronic kidney disease or end stage renal disease: Secondary | ICD-10-CM | POA: Diagnosis not present

## 2015-01-16 DIAGNOSIS — R21 Rash and other nonspecific skin eruption: Secondary | ICD-10-CM | POA: Diagnosis not present

## 2015-01-16 DIAGNOSIS — R2689 Other abnormalities of gait and mobility: Secondary | ICD-10-CM | POA: Diagnosis not present

## 2015-01-17 DIAGNOSIS — R52 Pain, unspecified: Secondary | ICD-10-CM | POA: Diagnosis not present

## 2015-01-17 DIAGNOSIS — D631 Anemia in chronic kidney disease: Secondary | ICD-10-CM | POA: Diagnosis not present

## 2015-01-17 DIAGNOSIS — M4646 Discitis, unspecified, lumbar region: Secondary | ICD-10-CM | POA: Diagnosis not present

## 2015-01-17 DIAGNOSIS — N186 End stage renal disease: Secondary | ICD-10-CM | POA: Diagnosis not present

## 2015-01-17 DIAGNOSIS — Z5181 Encounter for therapeutic drug level monitoring: Secondary | ICD-10-CM | POA: Diagnosis not present

## 2015-01-17 DIAGNOSIS — Z23 Encounter for immunization: Secondary | ICD-10-CM | POA: Diagnosis not present

## 2015-01-18 DIAGNOSIS — I471 Supraventricular tachycardia: Secondary | ICD-10-CM | POA: Diagnosis not present

## 2015-01-18 DIAGNOSIS — M545 Low back pain: Secondary | ICD-10-CM | POA: Diagnosis not present

## 2015-01-18 DIAGNOSIS — R2689 Other abnormalities of gait and mobility: Secondary | ICD-10-CM | POA: Diagnosis not present

## 2015-01-18 DIAGNOSIS — K219 Gastro-esophageal reflux disease without esophagitis: Secondary | ICD-10-CM | POA: Diagnosis not present

## 2015-01-18 DIAGNOSIS — I120 Hypertensive chronic kidney disease with stage 5 chronic kidney disease or end stage renal disease: Secondary | ICD-10-CM | POA: Diagnosis not present

## 2015-01-18 DIAGNOSIS — R21 Rash and other nonspecific skin eruption: Secondary | ICD-10-CM | POA: Diagnosis not present

## 2015-01-18 DIAGNOSIS — M109 Gout, unspecified: Secondary | ICD-10-CM | POA: Diagnosis not present

## 2015-01-18 DIAGNOSIS — M869 Osteomyelitis, unspecified: Secondary | ICD-10-CM | POA: Diagnosis not present

## 2015-01-18 DIAGNOSIS — N481 Balanitis: Secondary | ICD-10-CM | POA: Diagnosis not present

## 2015-01-18 DIAGNOSIS — N186 End stage renal disease: Secondary | ICD-10-CM | POA: Diagnosis not present

## 2015-01-18 DIAGNOSIS — E43 Unspecified severe protein-calorie malnutrition: Secondary | ICD-10-CM | POA: Diagnosis not present

## 2015-01-18 DIAGNOSIS — M6281 Muscle weakness (generalized): Secondary | ICD-10-CM | POA: Diagnosis not present

## 2015-01-18 DIAGNOSIS — D631 Anemia in chronic kidney disease: Secondary | ICD-10-CM | POA: Diagnosis not present

## 2015-01-18 LAB — AFB CULTURE WITH SMEAR (NOT AT ARMC)
Acid Fast Smear: NONE SEEN
Special Requests: 5

## 2015-01-19 DIAGNOSIS — N186 End stage renal disease: Secondary | ICD-10-CM | POA: Diagnosis not present

## 2015-01-19 DIAGNOSIS — R52 Pain, unspecified: Secondary | ICD-10-CM | POA: Diagnosis not present

## 2015-01-19 DIAGNOSIS — M4646 Discitis, unspecified, lumbar region: Secondary | ICD-10-CM | POA: Diagnosis not present

## 2015-01-19 DIAGNOSIS — D631 Anemia in chronic kidney disease: Secondary | ICD-10-CM | POA: Diagnosis not present

## 2015-01-19 DIAGNOSIS — Z23 Encounter for immunization: Secondary | ICD-10-CM | POA: Diagnosis not present

## 2015-01-19 DIAGNOSIS — Z5181 Encounter for therapeutic drug level monitoring: Secondary | ICD-10-CM | POA: Diagnosis not present

## 2015-01-21 DIAGNOSIS — M869 Osteomyelitis, unspecified: Secondary | ICD-10-CM | POA: Diagnosis not present

## 2015-01-21 DIAGNOSIS — I471 Supraventricular tachycardia: Secondary | ICD-10-CM | POA: Diagnosis not present

## 2015-01-21 DIAGNOSIS — E43 Unspecified severe protein-calorie malnutrition: Secondary | ICD-10-CM | POA: Diagnosis not present

## 2015-01-21 DIAGNOSIS — M545 Low back pain: Secondary | ICD-10-CM | POA: Diagnosis not present

## 2015-01-21 DIAGNOSIS — R21 Rash and other nonspecific skin eruption: Secondary | ICD-10-CM | POA: Diagnosis not present

## 2015-01-21 DIAGNOSIS — D631 Anemia in chronic kidney disease: Secondary | ICD-10-CM | POA: Diagnosis not present

## 2015-01-21 DIAGNOSIS — M6281 Muscle weakness (generalized): Secondary | ICD-10-CM | POA: Diagnosis not present

## 2015-01-21 DIAGNOSIS — R2689 Other abnormalities of gait and mobility: Secondary | ICD-10-CM | POA: Diagnosis not present

## 2015-01-21 DIAGNOSIS — I120 Hypertensive chronic kidney disease with stage 5 chronic kidney disease or end stage renal disease: Secondary | ICD-10-CM | POA: Diagnosis not present

## 2015-01-21 DIAGNOSIS — N186 End stage renal disease: Secondary | ICD-10-CM | POA: Diagnosis not present

## 2015-01-21 DIAGNOSIS — K219 Gastro-esophageal reflux disease without esophagitis: Secondary | ICD-10-CM | POA: Diagnosis not present

## 2015-01-21 DIAGNOSIS — N481 Balanitis: Secondary | ICD-10-CM | POA: Diagnosis not present

## 2015-01-21 DIAGNOSIS — M109 Gout, unspecified: Secondary | ICD-10-CM | POA: Diagnosis not present

## 2015-01-22 DIAGNOSIS — N186 End stage renal disease: Secondary | ICD-10-CM | POA: Diagnosis not present

## 2015-01-22 DIAGNOSIS — D631 Anemia in chronic kidney disease: Secondary | ICD-10-CM | POA: Diagnosis not present

## 2015-01-22 DIAGNOSIS — Z23 Encounter for immunization: Secondary | ICD-10-CM | POA: Diagnosis not present

## 2015-01-22 DIAGNOSIS — R52 Pain, unspecified: Secondary | ICD-10-CM | POA: Diagnosis not present

## 2015-01-22 DIAGNOSIS — M4646 Discitis, unspecified, lumbar region: Secondary | ICD-10-CM | POA: Diagnosis not present

## 2015-01-22 DIAGNOSIS — Z5181 Encounter for therapeutic drug level monitoring: Secondary | ICD-10-CM | POA: Diagnosis not present

## 2015-01-23 DIAGNOSIS — M545 Low back pain: Secondary | ICD-10-CM | POA: Diagnosis not present

## 2015-01-23 DIAGNOSIS — R2689 Other abnormalities of gait and mobility: Secondary | ICD-10-CM | POA: Diagnosis not present

## 2015-01-23 DIAGNOSIS — K219 Gastro-esophageal reflux disease without esophagitis: Secondary | ICD-10-CM | POA: Diagnosis not present

## 2015-01-23 DIAGNOSIS — M869 Osteomyelitis, unspecified: Secondary | ICD-10-CM | POA: Diagnosis not present

## 2015-01-23 DIAGNOSIS — M109 Gout, unspecified: Secondary | ICD-10-CM | POA: Diagnosis not present

## 2015-01-23 DIAGNOSIS — I471 Supraventricular tachycardia: Secondary | ICD-10-CM | POA: Diagnosis not present

## 2015-01-23 DIAGNOSIS — I120 Hypertensive chronic kidney disease with stage 5 chronic kidney disease or end stage renal disease: Secondary | ICD-10-CM | POA: Diagnosis not present

## 2015-01-23 DIAGNOSIS — R21 Rash and other nonspecific skin eruption: Secondary | ICD-10-CM | POA: Diagnosis not present

## 2015-01-23 DIAGNOSIS — N186 End stage renal disease: Secondary | ICD-10-CM | POA: Diagnosis not present

## 2015-01-23 DIAGNOSIS — M6281 Muscle weakness (generalized): Secondary | ICD-10-CM | POA: Diagnosis not present

## 2015-01-23 DIAGNOSIS — N481 Balanitis: Secondary | ICD-10-CM | POA: Diagnosis not present

## 2015-01-23 DIAGNOSIS — D631 Anemia in chronic kidney disease: Secondary | ICD-10-CM | POA: Diagnosis not present

## 2015-01-23 DIAGNOSIS — E43 Unspecified severe protein-calorie malnutrition: Secondary | ICD-10-CM | POA: Diagnosis not present

## 2015-01-24 DIAGNOSIS — R52 Pain, unspecified: Secondary | ICD-10-CM | POA: Diagnosis not present

## 2015-01-24 DIAGNOSIS — Z5181 Encounter for therapeutic drug level monitoring: Secondary | ICD-10-CM | POA: Diagnosis not present

## 2015-01-24 DIAGNOSIS — M4646 Discitis, unspecified, lumbar region: Secondary | ICD-10-CM | POA: Diagnosis not present

## 2015-01-24 DIAGNOSIS — N186 End stage renal disease: Secondary | ICD-10-CM | POA: Diagnosis not present

## 2015-01-24 DIAGNOSIS — D631 Anemia in chronic kidney disease: Secondary | ICD-10-CM | POA: Diagnosis not present

## 2015-01-24 DIAGNOSIS — Z23 Encounter for immunization: Secondary | ICD-10-CM | POA: Diagnosis not present

## 2015-01-25 DIAGNOSIS — N186 End stage renal disease: Secondary | ICD-10-CM | POA: Diagnosis not present

## 2015-01-25 DIAGNOSIS — I120 Hypertensive chronic kidney disease with stage 5 chronic kidney disease or end stage renal disease: Secondary | ICD-10-CM | POA: Diagnosis not present

## 2015-01-25 DIAGNOSIS — R197 Diarrhea, unspecified: Secondary | ICD-10-CM | POA: Diagnosis not present

## 2015-01-25 DIAGNOSIS — Z992 Dependence on renal dialysis: Secondary | ICD-10-CM | POA: Diagnosis not present

## 2015-01-26 DIAGNOSIS — D631 Anemia in chronic kidney disease: Secondary | ICD-10-CM | POA: Diagnosis not present

## 2015-01-26 DIAGNOSIS — R52 Pain, unspecified: Secondary | ICD-10-CM | POA: Diagnosis not present

## 2015-01-26 DIAGNOSIS — N186 End stage renal disease: Secondary | ICD-10-CM | POA: Diagnosis not present

## 2015-01-26 DIAGNOSIS — M4646 Discitis, unspecified, lumbar region: Secondary | ICD-10-CM | POA: Diagnosis not present

## 2015-01-26 DIAGNOSIS — Z5181 Encounter for therapeutic drug level monitoring: Secondary | ICD-10-CM | POA: Diagnosis not present

## 2015-01-26 DIAGNOSIS — Z23 Encounter for immunization: Secondary | ICD-10-CM | POA: Diagnosis not present

## 2015-01-29 DIAGNOSIS — Z5181 Encounter for therapeutic drug level monitoring: Secondary | ICD-10-CM | POA: Diagnosis not present

## 2015-01-29 DIAGNOSIS — N186 End stage renal disease: Secondary | ICD-10-CM | POA: Diagnosis not present

## 2015-01-29 DIAGNOSIS — Z23 Encounter for immunization: Secondary | ICD-10-CM | POA: Diagnosis not present

## 2015-01-29 DIAGNOSIS — D631 Anemia in chronic kidney disease: Secondary | ICD-10-CM | POA: Diagnosis not present

## 2015-01-29 DIAGNOSIS — R52 Pain, unspecified: Secondary | ICD-10-CM | POA: Diagnosis not present

## 2015-01-29 DIAGNOSIS — M4646 Discitis, unspecified, lumbar region: Secondary | ICD-10-CM | POA: Diagnosis not present

## 2015-01-30 DIAGNOSIS — R21 Rash and other nonspecific skin eruption: Secondary | ICD-10-CM | POA: Diagnosis not present

## 2015-01-30 DIAGNOSIS — E43 Unspecified severe protein-calorie malnutrition: Secondary | ICD-10-CM | POA: Diagnosis not present

## 2015-01-30 DIAGNOSIS — N481 Balanitis: Secondary | ICD-10-CM | POA: Diagnosis not present

## 2015-01-30 DIAGNOSIS — R2689 Other abnormalities of gait and mobility: Secondary | ICD-10-CM | POA: Diagnosis not present

## 2015-01-30 DIAGNOSIS — I471 Supraventricular tachycardia: Secondary | ICD-10-CM | POA: Diagnosis not present

## 2015-01-30 DIAGNOSIS — M6281 Muscle weakness (generalized): Secondary | ICD-10-CM | POA: Diagnosis not present

## 2015-01-30 DIAGNOSIS — M869 Osteomyelitis, unspecified: Secondary | ICD-10-CM | POA: Diagnosis not present

## 2015-01-30 DIAGNOSIS — I120 Hypertensive chronic kidney disease with stage 5 chronic kidney disease or end stage renal disease: Secondary | ICD-10-CM | POA: Diagnosis not present

## 2015-01-30 DIAGNOSIS — M545 Low back pain: Secondary | ICD-10-CM | POA: Diagnosis not present

## 2015-01-30 DIAGNOSIS — K219 Gastro-esophageal reflux disease without esophagitis: Secondary | ICD-10-CM | POA: Diagnosis not present

## 2015-01-30 DIAGNOSIS — N186 End stage renal disease: Secondary | ICD-10-CM | POA: Diagnosis not present

## 2015-01-30 DIAGNOSIS — D631 Anemia in chronic kidney disease: Secondary | ICD-10-CM | POA: Diagnosis not present

## 2015-01-30 DIAGNOSIS — M109 Gout, unspecified: Secondary | ICD-10-CM | POA: Diagnosis not present

## 2015-01-31 DIAGNOSIS — Z5181 Encounter for therapeutic drug level monitoring: Secondary | ICD-10-CM | POA: Diagnosis not present

## 2015-01-31 DIAGNOSIS — D631 Anemia in chronic kidney disease: Secondary | ICD-10-CM | POA: Diagnosis not present

## 2015-01-31 DIAGNOSIS — Z23 Encounter for immunization: Secondary | ICD-10-CM | POA: Diagnosis not present

## 2015-01-31 DIAGNOSIS — M4646 Discitis, unspecified, lumbar region: Secondary | ICD-10-CM | POA: Diagnosis not present

## 2015-01-31 DIAGNOSIS — N186 End stage renal disease: Secondary | ICD-10-CM | POA: Diagnosis not present

## 2015-01-31 DIAGNOSIS — R52 Pain, unspecified: Secondary | ICD-10-CM | POA: Diagnosis not present

## 2015-02-01 DIAGNOSIS — M109 Gout, unspecified: Secondary | ICD-10-CM | POA: Diagnosis not present

## 2015-02-01 DIAGNOSIS — Z992 Dependence on renal dialysis: Secondary | ICD-10-CM | POA: Diagnosis not present

## 2015-02-01 DIAGNOSIS — R21 Rash and other nonspecific skin eruption: Secondary | ICD-10-CM | POA: Diagnosis not present

## 2015-02-01 DIAGNOSIS — M545 Low back pain: Secondary | ICD-10-CM | POA: Diagnosis not present

## 2015-02-01 DIAGNOSIS — N186 End stage renal disease: Secondary | ICD-10-CM | POA: Diagnosis not present

## 2015-02-01 DIAGNOSIS — I120 Hypertensive chronic kidney disease with stage 5 chronic kidney disease or end stage renal disease: Secondary | ICD-10-CM | POA: Diagnosis not present

## 2015-02-01 DIAGNOSIS — E43 Unspecified severe protein-calorie malnutrition: Secondary | ICD-10-CM | POA: Diagnosis not present

## 2015-02-01 DIAGNOSIS — D631 Anemia in chronic kidney disease: Secondary | ICD-10-CM | POA: Diagnosis not present

## 2015-02-01 DIAGNOSIS — N481 Balanitis: Secondary | ICD-10-CM | POA: Diagnosis not present

## 2015-02-01 DIAGNOSIS — K219 Gastro-esophageal reflux disease without esophagitis: Secondary | ICD-10-CM | POA: Diagnosis not present

## 2015-02-01 DIAGNOSIS — M869 Osteomyelitis, unspecified: Secondary | ICD-10-CM | POA: Diagnosis not present

## 2015-02-01 DIAGNOSIS — M6281 Muscle weakness (generalized): Secondary | ICD-10-CM | POA: Diagnosis not present

## 2015-02-01 DIAGNOSIS — I471 Supraventricular tachycardia: Secondary | ICD-10-CM | POA: Diagnosis not present

## 2015-02-01 DIAGNOSIS — R1111 Vomiting without nausea: Secondary | ICD-10-CM | POA: Diagnosis not present

## 2015-02-01 DIAGNOSIS — R2689 Other abnormalities of gait and mobility: Secondary | ICD-10-CM | POA: Diagnosis not present

## 2015-02-02 DIAGNOSIS — Z5181 Encounter for therapeutic drug level monitoring: Secondary | ICD-10-CM | POA: Diagnosis not present

## 2015-02-02 DIAGNOSIS — M4646 Discitis, unspecified, lumbar region: Secondary | ICD-10-CM | POA: Diagnosis not present

## 2015-02-02 DIAGNOSIS — D631 Anemia in chronic kidney disease: Secondary | ICD-10-CM | POA: Diagnosis not present

## 2015-02-02 DIAGNOSIS — N186 End stage renal disease: Secondary | ICD-10-CM | POA: Diagnosis not present

## 2015-02-02 DIAGNOSIS — Z23 Encounter for immunization: Secondary | ICD-10-CM | POA: Diagnosis not present

## 2015-02-02 DIAGNOSIS — R52 Pain, unspecified: Secondary | ICD-10-CM | POA: Diagnosis not present

## 2015-02-05 DIAGNOSIS — M4646 Discitis, unspecified, lumbar region: Secondary | ICD-10-CM | POA: Diagnosis not present

## 2015-02-05 DIAGNOSIS — R52 Pain, unspecified: Secondary | ICD-10-CM | POA: Diagnosis not present

## 2015-02-05 DIAGNOSIS — Z5181 Encounter for therapeutic drug level monitoring: Secondary | ICD-10-CM | POA: Diagnosis not present

## 2015-02-05 DIAGNOSIS — N186 End stage renal disease: Secondary | ICD-10-CM | POA: Diagnosis not present

## 2015-02-05 DIAGNOSIS — Z23 Encounter for immunization: Secondary | ICD-10-CM | POA: Diagnosis not present

## 2015-02-05 DIAGNOSIS — Z992 Dependence on renal dialysis: Secondary | ICD-10-CM | POA: Diagnosis not present

## 2015-02-05 DIAGNOSIS — I120 Hypertensive chronic kidney disease with stage 5 chronic kidney disease or end stage renal disease: Secondary | ICD-10-CM | POA: Diagnosis not present

## 2015-02-05 DIAGNOSIS — D631 Anemia in chronic kidney disease: Secondary | ICD-10-CM | POA: Diagnosis not present

## 2015-02-06 ENCOUNTER — Ambulatory Visit (INDEPENDENT_AMBULATORY_CARE_PROVIDER_SITE_OTHER): Payer: Medicare Other | Admitting: Internal Medicine

## 2015-02-06 ENCOUNTER — Encounter: Payer: Self-pay | Admitting: Internal Medicine

## 2015-02-06 VITALS — BP 189/99 | HR 68 | Ht 70.0 in | Wt 114.8 lb

## 2015-02-06 DIAGNOSIS — M4646 Discitis, unspecified, lumbar region: Secondary | ICD-10-CM | POA: Diagnosis not present

## 2015-02-06 DIAGNOSIS — N481 Balanitis: Secondary | ICD-10-CM | POA: Diagnosis not present

## 2015-02-06 DIAGNOSIS — M109 Gout, unspecified: Secondary | ICD-10-CM | POA: Diagnosis not present

## 2015-02-06 DIAGNOSIS — M869 Osteomyelitis, unspecified: Secondary | ICD-10-CM | POA: Diagnosis not present

## 2015-02-06 DIAGNOSIS — R2689 Other abnormalities of gait and mobility: Secondary | ICD-10-CM | POA: Diagnosis not present

## 2015-02-06 DIAGNOSIS — K219 Gastro-esophageal reflux disease without esophagitis: Secondary | ICD-10-CM | POA: Diagnosis not present

## 2015-02-06 DIAGNOSIS — D631 Anemia in chronic kidney disease: Secondary | ICD-10-CM | POA: Diagnosis not present

## 2015-02-06 DIAGNOSIS — R21 Rash and other nonspecific skin eruption: Secondary | ICD-10-CM | POA: Diagnosis not present

## 2015-02-06 DIAGNOSIS — E43 Unspecified severe protein-calorie malnutrition: Secondary | ICD-10-CM | POA: Diagnosis not present

## 2015-02-06 DIAGNOSIS — I120 Hypertensive chronic kidney disease with stage 5 chronic kidney disease or end stage renal disease: Secondary | ICD-10-CM | POA: Diagnosis not present

## 2015-02-06 DIAGNOSIS — I471 Supraventricular tachycardia: Secondary | ICD-10-CM | POA: Diagnosis not present

## 2015-02-06 DIAGNOSIS — M545 Low back pain: Secondary | ICD-10-CM | POA: Diagnosis not present

## 2015-02-06 DIAGNOSIS — N186 End stage renal disease: Secondary | ICD-10-CM | POA: Diagnosis not present

## 2015-02-06 DIAGNOSIS — M6281 Muscle weakness (generalized): Secondary | ICD-10-CM | POA: Diagnosis not present

## 2015-02-06 LAB — C-REACTIVE PROTEIN: CRP: 2.8 mg/dL — AB (ref <0.60)

## 2015-02-06 NOTE — Progress Notes (Signed)
Patient ID: Brad [name], male   DOB: July 06, 1935, 79 y.o.   MRN: [id number]         [facility]  Patient Active Problem List   Diagnosis Date Noted  . Lumbar discitis 02/06/2015    Priority: High  . Loss of weight 01/09/2015    Priority: High  . ESRD on dialysis ([place]) 01/11/2013    Priority: Medium  . Rash and nonspecific skin eruption 01/09/2015  . Poor appetite 01/09/2015  . Pressure ulcer 12/08/2014  . Discitis 12/06/2014  . Osteomyelitis ([place]) 12/05/2014  . Protein-calorie malnutrition, severe ([place]) 08/15/2014  . UGI bleed 08/14/2014  . Anemia of chronic renal failure, stage 5 ([facility]) 08/14/2014  . GI bleed 08/14/2014  . Chest pain, atypical 07/09/2010  . ESOPHAGEAL STRICTURE 03/12/2010  . RENAL DIALYSIS STATUS 01/14/2010  . PHANTOM LIMB SYNDROME 12/16/2007  . COPD, MILD 12/02/2007  . CHRONIC KIDNEY DISEASE STAGE V 12/02/2007  . COLON POLYP 06/04/2006  . HYPERCHOLESTEROLEMIA 06/04/2006  . GOUT, UNSPECIFIED 06/04/2006  . HYPERTENSION, BENIGN SYSTEMIC 06/04/2006  . ANGINA PECTORIS, NOS 06/04/2006  . TACHYCARDIA, PAROXYSMAL SUPRAVENTRICULAR 06/04/2006    Patient's Medications  New Prescriptions   No medications on file  Previous Medications   ACETAMINOPHEN (TYLENOL) 500 MG TABLET    Take 1,000 mg by mouth every 6 (six) hours as needed for mild pain or moderate pain.   AMLODIPINE (NORVASC) 10 MG TABLET    Take 10 mg by mouth at bedtime. For blood pressure    ATORVASTATIN (LIPITOR) 40 MG TABLET    Take 40 mg by mouth daily.    B COMPLEX-C-FOLIC ACID (DIALYVITE TABLET) TABS    Take 1 tablet by mouth daily.     CALCIUM ACETATE (PHOSLO) 667 MG CAPSULE    Take 1,334 mg by mouth 3 (three) times daily with meals. And 1 cap with snack   CLOTRIMAZOLE (LOTRIMIN) 1 % CREAM    APPLY TO AFFECTED AREA 2 TIMES A DAY   COLCHICINE 0.6 MG TABLET    Take 0.6 mg by mouth 3 (three) times a week. Monday, wednesday, friday   FOSRENOL 1000 MG CHEWABLE TABLET        HYDROXYZINE (ATARAX/VISTARIL) 25 MG TABLET    TAKE 1 TABLET BY MOUTH EVERY 6 HOURS AS NEEDED FOR ITCHING   LOSARTAN (COZAAR) 25 MG TABLET    Take 25 mg by mouth daily.   METOPROLOL (TOPROL-XL) 200 MG 24 HR TABLET    Take 200 mg by mouth at bedtime.    METOPROLOL SUCCINATE (TOPROL-XL) 100 MG 24 HR TABLET       MIRTAZAPINE (REMERON) 7.5 MG TABLET    Take 7.5 mg by mouth daily.   MUPIROCIN OINTMENT (BACTROBAN) 2 %    Place 1 application into the nose 2 (two) times daily.   NUTRITIONAL SUPPLEMENTS (FEEDING SUPPLEMENT, NEPRO CARB STEADY,) LIQD    Take 237 mLs by mouth 2 (two) times daily between meals.   OMEPRAZOLE (PRILOSEC) 20 MG CAPSULE    Take 20 mg by mouth daily.     SENSIPAR 60 MG TABLET    Take 60 mg by mouth daily.   TRAMADOL (ULTRAM) 50 MG TABLET    Take 50 mg by mouth every 6 (six) hours as needed for moderate pain.    TRIAMCINOLONE CREAM (KENALOG) 0.1 %    Apply 1 application topically 2 (two) times daily.   Modified Medications   No medications on file  Discontinued Medications   No medications  on file  Subjective: Brad [name] is in for his routine follow-up visit. He was seen by my partner, Dr. Lucianne Lei dam, recently when he was hospitalized with severe low back pain. He recalls that his pain was 10 out of 10. He was found to have L4-5 vertebral infection. Blood cultures and a lumbar aspirate were culture negative. He was treated with IV vancomycin and cefazolin following his dialysis treatments. He completed 8 weeks of therapy about one week ago. He is still having low back and left hip pain but says that it has improved. He rates his pain currently at 4 out of 10. He is still taking tramadol twice daily. He has not had any fever, chills or sweats. He had one bout of diarrhea yesterday. He states that his appetite is been very poor. He frequently has problems gagging when he tries to eat. He has lost 20 pounds in the last 6-7 months unintentionally.  Review of Systems: Pertinent items  are noted in HPI.  Past Medical History  Diagnosis Date  . Hypertension   . High cholesterol   . Gout   . Anemia   . Renal disorder     End stage renal disease secondary to HTN nephrosclerosis  . Secondary hyperparathyroidism ([place])   . Hypocalcemia   . Colon polyps   . Paroxysmal supraventricular tachycardia ([place])   . Status post dilatation of esophageal stricture   . Syncope   . Rash and nonspecific skin eruption 01/09/2015  . Loss of weight 01/09/2015  . Poor appetite 01/09/2015    Social History  Substance Use Topics  . Smoking status: Never Smoker   . Smokeless tobacco: None  . Alcohol Use: No    No family history on file.  No Known Allergies  Objective: Filed Vitals:   02/06/15 1414  BP: 189/99  Pulse: 68  Height: 5\' 10"  (1.778 m)  Weight: 114 lb 12.8 oz (52.073 kg)   Body mass index is 16.47 kg/(m^2).  General: He is thin and pleasant. He is seated in his wheelchair Skin: Some excoriated lesions on his left hand Lungs: Clear Cor: Regular S1 and S2 with an early 1/6 systolic murmur heard best at the right upper sternal border Abdomen: Nontender Extremities: Right arm amputee  Lab Results SED RATE (mm/hr)  Date Value  01/09/2015 77*  12/05/2014 87*  06/08/2013 60*   CRP (mg/dL)  Date Value  01/09/2015 3.0*  12/05/2014 19.9*  06/08/2013 6.5*     Problem List Items Addressed This Visit      High   Lumbar discitis - Primary    Brad [name] is had some improvement in his pain following 2 months of empiric antibiotic therapy for culture negative lumbar infection. I will continue observation off of antibiotics and repeat his inflammatory markers. I am not sure if his infection is the sole called his recent unintentional weight loss. This may need further evaluation. I asked him to call me within the next week if he has persistent diarrhea. Otherwise, he will follow-up with me in 4 weeks.      Relevant Orders   C-reactive protein   Sedimentation rate         Michel Bickers, MD [facility] [place] [phone] pager   [phone] cell 02/06/2015, 2:54 PM

## 2015-02-06 NOTE — Assessment & Plan Note (Signed)
Brad [name] is had some improvement in his pain following 2 months of empiric antibiotic therapy for culture negative lumbar infection. I will continue observation off of antibiotics and repeat his inflammatory markers. I am not sure if his infection is the sole called his recent unintentional weight loss. This may need further evaluation. I asked him to call me within the next week if he has persistent diarrhea. Otherwise, he will follow-up with me in 4 weeks.

## 2015-02-07 DIAGNOSIS — N186 End stage renal disease: Secondary | ICD-10-CM | POA: Diagnosis not present

## 2015-02-07 DIAGNOSIS — R52 Pain, unspecified: Secondary | ICD-10-CM | POA: Diagnosis not present

## 2015-02-07 DIAGNOSIS — D688 Other specified coagulation defects: Secondary | ICD-10-CM | POA: Diagnosis not present

## 2015-02-07 DIAGNOSIS — N2581 Secondary hyperparathyroidism of renal origin: Secondary | ICD-10-CM | POA: Diagnosis not present

## 2015-02-07 DIAGNOSIS — D631 Anemia in chronic kidney disease: Secondary | ICD-10-CM | POA: Diagnosis not present

## 2015-02-07 LAB — SEDIMENTATION RATE: Sed Rate: 38 mm/hr — ABNORMAL HIGH (ref 0–20)

## 2015-02-08 DIAGNOSIS — M545 Low back pain: Secondary | ICD-10-CM | POA: Diagnosis not present

## 2015-02-08 DIAGNOSIS — N186 End stage renal disease: Secondary | ICD-10-CM | POA: Diagnosis not present

## 2015-02-08 DIAGNOSIS — M869 Osteomyelitis, unspecified: Secondary | ICD-10-CM | POA: Diagnosis not present

## 2015-02-08 DIAGNOSIS — E43 Unspecified severe protein-calorie malnutrition: Secondary | ICD-10-CM | POA: Diagnosis not present

## 2015-02-08 DIAGNOSIS — I120 Hypertensive chronic kidney disease with stage 5 chronic kidney disease or end stage renal disease: Secondary | ICD-10-CM | POA: Diagnosis not present

## 2015-02-08 DIAGNOSIS — D631 Anemia in chronic kidney disease: Secondary | ICD-10-CM | POA: Diagnosis not present

## 2015-02-08 DIAGNOSIS — R21 Rash and other nonspecific skin eruption: Secondary | ICD-10-CM | POA: Diagnosis not present

## 2015-02-08 DIAGNOSIS — M109 Gout, unspecified: Secondary | ICD-10-CM | POA: Diagnosis not present

## 2015-02-08 DIAGNOSIS — I471 Supraventricular tachycardia: Secondary | ICD-10-CM | POA: Diagnosis not present

## 2015-02-08 DIAGNOSIS — R2689 Other abnormalities of gait and mobility: Secondary | ICD-10-CM | POA: Diagnosis not present

## 2015-02-08 DIAGNOSIS — M6281 Muscle weakness (generalized): Secondary | ICD-10-CM | POA: Diagnosis not present

## 2015-02-08 DIAGNOSIS — K219 Gastro-esophageal reflux disease without esophagitis: Secondary | ICD-10-CM | POA: Diagnosis not present

## 2015-02-08 DIAGNOSIS — N481 Balanitis: Secondary | ICD-10-CM | POA: Diagnosis not present

## 2015-02-09 DIAGNOSIS — D631 Anemia in chronic kidney disease: Secondary | ICD-10-CM | POA: Diagnosis not present

## 2015-02-09 DIAGNOSIS — N2581 Secondary hyperparathyroidism of renal origin: Secondary | ICD-10-CM | POA: Diagnosis not present

## 2015-02-09 DIAGNOSIS — R52 Pain, unspecified: Secondary | ICD-10-CM | POA: Diagnosis not present

## 2015-02-09 DIAGNOSIS — N186 End stage renal disease: Secondary | ICD-10-CM | POA: Diagnosis not present

## 2015-02-09 DIAGNOSIS — D688 Other specified coagulation defects: Secondary | ICD-10-CM | POA: Diagnosis not present

## 2015-02-12 DIAGNOSIS — N2581 Secondary hyperparathyroidism of renal origin: Secondary | ICD-10-CM | POA: Diagnosis not present

## 2015-02-12 DIAGNOSIS — R52 Pain, unspecified: Secondary | ICD-10-CM | POA: Diagnosis not present

## 2015-02-12 DIAGNOSIS — D688 Other specified coagulation defects: Secondary | ICD-10-CM | POA: Diagnosis not present

## 2015-02-12 DIAGNOSIS — N186 End stage renal disease: Secondary | ICD-10-CM | POA: Diagnosis not present

## 2015-02-12 DIAGNOSIS — D631 Anemia in chronic kidney disease: Secondary | ICD-10-CM | POA: Diagnosis not present

## 2015-02-13 DIAGNOSIS — R2689 Other abnormalities of gait and mobility: Secondary | ICD-10-CM | POA: Diagnosis not present

## 2015-02-13 DIAGNOSIS — M109 Gout, unspecified: Secondary | ICD-10-CM | POA: Diagnosis not present

## 2015-02-13 DIAGNOSIS — M869 Osteomyelitis, unspecified: Secondary | ICD-10-CM | POA: Diagnosis not present

## 2015-02-13 DIAGNOSIS — N481 Balanitis: Secondary | ICD-10-CM | POA: Diagnosis not present

## 2015-02-13 DIAGNOSIS — R21 Rash and other nonspecific skin eruption: Secondary | ICD-10-CM | POA: Diagnosis not present

## 2015-02-13 DIAGNOSIS — N186 End stage renal disease: Secondary | ICD-10-CM | POA: Diagnosis not present

## 2015-02-13 DIAGNOSIS — K219 Gastro-esophageal reflux disease without esophagitis: Secondary | ICD-10-CM | POA: Diagnosis not present

## 2015-02-13 DIAGNOSIS — M545 Low back pain: Secondary | ICD-10-CM | POA: Diagnosis not present

## 2015-02-13 DIAGNOSIS — E43 Unspecified severe protein-calorie malnutrition: Secondary | ICD-10-CM | POA: Diagnosis not present

## 2015-02-13 DIAGNOSIS — I120 Hypertensive chronic kidney disease with stage 5 chronic kidney disease or end stage renal disease: Secondary | ICD-10-CM | POA: Diagnosis not present

## 2015-02-13 DIAGNOSIS — I471 Supraventricular tachycardia: Secondary | ICD-10-CM | POA: Diagnosis not present

## 2015-02-13 DIAGNOSIS — M6281 Muscle weakness (generalized): Secondary | ICD-10-CM | POA: Diagnosis not present

## 2015-02-13 DIAGNOSIS — D631 Anemia in chronic kidney disease: Secondary | ICD-10-CM | POA: Diagnosis not present

## 2015-02-14 DIAGNOSIS — N186 End stage renal disease: Secondary | ICD-10-CM | POA: Diagnosis not present

## 2015-02-14 DIAGNOSIS — R52 Pain, unspecified: Secondary | ICD-10-CM | POA: Diagnosis not present

## 2015-02-14 DIAGNOSIS — D631 Anemia in chronic kidney disease: Secondary | ICD-10-CM | POA: Diagnosis not present

## 2015-02-14 DIAGNOSIS — D688 Other specified coagulation defects: Secondary | ICD-10-CM | POA: Diagnosis not present

## 2015-02-14 DIAGNOSIS — N2581 Secondary hyperparathyroidism of renal origin: Secondary | ICD-10-CM | POA: Diagnosis not present

## 2015-02-15 DIAGNOSIS — K219 Gastro-esophageal reflux disease without esophagitis: Secondary | ICD-10-CM | POA: Diagnosis not present

## 2015-02-15 DIAGNOSIS — E43 Unspecified severe protein-calorie malnutrition: Secondary | ICD-10-CM | POA: Diagnosis not present

## 2015-02-15 DIAGNOSIS — M109 Gout, unspecified: Secondary | ICD-10-CM | POA: Diagnosis not present

## 2015-02-15 DIAGNOSIS — M869 Osteomyelitis, unspecified: Secondary | ICD-10-CM | POA: Diagnosis not present

## 2015-02-15 DIAGNOSIS — R21 Rash and other nonspecific skin eruption: Secondary | ICD-10-CM | POA: Diagnosis not present

## 2015-02-15 DIAGNOSIS — M545 Low back pain: Secondary | ICD-10-CM | POA: Diagnosis not present

## 2015-02-15 DIAGNOSIS — N186 End stage renal disease: Secondary | ICD-10-CM | POA: Diagnosis not present

## 2015-02-15 DIAGNOSIS — D631 Anemia in chronic kidney disease: Secondary | ICD-10-CM | POA: Diagnosis not present

## 2015-02-15 DIAGNOSIS — I471 Supraventricular tachycardia: Secondary | ICD-10-CM | POA: Diagnosis not present

## 2015-02-15 DIAGNOSIS — I120 Hypertensive chronic kidney disease with stage 5 chronic kidney disease or end stage renal disease: Secondary | ICD-10-CM | POA: Diagnosis not present

## 2015-02-15 DIAGNOSIS — M6281 Muscle weakness (generalized): Secondary | ICD-10-CM | POA: Diagnosis not present

## 2015-02-15 DIAGNOSIS — N481 Balanitis: Secondary | ICD-10-CM | POA: Diagnosis not present

## 2015-02-15 DIAGNOSIS — R2689 Other abnormalities of gait and mobility: Secondary | ICD-10-CM | POA: Diagnosis not present

## 2015-02-16 DIAGNOSIS — D688 Other specified coagulation defects: Secondary | ICD-10-CM | POA: Diagnosis not present

## 2015-02-16 DIAGNOSIS — N2581 Secondary hyperparathyroidism of renal origin: Secondary | ICD-10-CM | POA: Diagnosis not present

## 2015-02-16 DIAGNOSIS — N186 End stage renal disease: Secondary | ICD-10-CM | POA: Diagnosis not present

## 2015-02-16 DIAGNOSIS — R52 Pain, unspecified: Secondary | ICD-10-CM | POA: Diagnosis not present

## 2015-02-16 DIAGNOSIS — D631 Anemia in chronic kidney disease: Secondary | ICD-10-CM | POA: Diagnosis not present

## 2015-02-19 DIAGNOSIS — D631 Anemia in chronic kidney disease: Secondary | ICD-10-CM | POA: Diagnosis not present

## 2015-02-19 DIAGNOSIS — D688 Other specified coagulation defects: Secondary | ICD-10-CM | POA: Diagnosis not present

## 2015-02-19 DIAGNOSIS — R52 Pain, unspecified: Secondary | ICD-10-CM | POA: Diagnosis not present

## 2015-02-19 DIAGNOSIS — N186 End stage renal disease: Secondary | ICD-10-CM | POA: Diagnosis not present

## 2015-02-19 DIAGNOSIS — N2581 Secondary hyperparathyroidism of renal origin: Secondary | ICD-10-CM | POA: Diagnosis not present

## 2015-02-20 DIAGNOSIS — E43 Unspecified severe protein-calorie malnutrition: Secondary | ICD-10-CM | POA: Diagnosis not present

## 2015-02-20 DIAGNOSIS — I120 Hypertensive chronic kidney disease with stage 5 chronic kidney disease or end stage renal disease: Secondary | ICD-10-CM | POA: Diagnosis not present

## 2015-02-20 DIAGNOSIS — M6281 Muscle weakness (generalized): Secondary | ICD-10-CM | POA: Diagnosis not present

## 2015-02-20 DIAGNOSIS — N481 Balanitis: Secondary | ICD-10-CM | POA: Diagnosis not present

## 2015-02-20 DIAGNOSIS — N186 End stage renal disease: Secondary | ICD-10-CM | POA: Diagnosis not present

## 2015-02-20 DIAGNOSIS — M545 Low back pain: Secondary | ICD-10-CM | POA: Diagnosis not present

## 2015-02-20 DIAGNOSIS — I471 Supraventricular tachycardia: Secondary | ICD-10-CM | POA: Diagnosis not present

## 2015-02-20 DIAGNOSIS — K219 Gastro-esophageal reflux disease without esophagitis: Secondary | ICD-10-CM | POA: Diagnosis not present

## 2015-02-20 DIAGNOSIS — R2689 Other abnormalities of gait and mobility: Secondary | ICD-10-CM | POA: Diagnosis not present

## 2015-02-20 DIAGNOSIS — M869 Osteomyelitis, unspecified: Secondary | ICD-10-CM | POA: Diagnosis not present

## 2015-02-20 DIAGNOSIS — D631 Anemia in chronic kidney disease: Secondary | ICD-10-CM | POA: Diagnosis not present

## 2015-02-20 DIAGNOSIS — R21 Rash and other nonspecific skin eruption: Secondary | ICD-10-CM | POA: Diagnosis not present

## 2015-02-20 DIAGNOSIS — M109 Gout, unspecified: Secondary | ICD-10-CM | POA: Diagnosis not present

## 2015-02-21 DIAGNOSIS — N2581 Secondary hyperparathyroidism of renal origin: Secondary | ICD-10-CM | POA: Diagnosis not present

## 2015-02-21 DIAGNOSIS — R112 Nausea with vomiting, unspecified: Secondary | ICD-10-CM | POA: Diagnosis not present

## 2015-02-21 DIAGNOSIS — R52 Pain, unspecified: Secondary | ICD-10-CM | POA: Diagnosis not present

## 2015-02-21 DIAGNOSIS — D688 Other specified coagulation defects: Secondary | ICD-10-CM | POA: Diagnosis not present

## 2015-02-21 DIAGNOSIS — R1310 Dysphagia, unspecified: Secondary | ICD-10-CM | POA: Diagnosis not present

## 2015-02-21 DIAGNOSIS — N186 End stage renal disease: Secondary | ICD-10-CM | POA: Diagnosis not present

## 2015-02-21 DIAGNOSIS — D631 Anemia in chronic kidney disease: Secondary | ICD-10-CM | POA: Diagnosis not present

## 2015-02-21 DIAGNOSIS — R197 Diarrhea, unspecified: Secondary | ICD-10-CM | POA: Diagnosis not present

## 2015-02-21 DIAGNOSIS — R634 Abnormal weight loss: Secondary | ICD-10-CM | POA: Diagnosis not present

## 2015-02-22 DIAGNOSIS — R21 Rash and other nonspecific skin eruption: Secondary | ICD-10-CM | POA: Diagnosis not present

## 2015-02-22 DIAGNOSIS — D631 Anemia in chronic kidney disease: Secondary | ICD-10-CM | POA: Diagnosis not present

## 2015-02-22 DIAGNOSIS — N481 Balanitis: Secondary | ICD-10-CM | POA: Diagnosis not present

## 2015-02-22 DIAGNOSIS — I120 Hypertensive chronic kidney disease with stage 5 chronic kidney disease or end stage renal disease: Secondary | ICD-10-CM | POA: Diagnosis not present

## 2015-02-22 DIAGNOSIS — M109 Gout, unspecified: Secondary | ICD-10-CM | POA: Diagnosis not present

## 2015-02-22 DIAGNOSIS — N186 End stage renal disease: Secondary | ICD-10-CM | POA: Diagnosis not present

## 2015-02-22 DIAGNOSIS — I471 Supraventricular tachycardia: Secondary | ICD-10-CM | POA: Diagnosis not present

## 2015-02-22 DIAGNOSIS — M6281 Muscle weakness (generalized): Secondary | ICD-10-CM | POA: Diagnosis not present

## 2015-02-22 DIAGNOSIS — R2689 Other abnormalities of gait and mobility: Secondary | ICD-10-CM | POA: Diagnosis not present

## 2015-02-22 DIAGNOSIS — M869 Osteomyelitis, unspecified: Secondary | ICD-10-CM | POA: Diagnosis not present

## 2015-02-22 DIAGNOSIS — M545 Low back pain: Secondary | ICD-10-CM | POA: Diagnosis not present

## 2015-02-22 DIAGNOSIS — K219 Gastro-esophageal reflux disease without esophagitis: Secondary | ICD-10-CM | POA: Diagnosis not present

## 2015-02-22 DIAGNOSIS — E43 Unspecified severe protein-calorie malnutrition: Secondary | ICD-10-CM | POA: Diagnosis not present

## 2015-02-23 DIAGNOSIS — D631 Anemia in chronic kidney disease: Secondary | ICD-10-CM | POA: Diagnosis not present

## 2015-02-23 DIAGNOSIS — R52 Pain, unspecified: Secondary | ICD-10-CM | POA: Diagnosis not present

## 2015-02-23 DIAGNOSIS — D688 Other specified coagulation defects: Secondary | ICD-10-CM | POA: Diagnosis not present

## 2015-02-23 DIAGNOSIS — N186 End stage renal disease: Secondary | ICD-10-CM | POA: Diagnosis not present

## 2015-02-23 DIAGNOSIS — N2581 Secondary hyperparathyroidism of renal origin: Secondary | ICD-10-CM | POA: Diagnosis not present

## 2015-02-26 DIAGNOSIS — N186 End stage renal disease: Secondary | ICD-10-CM | POA: Diagnosis not present

## 2015-02-26 DIAGNOSIS — D688 Other specified coagulation defects: Secondary | ICD-10-CM | POA: Diagnosis not present

## 2015-02-26 DIAGNOSIS — N2581 Secondary hyperparathyroidism of renal origin: Secondary | ICD-10-CM | POA: Diagnosis not present

## 2015-02-26 DIAGNOSIS — D631 Anemia in chronic kidney disease: Secondary | ICD-10-CM | POA: Diagnosis not present

## 2015-02-26 DIAGNOSIS — R52 Pain, unspecified: Secondary | ICD-10-CM | POA: Diagnosis not present

## 2015-02-28 DIAGNOSIS — R52 Pain, unspecified: Secondary | ICD-10-CM | POA: Diagnosis not present

## 2015-02-28 DIAGNOSIS — D631 Anemia in chronic kidney disease: Secondary | ICD-10-CM | POA: Diagnosis not present

## 2015-02-28 DIAGNOSIS — N186 End stage renal disease: Secondary | ICD-10-CM | POA: Diagnosis not present

## 2015-02-28 DIAGNOSIS — D688 Other specified coagulation defects: Secondary | ICD-10-CM | POA: Diagnosis not present

## 2015-02-28 DIAGNOSIS — N2581 Secondary hyperparathyroidism of renal origin: Secondary | ICD-10-CM | POA: Diagnosis not present

## 2015-03-03 DIAGNOSIS — R52 Pain, unspecified: Secondary | ICD-10-CM | POA: Diagnosis not present

## 2015-03-03 DIAGNOSIS — N2581 Secondary hyperparathyroidism of renal origin: Secondary | ICD-10-CM | POA: Diagnosis not present

## 2015-03-03 DIAGNOSIS — N186 End stage renal disease: Secondary | ICD-10-CM | POA: Diagnosis not present

## 2015-03-03 DIAGNOSIS — D631 Anemia in chronic kidney disease: Secondary | ICD-10-CM | POA: Diagnosis not present

## 2015-03-03 DIAGNOSIS — D688 Other specified coagulation defects: Secondary | ICD-10-CM | POA: Diagnosis not present

## 2015-03-05 DIAGNOSIS — D631 Anemia in chronic kidney disease: Secondary | ICD-10-CM | POA: Diagnosis not present

## 2015-03-05 DIAGNOSIS — R52 Pain, unspecified: Secondary | ICD-10-CM | POA: Diagnosis not present

## 2015-03-05 DIAGNOSIS — D688 Other specified coagulation defects: Secondary | ICD-10-CM | POA: Diagnosis not present

## 2015-03-05 DIAGNOSIS — N186 End stage renal disease: Secondary | ICD-10-CM | POA: Diagnosis not present

## 2015-03-05 DIAGNOSIS — N2581 Secondary hyperparathyroidism of renal origin: Secondary | ICD-10-CM | POA: Diagnosis not present

## 2015-03-06 ENCOUNTER — Ambulatory Visit (INDEPENDENT_AMBULATORY_CARE_PROVIDER_SITE_OTHER): Payer: Medicare Other | Admitting: Internal Medicine

## 2015-03-06 ENCOUNTER — Encounter: Payer: Self-pay | Admitting: Internal Medicine

## 2015-03-06 VITALS — BP 214/114 | HR 66 | Temp 97.7°F | Wt 90.0 lb

## 2015-03-06 DIAGNOSIS — M4646 Discitis, unspecified, lumbar region: Secondary | ICD-10-CM

## 2015-03-06 DIAGNOSIS — R634 Abnormal weight loss: Secondary | ICD-10-CM | POA: Diagnosis not present

## 2015-03-06 DIAGNOSIS — R197 Diarrhea, unspecified: Secondary | ICD-10-CM | POA: Diagnosis not present

## 2015-03-06 LAB — COMPREHENSIVE METABOLIC PANEL
ALBUMIN: 3.8 g/dL (ref 3.6–5.1)
ALK PHOS: 121 U/L — AB (ref 40–115)
ALT: 14 U/L (ref 9–46)
AST: 27 U/L (ref 10–35)
BUN: 21 mg/dL (ref 7–25)
CALCIUM: 8.9 mg/dL (ref 8.6–10.3)
CO2: 32 mmol/L — ABNORMAL HIGH (ref 20–31)
Chloride: 97 mmol/L — ABNORMAL LOW (ref 98–110)
Creat: 5.38 mg/dL — ABNORMAL HIGH (ref 0.70–1.18)
Glucose, Bld: 90 mg/dL (ref 65–99)
POTASSIUM: 4 mmol/L (ref 3.5–5.3)
Sodium: 139 mmol/L (ref 135–146)
TOTAL PROTEIN: 6.6 g/dL (ref 6.1–8.1)
Total Bilirubin: 0.4 mg/dL (ref 0.2–1.2)

## 2015-03-06 LAB — CBC
HEMATOCRIT: 31.9 % — AB (ref 39.0–52.0)
HEMOGLOBIN: 10.8 g/dL — AB (ref 13.0–17.0)
MCH: 28 pg (ref 26.0–34.0)
MCHC: 33.9 g/dL (ref 30.0–36.0)
MCV: 82.6 fL (ref 78.0–100.0)
MPV: 9.8 fL (ref 8.6–12.4)
Platelets: 166 10*3/uL (ref 150–400)
RBC: 3.86 MIL/uL — AB (ref 4.22–5.81)
RDW: 14.7 % (ref 11.5–15.5)
WBC: 3.8 10*3/uL — ABNORMAL LOW (ref 4.0–10.5)

## 2015-03-06 MED ORDER — VANCOMYCIN HCL 250 MG PO CAPS: 250.0000 mg | ORAL_CAPSULE | Freq: Four times a day (QID) | ORAL | Status: AC

## 2015-03-06 NOTE — Progress Notes (Signed)
[facility] for Infectious Disease  Patient Active Problem List   Diagnosis Date Noted  . Diarrhea 03/06/2015    Priority: High  . Lumbar discitis 02/06/2015    Priority: High  . Loss of weight 01/09/2015    Priority: High  . ESRD on dialysis ([place]) 01/11/2013    Priority: Medium  . Rash and nonspecific skin eruption 01/09/2015  . Poor appetite 01/09/2015  . Pressure ulcer 12/08/2014  . Osteomyelitis ([place]) 12/05/2014  . Protein-calorie malnutrition, severe ([place]) 08/15/2014  . UGI bleed 08/14/2014  . Anemia of chronic renal failure, stage 5 (HCC) 08/14/2014  . GI bleed 08/14/2014  . Chest pain, atypical 07/09/2010  . ESOPHAGEAL STRICTURE 03/12/2010  . RENAL DIALYSIS STATUS 01/14/2010  . PHANTOM LIMB SYNDROME 12/16/2007  . COPD, MILD 12/02/2007  . CHRONIC KIDNEY DISEASE STAGE V 12/02/2007  . COLON POLYP 06/04/2006  . HYPERCHOLESTEROLEMIA 06/04/2006  . GOUT, UNSPECIFIED 06/04/2006  . HYPERTENSION, BENIGN SYSTEMIC 06/04/2006  . ANGINA PECTORIS, NOS 06/04/2006  . TACHYCARDIA, PAROXYSMAL SUPRAVENTRICULAR 06/04/2006    Patient's Medications  New Prescriptions   VANCOMYCIN (VANCOCIN) 250 MG CAPSULE    Take 1 capsule (250 mg total) by mouth 4 (four) times daily.  Previous Medications   ACETAMINOPHEN (TYLENOL) 500 MG TABLET    Take 1,000 mg by mouth every 6 (six) hours as needed for mild pain or moderate pain.   AMLODIPINE (NORVASC) 10 MG TABLET    Take 10 mg by mouth at bedtime. For blood pressure    ATORVASTATIN (LIPITOR) 40 MG TABLET    Take 40 mg by mouth daily.    B COMPLEX-C-FOLIC ACID (DIALYVITE TABLET) TABS    Take 1 tablet by mouth daily.     CALCIUM ACETATE (PHOSLO) 667 MG CAPSULE    Take 1,334 mg by mouth 3 (three) times daily with meals. And 1 cap with snack   CLOTRIMAZOLE (LOTRIMIN) 1 % CREAM    APPLY TO AFFECTED AREA 2 TIMES A DAY   COLCHICINE 0.6 MG TABLET    Take 0.6 mg by mouth 3 (three) times a week. Monday, wednesday, friday   DEXLANSOPRAZOLE  (DEXILANT) 60 MG CAPSULE    Take 60 mg by mouth daily.   FOSRENOL 1000 MG CHEWABLE TABLET       HYDROXYZINE (ATARAX/VISTARIL) 25 MG TABLET    TAKE 1 TABLET BY MOUTH EVERY 6 HOURS AS NEEDED FOR ITCHING   LOSARTAN (COZAAR) 25 MG TABLET    Take 25 mg by mouth daily.   METOPROLOL (TOPROL-XL) 200 MG 24 HR TABLET    Take 200 mg by mouth at bedtime.    METOPROLOL SUCCINATE (TOPROL-XL) 100 MG 24 HR TABLET       MIRTAZAPINE (REMERON) 7.5 MG TABLET    Take 7.5 mg by mouth daily.   MUPIROCIN OINTMENT (BACTROBAN) 2 %    Place 1 application into the nose 2 (two) times daily.   NUTRITIONAL SUPPLEMENTS (FEEDING SUPPLEMENT, NEPRO CARB STEADY,) LIQD    Take 237 mLs by mouth 2 (two) times daily between meals.   OMEPRAZOLE (PRILOSEC) 20 MG CAPSULE    Take 20 mg by mouth daily.     SENSIPAR 60 MG TABLET    Take 60 mg by mouth daily.   TRAMADOL (ULTRAM) 50 MG TABLET    Take 50 mg by mouth every 6 (six) hours as needed for moderate pain.    TRIAMCINOLONE CREAM (KENALOG) 0.1 %    Apply 1 application topically 2 (  two) times daily.   Modified Medications   No medications on file  Discontinued Medications   No medications on file    Subjective: Brad [name] is in for his routine follow-up visit. He completed 6 weeks of IV vancomycin and cefazolin on 01/30/2015 as treatment for his culture negative L4-5 discitis. He continues to have left hip pain but it is not worsened since he stopped antibiotics. He has been taking tramadol but states that this does not help his pain.  He has continued to have severe diarrhea. He is having watery bowel movements 6-8 times daily. His appetite is not as good as normal and whenever he eats he has diarrhea. He has had some nausea, vomiting and abdominal cramps. He has not had any fever or chills.  Review of Systems: Review of Systems  Constitutional: Positive for weight loss and malaise/fatigue. Negative for fever, chills and diaphoresis.  HENT: Negative for sore throat.     Respiratory: Negative for cough, sputum production and shortness of breath.   Cardiovascular: Negative for chest pain.  Gastrointestinal: Positive for nausea, vomiting, abdominal pain and diarrhea.  Genitourinary: Negative for dysuria and frequency.  Musculoskeletal: Positive for joint pain. Negative for myalgias.  Skin: Negative for rash.  Neurological: Negative for focal weakness.  Psychiatric/Behavioral: Negative for depression and substance abuse. The patient is not nervous/anxious.     Past Medical History  Diagnosis Date  . Hypertension   . High cholesterol   . Gout   . Anemia   . Renal disorder     End stage renal disease secondary to HTN nephrosclerosis  . Secondary hyperparathyroidism ([place])   . Hypocalcemia   . Colon polyps   . Paroxysmal supraventricular tachycardia ([place])   . Status post dilatation of esophageal stricture   . Syncope   . Rash and nonspecific skin eruption 01/09/2015  . Loss of weight 01/09/2015  . Poor appetite 01/09/2015    Social History  Substance Use Topics  . Smoking status: Never Smoker   . Smokeless tobacco: None  . Alcohol Use: No    No family history on file.  No Known Allergies  Objective: Filed Vitals:   03/06/15 1355  BP: 214/114  Pulse: 66  Temp: 97.7 F (36.5 C)  Weight: 90 lb (40.824 kg)   Body mass index is 12.91 kg/(m^2).  Physical Exam  Constitutional: He is oriented to person, place, and time.  He has lost 25 more pounds in the past 4 weeks. He is thin and frail and seated in his wheelchair.  Eyes: Conjunctivae are normal.  Cardiovascular: Normal rate and regular rhythm.   Pulmonary/Chest: Breath sounds normal.  Abdominal: Soft. Bowel sounds are normal. He exhibits no mass. There is no tenderness.  Musculoskeletal: Normal range of motion.  Neurological: He is alert and oriented to person, place, and time.  Skin: No rash noted.  Psychiatric: Mood and affect normal.    Lab Results    Problem List Items  Addressed This Visit      High   Diarrhea - Primary    I'm concerned that he may have antibiotic associated C. difficile colitis causing his severe diarrhea. I will check a stool for C. difficile toxin. Given the severity of his diarrhea I will go ahead and start empiric oral vancomycin and see him back in 2 weeks.      Relevant Medications   vancomycin (VANCOCIN) 250 MG capsule   Other Relevant Orders   CBC   Comprehensive metabolic panel  Clostridium Difficile by PCR   Loss of weight    I suspect that his extreme unintentional weight loss is due to his diarrhea.      Lumbar discitis    Although he is still having some left hip pain it has not gotten any worse since stopping antibiotics one month ago. I'm hopeful that his infection has been cured. I will continue observation off of systemic antibiotics and repeat his inflammatory markers.          Michel Bickers, MD [facility] [phone] pager   [phone] cell 03/06/2015, 2:25 PM

## 2015-03-06 NOTE — Assessment & Plan Note (Signed)
I suspect that his extreme unintentional weight loss is due to his diarrhea.

## 2015-03-06 NOTE — Assessment & Plan Note (Signed)
I'm concerned that he may have antibiotic associated C. difficile colitis causing his severe diarrhea. I will check a stool for C. difficile toxin. Given the severity of his diarrhea I will go ahead and start empiric oral vancomycin and see him back in 2 weeks.

## 2015-03-06 NOTE — Assessment & Plan Note (Signed)
Although he is still having some left hip pain it has not gotten any worse since stopping antibiotics one month ago. I'm hopeful that his infection has been cured. I will continue observation off of systemic antibiotics and repeat his inflammatory markers.

## 2015-03-07 DIAGNOSIS — D631 Anemia in chronic kidney disease: Secondary | ICD-10-CM | POA: Diagnosis not present

## 2015-03-07 DIAGNOSIS — N186 End stage renal disease: Secondary | ICD-10-CM | POA: Diagnosis not present

## 2015-03-07 DIAGNOSIS — R52 Pain, unspecified: Secondary | ICD-10-CM | POA: Diagnosis not present

## 2015-03-07 DIAGNOSIS — Z992 Dependence on renal dialysis: Secondary | ICD-10-CM | POA: Diagnosis not present

## 2015-03-07 DIAGNOSIS — D688 Other specified coagulation defects: Secondary | ICD-10-CM | POA: Diagnosis not present

## 2015-03-07 DIAGNOSIS — I120 Hypertensive chronic kidney disease with stage 5 chronic kidney disease or end stage renal disease: Secondary | ICD-10-CM | POA: Diagnosis not present

## 2015-03-07 DIAGNOSIS — N2581 Secondary hyperparathyroidism of renal origin: Secondary | ICD-10-CM | POA: Diagnosis not present

## 2015-03-08 ENCOUNTER — Telehealth: Payer: Self-pay | Admitting: *Deleted

## 2015-03-08 DIAGNOSIS — R197 Diarrhea, unspecified: Secondary | ICD-10-CM | POA: Diagnosis not present

## 2015-03-08 NOTE — Telephone Encounter (Addendum)
PA for oral vancomycin approved through 03/21/15.  [facility] notified.  Pt notified to pick up rx.

## 2015-03-09 DIAGNOSIS — D631 Anemia in chronic kidney disease: Secondary | ICD-10-CM | POA: Diagnosis not present

## 2015-03-09 DIAGNOSIS — N186 End stage renal disease: Secondary | ICD-10-CM | POA: Diagnosis not present

## 2015-03-09 DIAGNOSIS — R52 Pain, unspecified: Secondary | ICD-10-CM | POA: Diagnosis not present

## 2015-03-09 LAB — CLOSTRIDIUM DIFFICILE BY PCR: Toxigenic C. Difficile by PCR: NOT DETECTED

## 2015-03-12 DIAGNOSIS — D631 Anemia in chronic kidney disease: Secondary | ICD-10-CM | POA: Diagnosis not present

## 2015-03-12 DIAGNOSIS — R52 Pain, unspecified: Secondary | ICD-10-CM | POA: Diagnosis not present

## 2015-03-12 DIAGNOSIS — N186 End stage renal disease: Secondary | ICD-10-CM | POA: Diagnosis not present

## 2015-03-14 DIAGNOSIS — R52 Pain, unspecified: Secondary | ICD-10-CM | POA: Diagnosis not present

## 2015-03-14 DIAGNOSIS — D631 Anemia in chronic kidney disease: Secondary | ICD-10-CM | POA: Diagnosis not present

## 2015-03-14 DIAGNOSIS — N186 End stage renal disease: Secondary | ICD-10-CM | POA: Diagnosis not present

## 2015-03-16 DIAGNOSIS — N186 End stage renal disease: Secondary | ICD-10-CM | POA: Diagnosis not present

## 2015-03-16 DIAGNOSIS — D631 Anemia in chronic kidney disease: Secondary | ICD-10-CM | POA: Diagnosis not present

## 2015-03-16 DIAGNOSIS — R52 Pain, unspecified: Secondary | ICD-10-CM | POA: Diagnosis not present

## 2015-03-19 DIAGNOSIS — R52 Pain, unspecified: Secondary | ICD-10-CM | POA: Diagnosis not present

## 2015-03-19 DIAGNOSIS — N186 End stage renal disease: Secondary | ICD-10-CM | POA: Diagnosis not present

## 2015-03-19 DIAGNOSIS — D631 Anemia in chronic kidney disease: Secondary | ICD-10-CM | POA: Diagnosis not present

## 2015-03-20 ENCOUNTER — Ambulatory Visit (INDEPENDENT_AMBULATORY_CARE_PROVIDER_SITE_OTHER): Payer: Medicare Other | Admitting: Internal Medicine

## 2015-03-20 ENCOUNTER — Encounter: Payer: Self-pay | Admitting: Internal Medicine

## 2015-03-20 VITALS — BP 195/98 | HR 68 | Temp 97.7°F | Ht 70.0 in | Wt 97.5 lb

## 2015-03-20 DIAGNOSIS — M4646 Discitis, unspecified, lumbar region: Secondary | ICD-10-CM

## 2015-03-20 DIAGNOSIS — A09 Infectious gastroenteritis and colitis, unspecified: Secondary | ICD-10-CM | POA: Diagnosis not present

## 2015-03-20 DIAGNOSIS — R197 Diarrhea, unspecified: Secondary | ICD-10-CM

## 2015-03-20 DIAGNOSIS — N186 End stage renal disease: Secondary | ICD-10-CM | POA: Diagnosis not present

## 2015-03-20 DIAGNOSIS — M25552 Pain in left hip: Secondary | ICD-10-CM | POA: Diagnosis not present

## 2015-03-20 DIAGNOSIS — Z992 Dependence on renal dialysis: Secondary | ICD-10-CM | POA: Diagnosis not present

## 2015-03-20 DIAGNOSIS — I120 Hypertensive chronic kidney disease with stage 5 chronic kidney disease or end stage renal disease: Secondary | ICD-10-CM | POA: Diagnosis not present

## 2015-03-20 LAB — C-REACTIVE PROTEIN: CRP: <0.5 mg/dL (ref <0.60)

## 2015-03-20 NOTE — Assessment & Plan Note (Signed)
The cause of his recent diarrhea remains uncertain but he had a dramatic improvement with treatment for possible C. difficile colitis. He will complete therapy and follow-up with me next month.

## 2015-03-20 NOTE — Progress Notes (Signed)
[facility] for Infectious Disease  Patient Active Problem List   Diagnosis Date Noted  . Diarrhea 03/06/2015    Priority: High  . Lumbar discitis 02/06/2015    Priority: High  . Loss of weight 01/09/2015    Priority: High  . ESRD on dialysis ([place]) 01/11/2013    Priority: Medium  . Rash and nonspecific skin eruption 01/09/2015  . Poor appetite 01/09/2015  . Pressure ulcer 12/08/2014  . Osteomyelitis ([place]) 12/05/2014  . Protein-calorie malnutrition, severe ([place]) 08/15/2014  . UGI bleed 08/14/2014  . Anemia of chronic renal failure, stage 5 (HCC) 08/14/2014  . GI bleed 08/14/2014  . Chest pain, atypical 07/09/2010  . ESOPHAGEAL STRICTURE 03/12/2010  . RENAL DIALYSIS STATUS 01/14/2010  . PHANTOM LIMB SYNDROME 12/16/2007  . COPD, MILD 12/02/2007  . CHRONIC KIDNEY DISEASE STAGE V 12/02/2007  . COLON POLYP 06/04/2006  . HYPERCHOLESTEROLEMIA 06/04/2006  . GOUT, UNSPECIFIED 06/04/2006  . HYPERTENSION, BENIGN SYSTEMIC 06/04/2006  . ANGINA PECTORIS, NOS 06/04/2006  . TACHYCARDIA, PAROXYSMAL SUPRAVENTRICULAR 06/04/2006    Patient's Medications  New Prescriptions   No medications on file  Previous Medications   ACETAMINOPHEN (TYLENOL) 500 MG TABLET    Take 1,000 mg by mouth every 6 (six) hours as needed for mild pain or moderate pain.   AMLODIPINE (NORVASC) 10 MG TABLET    Take 10 mg by mouth at bedtime. For blood pressure    ATORVASTATIN (LIPITOR) 40 MG TABLET    Take 40 mg by mouth daily.    B COMPLEX-C-FOLIC ACID (DIALYVITE TABLET) TABS    Take 1 tablet by mouth daily.     CALCIUM ACETATE (PHOSLO) 667 MG CAPSULE    Take 1,334 mg by mouth 3 (three) times daily with meals. And 1 cap with snack   CLOTRIMAZOLE (LOTRIMIN) 1 % CREAM    APPLY TO AFFECTED AREA 2 TIMES A DAY   COLCHICINE 0.6 MG TABLET    Take 0.6 mg by mouth 3 (three) times a week. Monday, wednesday, friday   DEXLANSOPRAZOLE (DEXILANT) 60 MG CAPSULE    Take 60 mg by mouth daily.   FOSRENOL 1000 MG  CHEWABLE TABLET       HYDROXYZINE (ATARAX/VISTARIL) 25 MG TABLET    TAKE 1 TABLET BY MOUTH EVERY 6 HOURS AS NEEDED FOR ITCHING   LOSARTAN (COZAAR) 25 MG TABLET    Take 25 mg by mouth daily.   METOPROLOL (TOPROL-XL) 200 MG 24 HR TABLET    Take 200 mg by mouth at bedtime.    METOPROLOL SUCCINATE (TOPROL-XL) 100 MG 24 HR TABLET       MIRTAZAPINE (REMERON) 7.5 MG TABLET    Take 7.5 mg by mouth daily.   MUPIROCIN OINTMENT (BACTROBAN) 2 %    Place 1 application into the nose 2 (two) times daily.   NUTRITIONAL SUPPLEMENTS (FEEDING SUPPLEMENT, NEPRO CARB STEADY,) LIQD    Take 237 mLs by mouth 2 (two) times daily between meals.   OMEPRAZOLE (PRILOSEC) 20 MG CAPSULE    Take 20 mg by mouth daily.     SENSIPAR 60 MG TABLET    Take 60 mg by mouth daily.   TRAMADOL (ULTRAM) 50 MG TABLET    Take 50 mg by mouth every 6 (six) hours as needed for moderate pain.    TRIAMCINOLONE CREAM (KENALOG) 0.1 %    Apply 1 application topically 2 (two) times daily.    VANCOMYCIN (VANCOCIN) 250 MG CAPSULE    Take 1  capsule (250 mg total) by mouth 4 (four) times daily.  Modified Medications   No medications on file  Discontinued Medications   No medications on file    Subjective: Brad [name] is in with his wife for his routine follow-up visit. He started on oral vancomycin shortly after his visit 2 weeks ago. His stool C. difficile toxin PCR was negative but he has noted dramatic improvement in his diarrhea since starting it. He is only having 2-3 soft stool daily now. His abdominal pain, nausea and vomiting have resolved. His appetite has improved and he is gaining weight. He has not noted any change in his left hip pain since stopping antibiotics 6-1/2 weeks ago after treatment for culture negative lumbar infection.  Review of Systems: Review of Systems  Constitutional: Negative for fever, chills, weight loss, malaise/fatigue and diaphoresis.  HENT: Negative for sore throat.   Respiratory: Negative for cough, sputum  production and shortness of breath.   Cardiovascular: Negative for chest pain.  Gastrointestinal: Negative for nausea, vomiting and diarrhea.  Genitourinary: Negative for dysuria and frequency.  Musculoskeletal: Positive for joint pain. Negative for myalgias.  Skin: Negative for rash.  Neurological: Negative for focal weakness.  Psychiatric/Behavioral: Negative for depression and substance abuse. The patient is not nervous/anxious.     Past Medical History  Diagnosis Date  . Hypertension   . High cholesterol   . Gout   . Anemia   . Renal disorder     End stage renal disease secondary to HTN nephrosclerosis  . Secondary hyperparathyroidism ([place])   . Hypocalcemia   . Colon polyps   . Paroxysmal supraventricular tachycardia ([place])   . Status post dilatation of esophageal stricture   . Syncope   . Rash and nonspecific skin eruption 01/09/2015  . Loss of weight 01/09/2015  . Poor appetite 01/09/2015    Social History  Substance Use Topics  . Smoking status: Never Smoker   . Smokeless tobacco: None  . Alcohol Use: No    No family history on file.  No Known Allergies  Objective: Filed Vitals:   03/20/15 1444  BP: 195/98  Pulse: 68  Temp: 97.7 F (36.5 C)  TempSrc: Oral  Height: 5\' 10"  (1.778 m)  Weight: 97 lb 8 oz (44.226 kg)   Body mass index is 13.99 kg/(m^2).  Physical Exam  Constitutional:  He has gained 7 pounds in the past 2 weeks and looks much better. He is seated in his wheelchair.  HENT:  Mouth/Throat: No oropharyngeal exudate.  Cardiovascular: Normal rate and regular rhythm.   No murmur heard. Pulmonary/Chest: Breath sounds normal.  Abdominal: Soft. There is no tenderness.  Psychiatric: Mood normal.    Lab Results    Problem List Items Addressed This Visit      High   Diarrhea - Primary    The cause of his recent diarrhea remains uncertain but he had a dramatic improvement with treatment for possible C. difficile colitis. He will complete  therapy and follow-up with me next month.      Lumbar discitis    He still has some left hip pain but this is not gotten any worse after stopping antibiotics 6-1/2 weeks ago. I will repeat his inflammatory markers and see him back in 6 weeks.          Brad Bickers, MD [facility] for [facility] [phone] pager   [phone] cell 03/20/2015, 3:25 PM

## 2015-03-20 NOTE — Assessment & Plan Note (Signed)
He still has some left hip pain but this is not gotten any worse after stopping antibiotics 6-1/2 weeks ago. I will repeat his inflammatory markers and see him back in 6 weeks.

## 2015-03-21 DIAGNOSIS — R52 Pain, unspecified: Secondary | ICD-10-CM | POA: Diagnosis not present

## 2015-03-21 DIAGNOSIS — D631 Anemia in chronic kidney disease: Secondary | ICD-10-CM | POA: Diagnosis not present

## 2015-03-21 DIAGNOSIS — N186 End stage renal disease: Secondary | ICD-10-CM | POA: Diagnosis not present

## 2015-03-21 LAB — SEDIMENTATION RATE: Sed Rate: 40 mm/hr — ABNORMAL HIGH (ref 0–20)

## 2015-03-23 DIAGNOSIS — D631 Anemia in chronic kidney disease: Secondary | ICD-10-CM | POA: Diagnosis not present

## 2015-03-23 DIAGNOSIS — R52 Pain, unspecified: Secondary | ICD-10-CM | POA: Diagnosis not present

## 2015-03-23 DIAGNOSIS — N186 End stage renal disease: Secondary | ICD-10-CM | POA: Diagnosis not present

## 2015-03-26 DIAGNOSIS — D631 Anemia in chronic kidney disease: Secondary | ICD-10-CM | POA: Diagnosis not present

## 2015-03-26 DIAGNOSIS — N186 End stage renal disease: Secondary | ICD-10-CM | POA: Diagnosis not present

## 2015-03-26 DIAGNOSIS — R52 Pain, unspecified: Secondary | ICD-10-CM | POA: Diagnosis not present

## 2015-03-28 DIAGNOSIS — R52 Pain, unspecified: Secondary | ICD-10-CM | POA: Diagnosis not present

## 2015-03-28 DIAGNOSIS — D631 Anemia in chronic kidney disease: Secondary | ICD-10-CM | POA: Diagnosis not present

## 2015-03-28 DIAGNOSIS — N186 End stage renal disease: Secondary | ICD-10-CM | POA: Diagnosis not present

## 2015-03-30 DIAGNOSIS — D631 Anemia in chronic kidney disease: Secondary | ICD-10-CM | POA: Diagnosis not present

## 2015-03-30 DIAGNOSIS — N186 End stage renal disease: Secondary | ICD-10-CM | POA: Diagnosis not present

## 2015-03-30 DIAGNOSIS — R52 Pain, unspecified: Secondary | ICD-10-CM | POA: Diagnosis not present

## 2015-04-02 DIAGNOSIS — D631 Anemia in chronic kidney disease: Secondary | ICD-10-CM | POA: Diagnosis not present

## 2015-04-02 DIAGNOSIS — R52 Pain, unspecified: Secondary | ICD-10-CM | POA: Diagnosis not present

## 2015-04-02 DIAGNOSIS — N186 End stage renal disease: Secondary | ICD-10-CM | POA: Diagnosis not present

## 2015-04-04 DIAGNOSIS — R52 Pain, unspecified: Secondary | ICD-10-CM | POA: Diagnosis not present

## 2015-04-04 DIAGNOSIS — D631 Anemia in chronic kidney disease: Secondary | ICD-10-CM | POA: Diagnosis not present

## 2015-04-04 DIAGNOSIS — N186 End stage renal disease: Secondary | ICD-10-CM | POA: Diagnosis not present

## 2015-04-06 DIAGNOSIS — N186 End stage renal disease: Secondary | ICD-10-CM | POA: Diagnosis not present

## 2015-04-06 DIAGNOSIS — D631 Anemia in chronic kidney disease: Secondary | ICD-10-CM | POA: Diagnosis not present

## 2015-04-06 DIAGNOSIS — R52 Pain, unspecified: Secondary | ICD-10-CM | POA: Diagnosis not present

## 2015-04-07 DIAGNOSIS — I120 Hypertensive chronic kidney disease with stage 5 chronic kidney disease or end stage renal disease: Secondary | ICD-10-CM | POA: Diagnosis not present

## 2015-04-07 DIAGNOSIS — N186 End stage renal disease: Secondary | ICD-10-CM | POA: Diagnosis not present

## 2015-04-07 DIAGNOSIS — Z992 Dependence on renal dialysis: Secondary | ICD-10-CM | POA: Diagnosis not present

## 2015-04-09 DIAGNOSIS — R52 Pain, unspecified: Secondary | ICD-10-CM | POA: Diagnosis not present

## 2015-04-09 DIAGNOSIS — D509 Iron deficiency anemia, unspecified: Secondary | ICD-10-CM | POA: Diagnosis not present

## 2015-04-09 DIAGNOSIS — D631 Anemia in chronic kidney disease: Secondary | ICD-10-CM | POA: Diagnosis not present

## 2015-04-09 DIAGNOSIS — N186 End stage renal disease: Secondary | ICD-10-CM | POA: Diagnosis not present

## 2015-04-11 DIAGNOSIS — D509 Iron deficiency anemia, unspecified: Secondary | ICD-10-CM | POA: Diagnosis not present

## 2015-04-11 DIAGNOSIS — R52 Pain, unspecified: Secondary | ICD-10-CM | POA: Diagnosis not present

## 2015-04-11 DIAGNOSIS — D631 Anemia in chronic kidney disease: Secondary | ICD-10-CM | POA: Diagnosis not present

## 2015-04-11 DIAGNOSIS — N186 End stage renal disease: Secondary | ICD-10-CM | POA: Diagnosis not present

## 2015-04-13 DIAGNOSIS — D631 Anemia in chronic kidney disease: Secondary | ICD-10-CM | POA: Diagnosis not present

## 2015-04-13 DIAGNOSIS — D509 Iron deficiency anemia, unspecified: Secondary | ICD-10-CM | POA: Diagnosis not present

## 2015-04-13 DIAGNOSIS — R52 Pain, unspecified: Secondary | ICD-10-CM | POA: Diagnosis not present

## 2015-04-13 DIAGNOSIS — N186 End stage renal disease: Secondary | ICD-10-CM | POA: Diagnosis not present

## 2015-04-16 DIAGNOSIS — N186 End stage renal disease: Secondary | ICD-10-CM | POA: Diagnosis not present

## 2015-04-16 DIAGNOSIS — D509 Iron deficiency anemia, unspecified: Secondary | ICD-10-CM | POA: Diagnosis not present

## 2015-04-16 DIAGNOSIS — R52 Pain, unspecified: Secondary | ICD-10-CM | POA: Diagnosis not present

## 2015-04-16 DIAGNOSIS — D631 Anemia in chronic kidney disease: Secondary | ICD-10-CM | POA: Diagnosis not present

## 2015-04-18 DIAGNOSIS — R52 Pain, unspecified: Secondary | ICD-10-CM | POA: Diagnosis not present

## 2015-04-18 DIAGNOSIS — N186 End stage renal disease: Secondary | ICD-10-CM | POA: Diagnosis not present

## 2015-04-18 DIAGNOSIS — D509 Iron deficiency anemia, unspecified: Secondary | ICD-10-CM | POA: Diagnosis not present

## 2015-04-18 DIAGNOSIS — D631 Anemia in chronic kidney disease: Secondary | ICD-10-CM | POA: Diagnosis not present

## 2015-04-19 DIAGNOSIS — L308 Other specified dermatitis: Secondary | ICD-10-CM | POA: Diagnosis not present

## 2015-04-20 DIAGNOSIS — Z992 Dependence on renal dialysis: Secondary | ICD-10-CM | POA: Diagnosis not present

## 2015-04-20 DIAGNOSIS — T82858D Stenosis of vascular prosthetic devices, implants and grafts, subsequent encounter: Secondary | ICD-10-CM | POA: Diagnosis not present

## 2015-04-20 DIAGNOSIS — N186 End stage renal disease: Secondary | ICD-10-CM | POA: Diagnosis not present

## 2015-04-20 DIAGNOSIS — I871 Compression of vein: Secondary | ICD-10-CM | POA: Diagnosis not present

## 2015-04-21 DIAGNOSIS — N186 End stage renal disease: Secondary | ICD-10-CM | POA: Diagnosis not present

## 2015-04-21 DIAGNOSIS — D509 Iron deficiency anemia, unspecified: Secondary | ICD-10-CM | POA: Diagnosis not present

## 2015-04-21 DIAGNOSIS — R52 Pain, unspecified: Secondary | ICD-10-CM | POA: Diagnosis not present

## 2015-04-21 DIAGNOSIS — D631 Anemia in chronic kidney disease: Secondary | ICD-10-CM | POA: Diagnosis not present

## 2015-04-23 DIAGNOSIS — R52 Pain, unspecified: Secondary | ICD-10-CM | POA: Diagnosis not present

## 2015-04-23 DIAGNOSIS — D509 Iron deficiency anemia, unspecified: Secondary | ICD-10-CM | POA: Diagnosis not present

## 2015-04-23 DIAGNOSIS — N186 End stage renal disease: Secondary | ICD-10-CM | POA: Diagnosis not present

## 2015-04-23 DIAGNOSIS — D631 Anemia in chronic kidney disease: Secondary | ICD-10-CM | POA: Diagnosis not present

## 2015-04-25 DIAGNOSIS — N186 End stage renal disease: Secondary | ICD-10-CM | POA: Diagnosis not present

## 2015-04-25 DIAGNOSIS — D631 Anemia in chronic kidney disease: Secondary | ICD-10-CM | POA: Diagnosis not present

## 2015-04-25 DIAGNOSIS — D509 Iron deficiency anemia, unspecified: Secondary | ICD-10-CM | POA: Diagnosis not present

## 2015-04-25 DIAGNOSIS — R52 Pain, unspecified: Secondary | ICD-10-CM | POA: Diagnosis not present

## 2015-04-27 DIAGNOSIS — R52 Pain, unspecified: Secondary | ICD-10-CM | POA: Diagnosis not present

## 2015-04-27 DIAGNOSIS — D509 Iron deficiency anemia, unspecified: Secondary | ICD-10-CM | POA: Diagnosis not present

## 2015-04-27 DIAGNOSIS — N186 End stage renal disease: Secondary | ICD-10-CM | POA: Diagnosis not present

## 2015-04-27 DIAGNOSIS — D631 Anemia in chronic kidney disease: Secondary | ICD-10-CM | POA: Diagnosis not present

## 2015-04-30 DIAGNOSIS — N186 End stage renal disease: Secondary | ICD-10-CM | POA: Diagnosis not present

## 2015-04-30 DIAGNOSIS — D631 Anemia in chronic kidney disease: Secondary | ICD-10-CM | POA: Diagnosis not present

## 2015-04-30 DIAGNOSIS — R52 Pain, unspecified: Secondary | ICD-10-CM | POA: Diagnosis not present

## 2015-04-30 DIAGNOSIS — D509 Iron deficiency anemia, unspecified: Secondary | ICD-10-CM | POA: Diagnosis not present

## 2015-05-01 ENCOUNTER — Ambulatory Visit: Payer: Medicare Other | Admitting: Internal Medicine

## 2015-05-02 DIAGNOSIS — N186 End stage renal disease: Secondary | ICD-10-CM | POA: Diagnosis not present

## 2015-05-02 DIAGNOSIS — D631 Anemia in chronic kidney disease: Secondary | ICD-10-CM | POA: Diagnosis not present

## 2015-05-02 DIAGNOSIS — D509 Iron deficiency anemia, unspecified: Secondary | ICD-10-CM | POA: Diagnosis not present

## 2015-05-02 DIAGNOSIS — R52 Pain, unspecified: Secondary | ICD-10-CM | POA: Diagnosis not present

## 2015-05-04 DIAGNOSIS — D509 Iron deficiency anemia, unspecified: Secondary | ICD-10-CM | POA: Diagnosis not present

## 2015-05-04 DIAGNOSIS — R52 Pain, unspecified: Secondary | ICD-10-CM | POA: Diagnosis not present

## 2015-05-04 DIAGNOSIS — N186 End stage renal disease: Secondary | ICD-10-CM | POA: Diagnosis not present

## 2015-05-04 DIAGNOSIS — D631 Anemia in chronic kidney disease: Secondary | ICD-10-CM | POA: Diagnosis not present

## 2015-05-07 DIAGNOSIS — N186 End stage renal disease: Secondary | ICD-10-CM | POA: Diagnosis not present

## 2015-05-07 DIAGNOSIS — D631 Anemia in chronic kidney disease: Secondary | ICD-10-CM | POA: Diagnosis not present

## 2015-05-07 DIAGNOSIS — R52 Pain, unspecified: Secondary | ICD-10-CM | POA: Diagnosis not present

## 2015-05-07 DIAGNOSIS — D509 Iron deficiency anemia, unspecified: Secondary | ICD-10-CM | POA: Diagnosis not present

## 2015-05-08 ENCOUNTER — Encounter: Payer: Self-pay | Admitting: Internal Medicine

## 2015-05-08 ENCOUNTER — Ambulatory Visit (INDEPENDENT_AMBULATORY_CARE_PROVIDER_SITE_OTHER): Payer: Medicare Other | Admitting: Internal Medicine

## 2015-05-08 VITALS — BP 230/112 | HR 65 | Temp 97.7°F | Wt 104.5 lb

## 2015-05-08 DIAGNOSIS — Z992 Dependence on renal dialysis: Secondary | ICD-10-CM | POA: Diagnosis not present

## 2015-05-08 DIAGNOSIS — M4646 Discitis, unspecified, lumbar region: Secondary | ICD-10-CM | POA: Diagnosis not present

## 2015-05-08 DIAGNOSIS — I120 Hypertensive chronic kidney disease with stage 5 chronic kidney disease or end stage renal disease: Secondary | ICD-10-CM | POA: Diagnosis not present

## 2015-05-08 DIAGNOSIS — N186 End stage renal disease: Secondary | ICD-10-CM | POA: Diagnosis not present

## 2015-05-08 NOTE — Progress Notes (Signed)
[facility] for Infectious Disease  Patient Active Problem List   Diagnosis Date Noted  . Lumbar discitis 02/06/2015    Priority: High  . ESRD on dialysis ([place]) 01/11/2013    Priority: Medium  . Rash and nonspecific skin eruption 01/09/2015  . Poor appetite 01/09/2015  . Pressure ulcer 12/08/2014  . Osteomyelitis ([place]) 12/05/2014  . Protein-calorie malnutrition, severe ([place]) 08/15/2014  . UGI bleed 08/14/2014  . Anemia of chronic renal failure, stage 5 (HCC) 08/14/2014  . GI bleed 08/14/2014  . Chest pain, atypical 07/09/2010  . ESOPHAGEAL STRICTURE 03/12/2010  . RENAL DIALYSIS STATUS 01/14/2010  . PHANTOM LIMB SYNDROME 12/16/2007  . COPD, MILD 12/02/2007  . CHRONIC KIDNEY DISEASE STAGE V 12/02/2007  . COLON POLYP 06/04/2006  . HYPERCHOLESTEROLEMIA 06/04/2006  . GOUT, UNSPECIFIED 06/04/2006  . HYPERTENSION, BENIGN SYSTEMIC 06/04/2006  . ANGINA PECTORIS, NOS 06/04/2006  . TACHYCARDIA, PAROXYSMAL SUPRAVENTRICULAR 06/04/2006    Patient's Medications  New Prescriptions   No medications on file  Previous Medications   ACETAMINOPHEN (TYLENOL) 500 MG TABLET    Take 1,000 mg by mouth every 6 (six) hours as needed for mild pain or moderate pain.   AMLODIPINE (NORVASC) 10 MG TABLET    Take 10 mg by mouth at bedtime. For blood pressure    ATORVASTATIN (LIPITOR) 40 MG TABLET    Take 40 mg by mouth daily.    B COMPLEX-C-FOLIC ACID (DIALYVITE TABLET) TABS    Take 1 tablet by mouth daily.     CALCIUM ACETATE (PHOSLO) 667 MG CAPSULE    Take 1,334 mg by mouth 3 (three) times daily with meals. And 1 cap with snack   CLOTRIMAZOLE (LOTRIMIN) 1 % CREAM    APPLY TO AFFECTED AREA 2 TIMES A DAY   COLCHICINE 0.6 MG TABLET    Take 0.6 mg by mouth 3 (three) times a week. Monday, wednesday, friday   DEXLANSOPRAZOLE (DEXILANT) 60 MG CAPSULE    Take 60 mg by mouth daily.   FOSRENOL 1000 MG CHEWABLE TABLET       HYDROXYZINE (ATARAX/VISTARIL) 25 MG TABLET    TAKE 1 TABLET BY MOUTH EVERY  6 HOURS AS NEEDED FOR ITCHING   LOSARTAN (COZAAR) 25 MG TABLET    Take 25 mg by mouth daily.   METOPROLOL (TOPROL-XL) 200 MG 24 HR TABLET    Take 200 mg by mouth at bedtime.    METOPROLOL SUCCINATE (TOPROL-XL) 100 MG 24 HR TABLET       MIRTAZAPINE (REMERON) 7.5 MG TABLET    Take 7.5 mg by mouth daily.   MUPIROCIN OINTMENT (BACTROBAN) 2 %    Place 1 application into the nose 2 (two) times daily.   NUTRITIONAL SUPPLEMENTS (FEEDING SUPPLEMENT, NEPRO CARB STEADY,) LIQD    Take 237 mLs by mouth 2 (two) times daily between meals.   OMEPRAZOLE (PRILOSEC) 20 MG CAPSULE    Take 20 mg by mouth daily.     SENSIPAR 60 MG TABLET    Take 60 mg by mouth daily.   TRAMADOL (ULTRAM) 50 MG TABLET    Take 50 mg by mouth every 6 (six) hours as needed for moderate pain.    TRIAMCINOLONE CREAM (KENALOG) 0.1 %    Apply 1 application topically 2 (two) times daily.    VANCOMYCIN (VANCOCIN) 250 MG CAPSULE    Take 1 capsule (250 mg total) by mouth 4 (four) times daily.  Modified Medications   No medications on file  Discontinued Medications   No medications on file    Subjective: Brad [name] is in for his routine follow-up visit. He completed antibiotic therapy for his culture-negative lumbar discitis 3 months ago. Overall he is feeling better. His severe diarrhea has resolved and he is gaining some of the weight that he lost. He continues to have some pain in his hips, left greater than right and occasionally the pain will move around in both legs. He spends most of his day in his wheelchair. He does do some walking around the house. His wife states that he does not do his physical therapy like his therapist wanted him to.  Review of Systems: Review of Systems  Constitutional: Positive for malaise/fatigue. Negative for fever, chills, weight loss and diaphoresis.  HENT: Negative for sore throat.   Respiratory: Negative for cough, sputum production and shortness of breath.   Cardiovascular: Negative for chest pain.    Gastrointestinal: Negative for nausea, vomiting and diarrhea.  Genitourinary: Negative for dysuria and frequency.  Musculoskeletal: Positive for joint pain. Negative for back pain.  Skin: Negative for rash.  Neurological: Positive for weakness. Negative for focal weakness.  Psychiatric/Behavioral: Negative for depression.    Past Medical History  Diagnosis Date  . Hypertension   . High cholesterol   . Gout   . Anemia   . Renal disorder     End stage renal disease secondary to HTN nephrosclerosis  . Secondary hyperparathyroidism ([place])   . Hypocalcemia   . Colon polyps   . Paroxysmal supraventricular tachycardia ([place])   . Status post dilatation of esophageal stricture   . Syncope   . Rash and nonspecific skin eruption 01/09/2015  . Loss of weight 01/09/2015  . Poor appetite 01/09/2015    Social History  Substance Use Topics  . Smoking status: Never Smoker   . Smokeless tobacco: None  . Alcohol Use: No    No family history on file.  No Known Allergies  Objective: Filed Vitals:   05/08/15 0956  BP: 230/112  Pulse: 65  Temp: 97.7 F (36.5 C)  TempSrc: Oral  Weight: 104 lb 8 oz (47.401 kg)   Body mass index is 14.99 kg/(m^2).  Physical Exam  Constitutional: He is oriented to person, place, and time.  He is pleasant and in no distress. He is seated in his wheelchair.  HENT:  Mouth/Throat: No oropharyngeal exudate.  Eyes: Conjunctivae are normal.  Cardiovascular: Normal rate and regular rhythm.   No murmur heard. Pulmonary/Chest: Breath sounds normal.  Abdominal: Soft. There is no tenderness.  Musculoskeletal: Normal range of motion.  Neurological: He is alert and oriented to person, place, and time.  Skin: No rash noted.  Psychiatric: Mood and affect normal.    Lab Results    Problem List Items Addressed This Visit      High   Lumbar discitis - Primary    I believe that his lumbar discitis has been cured. His hip pain has improved and overall he is  getting better. His diarrhea has resolved and he is gaining weight. He will follow-up here as needed.          Michel Bickers, MD [facility] [place] [facility] [phone] pager   [phone] cell 05/08/2015, 10:19 AM

## 2015-05-08 NOTE — Assessment & Plan Note (Signed)
I believe that his lumbar discitis has been cured. His hip pain has improved and overall he is getting better. His diarrhea has resolved and he is gaining weight. He will follow-up here as needed.

## 2015-05-09 DIAGNOSIS — N186 End stage renal disease: Secondary | ICD-10-CM | POA: Diagnosis not present

## 2015-05-09 DIAGNOSIS — D509 Iron deficiency anemia, unspecified: Secondary | ICD-10-CM | POA: Diagnosis not present

## 2015-05-09 DIAGNOSIS — N2581 Secondary hyperparathyroidism of renal origin: Secondary | ICD-10-CM | POA: Diagnosis not present

## 2015-05-09 DIAGNOSIS — R52 Pain, unspecified: Secondary | ICD-10-CM | POA: Diagnosis not present

## 2015-05-11 DIAGNOSIS — N2581 Secondary hyperparathyroidism of renal origin: Secondary | ICD-10-CM | POA: Diagnosis not present

## 2015-05-11 DIAGNOSIS — D509 Iron deficiency anemia, unspecified: Secondary | ICD-10-CM | POA: Diagnosis not present

## 2015-05-11 DIAGNOSIS — N186 End stage renal disease: Secondary | ICD-10-CM | POA: Diagnosis not present

## 2015-05-11 DIAGNOSIS — R52 Pain, unspecified: Secondary | ICD-10-CM | POA: Diagnosis not present

## 2015-05-14 DIAGNOSIS — N186 End stage renal disease: Secondary | ICD-10-CM | POA: Diagnosis not present

## 2015-05-14 DIAGNOSIS — R52 Pain, unspecified: Secondary | ICD-10-CM | POA: Diagnosis not present

## 2015-05-14 DIAGNOSIS — D509 Iron deficiency anemia, unspecified: Secondary | ICD-10-CM | POA: Diagnosis not present

## 2015-05-14 DIAGNOSIS — N2581 Secondary hyperparathyroidism of renal origin: Secondary | ICD-10-CM | POA: Diagnosis not present

## 2015-05-16 DIAGNOSIS — R52 Pain, unspecified: Secondary | ICD-10-CM | POA: Diagnosis not present

## 2015-05-16 DIAGNOSIS — D509 Iron deficiency anemia, unspecified: Secondary | ICD-10-CM | POA: Diagnosis not present

## 2015-05-16 DIAGNOSIS — N186 End stage renal disease: Secondary | ICD-10-CM | POA: Diagnosis not present

## 2015-05-16 DIAGNOSIS — N2581 Secondary hyperparathyroidism of renal origin: Secondary | ICD-10-CM | POA: Diagnosis not present

## 2015-05-18 DIAGNOSIS — D509 Iron deficiency anemia, unspecified: Secondary | ICD-10-CM | POA: Diagnosis not present

## 2015-05-18 DIAGNOSIS — R52 Pain, unspecified: Secondary | ICD-10-CM | POA: Diagnosis not present

## 2015-05-18 DIAGNOSIS — N186 End stage renal disease: Secondary | ICD-10-CM | POA: Diagnosis not present

## 2015-05-18 DIAGNOSIS — N2581 Secondary hyperparathyroidism of renal origin: Secondary | ICD-10-CM | POA: Diagnosis not present

## 2015-05-21 DIAGNOSIS — R52 Pain, unspecified: Secondary | ICD-10-CM | POA: Diagnosis not present

## 2015-05-21 DIAGNOSIS — D509 Iron deficiency anemia, unspecified: Secondary | ICD-10-CM | POA: Diagnosis not present

## 2015-05-21 DIAGNOSIS — N186 End stage renal disease: Secondary | ICD-10-CM | POA: Diagnosis not present

## 2015-05-21 DIAGNOSIS — N2581 Secondary hyperparathyroidism of renal origin: Secondary | ICD-10-CM | POA: Diagnosis not present

## 2015-05-23 DIAGNOSIS — N186 End stage renal disease: Secondary | ICD-10-CM | POA: Diagnosis not present

## 2015-05-23 DIAGNOSIS — R52 Pain, unspecified: Secondary | ICD-10-CM | POA: Diagnosis not present

## 2015-05-23 DIAGNOSIS — N2581 Secondary hyperparathyroidism of renal origin: Secondary | ICD-10-CM | POA: Diagnosis not present

## 2015-05-23 DIAGNOSIS — D509 Iron deficiency anemia, unspecified: Secondary | ICD-10-CM | POA: Diagnosis not present

## 2015-05-25 DIAGNOSIS — R52 Pain, unspecified: Secondary | ICD-10-CM | POA: Diagnosis not present

## 2015-05-25 DIAGNOSIS — D509 Iron deficiency anemia, unspecified: Secondary | ICD-10-CM | POA: Diagnosis not present

## 2015-05-25 DIAGNOSIS — N2581 Secondary hyperparathyroidism of renal origin: Secondary | ICD-10-CM | POA: Diagnosis not present

## 2015-05-25 DIAGNOSIS — N186 End stage renal disease: Secondary | ICD-10-CM | POA: Diagnosis not present

## 2015-05-28 DIAGNOSIS — N2581 Secondary hyperparathyroidism of renal origin: Secondary | ICD-10-CM | POA: Diagnosis not present

## 2015-05-28 DIAGNOSIS — D509 Iron deficiency anemia, unspecified: Secondary | ICD-10-CM | POA: Diagnosis not present

## 2015-05-28 DIAGNOSIS — R52 Pain, unspecified: Secondary | ICD-10-CM | POA: Diagnosis not present

## 2015-05-28 DIAGNOSIS — N186 End stage renal disease: Secondary | ICD-10-CM | POA: Diagnosis not present

## 2015-05-30 DIAGNOSIS — D509 Iron deficiency anemia, unspecified: Secondary | ICD-10-CM | POA: Diagnosis not present

## 2015-05-30 DIAGNOSIS — R52 Pain, unspecified: Secondary | ICD-10-CM | POA: Diagnosis not present

## 2015-05-30 DIAGNOSIS — N2581 Secondary hyperparathyroidism of renal origin: Secondary | ICD-10-CM | POA: Diagnosis not present

## 2015-05-30 DIAGNOSIS — N186 End stage renal disease: Secondary | ICD-10-CM | POA: Diagnosis not present

## 2015-06-01 DIAGNOSIS — R52 Pain, unspecified: Secondary | ICD-10-CM | POA: Diagnosis not present

## 2015-06-01 DIAGNOSIS — D509 Iron deficiency anemia, unspecified: Secondary | ICD-10-CM | POA: Diagnosis not present

## 2015-06-01 DIAGNOSIS — N2581 Secondary hyperparathyroidism of renal origin: Secondary | ICD-10-CM | POA: Diagnosis not present

## 2015-06-01 DIAGNOSIS — N186 End stage renal disease: Secondary | ICD-10-CM | POA: Diagnosis not present

## 2015-06-04 DIAGNOSIS — R52 Pain, unspecified: Secondary | ICD-10-CM | POA: Diagnosis not present

## 2015-06-04 DIAGNOSIS — D509 Iron deficiency anemia, unspecified: Secondary | ICD-10-CM | POA: Diagnosis not present

## 2015-06-04 DIAGNOSIS — N186 End stage renal disease: Secondary | ICD-10-CM | POA: Diagnosis not present

## 2015-06-04 DIAGNOSIS — N2581 Secondary hyperparathyroidism of renal origin: Secondary | ICD-10-CM | POA: Diagnosis not present

## 2015-06-05 DIAGNOSIS — I120 Hypertensive chronic kidney disease with stage 5 chronic kidney disease or end stage renal disease: Secondary | ICD-10-CM | POA: Diagnosis not present

## 2015-06-05 DIAGNOSIS — Z992 Dependence on renal dialysis: Secondary | ICD-10-CM | POA: Diagnosis not present

## 2015-06-05 DIAGNOSIS — N186 End stage renal disease: Secondary | ICD-10-CM | POA: Diagnosis not present

## 2015-06-06 DIAGNOSIS — D509 Iron deficiency anemia, unspecified: Secondary | ICD-10-CM | POA: Diagnosis not present

## 2015-06-06 DIAGNOSIS — I120 Hypertensive chronic kidney disease with stage 5 chronic kidney disease or end stage renal disease: Secondary | ICD-10-CM | POA: Diagnosis not present

## 2015-06-06 DIAGNOSIS — N186 End stage renal disease: Secondary | ICD-10-CM | POA: Diagnosis not present

## 2015-06-06 DIAGNOSIS — R52 Pain, unspecified: Secondary | ICD-10-CM | POA: Diagnosis not present

## 2015-06-06 DIAGNOSIS — R51 Headache: Secondary | ICD-10-CM | POA: Diagnosis not present

## 2015-06-06 DIAGNOSIS — Z992 Dependence on renal dialysis: Secondary | ICD-10-CM | POA: Diagnosis not present

## 2015-06-06 DIAGNOSIS — N2581 Secondary hyperparathyroidism of renal origin: Secondary | ICD-10-CM | POA: Diagnosis not present

## 2015-06-07 DIAGNOSIS — Z992 Dependence on renal dialysis: Secondary | ICD-10-CM | POA: Diagnosis not present

## 2015-06-07 DIAGNOSIS — I120 Hypertensive chronic kidney disease with stage 5 chronic kidney disease or end stage renal disease: Secondary | ICD-10-CM | POA: Diagnosis not present

## 2015-06-07 DIAGNOSIS — E782 Mixed hyperlipidemia: Secondary | ICD-10-CM | POA: Diagnosis not present

## 2015-06-07 DIAGNOSIS — N186 End stage renal disease: Secondary | ICD-10-CM | POA: Diagnosis not present

## 2015-06-08 DIAGNOSIS — N186 End stage renal disease: Secondary | ICD-10-CM | POA: Diagnosis not present

## 2015-06-08 DIAGNOSIS — D509 Iron deficiency anemia, unspecified: Secondary | ICD-10-CM | POA: Diagnosis not present

## 2015-06-08 DIAGNOSIS — R51 Headache: Secondary | ICD-10-CM | POA: Diagnosis not present

## 2015-06-08 DIAGNOSIS — R52 Pain, unspecified: Secondary | ICD-10-CM | POA: Diagnosis not present

## 2015-06-08 DIAGNOSIS — N2581 Secondary hyperparathyroidism of renal origin: Secondary | ICD-10-CM | POA: Diagnosis not present

## 2015-06-11 DIAGNOSIS — N2581 Secondary hyperparathyroidism of renal origin: Secondary | ICD-10-CM | POA: Diagnosis not present

## 2015-06-11 DIAGNOSIS — R51 Headache: Secondary | ICD-10-CM | POA: Diagnosis not present

## 2015-06-11 DIAGNOSIS — N186 End stage renal disease: Secondary | ICD-10-CM | POA: Diagnosis not present

## 2015-06-11 DIAGNOSIS — R52 Pain, unspecified: Secondary | ICD-10-CM | POA: Diagnosis not present

## 2015-06-11 DIAGNOSIS — D509 Iron deficiency anemia, unspecified: Secondary | ICD-10-CM | POA: Diagnosis not present

## 2015-06-13 DIAGNOSIS — N2581 Secondary hyperparathyroidism of renal origin: Secondary | ICD-10-CM | POA: Diagnosis not present

## 2015-06-13 DIAGNOSIS — D509 Iron deficiency anemia, unspecified: Secondary | ICD-10-CM | POA: Diagnosis not present

## 2015-06-13 DIAGNOSIS — R52 Pain, unspecified: Secondary | ICD-10-CM | POA: Diagnosis not present

## 2015-06-13 DIAGNOSIS — R51 Headache: Secondary | ICD-10-CM | POA: Diagnosis not present

## 2015-06-13 DIAGNOSIS — N186 End stage renal disease: Secondary | ICD-10-CM | POA: Diagnosis not present

## 2015-06-15 DIAGNOSIS — R51 Headache: Secondary | ICD-10-CM | POA: Diagnosis not present

## 2015-06-15 DIAGNOSIS — R52 Pain, unspecified: Secondary | ICD-10-CM | POA: Diagnosis not present

## 2015-06-15 DIAGNOSIS — D509 Iron deficiency anemia, unspecified: Secondary | ICD-10-CM | POA: Diagnosis not present

## 2015-06-15 DIAGNOSIS — N186 End stage renal disease: Secondary | ICD-10-CM | POA: Diagnosis not present

## 2015-06-15 DIAGNOSIS — N2581 Secondary hyperparathyroidism of renal origin: Secondary | ICD-10-CM | POA: Diagnosis not present

## 2015-06-18 DIAGNOSIS — D509 Iron deficiency anemia, unspecified: Secondary | ICD-10-CM | POA: Diagnosis not present

## 2015-06-18 DIAGNOSIS — R51 Headache: Secondary | ICD-10-CM | POA: Diagnosis not present

## 2015-06-18 DIAGNOSIS — R52 Pain, unspecified: Secondary | ICD-10-CM | POA: Diagnosis not present

## 2015-06-18 DIAGNOSIS — N2581 Secondary hyperparathyroidism of renal origin: Secondary | ICD-10-CM | POA: Diagnosis not present

## 2015-06-18 DIAGNOSIS — N186 End stage renal disease: Secondary | ICD-10-CM | POA: Diagnosis not present

## 2015-06-19 ENCOUNTER — Encounter (HOSPITAL_COMMUNITY): Payer: Self-pay

## 2015-06-19 ENCOUNTER — Inpatient Hospital Stay (HOSPITAL_COMMUNITY)
Admission: EM | Admit: 2015-06-19 | Discharge: 2015-07-07 | DRG: 871 | Disposition: E | Payer: Medicare Other | Attending: Pulmonary Disease | Admitting: Pulmonary Disease

## 2015-06-19 ENCOUNTER — Emergency Department (HOSPITAL_COMMUNITY): Payer: Medicare Other

## 2015-06-19 DIAGNOSIS — M4646 Discitis, unspecified, lumbar region: Secondary | ICD-10-CM | POA: Diagnosis not present

## 2015-06-19 DIAGNOSIS — E162 Hypoglycemia, unspecified: Secondary | ICD-10-CM | POA: Diagnosis not present

## 2015-06-19 DIAGNOSIS — I469 Cardiac arrest, cause unspecified: Secondary | ICD-10-CM

## 2015-06-19 DIAGNOSIS — N185 Chronic kidney disease, stage 5: Secondary | ICD-10-CM | POA: Diagnosis not present

## 2015-06-19 DIAGNOSIS — J9601 Acute respiratory failure with hypoxia: Secondary | ICD-10-CM | POA: Diagnosis not present

## 2015-06-19 DIAGNOSIS — R6521 Severe sepsis with septic shock: Secondary | ICD-10-CM | POA: Diagnosis present

## 2015-06-19 DIAGNOSIS — J439 Emphysema, unspecified: Secondary | ICD-10-CM | POA: Diagnosis not present

## 2015-06-19 DIAGNOSIS — K560 Paralytic ileus: Secondary | ICD-10-CM | POA: Diagnosis present

## 2015-06-19 DIAGNOSIS — I472 Ventricular tachycardia: Secondary | ICD-10-CM | POA: Diagnosis not present

## 2015-06-19 DIAGNOSIS — N5089 Other specified disorders of the male genital organs: Secondary | ICD-10-CM | POA: Diagnosis not present

## 2015-06-19 DIAGNOSIS — R109 Unspecified abdominal pain: Secondary | ICD-10-CM | POA: Diagnosis present

## 2015-06-19 DIAGNOSIS — G931 Anoxic brain damage, not elsewhere classified: Secondary | ICD-10-CM | POA: Diagnosis not present

## 2015-06-19 DIAGNOSIS — Z79899 Other long term (current) drug therapy: Secondary | ICD-10-CM | POA: Diagnosis not present

## 2015-06-19 DIAGNOSIS — K56609 Unspecified intestinal obstruction, unspecified as to partial versus complete obstruction: Secondary | ICD-10-CM

## 2015-06-19 DIAGNOSIS — R1084 Generalized abdominal pain: Secondary | ICD-10-CM

## 2015-06-19 DIAGNOSIS — K529 Noninfective gastroenteritis and colitis, unspecified: Secondary | ICD-10-CM | POA: Diagnosis present

## 2015-06-19 DIAGNOSIS — I953 Hypotension of hemodialysis: Secondary | ICD-10-CM | POA: Diagnosis not present

## 2015-06-19 DIAGNOSIS — E7800 Pure hypercholesterolemia, unspecified: Secondary | ICD-10-CM | POA: Diagnosis not present

## 2015-06-19 DIAGNOSIS — K3532 Acute appendicitis with perforation and localized peritonitis, without abscess: Secondary | ICD-10-CM

## 2015-06-19 DIAGNOSIS — Z89201 Acquired absence of right upper limb, unspecified level: Secondary | ICD-10-CM

## 2015-06-19 DIAGNOSIS — Z992 Dependence on renal dialysis: Secondary | ICD-10-CM

## 2015-06-19 DIAGNOSIS — Z66 Do not resuscitate: Secondary | ICD-10-CM | POA: Diagnosis not present

## 2015-06-19 DIAGNOSIS — K352 Acute appendicitis with generalized peritonitis: Secondary | ICD-10-CM | POA: Diagnosis not present

## 2015-06-19 DIAGNOSIS — N189 Chronic kidney disease, unspecified: Secondary | ICD-10-CM | POA: Diagnosis not present

## 2015-06-19 DIAGNOSIS — D696 Thrombocytopenia, unspecified: Secondary | ICD-10-CM | POA: Diagnosis not present

## 2015-06-19 DIAGNOSIS — E43 Unspecified severe protein-calorie malnutrition: Secondary | ICD-10-CM | POA: Diagnosis present

## 2015-06-19 DIAGNOSIS — K5669 Other intestinal obstruction: Secondary | ICD-10-CM | POA: Diagnosis not present

## 2015-06-19 DIAGNOSIS — R636 Underweight: Secondary | ICD-10-CM | POA: Diagnosis present

## 2015-06-19 DIAGNOSIS — K567 Ileus, unspecified: Secondary | ICD-10-CM | POA: Diagnosis not present

## 2015-06-19 DIAGNOSIS — R0902 Hypoxemia: Secondary | ICD-10-CM | POA: Diagnosis not present

## 2015-06-19 DIAGNOSIS — M25472 Effusion, left ankle: Secondary | ICD-10-CM | POA: Diagnosis not present

## 2015-06-19 DIAGNOSIS — L8990 Pressure ulcer of unspecified site, unspecified stage: Secondary | ICD-10-CM | POA: Insufficient documentation

## 2015-06-19 DIAGNOSIS — E876 Hypokalemia: Secondary | ICD-10-CM | POA: Diagnosis present

## 2015-06-19 DIAGNOSIS — K5660 Unspecified intestinal obstruction: Secondary | ICD-10-CM | POA: Diagnosis present

## 2015-06-19 DIAGNOSIS — A419 Sepsis, unspecified organism: Principal | ICD-10-CM | POA: Diagnosis present

## 2015-06-19 DIAGNOSIS — Z681 Body mass index (BMI) 19 or less, adult: Secondary | ICD-10-CM

## 2015-06-19 DIAGNOSIS — N186 End stage renal disease: Secondary | ICD-10-CM

## 2015-06-19 DIAGNOSIS — K3580 Unspecified acute appendicitis: Secondary | ICD-10-CM

## 2015-06-19 DIAGNOSIS — M109 Gout, unspecified: Secondary | ICD-10-CM | POA: Diagnosis present

## 2015-06-19 DIAGNOSIS — E875 Hyperkalemia: Secondary | ICD-10-CM | POA: Diagnosis present

## 2015-06-19 DIAGNOSIS — R933 Abnormal findings on diagnostic imaging of other parts of digestive tract: Secondary | ICD-10-CM | POA: Diagnosis not present

## 2015-06-19 DIAGNOSIS — R609 Edema, unspecified: Secondary | ICD-10-CM

## 2015-06-19 DIAGNOSIS — R1032 Left lower quadrant pain: Secondary | ICD-10-CM | POA: Diagnosis not present

## 2015-06-19 DIAGNOSIS — Z09 Encounter for follow-up examination after completed treatment for conditions other than malignant neoplasm: Secondary | ICD-10-CM

## 2015-06-19 DIAGNOSIS — M7989 Other specified soft tissue disorders: Secondary | ICD-10-CM | POA: Diagnosis not present

## 2015-06-19 DIAGNOSIS — I10 Essential (primary) hypertension: Secondary | ICD-10-CM | POA: Diagnosis present

## 2015-06-19 DIAGNOSIS — K6389 Other specified diseases of intestine: Secondary | ICD-10-CM | POA: Diagnosis not present

## 2015-06-19 DIAGNOSIS — D631 Anemia in chronic kidney disease: Secondary | ICD-10-CM | POA: Diagnosis not present

## 2015-06-19 DIAGNOSIS — K353 Acute appendicitis with localized peritonitis: Secondary | ICD-10-CM | POA: Diagnosis not present

## 2015-06-19 DIAGNOSIS — G9340 Encephalopathy, unspecified: Secondary | ICD-10-CM

## 2015-06-19 DIAGNOSIS — R1031 Right lower quadrant pain: Secondary | ICD-10-CM | POA: Diagnosis not present

## 2015-06-19 DIAGNOSIS — Z4682 Encounter for fitting and adjustment of non-vascular catheter: Secondary | ICD-10-CM | POA: Diagnosis not present

## 2015-06-19 DIAGNOSIS — R64 Cachexia: Secondary | ICD-10-CM | POA: Diagnosis present

## 2015-06-19 DIAGNOSIS — K3589 Other acute appendicitis: Secondary | ICD-10-CM | POA: Diagnosis not present

## 2015-06-19 DIAGNOSIS — L89152 Pressure ulcer of sacral region, stage 2: Secondary | ICD-10-CM | POA: Diagnosis present

## 2015-06-19 DIAGNOSIS — N2581 Secondary hyperparathyroidism of renal origin: Secondary | ICD-10-CM | POA: Diagnosis present

## 2015-06-19 DIAGNOSIS — R001 Bradycardia, unspecified: Secondary | ICD-10-CM | POA: Diagnosis not present

## 2015-06-19 DIAGNOSIS — E46 Unspecified protein-calorie malnutrition: Secondary | ICD-10-CM

## 2015-06-19 DIAGNOSIS — R1013 Epigastric pain: Secondary | ICD-10-CM | POA: Diagnosis not present

## 2015-06-19 DIAGNOSIS — J3489 Other specified disorders of nose and nasal sinuses: Secondary | ICD-10-CM

## 2015-06-19 DIAGNOSIS — I120 Hypertensive chronic kidney disease with stage 5 chronic kidney disease or end stage renal disease: Secondary | ICD-10-CM | POA: Diagnosis not present

## 2015-06-19 DIAGNOSIS — M25572 Pain in left ankle and joints of left foot: Secondary | ICD-10-CM

## 2015-06-19 DIAGNOSIS — R140 Abdominal distension (gaseous): Secondary | ICD-10-CM | POA: Diagnosis not present

## 2015-06-19 DIAGNOSIS — J9811 Atelectasis: Secondary | ICD-10-CM | POA: Diagnosis not present

## 2015-06-19 DIAGNOSIS — E874 Mixed disorder of acid-base balance: Secondary | ICD-10-CM | POA: Diagnosis not present

## 2015-06-19 DIAGNOSIS — Z01818 Encounter for other preprocedural examination: Secondary | ICD-10-CM

## 2015-06-19 LAB — COMPREHENSIVE METABOLIC PANEL
ALBUMIN: 2.9 g/dL — AB (ref 3.5–5.0)
ALK PHOS: 105 U/L (ref 38–126)
ALT: 24 U/L (ref 17–63)
ANION GAP: 26 — AB (ref 5–15)
AST: 47 U/L — AB (ref 15–41)
BILIRUBIN TOTAL: 1.1 mg/dL (ref 0.3–1.2)
BUN: 28 mg/dL — AB (ref 6–20)
CALCIUM: 9.2 mg/dL (ref 8.9–10.3)
CO2: 25 mmol/L (ref 22–32)
Chloride: 91 mmol/L — ABNORMAL LOW (ref 101–111)
Creatinine, Ser: 6.28 mg/dL — ABNORMAL HIGH (ref 0.61–1.24)
GFR calc Af Amer: 9 mL/min — ABNORMAL LOW (ref >60)
GFR calc non Af Amer: 8 mL/min — ABNORMAL LOW (ref >60)
GLUCOSE: 61 mg/dL — AB (ref 65–99)
POTASSIUM: 4.3 mmol/L (ref 3.5–5.1)
SODIUM: 142 mmol/L (ref 135–145)
Total Protein: 7.2 g/dL (ref 6.5–8.1)

## 2015-06-19 LAB — CBC WITH DIFFERENTIAL/PLATELET
BASOS ABS: 0 10*3/uL (ref 0.0–0.1)
Basophils Relative: 0 %
EOS PCT: 3 %
Eosinophils Absolute: 0.2 10*3/uL (ref 0.0–0.7)
HEMATOCRIT: 32 % — AB (ref 39.0–52.0)
Hemoglobin: 10.6 g/dL — ABNORMAL LOW (ref 13.0–17.0)
LYMPHS ABS: 0.5 10*3/uL — AB (ref 0.7–4.0)
LYMPHS PCT: 8 %
MCH: 27.5 pg (ref 26.0–34.0)
MCHC: 33.1 g/dL (ref 30.0–36.0)
MCV: 83.1 fL (ref 78.0–100.0)
MONOS PCT: 3 %
Monocytes Absolute: 0.2 10*3/uL (ref 0.1–1.0)
NEUTROS PCT: 86 %
Neutro Abs: 5.6 10*3/uL (ref 1.7–7.7)
Platelets: 141 10*3/uL — ABNORMAL LOW (ref 150–400)
RBC: 3.85 MIL/uL — AB (ref 4.22–5.81)
RDW: 14.8 % (ref 11.5–15.5)
WBC MORPHOLOGY: INCREASED
WBC: 6.5 10*3/uL (ref 4.0–10.5)

## 2015-06-19 LAB — LIPASE, BLOOD: Lipase: 18 U/L (ref 11–51)

## 2015-06-19 LAB — CBG MONITORING, ED: GLUCOSE-CAPILLARY: 90 mg/dL (ref 65–99)

## 2015-06-19 MED ORDER — DEXTROSE 50 % IV SOLN
1.0000 | Freq: Once | INTRAVENOUS | Status: AC
  Administered 2015-06-19: 50 mL via INTRAVENOUS
  Filled 2015-06-19: qty 50

## 2015-06-19 MED ORDER — CIPROFLOXACIN IN D5W 400 MG/200ML IV SOLN
400.0000 mg | Freq: Once | INTRAVENOUS | Status: AC
  Administered 2015-06-19: 400 mg via INTRAVENOUS
  Filled 2015-06-19: qty 200

## 2015-06-19 MED ORDER — METRONIDAZOLE IN NACL 5-0.79 MG/ML-% IV SOLN
500.0000 mg | Freq: Three times a day (TID) | INTRAVENOUS | Status: DC
  Administered 2015-06-20 – 2015-07-02 (×36): 500 mg via INTRAVENOUS
  Filled 2015-06-19 (×38): qty 100

## 2015-06-19 MED ORDER — SODIUM CHLORIDE 0.9 % IV BOLUS (SEPSIS)
500.0000 mL | Freq: Once | INTRAVENOUS | Status: AC
  Administered 2015-06-19: 500 mL via INTRAVENOUS

## 2015-06-19 MED ORDER — CIPROFLOXACIN IN D5W 400 MG/200ML IV SOLN
400.0000 mg | INTRAVENOUS | Status: DC
  Administered 2015-06-20 – 2015-07-01 (×12): 400 mg via INTRAVENOUS
  Filled 2015-06-19 (×12): qty 200

## 2015-06-19 MED ORDER — METRONIDAZOLE IN NACL 5-0.79 MG/ML-% IV SOLN
500.0000 mg | Freq: Once | INTRAVENOUS | Status: AC
  Administered 2015-06-19: 500 mg via INTRAVENOUS
  Filled 2015-06-19: qty 100

## 2015-06-19 MED ORDER — ONDANSETRON HCL 4 MG/2ML IJ SOLN
4.0000 mg | Freq: Once | INTRAMUSCULAR | Status: AC
  Administered 2015-06-19: 4 mg via INTRAVENOUS
  Filled 2015-06-19: qty 2

## 2015-06-19 MED ORDER — IOHEXOL 300 MG/ML  SOLN: 25.0000 mL | INTRAMUSCULAR | Status: AC

## 2015-06-19 NOTE — Progress Notes (Signed)
Pharmacy Antibiotic Note  Brad [name] is a 80 y.o. male w/ ESRD on HD admitted on 06/06/2015 with intra-abdominal infection .  Pharmacy has been consulted for ciprofloxacin dosing. WBC 6.5, afebrile. SCr 6.28.  Last HD session on 3/13.    Plan: Ciprofloxacin 400mg  IV Q24h  F/U LOT, clinical course    Temp (24hrs), Avg:96.9 F (36.1 C), Min:96.9 F (36.1 C), Max:96.9 F (36.1 C)   Recent Labs Lab 06/07/2015 1400  WBC 6.5  CREATININE 6.28*    CrCl cannot be calculated (Unknown ideal weight.).    No Known Allergies  Antimicrobials this admission: 3/14 cipro>> 3/14 Flagyl >>  Thank you for allowing pharmacy to be a part of this patient's care.  Brad [name] C. Lennox Grumbles, PharmD Pharmacy Resident  Pager: [phone] 06/28/2015 10:20 PM

## 2015-06-19 NOTE — ED Provider Notes (Signed)
CSN: [id number]     Arrival date & time 06/22/2015  1242 History   First MD Initiated Contact with Patient 06/09/2015 1304     Chief Complaint  Patient presents with  . Abdominal Pain  . Emesis     (Consider location/radiation/quality/duration/timing/severity/associated sxs/prior Treatment) HPI Comments: Last night began to have diarrhea Non bloody Nausea, dry heaves, no emesis 4 episodes of diarrhea  Lower abdominal pain, bilateral lower abdomen, sharp, constant, nonradiating Chills No fevers Does not make urine Not eating/drinking as this worsens nausea and pain   Dialysis yesterday, had to discontinue treatment because of low blood pressures Dizziness Gen weakness for months    Patient is a 80 y.o. male presenting with abdominal pain and vomiting.  Abdominal Pain Associated symptoms: chills, diarrhea and nausea   Associated symptoms: no chest pain, no constipation, no fever, no shortness of breath, no sore throat and no vomiting   Emesis Associated symptoms: abdominal pain, chills and diarrhea   Associated symptoms: no headaches and no sore throat     Past Medical History  Diagnosis Date  . Hypertension   . High cholesterol   . Gout   . Anemia   . Renal disorder     End stage renal disease secondary to HTN nephrosclerosis  . Secondary hyperparathyroidism ([place])   . Hypocalcemia   . Colon polyps   . Paroxysmal supraventricular tachycardia ([place])   . Status post dilatation of esophageal stricture   . Syncope   . Rash and nonspecific skin eruption 01/09/2015  . Loss of weight 01/09/2015  . Poor appetite 01/09/2015   Past Surgical History  Procedure Laterality Date  . Right arm amputation     . Revision of arteriovenous goretex graft Left 12/15/2012    Procedure: REVISION OF ARTERIOVENOUS GORETEX GRAFT using 67mm x 20cm Gortex graft;  Surgeon: Mal Misty, MD;  Location: [place];  Service: Vascular;  Laterality: Left;  . Esophagogastroduodenoscopy N/A 01/27/2014     Procedure: ESOPHAGOGASTRODUODENOSCOPY (EGD);  Surgeon: Beryle Beams, MD;  Location: [facility];  Service: Endoscopy;  Laterality: N/A;  . Esophageal dilation N/A 01/27/2014    Procedure: ESOPHAGEAL DILATION;  Surgeon: Beryle Beams, MD;  Location: [facility] ENDOSCOPY;  Service: Endoscopy;  Laterality: N/A;  Balloon Dilation  . Esophagogastroduodenoscopy N/A 08/15/2014    Procedure: ESOPHAGOGASTRODUODENOSCOPY (EGD);  Surgeon: Carol Ada, MD;  Location: [facility] ENDOSCOPY;  Service: Endoscopy;  Laterality: N/A;  . Colonoscopy N/A 08/17/2014    Procedure: COLONOSCOPY;  Surgeon: Carol Ada, MD;  [facility] ENDOSCOPY;  Service: Endoscopy;  Laterality: N/A;   No family history on file. Social History  Substance Use Topics  . Smoking status: Never Smoker   . Smokeless tobacco: None  . Alcohol Use: No    Review of Systems  Constitutional: Positive for chills and appetite change. Negative for fever.  HENT: Negative for sore throat.   Eyes: Negative for visual disturbance.  Respiratory: Negative for shortness of breath.   Cardiovascular: Negative for chest pain.  Gastrointestinal: Positive for nausea, abdominal pain and diarrhea. Negative for vomiting, constipation, blood in stool and anal bleeding.  Genitourinary: Negative for difficulty urinating.  Musculoskeletal: Negative for back pain and neck stiffness.  Skin: Negative for rash.  Neurological: Negative for syncope and headaches.      Allergies  Review of patient's allergies indicates no known allergies.  Home Medications   Prior to Admission medications   Medication Sig Start Date End Date Taking? Authorizing Provider  acetaminophen (TYLENOL) 500 MG  tablet Take 1,000 mg by mouth every 6 (six) hours as needed for mild pain or moderate pain.   Yes Historical Provider, MD  amLODipine (NORVASC) 10 MG tablet Take 10 mg by mouth at bedtime. For blood pressure    Yes Historical Provider, MD  atorvastatin (LIPITOR) 40 MG tablet Take 40 mg by  mouth daily.    Yes Historical Provider, MD  B Complex-C-Folic Acid (DIALYVITE TABLET) TABS Take 1 tablet by mouth daily.     Yes Historical Provider, MD  calcium acetate (PHOSLO) 667 MG capsule Take 1,334 mg by mouth 3 (three) times daily with meals. And 1 cap with snack   Yes Historical Provider, MD  clotrimazole (LOTRIMIN) 1 % cream APPLY TO AFFECTED AREA 2 TIMES A DAY 11/25/14  Yes Historical Provider, MD  colchicine 0.6 MG tablet Take 0.6 mg by mouth 3 (three) times a week. Monday, wednesday, friday   Yes Historical Provider, MD  dexlansoprazole (DEXILANT) 60 MG capsule Take 60 mg by mouth daily.   Yes Historical Provider, MD  FOSRENOL 1000 MG chewable tablet Chew 1,000 mg by mouth 2 (two) times daily with a meal.  01/03/15  Yes Historical Provider, MD  hydrOXYzine (ATARAX/VISTARIL) 25 MG tablet TAKE 1 TABLET BY MOUTH EVERY 6 HOURS AS NEEDED FOR ITCHING 01/02/15  Yes Historical Provider, MD  losartan (COZAAR) 25 MG tablet Take 25 mg by mouth daily. 11/13/14  Yes Historical Provider, MD  metoprolol (TOPROL-XL) 200 MG 24 hr tablet Take 100 mg by mouth at bedtime.    Yes Historical Provider, MD  mirtazapine (REMERON) 7.5 MG tablet Take 7.5 mg by mouth daily. 11/22/14  Yes Historical Provider, MD  Nutritional Supplements (FEEDING SUPPLEMENT, NEPRO CARB STEADY,) LIQD Take 237 mLs by mouth 2 (two) times daily between meals. 12/11/14  Yes Albertine Patricia, MD  omeprazole (PRILOSEC) 20 MG capsule Take 20 mg by mouth daily.     Yes Historical Provider, MD  SENSIPAR 60 MG tablet Take 60 mg by mouth daily. 11/07/14  Yes Historical Provider, MD  triamcinolone cream (KENALOG) 0.1 % Apply 1 application topically 2 (two) times daily.  11/23/14  Yes Historical Provider, MD  metoprolol succinate (TOPROL-XL) 100 MG 24 hr tablet  12/28/14   Historical Provider, MD  mupirocin ointment (BACTROBAN) 2 % Place 1 application into the nose 2 (two) times daily. Patient not taking: Reported on 05/08/2015 11/25/14   Wandra Arthurs, MD   traMADol (ULTRAM) 50 MG tablet Take 50 mg by mouth every 6 (six) hours as needed for moderate pain.  11/21/14   Historical Provider, MD  vancomycin (VANCOCIN) 250 MG capsule Take 1 capsule (250 mg total) by mouth 4 (four) times daily. Patient not taking: Reported on 06/26/2015 03/06/15   Michel Bickers, MD   BP 98/59 mmHg  Pulse 118  Temp(Src) 96.9 F (36.1 C) (Oral)  Resp 21  SpO2 100% Physical Exam  Constitutional: He is oriented to person, place, and time. He appears cachectic. He appears ill (chronic). No distress.  HENT:  Head: Normocephalic and atraumatic.  Mouth/Throat: Mucous membranes are dry.  Eyes: Conjunctivae and EOM are normal.  Neck: Normal range of motion.  Cardiovascular: Normal rate, regular rhythm, normal heart sounds and intact distal pulses.  Exam reveals no gallop and no friction rub.   No murmur heard. Pulmonary/Chest: Effort normal and breath sounds normal. No respiratory distress. He has no wheezes. He has no rales.  Abdominal: Soft. He exhibits no distension. There is tenderness. There is guarding (  bialteral lower quadrants).  Musculoskeletal: He exhibits no edema.  Neurological: He is alert and oriented to person, place, and time.  Skin: Skin is warm and dry. He is not diaphoretic.  Nursing note and vitals reviewed.   ED Course  Procedures (including critical care time) Labs Review Labs Reviewed  CBC WITH DIFFERENTIAL/PLATELET - Abnormal; Notable for the following:    RBC 3.85 (*)    Hemoglobin 10.6 (*)    HCT 32.0 (*)    Platelets 141 (*)    Lymphs Abs 0.5 (*)    All other components within normal limits  COMPREHENSIVE METABOLIC PANEL - Abnormal; Notable for the following:    Chloride 91 (*)    Glucose, Bld 61 (*)    BUN 28 (*)    Creatinine, Ser 6.28 (*)    Albumin 2.9 (*)    AST 47 (*)    GFR calc non Af Amer 8 (*)    GFR calc Af Amer 9 (*)    Anion gap 26 (*)    All other components within normal limits  C DIFFICILE QUICK SCREEN W PCR  REFLEX  LIPASE, BLOOD    Imaging Review No results found. I have personally reviewed and evaluated these images and lab results as part of my medical decision-making.   EKG Interpretation None      MDM   Final diagnoses:  Abdominal pain   80 year old male with history of ESRD on dialysis Monday Wednesday Friday, osteomyelitis/discitis (resolved per ID note 1/31), hypertension, hypercholesterolemia, presents with concern for abdominal pain, nausea and diarrhea. Patient appears dry on exam and on dialysis he is given 500 mL of normal saline. He is given Zofran for nausea. CT abdomen and abdomen was obtained to evaluate for sign of diverticulitis. Ordered C. Difficile given history of possible C. Diff in recent past for which he was also followed by ID, however if unable to obtain sample feel this is less likely.  Patient care transferred to Dr. Jeanell Sparrow with CT abdomen and reevaluation pending.        Gareth Morgan, MD 07/01/2015 1820

## 2015-06-19 NOTE — ED Notes (Addendum)
Patient here with diarrhea that started last pm and vomiting since am, had last dialysis yesterday but only partial treatment due to low BP.  Patient with extreme weakness and abdominal pain

## 2015-06-19 NOTE — ED Provider Notes (Signed)
This is a 80 year old man on dialysis Monday Wednesday Friday who presents today complaining of vomiting and diarrhea that began yesterday. He was initially seen and evaluated by Dr. Billy Fischer. Labs obtained and CT obtained. I followed up on the CT and rechecked the patient.  Labs are consistent with the patient's renal failure. Potassium is normal. He does not have a leukocytosis although he does have a bandemia. CT results consistent with partial obstruction and ileitis. Patient is not currently only vomiting. He is resting comfortably in the bed. He is being given IV antibiotics. Consultation to hospitalist for admission for treatment.  Pattricia Boss, MD 06/21/15 [phone]

## 2015-06-19 NOTE — H&P (Signed)
[facility] Admission History and Physical       MACKY MASSOTH [id number] DOB: 12-22-1935 DOA: 06/17/2015  Referring physician: EDP PCP: Willene Hatchet, NP  Specialists:   Chief Complaint: ABD Pain and Diarrhea  HPI: MONTEZ NEUDECKER is a 80 y.o. male with a history of ESRD on HD (MWF), HTN, Gout who presents to the ED with complaints of ABD pain and Diarrhea x 2 days.  He denies any Fever or Chills.   He was evaluated in the ED and A CT scn of the ABD was performed and revealed Enteritis changes.  He was placed on IV Cipro and Flagyl and Enteric precautions.      Review of Systems:    Constitutional: No Weight Loss, No Weight Gain, Night Sweats, Fevers, Chills, Dizziness, Light Headedness, Fatigue, or Generalized Weakness HEENT: No Headaches, Difficulty Swallowing,Tooth/Dental Problems,Sore Throat,  No Sneezing, Rhinitis, Ear Ache, Nasal Congestion, or Post Nasal Drip,  Cardio-vascular:  No Chest pain, Orthopnea, PND, Edema in Lower Extremities, Anasarca, Dizziness, Palpitations  Resp: No Dyspnea, No DOE, No Productive Cough, No Non-Productive Cough, No Hemoptysis, No Wheezing.    GI: No Heartburn, Indigestion, +Abdominal Pain, Nausea, Vomiting,+Diarrhea, Constipation, Hematemesis, Hematochezia, Melena, Change in Bowel Habits,  Loss of Appetite  GU: No Dysuria, No Change in Color of Urine, No Urgency or Urinary Frequency, No Flank pain.  Musculoskeletal: No Joint Pain or Swelling, No Decreased Range of Motion, No Back Pain.  Neurologic: No Syncope, No Seizures, Muscle Weakness, Paresthesia, Vision Disturbance or Loss, No Diplopia, No Vertigo, No Difficulty Walking,  Skin: No Rash or Lesions. Psych: No Change in Mood or Affect, No Depression or Anxiety, No Memory loss, No Confusion, or Hallucinations   Past Medical History  Diagnosis Date  . Hypertension   . High cholesterol   . Gout   . Anemia   . Renal disorder     End stage renal disease secondary to  HTN nephrosclerosis  . Secondary hyperparathyroidism ([place])   . Hypocalcemia   . Colon polyps   . Paroxysmal supraventricular tachycardia ([place])   . Status post dilatation of esophageal stricture   . Syncope   . Rash and nonspecific skin eruption 01/09/2015  . Loss of weight 01/09/2015  . Poor appetite 01/09/2015     Past Surgical History  Procedure Laterality Date  . Right arm amputation     . Revision of arteriovenous goretex graft Left 12/15/2012    Procedure: REVISION OF ARTERIOVENOUS GORETEX GRAFT using 39mm x 20cm Gortex graft;  Surgeon: Mal Misty, MD;  Location: [place];  Service: Vascular;  Laterality: Left;  . Esophagogastroduodenoscopy N/A 01/27/2014    Procedure: ESOPHAGOGASTRODUODENOSCOPY (EGD);  Surgeon: Beryle Beams, MD;  Location: [facility];  Service: Endoscopy;  Laterality: N/A;  . Esophageal dilation N/A 01/27/2014    Procedure: ESOPHAGEAL DILATION;  Surgeon: Beryle Beams, MD;  Location: [facility] ENDOSCOPY;  Service: Endoscopy;  Laterality: N/A;  Balloon Dilation  . Esophagogastroduodenoscopy N/A 08/15/2014    Procedure: ESOPHAGOGASTRODUODENOSCOPY (EGD);  Surgeon: Carol Ada, MD;  Location: [facility] ENDOSCOPY;  Service: Endoscopy;  Laterality: N/A;  . Colonoscopy N/A 08/17/2014    Procedure: COLONOSCOPY;  Surgeon: Carol Ada, MD;  Location: [facility] ENDOSCOPY;  Service: Endoscopy;  Laterality: N/A;      Prior to Admission medications   Medication Sig Start Date End Date Taking? Authorizing Provider  acetaminophen (TYLENOL) 500 MG tablet Take 1,000 mg by mouth every 6 (six) hours as needed for mild pain or moderate pain.  Yes Historical Provider, MD  amLODipine (NORVASC) 10 MG tablet Take 10 mg by mouth at bedtime. For blood pressure    Yes Historical Provider, MD  atorvastatin (LIPITOR) 40 MG tablet Take 40 mg by mouth daily.    Yes Historical Provider, MD  B Complex-C-Folic Acid (DIALYVITE TABLET) TABS Take 1 tablet by mouth daily.     Yes Historical Provider, MD  calcium  acetate (PHOSLO) 667 MG capsule Take 1,334 mg by mouth 3 (three) times daily with meals. And 1 cap with snack   Yes Historical Provider, MD  clotrimazole (LOTRIMIN) 1 % cream APPLY TO AFFECTED AREA 2 TIMES A DAY 11/25/14  Yes Historical Provider, MD  colchicine 0.6 MG tablet Take 0.6 mg by mouth 3 (three) times a week. Monday, wednesday, friday   Yes Historical Provider, MD  dexlansoprazole (DEXILANT) 60 MG capsule Take 60 mg by mouth daily.   Yes Historical Provider, MD  FOSRENOL 1000 MG chewable tablet Chew 1,000 mg by mouth 2 (two) times daily with a meal.  01/03/15  Yes Historical Provider, MD  hydrOXYzine (ATARAX/VISTARIL) 25 MG tablet TAKE 1 TABLET BY MOUTH EVERY 6 HOURS AS NEEDED FOR ITCHING 01/02/15  Yes Historical Provider, MD  losartan (COZAAR) 25 MG tablet Take 25 mg by mouth daily. 11/13/14  Yes Historical Provider, MD  metoprolol (TOPROL-XL) 200 MG 24 hr tablet Take 100 mg by mouth at bedtime.    Yes Historical Provider, MD  mirtazapine (REMERON) 7.5 MG tablet Take 7.5 mg by mouth daily. 11/22/14  Yes Historical Provider, MD  Nutritional Supplements (FEEDING SUPPLEMENT, NEPRO CARB STEADY,) LIQD Take 237 mLs by mouth 2 (two) times daily between meals. 12/11/14  Yes Albertine Patricia, MD  omeprazole (PRILOSEC) 20 MG capsule Take 20 mg by mouth daily.     Yes Historical Provider, MD  SENSIPAR 60 MG tablet Take 60 mg by mouth daily. 11/07/14  Yes Historical Provider, MD  triamcinolone cream (KENALOG) 0.1 % Apply 1 application topically 2 (two) times daily.  11/23/14  Yes Historical Provider, MD  metoprolol succinate (TOPROL-XL) 100 MG 24 hr tablet  12/28/14   Historical Provider, MD  mupirocin ointment (BACTROBAN) 2 % Place 1 application into the nose 2 (two) times daily. Patient not taking: Reported on 05/08/2015 11/25/14   Wandra Arthurs, MD  traMADol (ULTRAM) 50 MG tablet Take 50 mg by mouth every 6 (six) hours as needed for moderate pain.  11/21/14   Historical Provider, MD  vancomycin (VANCOCIN) 250 MG  capsule Take 1 capsule (250 mg total) by mouth 4 (four) times daily. Patient not taking: Reported on 06/10/2015 03/06/15   Michel Bickers, MD     No Known Allergies     Social History:  reports that he has never smoked. He does not have any smokeless tobacco history on file. He reports that he does not drink alcohol or use illicit drugs.     No family history on file.     Physical Exam:  GEN:  Pleasant Cachectic Elderly   80 y.o. African American male examined and in no acute distress; cooperative with exam Filed Vitals:   06/15/2015 1915 06/07/2015 2045 06/14/2015 2122 06/09/2015 2130  BP: 104/70 90/78 89/66  85/69  Pulse: 72  70 81  Temp:      TempSrc:      Resp: 30 25 18 20   SpO2: 90%  99% 95%   Blood pressure 85/69, pulse 81, temperature 96.9 F (36.1 C), temperature source Oral, resp. rate 20, SpO2  95 %. PSYCH: He is alert and oriented x4; does not appear anxious does not appear depressed; affect is normal HEENT: Normocephalic and Atraumatic, Mucous membranes pink; PERRLA; EOM intact; Fundi:  Benign;  No scleral icterus, Nares: Patent, Oropharynx: Clear, Edentulous,    Neck:  FROM, No Cervical Lymphadenopathy nor Thyromegaly or Carotid Bruit; No JVD; Breasts:: Not examined CHEST WALL: No tenderness CHEST: Normal respiration, clear to auscultation bilaterally HEART: Regular rate and rhythm; no murmurs rubs or gallops BACK: No kyphosis or scoliosis; No CVA tenderness ABDOMEN: Positive Bowel Sounds, Scaphoid, Soft Non-Tender, No Rebound or Guarding; No Masses, No Organomegaly Rectal Exam: Not done EXTREMITIES: +RUE Amputation from Trauma in 1997,   No Cyanosis, Clubbing, or Edema; No Ulcerations. Genitalia: not examined PULSES: 2+ and symmetric SKIN: Normal hydration no rash or ulceration CNS:  Alert and Oriented x 4, No Focal Deficits Vascular: pulses palpable throughout    Labs on Admission:  Basic Metabolic Panel:  Recent Labs Lab 06/30/2015 1400  NA 142  K 4.3  CL 91*    CO2 25  GLUCOSE 61*  BUN 28*  CREATININE 6.28*  CALCIUM 9.2   Liver Function Tests:  Recent Labs Lab 06/08/2015 1400  AST 47*  ALT 24  ALKPHOS 105  BILITOT 1.1  PROT 7.2  ALBUMIN 2.9*    Recent Labs Lab 07/04/2015 1400  LIPASE 18   No results for input(s): AMMONIA in the last 168 hours. CBC:  Recent Labs Lab 06/27/2015 1400  WBC 6.5  NEUTROABS 5.6  HGB 10.6*  HCT 32.0*  MCV 83.1  PLT 141*   Cardiac Enzymes: No results for input(s): CKTOTAL, CKMB, CKMBINDEX, TROPONINI in the last 168 hours.  BNP (last 3 results) No results for input(s): BNP in the last 8760 hours.  ProBNP (last 3 results) No results for input(s): PROBNP in the last 8760 hours.  CBG:  Recent Labs Lab 07/05/2015 1744  GLUCAP 90    Radiological Exams on Admission: Ct Abdomen Pelvis Wo Contrast  06/15/2015  CLINICAL DATA:  Mid abdominal pain and diarrhea for several days. EXAM: CT ABDOMEN AND PELVIS WITHOUT CONTRAST TECHNIQUE: Multidetector CT imaging of the abdomen and pelvis was performed following the standard protocol without IV contrast. COMPARISON:  None. FINDINGS: Lower chest: No acute findings. Mild atelectasis and/or fibrosis at each lung base. Hepatobiliary: No mass visualized on this un-enhanced exam. Chronic benign-appearing dystrophic calcification within the left hepatic lobe. Gallbladder moderately distended but otherwise unremarkable. No bile duct dilatation. Pancreas: No mass or inflammatory process identified on this un-enhanced exam. Spleen: Within normal limits in size. Adrenals/Urinary Tract: Numerous renal cysts bilaterally. No renal stone or hydronephrosis bilaterally. No ureteral or bladder calculi identified. Bladder is decompressed. Stomach/Bowel: Moderately distended small bowel throughout the abdomen and pelvis, fluid-filled throughout, with most prominent distension within the upper abdomen. Thickening of the walls of the distal small bowel (terminal ileum) which is presumed  to be partially obstructive. Associated inflammation and fluid stranding within the right lower quadrant mesentery adjacent to the terminal ileum. Large bowel is relatively decompressed throughout. Vascular/Lymphatic: Atherosclerotic changes of the normal- caliber abdominal aorta and pelvic vasculature. No enlarged lymph nodes seen within the abdomen or pelvis. Reproductive: No mass or other significant abnormality. Other: No abscess collections seen. No free intraperitoneal air. No evidence of pneumatosis intestinalis. Musculoskeletal: Marked destructive changes centered at the L4-L5 disc space, compatible with sequela of previous discitis/osteomyelitis, previously documented on lumbar spine MRI of 12/05/2014. No new osseous abnormality. Superficial soft tissues are unremarkable.  IMPRESSION: 1. Moderately distended small bowel throughout the abdomen and pelvis, fluid-filled throughout, most prominent distension is of the upper abdominal small bowel and stomach. 2. Thickening of the walls of the distal small bowel (terminal ileum) is presumed to be partially obstructive given the dilatation of the more proximal small bowel and the relative decompression of the large bowel. This bowel wall thickening likely represents an enteritis of infectious, inflammatory or ischemic etiology, with associated inflammation and fluid stranding in the adjacent right lower quadrant mesentery. Recommend follow-up plain film to ensure the eventual passage of the oral contrast from the small bowel due to the large bowel thereby excluding a complete obstruction. 3. Additional chronic/incidental findings detailed above. Electronically Signed   By: Franki Cabot M.D.   On: 06/18/2015 20:28       Assessment/Plan:      80 y.o. male with  Principal Problem:    Enteritis    Enteric Precautions    IV Cipro and Flagyl   Active Problems:    ESRD on dialysis ([place])    Notify Renal in AM      HYPERTENSION, BENIGN  SYSTEMIC    Continue Metoprolol, Losartan Rx    Monitor BPs     Malnutrition     Nutrition Consult       Gout, unspecified    Hx             DVT Prophylaxis    SQ Heparin        Code Status:     FULL CODE      Family Communication:   No Family Present    Disposition Plan:    Inpatient Status with Expected LOS 2-3 days     Time spent:  [facility] Pager [phone]   If [place] Please Contact the Day Rounding Team MD for [facility]  If 7PM-7AM, Please Contact Night-Floor Coverage  [email or web address] Password [email or web address] 06/10/2015, 10:10 PM     ADDENDUM:   Patient was seen and examined on 07/06/2015

## 2015-06-20 ENCOUNTER — Inpatient Hospital Stay (HOSPITAL_COMMUNITY): Payer: Medicare Other

## 2015-06-20 DIAGNOSIS — R1013 Epigastric pain: Secondary | ICD-10-CM

## 2015-06-20 LAB — CBC
HEMATOCRIT: 29.5 % — AB (ref 39.0–52.0)
HEMOGLOBIN: 10.1 g/dL — AB (ref 13.0–17.0)
MCH: 28.3 pg (ref 26.0–34.0)
MCHC: 34.2 g/dL (ref 30.0–36.0)
MCV: 82.6 fL (ref 78.0–100.0)
Platelets: 142 10*3/uL — ABNORMAL LOW (ref 150–400)
RBC: 3.57 MIL/uL — ABNORMAL LOW (ref 4.22–5.81)
RDW: 15.1 % (ref 11.5–15.5)
WBC: 8.7 10*3/uL (ref 4.0–10.5)

## 2015-06-20 LAB — C DIFFICILE QUICK SCREEN W PCR REFLEX
C DIFFICLE (CDIFF) ANTIGEN: NEGATIVE
C Diff interpretation: NEGATIVE
C Diff toxin: NEGATIVE

## 2015-06-20 LAB — BASIC METABOLIC PANEL
ANION GAP: 23 — AB (ref 5–15)
BUN: 40 mg/dL — ABNORMAL HIGH (ref 6–20)
CHLORIDE: 86 mmol/L — AB (ref 101–111)
CO2: 31 mmol/L (ref 22–32)
Calcium: 8.2 mg/dL — ABNORMAL LOW (ref 8.9–10.3)
Creatinine, Ser: 6.86 mg/dL — ABNORMAL HIGH (ref 0.61–1.24)
GFR calc Af Amer: 8 mL/min — ABNORMAL LOW (ref >60)
GFR, EST NON AFRICAN AMERICAN: 7 mL/min — AB (ref >60)
GLUCOSE: 44 mg/dL — AB (ref 65–99)
POTASSIUM: 5.9 mmol/L — AB (ref 3.5–5.1)
Sodium: 140 mmol/L (ref 135–145)

## 2015-06-20 LAB — GLUCOSE, CAPILLARY: Glucose-Capillary: 92 mg/dL (ref 65–99)

## 2015-06-20 LAB — POTASSIUM: POTASSIUM: 4.8 mmol/L (ref 3.5–5.1)

## 2015-06-20 LAB — CBG MONITORING, ED
GLUCOSE-CAPILLARY: 40 mg/dL — AB (ref 65–99)
Glucose-Capillary: 100 mg/dL — ABNORMAL HIGH (ref 65–99)

## 2015-06-20 MED ORDER — PENTAFLUOROPROP-TETRAFLUOROETH EX AERO: 1.0000 "application " | INHALATION_SPRAY | CUTANEOUS | Status: DC | PRN

## 2015-06-20 MED ORDER — LIDOCAINE-PRILOCAINE 2.5-2.5 % EX CREA
1.0000 "application " | TOPICAL_CREAM | CUTANEOUS | Status: DC | PRN
  Filled 2015-06-20: qty 5

## 2015-06-20 MED ORDER — SODIUM CHLORIDE 0.9 % IV SOLN: 100.0000 mL | INTRAVENOUS | Status: DC | PRN

## 2015-06-20 MED ORDER — HYDROMORPHONE HCL 1 MG/ML IJ SOLN
INTRAMUSCULAR | Status: AC
  Filled 2015-06-20: qty 1

## 2015-06-20 MED ORDER — SODIUM CHLORIDE 0.9 % IV SOLN: 250.0000 mL | INTRAVENOUS | Status: DC | PRN

## 2015-06-20 MED ORDER — METOPROLOL SUCCINATE ER 100 MG PO TB24
100.0000 mg | ORAL_TABLET | Freq: Every day | ORAL | Status: DC
  Administered 2015-06-20 – 2015-06-22 (×3): 100 mg via ORAL
  Filled 2015-06-20 (×4): qty 1

## 2015-06-20 MED ORDER — DEXTROSE 50 % IV SOLN
1.0000 | INTRAVENOUS | Status: DC | PRN
  Administered 2015-06-20: 50 mL via INTRAVENOUS

## 2015-06-20 MED ORDER — HEPARIN SODIUM (PORCINE) 5000 UNIT/ML IJ SOLN
5000.0000 [IU] | Freq: Three times a day (TID) | INTRAMUSCULAR | Status: DC
  Administered 2015-06-20 – 2015-07-02 (×38): 5000 [IU] via SUBCUTANEOUS
  Filled 2015-06-20 (×31): qty 1

## 2015-06-20 MED ORDER — AMLODIPINE BESYLATE 10 MG PO TABS
10.0000 mg | ORAL_TABLET | Freq: Every day | ORAL | Status: DC
  Administered 2015-06-20 – 2015-06-22 (×3): 10 mg via ORAL
  Filled 2015-06-20 (×4): qty 1

## 2015-06-20 MED ORDER — ACETAMINOPHEN 325 MG PO TABS: 650.0000 mg | ORAL_TABLET | Freq: Four times a day (QID) | ORAL | Status: DC | PRN

## 2015-06-20 MED ORDER — SODIUM CHLORIDE 0.9% FLUSH
3.0000 mL | Freq: Two times a day (BID) | INTRAVENOUS | Status: DC
  Administered 2015-06-20 – 2015-06-21 (×3): 3 mL via INTRAVENOUS

## 2015-06-20 MED ORDER — CINACALCET HCL 30 MG PO TABS
60.0000 mg | ORAL_TABLET | Freq: Every day | ORAL | Status: DC
  Filled 2015-06-20: qty 2

## 2015-06-20 MED ORDER — OXYCODONE HCL 5 MG PO TABS
5.0000 mg | ORAL_TABLET | ORAL | Status: DC | PRN
  Administered 2015-06-21 – 2015-06-23 (×3): 5 mg via ORAL
  Filled 2015-06-20 (×3): qty 1

## 2015-06-20 MED ORDER — ONDANSETRON HCL 4 MG/2ML IJ SOLN
4.0000 mg | Freq: Four times a day (QID) | INTRAMUSCULAR | Status: DC | PRN
  Administered 2015-06-20 – 2015-06-23 (×3): 4 mg via INTRAVENOUS
  Filled 2015-06-20 (×3): qty 2

## 2015-06-20 MED ORDER — HYDROMORPHONE HCL 1 MG/ML IJ SOLN
0.5000 mg | INTRAMUSCULAR | Status: DC | PRN
  Administered 2015-06-20 – 2015-06-24 (×10): 1 mg via INTRAVENOUS
  Administered 2015-06-25: 0.5 mg via INTRAVENOUS
  Administered 2015-06-27: 1 mg via INTRAVENOUS
  Administered 2015-06-27: 0.5 mg via INTRAVENOUS
  Administered 2015-06-27 – 2015-07-03 (×12): 1 mg via INTRAVENOUS
  Filled 2015-06-20 (×22): qty 1

## 2015-06-20 MED ORDER — CALCIUM ACETATE (PHOS BINDER) 667 MG PO CAPS
1334.0000 mg | ORAL_CAPSULE | Freq: Three times a day (TID) | ORAL | Status: DC
  Administered 2015-06-20 (×2): 1334 mg via ORAL
  Filled 2015-06-20 (×2): qty 2

## 2015-06-20 MED ORDER — ACETAMINOPHEN 650 MG RE SUPP: 650.0000 mg | Freq: Four times a day (QID) | RECTAL | Status: DC | PRN

## 2015-06-20 MED ORDER — LIDOCAINE HCL (PF) 1 % IJ SOLN: 5.0000 mL | INTRAMUSCULAR | Status: DC | PRN

## 2015-06-20 MED ORDER — CINACALCET HCL 30 MG PO TABS
60.0000 mg | ORAL_TABLET | Freq: Every day | ORAL | Status: DC
  Administered 2015-06-20 – 2015-06-23 (×4): 60 mg via ORAL
  Filled 2015-06-20 (×5): qty 2

## 2015-06-20 MED ORDER — ATORVASTATIN CALCIUM 40 MG PO TABS
40.0000 mg | ORAL_TABLET | Freq: Every day | ORAL | Status: DC
  Administered 2015-06-20 – 2015-06-23 (×4): 40 mg via ORAL
  Filled 2015-06-20: qty 1
  Filled 2015-06-20: qty 4
  Filled 2015-06-20 (×2): qty 1

## 2015-06-20 MED ORDER — ONDANSETRON HCL 4 MG PO TABS: 4.0000 mg | ORAL_TABLET | Freq: Four times a day (QID) | ORAL | Status: DC | PRN

## 2015-06-20 MED ORDER — DEXTROSE 50 % IV SOLN
INTRAVENOUS | Status: AC
  Filled 2015-06-20: qty 50

## 2015-06-20 MED ORDER — SODIUM CHLORIDE 0.9% FLUSH: 3.0000 mL | INTRAVENOUS | Status: DC | PRN

## 2015-06-20 MED ORDER — NEPRO/CARBSTEADY PO LIQD
237.0000 mL | Freq: Two times a day (BID) | ORAL | Status: DC
  Administered 2015-06-20 (×2): 237 mL via ORAL
  Filled 2015-06-20: qty 237

## 2015-06-20 MED ORDER — MIRTAZAPINE 7.5 MG PO TABS
7.5000 mg | ORAL_TABLET | Freq: Every day | ORAL | Status: DC
  Administered 2015-06-20 – 2015-06-23 (×4): 7.5 mg via ORAL
  Filled 2015-06-20 (×5): qty 1

## 2015-06-20 MED ORDER — LANTHANUM CARBONATE 500 MG PO CHEW
1000.0000 mg | CHEWABLE_TABLET | Freq: Two times a day (BID) | ORAL | Status: DC
  Administered 2015-06-20 – 2015-06-22 (×2): 1000 mg via ORAL
  Filled 2015-06-20 (×4): qty 2

## 2015-06-20 MED ORDER — NEPHRO-VITE 0.8 MG PO TABS
1.0000 | ORAL_TABLET | Freq: Every day | ORAL | Status: DC
  Administered 2015-06-20: 1 via ORAL
  Filled 2015-06-20 (×3): qty 1

## 2015-06-20 NOTE — Progress Notes (Signed)
Patient ID: Brad [name], male   DOB: [birth year], 80 y.o.   MRN: [id number]  [facility] PROGRESS NOTE  FEDRICK SOUTHWARD [id number] DOB: 1935-06-30 DOA: 06/08/2015 PCP: Willene Hatchet, NP   Brief narrative:    80 y.o. male with a history of ESRD on HD (MWF), HTN, Gout who presented to the ED with ABD pain and diarrhea x 2 days.   In ED and A CT scan of the ABD revealed ? Enteritis, pt placed on IV Cipro and Flagyl and TR[facility] asked to admit for further evaluation.   Assessment/Plan:    Principal Problem:   Enteritis - continue flagyl and Cipro for now - GI consult for assistance  - keep NPO - abd XRAY requested - may need NGT  Active Problems:   Hyperkalemia: - address with HD - BMP in AM    ESRD  - MWF at [facility]. K+ 5.9 - appreciate nephrology team following   Hypertension/volume - BP actually lower than usual  - monitor  Anemia of chronic disease/Thrombocytopenia - CBC in AM  DVT prophylaxis  Code Status: Full.  Family Communication:  plan of care discussed with the patient Disposition Plan: Home by 3/18   IV access:  Peripheral IV  Procedures and diagnostic studies:    Ct Abdomen Pelvis Wo Contrast 06/12/2015  Moderately distended small bowel throughout the abdomen and pelvis, fluid-filled throughout, most prominent distension is of the upper abdominal small bowel and stomach. 2. Thickening of the walls of the distal small bowel (terminal ileum) is presumed to be partially obstructive given the dilatation of the more proximal small bowel and the relative decompression of the large bowel. This bowel wall thickening likely represents an enteritis of infectious, inflammatory or ischemic etiology, with associated inflammation and fluid stranding in the adjacent right lower quadrant mesentery. Recommend follow-up plain film to ensure the eventual passage of the oral contrast from the small bowel due to the large bowel thereby excluding a complete  obstruction. 3. Additional chronic/incidental findings detailed above.   Medical Consultants:  Nephrology   Other Consultants:  None  IAnti-Infectives:   Cipro 3/14 --> Flagyl 3/14 -->  Faye Ramsay, MD  [facility] Pager [phone]  If 7PM-7AM, please contact night-coverage [email or web address] Password [email or web address] 06/20/2015, 1:18 PM   LOS: 1 day   HPI/Subjective: No events overnight.   Objective: Filed Vitals:   06/20/15 1030 06/20/15 1100 06/20/15 1145 06/20/15 1239  BP: 110/77 109/69 122/71 132/91  Pulse:  59  56  Temp:    98.2 F (36.8 C)  TempSrc:    Oral  Resp: 21 16 18 18   Height:    5\' 9"  (1.753 m)  Weight:    48.8 kg (107 lb 9.4 oz)  SpO2: 100%   98%    Intake/Output Summary (Last 24 hours) at 06/20/15 1318 Last data filed at 06/20/15 1030  Gross per 24 hour  Intake      3 ml  Output      0 ml  Net      3 ml    Exam:   General:  Pt is alert, follows commands appropriately, not in acute distress  Cardiovascular: Regular rate and rhythm, no rubs, no gallops  Respiratory: Clear to auscultation bilaterally, no wheezing, no crackles, no rhonchi  Abdomen: Soft, non tender, non distended, bowel sounds present, no guarding   Data Reviewed: Basic Metabolic Panel:  Recent Labs Lab 06/23/2015 1400 06/20/15 0322  NA 142 140  K 4.3 5.9*  CL  91* 86*  CO2 25 31  GLUCOSE 61* 44*  BUN 28* 40*  CREATININE 6.28* 6.86*  CALCIUM 9.2 8.2*   Liver Function Tests:  Recent Labs Lab 06/12/2015 1400  AST 47*  ALT 24  ALKPHOS 105  BILITOT 1.1  PROT 7.2  ALBUMIN 2.9*    Recent Labs Lab 06/18/2015 1400  LIPASE 18   CBC:  Recent Labs Lab 06/06/2015 1400 06/20/15 0322  WBC 6.5 8.7  NEUTROABS 5.6  --   HGB 10.6* 10.1*  HCT 32.0* 29.5*  MCV 83.1 82.6  PLT 141* 142*   CBG:  Recent Labs Lab 06/09/2015 1744 06/20/15 0438 06/20/15 0537  GLUCAP 90 40* 100*    Recent Results (from the past 240 hour(s))  C difficile quick scan w PCR reflex     Status: None    Collection Time: 06/20/15  8:40 AM  Result Value Ref Range Status   C Diff antigen NEGATIVE NEGATIVE Final   C Diff toxin NEGATIVE NEGATIVE Final   C Diff interpretation Negative for toxigenic C. difficile  Final     Scheduled Meds: . amLODipine  10 mg Oral QHS  . atorvastatin  40 mg Oral Daily  . b complex-vitamin c-folic acid  1 tablet Oral Daily  . calcium acetate  1,334 mg Oral TID WC  . cinacalcet  60 mg Oral Q breakfast  . ciprofloxacin  400 mg Intravenous Q24H  . dextrose      . feeding supplement (NEPRO CARB STEADY)  237 mL Oral BID BM  . heparin  5,000 Units Subcutaneous 3 times per day  . lanthanum  1,000 mg Oral BID WC  . metoprolol  100 mg Oral QHS  . metronidazole  500 mg Intravenous Q8H  . mirtazapine  7.5 mg Oral Daily  . sodium chloride flush  3 mL Intravenous Q12H   Continuous Infusions:

## 2015-06-20 NOTE — ED Notes (Signed)
Meds requested from pharmacy.

## 2015-06-20 NOTE — Progress Notes (Signed)
New Admission Note: transfer from ED  Arrival Method: strtcher Mental Orientation: a/o/x4 Telemetry: none Assessment: Completed Skin: clean dry intact, R arm amputaiton IV: L foot KVO Pain: none Tubes: none Safety Measures: Safety Fall Prevention Plan has been given, discussed and signed Admission: Completed Unit Orientation: Patient has been orientated to the room, unit and staff.  Family: none present upon arrival.  Orders have been reviewed and implemented. Will continue to monitor the patient. Call light has been placed within reach and bed alarm has been activated.   Retta Mac BSN, RN

## 2015-06-20 NOTE — ED Notes (Signed)
Took over care of patient at this time

## 2015-06-20 NOTE — Consult Note (Signed)
[facility] Renal Consultation Note    Indication for Consultation:  Management of ESRD/hemodialysis; anemia, hypertension/volume and secondary hyperparathyroidism PCP: Willene Hatchet, NP   HPI: Brad [name] is a 80 y.o. male with ESRD on hemodialysis MWF at [facility]. Past medical history significant for hypertension, gout, secondary hyperparathyroidism, anemia of chronic disease, paroxysmal SVT, syncope, rashes, hypocalcemia, amputation of R arm. He presented to ED last PM after 2 day history of nausea, vomiting, diarrhea and abdominal pain. CT of the pelvis showed Thickening of the walls of the distal small bowel (terminalileum) is presumed to be partially obstructive given the dilatation of the more proximal small bowel and the relative decompression ofthe large bowel. This bowel wall thickening likely represents an enteritis of infectious, inflammatory or ischemic etiology, with associated inflammation and fluid stranding in the adjacent right lower quadrant mesentery. T-96.0 slightly hypotensive while in ED, WBC 6.5 HGB 10.6 Na 142 K+ 4.3. He was started on IV cipro and flagyl and is being admitted for enteritis.   Currently patient is still C/O abdominal pain in R and LLQs of abdomen. He says symptoms began about 2 days ago, with nausea, vomiting and diarrhea. "I was going at both ends". Denies chest pain, SOB, DOE, does C/O weakness and malaise. No C/O headache, vision changes, hearing changes, melena, hematochezia. His last HD treatment was 06/18/15 at [facility] where he stayed for his entire treatment and left 0.5 kg above EDW. K+ was 5.9 at 0322. Will have HD today on schedule for hyperkalemia.  Past Medical History  Diagnosis Date  . Hypertension   . High cholesterol   . Gout   . Anemia   . Renal disorder     End stage renal disease secondary to HTN nephrosclerosis  . Secondary hyperparathyroidism ([place])   . Hypocalcemia   . Colon polyps   .  Paroxysmal supraventricular tachycardia ([place])   . Status post dilatation of esophageal stricture   . Syncope   . Rash and nonspecific skin eruption 01/09/2015  . Loss of weight 01/09/2015  . Poor appetite 01/09/2015   Past Surgical History  Procedure Laterality Date  . Right arm amputation     . Revision of arteriovenous goretex graft Left 12/15/2012    Procedure: REVISION OF ARTERIOVENOUS GORETEX GRAFT using 12mm x 20cm Gortex graft;  Surgeon: Mal Misty, MD;  Location: [place];  Service: Vascular;  Laterality: Left;  . Esophagogastroduodenoscopy N/A 01/27/2014    Procedure: ESOPHAGOGASTRODUODENOSCOPY (EGD);  Surgeon: Beryle Beams, MD;  Location: [facility];  Service: Endoscopy;  Laterality: N/A;  . Esophageal dilation N/A 01/27/2014    Procedure: ESOPHAGEAL DILATION;  Surgeon: Beryle Beams, MD;  Location: [facility] ENDOSCOPY;  Service: Endoscopy;  Laterality: N/A;  Balloon Dilation  . Esophagogastroduodenoscopy N/A 08/15/2014    Procedure: ESOPHAGOGASTRODUODENOSCOPY (EGD);  Surgeon: Carol Ada, MD;  Location: [facility] ENDOSCOPY;  Service: Endoscopy;  Laterality: N/A;  . Colonoscopy N/A 08/17/2014    Procedure: COLONOSCOPY;  Surgeon: Carol Ada, MD;  Location: [facility] ENDOSCOPY;  Service: Endoscopy;  Laterality: N/A;   No family history on file. Social History:  reports that he has never smoked. He does not have any smokeless tobacco history on file. He reports that he does not drink alcohol or use illicit drugs. No Known Allergies Prior to Admission medications   Medication Sig Start Date End Date Taking? Authorizing Provider  acetaminophen (TYLENOL) 500 MG tablet Take 1,000 mg by mouth every 6 (six) hours as needed for mild  pain or moderate pain.   Yes Historical Provider, MD  amLODipine (NORVASC) 10 MG tablet Take 10 mg by mouth at bedtime. For blood pressure    Yes Historical Provider, MD  atorvastatin (LIPITOR) 40 MG tablet Take 40 mg by mouth daily.    Yes Historical Provider, MD  B  Complex-C-Folic Acid (DIALYVITE TABLET) TABS Take 1 tablet by mouth daily.     Yes Historical Provider, MD  calcium acetate (PHOSLO) 667 MG capsule Take 1,334 mg by mouth 3 (three) times daily with meals. And 1 cap with snack   Yes Historical Provider, MD  clotrimazole (LOTRIMIN) 1 % cream APPLY TO AFFECTED AREA 2 TIMES A DAY 11/25/14  Yes Historical Provider, MD  colchicine 0.6 MG tablet Take 0.6 mg by mouth 3 (three) times a week. Monday, wednesday, friday   Yes Historical Provider, MD  dexlansoprazole (DEXILANT) 60 MG capsule Take 60 mg by mouth daily.   Yes Historical Provider, MD  FOSRENOL 1000 MG chewable tablet Chew 1,000 mg by mouth 2 (two) times daily with a meal.  01/03/15  Yes Historical Provider, MD  hydrOXYzine (ATARAX/VISTARIL) 25 MG tablet TAKE 1 TABLET BY MOUTH EVERY 6 HOURS AS NEEDED FOR ITCHING 01/02/15  Yes Historical Provider, MD  losartan (COZAAR) 25 MG tablet Take 25 mg by mouth daily. 11/13/14  Yes Historical Provider, MD  metoprolol (TOPROL-XL) 200 MG 24 hr tablet Take 100 mg by mouth at bedtime.    Yes Historical Provider, MD  mirtazapine (REMERON) 7.5 MG tablet Take 7.5 mg by mouth daily. 11/22/14  Yes Historical Provider, MD  Nutritional Supplements (FEEDING SUPPLEMENT, NEPRO CARB STEADY,) LIQD Take 237 mLs by mouth 2 (two) times daily between meals. 12/11/14  Yes Albertine Patricia, MD  omeprazole (PRILOSEC) 20 MG capsule Take 20 mg by mouth daily.     Yes Historical Provider, MD  SENSIPAR 60 MG tablet Take 60 mg by mouth daily. 11/07/14  Yes Historical Provider, MD  triamcinolone cream (KENALOG) 0.1 % Apply 1 application topically 2 (two) times daily.  11/23/14  Yes Historical Provider, MD  metoprolol succinate (TOPROL-XL) 100 MG 24 hr tablet  12/28/14   Historical Provider, MD  mupirocin ointment (BACTROBAN) 2 % Place 1 application into the nose 2 (two) times daily. Patient not taking: Reported on 05/08/2015 11/25/14   Wandra Arthurs, MD  traMADol (ULTRAM) 50 MG tablet Take 50 mg by  mouth every 6 (six) hours as needed for moderate pain.  11/21/14   Historical Provider, MD  vancomycin (VANCOCIN) 250 MG capsule Take 1 capsule (250 mg total) by mouth 4 (four) times daily. Patient not taking: Reported on 06/26/2015 03/06/15   Michel Bickers, MD   Current Facility-Administered Medications  Medication Dose Route Frequency Provider Last Rate Last Dose  . 0.9 %  sodium chloride infusion  250 mL Intravenous PRN Theressa Millard, MD      . acetaminophen (TYLENOL) tablet 650 mg  650 mg Oral Q6H PRN Theressa Millard, MD       Or  . acetaminophen (TYLENOL) suppository 650 mg  650 mg Rectal Q6H PRN Theressa Millard, MD      . amLODipine (NORVASC) tablet 10 mg  10 mg Oral QHS Theressa Millard, MD   10 mg at 06/20/15 0453  . atorvastatin (LIPITOR) tablet 40 mg  40 mg Oral Daily Theressa Millard, MD   40 mg at 06/20/15 1057  . b complex-vitamin c-folic acid (NEPHRO-VITE) tablet 1 tablet  1 tablet  Oral Daily Theressa Millard, MD   1 tablet at 06/20/15 1031  . calcium acetate (PHOSLO) capsule 1,334 mg  1,334 mg Oral TID WC Theressa Millard, MD   1,334 mg at 06/20/15 1337  . cinacalcet (SENSIPAR) tablet 60 mg  60 mg Oral Q breakfast Theodis Blaze, MD   60 mg at 06/20/15 1037  . ciprofloxacin (CIPRO) IVPB 400 mg  400 mg Intravenous Q24H Conrad Grantfork, RPH      . dextrose 50 % solution 50 mL  1 ampule Intravenous PRN Gardiner Barefoot, NP   50 mL at 06/20/15 0440  . dextrose 50 % solution           . feeding supplement (NEPRO CARB STEADY) liquid 237 mL  237 mL Oral BID BM Romona Curls, RPH 0 mL/hr at 06/20/15 0700 237 mL at 06/20/15 1254  . heparin injection 5,000 Units  5,000 Units Subcutaneous 3 times per day Theressa Millard, MD   5,000 Units at 06/20/15 1300  . HYDROmorphone (DILAUDID) injection 0.5-1 mg  0.5-1 mg Intravenous Q3H PRN Theressa Millard, MD      . lanthanum (FOSRENOL) chewable tablet 1,000 mg  1,000 mg Oral BID WC Theressa Millard, MD   1,000 mg at  06/20/15 1036  . metoprolol succinate (TOPROL-XL) 24 hr tablet 100 mg  100 mg Oral QHS Theressa Millard, MD   100 mg at 06/20/15 0452  . metroNIDAZOLE (FLAGYL) IVPB 500 mg  500 mg Intravenous Q8H Theressa Millard, MD 100 mL/hr at 06/20/15 1244 500 mg at 06/20/15 1244  . mirtazapine (REMERON) tablet 7.5 mg  7.5 mg Oral Daily Theressa Millard, MD   7.5 mg at 06/20/15 1031  . ondansetron (ZOFRAN) tablet 4 mg  4 mg Oral Q6H PRN Theressa Millard, MD       Or  . ondansetron (ZOFRAN) injection 4 mg  4 mg Intravenous Q6H PRN Theressa Millard, MD   4 mg at 06/20/15 0448  . oxyCODONE (Oxy IR/ROXICODONE) immediate release tablet 5 mg  5 mg Oral Q4H PRN Theressa Millard, MD      . sodium chloride flush (NS) 0.9 % injection 3 mL  3 mL Intravenous Q12H Theressa Millard, MD   3 mL at 06/20/15 1030  . sodium chloride flush (NS) 0.9 % injection 3 mL  3 mL Intravenous PRN Theressa Millard, MD       Labs: Basic Metabolic Panel:  Recent Labs Lab 06/08/2015 1400 06/20/15 0322  NA 142 140  K 4.3 5.9*  CL 91* 86*  CO2 25 31  GLUCOSE 61* 44*  BUN 28* 40*  CREATININE 6.28* 6.86*  CALCIUM 9.2 8.2*   Liver Function Tests:  Recent Labs Lab 07/05/2015 1400  AST 47*  ALT 24  ALKPHOS 105  BILITOT 1.1  PROT 7.2  ALBUMIN 2.9*    Recent Labs Lab 06/07/2015 1400  LIPASE 18   No results for input(s): AMMONIA in the last 168 hours. CBC:  Recent Labs Lab 06/10/2015 1400 06/20/15 0322  WBC 6.5 8.7  NEUTROABS 5.6  --   HGB 10.6* 10.1*  HCT 32.0* 29.5*  MCV 83.1 82.6  PLT 141* 142*   Cardiac Enzymes: No results for input(s): CKTOTAL, CKMB, CKMBINDEX, TROPONINI in the last 168 hours. CBG:  Recent Labs Lab 06/25/2015 1744 06/20/15 0438 06/20/15 0537  GLUCAP 90 40* 100*   Iron Studies: No results for input(s): IRON, TIBC, TRANSFERRIN, FERRITIN in  the last 72 hours. Studies/Results: Ct Abdomen Pelvis Wo Contrast  06/17/2015  CLINICAL DATA:  Mid abdominal pain and diarrhea for  several days. EXAM: CT ABDOMEN AND PELVIS WITHOUT CONTRAST TECHNIQUE: Multidetector CT imaging of the abdomen and pelvis was performed following the standard protocol without IV contrast. COMPARISON:  None. FINDINGS: Lower chest: No acute findings. Mild atelectasis and/or fibrosis at each lung base. Hepatobiliary: No mass visualized on this un-enhanced exam. Chronic benign-appearing dystrophic calcification within the left hepatic lobe. Gallbladder moderately distended but otherwise unremarkable. No bile duct dilatation. Pancreas: No mass or inflammatory process identified on this un-enhanced exam. Spleen: Within normal limits in size. Adrenals/Urinary Tract: Numerous renal cysts bilaterally. No renal stone or hydronephrosis bilaterally. No ureteral or bladder calculi identified. Bladder is decompressed. Stomach/Bowel: Moderately distended small bowel throughout the abdomen and pelvis, fluid-filled throughout, with most prominent distension within the upper abdomen. Thickening of the walls of the distal small bowel (terminal ileum) which is presumed to be partially obstructive. Associated inflammation and fluid stranding within the right lower quadrant mesentery adjacent to the terminal ileum. Large bowel is relatively decompressed throughout. Vascular/Lymphatic: Atherosclerotic changes of the normal- caliber abdominal aorta and pelvic vasculature. No enlarged lymph nodes seen within the abdomen or pelvis. Reproductive: No mass or other significant abnormality. Other: No abscess collections seen. No free intraperitoneal air. No evidence of pneumatosis intestinalis. Musculoskeletal: Marked destructive changes centered at the L4-L5 disc space, compatible with sequela of previous discitis/osteomyelitis, previously documented on lumbar spine MRI of 12/05/2014. No new osseous abnormality. Superficial soft tissues are unremarkable. IMPRESSION: 1. Moderately distended small bowel throughout the abdomen and pelvis,  fluid-filled throughout, most prominent distension is of the upper abdominal small bowel and stomach. 2. Thickening of the walls of the distal small bowel (terminal ileum) is presumed to be partially obstructive given the dilatation of the more proximal small bowel and the relative decompression of the large bowel. This bowel wall thickening likely represents an enteritis of infectious, inflammatory or ischemic etiology, with associated inflammation and fluid stranding in the adjacent right lower quadrant mesentery. Recommend follow-up plain film to ensure the eventual passage of the oral contrast from the small bowel due to the large bowel thereby excluding a complete obstruction. 3. Additional chronic/incidental findings detailed above. Electronically Signed   By: Franki Cabot M.D.   On: 06/27/2015 20:28    ROS: As per HPI otherwise negative.  Physical Exam: Filed Vitals:   06/20/15 1030 06/20/15 1100 06/20/15 1145 06/20/15 1239  BP: 110/77 109/69 122/71 132/91  Pulse:  59  56  Temp:    98.2 F (36.8 C)  TempSrc:    Oral  Resp: 21 16 18 18   Height:    5\' 9"  (1.753 m)  Weight:    48.8 kg (107 lb 9.4 oz)  SpO2: 100%   98%     General: Thin, chronically ill appearing male in NAD.  Head: Normocephalic, atraumatic, sclera non-icteric, mucus membranes are moist Neck: Supple. JVD not elevated. Lungs: Clear bilaterally to auscultation without wheezes, rales, or rhonchi. Breathing is unlabored. Heart: RRR with S1 S2. II/VI systolic M.  Abdomen: Soft,nondistended, tender to palpation LLQ and RLQ. Faint, hypoactive BS.  M-S:  Strength and tone appear normal for age. Upper/Lower extremities:  R Arm amputation without edema or ischemic changes, no open wounds  Neuro: Alert and oriented X 3. Moves all extremities spontaneously. Psych:  Responds to questions appropriately with a normal affect. Dialysis Access: LFA AVG + bruit  Dialysis Orders:  Center: [place]  on MWF . 3 hrs 45 min, 180NRe Optiflux,  BFR 400, DFR Manual 800 mL/min, EDW 48 (kg), Dialysate 2.0 K, 2.0 Ca,  UFR Profile: Profile 4, Sodium Model: None, Access: LFA AV Graft Heparin: No heparin Mircera: 75 mcg IV q 2 weeks (recently restarted)  Last in-center Lab values: HGB 10.2 Fe 115 T Sat 55% Ca 8.2 C Ca 8.4 Phos 4.9 PTH 452 Albumin 3.8   Assessment/Plan: 1.  Abdominal Pain/Enteritis: per primary. On IV flagyl and Cipro. No fevers.  2.  Hyperkalemia: K+ 5.9. HD today on schedule 3.  ESRD -  MWF at [facility]. K+  5.9. Will have HD today off schedule for hyperkalemia.  4.  Hypertension/volume  -BP actually lower than usual for this pt-however this are issues with accuracy due to BPs done on lower extremities. On Amlodipine 10 mg PO Q hs and Metoprolol succ 50 mg PO Q hs. Will       attempt 1-1.5 liters today. Patient is 1.3 kg above EDW.  5.  Anemia  -HGB 10.1. Follow hgb. May need ESA dose.  6.  Metabolic bone disease -  No binders on OP med list. Last phos 4.9. Cont VDRA.  7.  Nutrition - Albumin 2.9. Currently NPO. Renal diet, prostat and renal vits when able to take POs. Dietary consult has been made for protein malnutrition.   Rita H. Owens Shark, NP-C 06/20/2015, 3:27 PM  [facility] Beeper [phone]  Renal Attending; I agree with note as articulated above. Shaylon Gillean C

## 2015-06-20 NOTE — ED Notes (Signed)
Pt being transported upstairs by Debbie, EMT 

## 2015-06-20 NOTE — Progress Notes (Signed)
Initial Nutrition Assessment  DOCUMENTATION CODES:   Severe malnutrition in context of chronic illness, Underweight  INTERVENTION:  Once diet advances, continue Nepro Shake po BID, each supplement provides 425 kcal and 19 grams protein.  Encourage adequate PO intake.   NUTRITION DIAGNOSIS:   Malnutrition related to chronic illness as evidenced by severe depletion of body fat, severe depletion of muscle mass.  GOAL:   Patient will meet greater than or equal to 90% of their needs  MONITOR:   Diet advancement, Supplement acceptance, Weight trends, Labs, I & O's, Skin  REASON FOR ASSESSMENT:   Consult Assessment of nutrition requirement/status  ASSESSMENT:   80 y.o. male with a history of ESRD on HD (MWF), HTN, Gout who presents to the ED with complaints of ABD pain and Diarrhea x 2 days. He denies any Fever or Chills. He was evaluated in the ED and A CT scn of the ABD was performed and revealed Enteritis changes.  Pt reports having a lack of appetite which has been ongoing over the past 2-3 days. Pt reports however still consuming at least 3 meals a day with a nutritional protein shake. Per Epic weight records, pt with a 10% weight loss in 7 months (not found significant for time frame). Pt reports it has been difficult to gain weight. Pt currently has Nepro Shake ordered and has been consuming them. Pt is currently NPO. RD to continue with current orders. Nursing staff to provide Nepro to pt once diet advances. Pt encouraged to continue with nutritional supplementation to aid in caloric and protein needs as well as in prevention of further weight loss.   Nutrition-Focused physical exam completed. Findings are severe fat depletion, severe muscle depletion, and no edema.   Labs: Low chloride, calcium, and GFR. High potassium, BUN, and creatinine.   Diet Order:  Diet NPO time specified  Skin:   (R arm amputation)  Last BM:  3/15  Height:   Ht Readings from Last 1  Encounters:  06/20/15 5\' 9"  (1.753 m)    Weight:   Wt Readings from Last 1 Encounters:  06/20/15 107 lb 9.4 oz (48.8 kg)    Ideal Body Weight:  67.9 kg (adjusted for R arm amputation)  BMI:  Body mass index is 15.88 kg/(m^2).  Estimated Nutritional Needs:   Kcal:  1700-1900  Protein:  75-85 grams  Fluid:  Per MD  EDUCATION NEEDS:   No education needs identified at this time  Corrin Parker, MS, RD, LDN Pager # [phone] After hours/ weekend pager # [phone]

## 2015-06-20 NOTE — ED Notes (Signed)
Critical CBG of 44; attempted to have pt drink apple juice, pt vomiting prior to giving juice.

## 2015-06-20 NOTE — Progress Notes (Signed)
06/20/2015 11:52 AM  Report received. Room is ready for patient.   [employer], RN-BC, [employer] [facility] Phone [phone]

## 2015-06-20 NOTE — Procedures (Signed)
Renal Attending: Tolerating HD treatment.  No hemodynamic issues.  Abd sl tender. Felicha Frayne C

## 2015-06-20 NOTE — ED Notes (Signed)
Rounding Soil scientist at the bedside

## 2015-06-21 ENCOUNTER — Inpatient Hospital Stay (HOSPITAL_COMMUNITY): Payer: Medicare Other

## 2015-06-21 LAB — BASIC METABOLIC PANEL
Anion gap: 18 — ABNORMAL HIGH (ref 5–15)
BUN: 19 mg/dL (ref 6–20)
CO2: 28 mmol/L (ref 22–32)
Calcium: 8.8 mg/dL — ABNORMAL LOW (ref 8.9–10.3)
Chloride: 93 mmol/L — ABNORMAL LOW (ref 101–111)
Creatinine, Ser: 3.71 mg/dL — ABNORMAL HIGH (ref 0.61–1.24)
GFR calc Af Amer: 17 mL/min — ABNORMAL LOW (ref >60)
GFR, EST NON AFRICAN AMERICAN: 14 mL/min — AB (ref >60)
GLUCOSE: 90 mg/dL (ref 65–99)
POTASSIUM: 3.9 mmol/L (ref 3.5–5.1)
Sodium: 139 mmol/L (ref 135–145)

## 2015-06-21 LAB — CBC
HCT: 26.9 % — ABNORMAL LOW (ref 39.0–52.0)
Hemoglobin: 9.3 g/dL — ABNORMAL LOW (ref 13.0–17.0)
MCH: 28.3 pg (ref 26.0–34.0)
MCHC: 34.6 g/dL (ref 30.0–36.0)
MCV: 81.8 fL (ref 78.0–100.0)
PLATELETS: 137 10*3/uL — AB (ref 150–400)
RBC: 3.29 MIL/uL — AB (ref 4.22–5.81)
RDW: 15.2 % (ref 11.5–15.5)
WBC: 11.4 10*3/uL — ABNORMAL HIGH (ref 4.0–10.5)

## 2015-06-21 LAB — MRSA PCR SCREENING: MRSA by PCR: NEGATIVE

## 2015-06-21 MED ORDER — RENA-VITE PO TABS
1.0000 | ORAL_TABLET | Freq: Every day | ORAL | Status: DC
  Administered 2015-06-21 – 2015-06-22 (×2): 1 via ORAL
  Filled 2015-06-21 (×3): qty 1

## 2015-06-21 MED ORDER — DARBEPOETIN ALFA 60 MCG/0.3ML IJ SOSY
60.0000 ug | PREFILLED_SYRINGE | INTRAMUSCULAR | Status: DC
  Filled 2015-06-21: qty 0.3

## 2015-06-21 NOTE — Progress Notes (Signed)
[facility] Progress Note   Assessment/Plan: 1. Abdominal Pain/Enteritis: per primary. On IV flagyl and Cipro. No fevers. Diarrhea/Vomiting improved, still C/O pain.  2. Hyperkalemia: K+ 5.9. HD yesterday, now 3.9. Resolved 3. ESRD - MWF at [facility]. Next tx 03/17.  4. Hypertension/volume -BP actually lower than usual for this pt-however this are issues with accuracy due to BPs done on lower extremities. On Amlodipine 10 mg PO Q hs and Metoprolol succ 50 mg PO Q hs. HD 03/15 Net UF 0 due to hypotension. Post wt not documented. Will weigh pt today and decide UFG for tomorrow.  5.  Anemia -HGB 9.3. Follow hgb. Will give ESA tomorrow if still in hospital.  6. Metabolic bone disease - No binders on OP med list. Last phos 4.9. Cont VDRA.  7. Nutrition - Albumin 2.9. Remains NPO. Renal diet, prostat and renal vits when able to take POs. Dietary consult has been made for protein malnutrition. Gem Brown NP-C 06/21/2015, 10:16 AM  [facility] [phone]  Renal Attending: I agree with the note as articulated above. Deandrae Wajda C    Subjective: "My stomach still hurts".  Denies vomiting/diarrhea. No chest pain/SOB.   Objective Filed Vitals:   06/20/15 2000 06/20/15 2132 06/21/15 0505 06/21/15 0820  BP: 127/82 135/64 101/66 101/59  Pulse: 103 77 70 98  Temp:  97.5 F (36.4 C) 98 F (36.7 C) 97.7 F (36.5 C)  TempSrc:  Oral Oral Oral  Resp: 20 18 18 17   Height:      Weight:      SpO2:  92% 90% 92%   Physical Exam General: Thin, Chronically ill appearing male in NAD Heart: S1,S2 II/VI M Lungs: bilateral breath sounds CTA Abdomen: no rebound tenderness, remains tender in lower quadrants, soft, nondistended Extremities: No LE edema. Has IV in foot.  Dialysis Access: LFA AVG + bruit  Dialysis Orders: Center: [facility] on MWF . 3 hrs 45 min, 180NRe Optiflux, BFR 400, DFR Manual 800 mL/min, EDW 48 (kg), Dialysate 2.0 K, 2.0 Ca, UFR Profile:  Profile 4, Sodium Model: None, Access: LFA AV Graft Heparin: No heparin Mircera: 75 mcg IV q 2 weeks (recently restarted)  Additional Objective Labs: Basic Metabolic Panel:  Recent Labs Lab 06/07/2015 1400 06/20/15 0322 06/20/15 1645 06/21/15 0556  NA 142 140  --  139  K 4.3 5.9* 4.8 3.9  CL 91* 86*  --  93*  CO2 25 31  --  28  GLUCOSE 61* 44*  --  90  BUN 28* 40*  --  19  CREATININE 6.28* 6.86*  --  3.71*  CALCIUM 9.2 8.2*  --  8.8*   Liver Function Tests:  Recent Labs Lab 06/30/2015 1400  AST 47*  ALT 24  ALKPHOS 105  BILITOT 1.1  PROT 7.2  ALBUMIN 2.9*    Recent Labs Lab 06/25/2015 1400  LIPASE 18   CBC:  Recent Labs Lab 06/28/2015 1400 06/20/15 0322 06/21/15 0556  WBC 6.5 8.7 11.4*  NEUTROABS 5.6  --   --   HGB 10.6* 10.1* 9.3*  HCT 32.0* 29.5* 26.9*  MCV 83.1 82.6 81.8  PLT 141* 142* 137*   Blood Culture    Component Value Date/Time   SDES BLOOD RIGHT FOOT 12/06/2014 1602   SPECREQUEST BOTTLES DRAWN AEROBIC ONLY  5CC 12/06/2014 1602   CULT NO GROWTH 5 DAYS 12/06/2014 1602   REPTSTATUS 12/11/2014 FINAL 12/06/2014 1602    Cardiac Enzymes: No results for input(s): CKTOTAL, CKMB, CKMBINDEX,  TROPONINI in the last 168 hours. CBG:  Recent Labs Lab 06/06/2015 1744 06/20/15 0438 06/20/15 0537 06/20/15 2001  GLUCAP 90 40* 100* 92   Iron Studies: No results for input(s): IRON, TIBC, TRANSFERRIN, FERRITIN in the last 72 hours. @lablastinr3 @ Studies/Results: Ct Abdomen Pelvis Wo Contrast  06/18/2015  CLINICAL DATA:  Mid abdominal pain and diarrhea for several days. EXAM: CT ABDOMEN AND PELVIS WITHOUT CONTRAST TECHNIQUE: Multidetector CT imaging of the abdomen and pelvis was performed following the standard protocol without IV contrast. COMPARISON:  None. FINDINGS: Lower chest: No acute findings. Mild atelectasis and/or fibrosis at each lung base. Hepatobiliary: No mass visualized on this un-enhanced exam. Chronic benign-appearing dystrophic calcification  within the left hepatic lobe. Gallbladder moderately distended but otherwise unremarkable. No bile duct dilatation. Pancreas: No mass or inflammatory process identified on this un-enhanced exam. Spleen: Within normal limits in size. Adrenals/Urinary Tract: Numerous renal cysts bilaterally. No renal stone or hydronephrosis bilaterally. No ureteral or bladder calculi identified. Bladder is decompressed. Stomach/Bowel: Moderately distended small bowel throughout the abdomen and pelvis, fluid-filled throughout, with most prominent distension within the upper abdomen. Thickening of the walls of the distal small bowel (terminal ileum) which is presumed to be partially obstructive. Associated inflammation and fluid stranding within the right lower quadrant mesentery adjacent to the terminal ileum. Large bowel is relatively decompressed throughout. Vascular/Lymphatic: Atherosclerotic changes of the normal- caliber abdominal aorta and pelvic vasculature. No enlarged lymph nodes seen within the abdomen or pelvis. Reproductive: No mass or other significant abnormality. Other: No abscess collections seen. No free intraperitoneal air. No evidence of pneumatosis intestinalis. Musculoskeletal: Marked destructive changes centered at the L4-L5 disc space, compatible with sequela of previous discitis/osteomyelitis, previously documented on lumbar spine MRI of 12/05/2014. No new osseous abnormality. Superficial soft tissues are unremarkable. IMPRESSION: 1. Moderately distended small bowel throughout the abdomen and pelvis, fluid-filled throughout, most prominent distension is of the upper abdominal small bowel and stomach. 2. Thickening of the walls of the distal small bowel (terminal ileum) is presumed to be partially obstructive given the dilatation of the more proximal small bowel and the relative decompression of the large bowel. This bowel wall thickening likely represents an enteritis of infectious, inflammatory or ischemic  etiology, with associated inflammation and fluid stranding in the adjacent right lower quadrant mesentery. Recommend follow-up plain film to ensure the eventual passage of the oral contrast from the small bowel due to the large bowel thereby excluding a complete obstruction. 3. Additional chronic/incidental findings detailed above. Electronically Signed   By: Franki Cabot M.D.   On: 06/25/2015 20:28   Dg Abd 2 Views  06/20/2015  CLINICAL DATA:  Small bowel obstruction.  Abdominal pain. EXAM: ABDOMEN - 2 VIEW COMPARISON:  CT abdomen and pelvis 06/08/2015. FINDINGS: Moderate gastric and proximal small bowel distension is again noted. Densities over the left upper quadrant are likely within the stomach. IMPRESSION: Moderate proximal small bowel and gastric distention persists. Electronically Signed   By: San Morelle M.D.   On: 06/20/2015 15:55   Medications:   . amLODipine  10 mg Oral QHS  . atorvastatin  40 mg Oral Daily  . cinacalcet  60 mg Oral Q breakfast  . ciprofloxacin  400 mg Intravenous Q24H  . feeding supplement (NEPRO CARB STEADY)  237 mL Oral BID BM  . heparin  5,000 Units Subcutaneous 3 times per day  . lanthanum  1,000 mg Oral BID WC  . metoprolol  100 mg Oral QHS  . metronidazole  500  mg Intravenous Q8H  . mirtazapine  7.5 mg Oral Daily  . multivitamin  1 tablet Oral QHS  . sodium chloride flush  3 mL Intravenous Q12H

## 2015-06-21 NOTE — Progress Notes (Signed)
Pharmacy Antibiotic Note  Brad [name] is a 80 y.o. male w/ ESRD on HD admitted on 06/21/2015 with intra-abdominal infection .  Pharmacy has been consulted for ciprofloxacin dosing.  The patient received HD on 3/15 evening. The patient is afebrile and WBC 11.4 - there are no expected dose adjustments.  Plan: 1. Continue Ciprofloxacin 400 mg IV every 24 hours 2. Pharmacy will sign off of the protocol as no dose adjustments are expected with ESRD  Height: 5\' 9"  (175.3 cm) Weight: 107 lb 5.8 oz (48.7 kg) IBW/kg (Calculated) : 70.7  Temp (24hrs), Avg:97.7 F (36.5 C), Min:97.2 F (36.2 C), Max:98.2 F (36.8 C)   Recent Labs Lab 06/23/2015 1400 06/20/15 0322 06/21/15 0556  WBC 6.5 8.7 11.4*  CREATININE 6.28* 6.86* 3.71*    Estimated Creatinine Clearance: 11.5 mL/min (by C-G formula based on Cr of 3.71).    No Known Allergies  Antimicrobials this admission: 3/14 cipro>> 3/14 Flagyl >>  Thank you for allowing pharmacy to be a part of this patient's care.  Alycia Rossetti, PharmD, BCPS Clinical Pharmacist Pager: [phone] 06/21/2015 11:32 AM

## 2015-06-21 NOTE — Progress Notes (Signed)
RN notified by nurse tech pt sats in the 60's upon assessment, pt did not appear to be in any acute distress, not SOB, no mental status changes, and poor readings on monitor. Continued to assess pt 100 % on Room air.

## 2015-06-21 NOTE — Consult Note (Signed)
Reason for Consult: Enteritis Referring Physician: [facility]  Ivonne Andrew HPI: This is a 80 year old male with multiple medical problems admitted for abdominal pain and diarrhea.  The patient had these symptoms two days prior to admission and it was gradual in onset.  No reports of any fever, nausea, vomiting, or sick contacts.  As a result of his symptoms further evaluation was performed in the ER and he was identified to have an enteritis.  This also appears to be causing a partial obstruction.  The follow up KUB did not reveal any significant improvement.  On 08/2014 a colonoscopy was performed for findings of heme positive stool.  The terminal ileum was intubated and it was negative for any inflammation.  An EGD was performed at that time with findings of AVMs and GAVE.  These issues were treated with APC.  Past Medical History  Diagnosis Date  . Hypertension   . High cholesterol   . Gout   . Anemia   . Renal disorder     End stage renal disease secondary to HTN nephrosclerosis  . Secondary hyperparathyroidism ([place])   . Hypocalcemia   . Colon polyps   . Paroxysmal supraventricular tachycardia ([place])   . Status post dilatation of esophageal stricture   . Syncope   . Rash and nonspecific skin eruption 01/09/2015  . Loss of weight 01/09/2015  . Poor appetite 01/09/2015    Past Surgical History  Procedure Laterality Date  . Right arm amputation     . Revision of arteriovenous goretex graft Left 12/15/2012    Procedure: REVISION OF ARTERIOVENOUS GORETEX GRAFT using 63mm x 20cm Gortex graft;  Surgeon: Mal Misty, MD;  Location: [place];  Service: Vascular;  Laterality: Left;  . Esophagogastroduodenoscopy N/A 01/27/2014    Procedure: ESOPHAGOGASTRODUODENOSCOPY (EGD);  Surgeon: Beryle Beams, MD;  Location: [facility] ENDOSCOPY;  Service: Endoscopy;  Laterality: N/A;  . Esophageal dilation N/A 01/27/2014    Procedure: ESOPHAGEAL DILATION;  Surgeon: Beryle Beams, MD;  Location: [facility]  ENDOSCOPY;  Service: Endoscopy;  Laterality: N/A;  Balloon Dilation  . Esophagogastroduodenoscopy N/A 08/15/2014    Procedure: ESOPHAGOGASTRODUODENOSCOPY (EGD);  Surgeon: Carol Ada, MD;  Location: [facility] ENDOSCOPY;  Service: Endoscopy;  Laterality: N/A;  . Colonoscopy N/A 08/17/2014    Procedure: COLONOSCOPY;  Surgeon: Carol Ada, MD;  Location: [facility] ENDOSCOPY;  Service: Endoscopy;  Laterality: N/A;    No family history on file.  Social History:  reports that he has never smoked. He does not have any smokeless tobacco history on file. He reports that he does not drink alcohol or use illicit drugs.  Allergies: No Known Allergies  Medications:  Scheduled: . amLODipine  10 mg Oral QHS  . atorvastatin  40 mg Oral Daily  . cinacalcet  60 mg Oral Q breakfast  . ciprofloxacin  400 mg Intravenous Q24H  . [START ON 06/22/2015] darbepoetin (ARANESP) injection - DIALYSIS  60 mcg Intravenous Q Fri-HD  . feeding supplement (NEPRO CARB STEADY)  237 mL Oral BID BM  . heparin  5,000 Units Subcutaneous 3 times per day  . lanthanum  1,000 mg Oral BID WC  . metoprolol  100 mg Oral QHS  . metronidazole  500 mg Intravenous Q8H  . mirtazapine  7.5 mg Oral Daily  . multivitamin  1 tablet Oral QHS  . sodium chloride flush  3 mL Intravenous Q12H   Continuous:   Results for orders placed or performed during the hospital encounter of 06/06/2015 (from  the past 24 hour(s))  Potassium     Status: None   Collection Time: 06/20/15  4:45 PM  Result Value Ref Range   Potassium 4.8 3.5 - 5.1 mmol/L  Glucose, capillary     Status: None   Collection Time: 06/20/15  8:01 PM  Result Value Ref Range   Glucose-Capillary 92 65 - 99 mg/dL  CBC     Status: Abnormal   Collection Time: 06/21/15  5:56 AM  Result Value Ref Range   WBC 11.4 (H) 4.0 - 10.5 K/uL   RBC 3.29 (L) 4.22 - 5.81 MIL/uL   Hemoglobin 9.3 (L) 13.0 - 17.0 g/dL   HCT 26.9 (L) 39.0 - 52.0 %   MCV 81.8 78.0 - 100.0 fL   MCH 28.3 26.0 - 34.0 pg   MCHC  34.6 30.0 - 36.0 g/dL   RDW 15.2 11.5 - 15.5 %   Platelets 137 (L) 150 - 400 K/uL  Basic metabolic panel     Status: Abnormal   Collection Time: 06/21/15  5:56 AM  Result Value Ref Range   Sodium 139 135 - 145 mmol/L   Potassium 3.9 3.5 - 5.1 mmol/L   Chloride 93 (L) 101 - 111 mmol/L   CO2 28 22 - 32 mmol/L   Glucose, Bld 90 65 - 99 mg/dL   BUN 19 6 - 20 mg/dL   Creatinine, Ser 3.71 (H) 0.61 - 1.24 mg/dL   Calcium 8.8 (L) 8.9 - 10.3 mg/dL   GFR calc non Af Amer 14 (L) >60 mL/min   GFR calc Af Amer 17 (L) >60 mL/min   Anion gap 18 (H) 5 - 15     Ct Abdomen Pelvis Wo Contrast  06/16/2015  CLINICAL DATA:  Mid abdominal pain and diarrhea for several days. EXAM: CT ABDOMEN AND PELVIS WITHOUT CONTRAST TECHNIQUE: Multidetector CT imaging of the abdomen and pelvis was performed following the standard protocol without IV contrast. COMPARISON:  None. FINDINGS: Lower chest: No acute findings. Mild atelectasis and/or fibrosis at each lung base. Hepatobiliary: No mass visualized on this un-enhanced exam. Chronic benign-appearing dystrophic calcification within the left hepatic lobe. Gallbladder moderately distended but otherwise unremarkable. No bile duct dilatation. Pancreas: No mass or inflammatory process identified on this un-enhanced exam. Spleen: Within normal limits in size. Adrenals/Urinary Tract: Numerous renal cysts bilaterally. No renal stone or hydronephrosis bilaterally. No ureteral or bladder calculi identified. Bladder is decompressed. Stomach/Bowel: Moderately distended small bowel throughout the abdomen and pelvis, fluid-filled throughout, with most prominent distension within the upper abdomen. Thickening of the walls of the distal small bowel (terminal ileum) which is presumed to be partially obstructive. Associated inflammation and fluid stranding within the right lower quadrant mesentery adjacent to the terminal ileum. Large bowel is relatively decompressed throughout. Vascular/Lymphatic:  Atherosclerotic changes of the normal- caliber abdominal aorta and pelvic vasculature. No enlarged lymph nodes seen within the abdomen or pelvis. Reproductive: No mass or other significant abnormality. Other: No abscess collections seen. No free intraperitoneal air. No evidence of pneumatosis intestinalis. Musculoskeletal: Marked destructive changes centered at the L4-L5 disc space, compatible with sequela of previous discitis/osteomyelitis, previously documented on lumbar spine MRI of 12/05/2014. No new osseous abnormality. Superficial soft tissues are unremarkable. IMPRESSION: 1. Moderately distended small bowel throughout the abdomen and pelvis, fluid-filled throughout, most prominent distension is of the upper abdominal small bowel and stomach. 2. Thickening of the walls of the distal small bowel (terminal ileum) is presumed to be partially obstructive given the dilatation of the more  proximal small bowel and the relative decompression of the large bowel. This bowel wall thickening likely represents an enteritis of infectious, inflammatory or ischemic etiology, with associated inflammation and fluid stranding in the adjacent right lower quadrant mesentery. Recommend follow-up plain film to ensure the eventual passage of the oral contrast from the small bowel due to the large bowel thereby excluding a complete obstruction. 3. Additional chronic/incidental findings detailed above. Electronically Signed   By: Franki Cabot M.D.   On: 06/13/2015 20:28   Dg Abd 2 Views  06/20/2015  CLINICAL DATA:  Small bowel obstruction.  Abdominal pain. EXAM: ABDOMEN - 2 VIEW COMPARISON:  CT abdomen and pelvis 06/17/2015. FINDINGS: Moderate gastric and proximal small bowel distension is again noted. Densities over the left upper quadrant are likely within the stomach. IMPRESSION: Moderate proximal small bowel and gastric distention persists. Electronically Signed   By: San Morelle M.D.   On: 06/20/2015 15:55    ROS:   As stated above in the HPI otherwise negative.  Blood pressure 101/59, pulse 98, temperature 97.7 F (36.5 C), temperature source Oral, resp. rate 17, height 5\' 9"  (1.753 m), weight 48.7 kg (107 lb 5.8 oz), SpO2 92 %.    PE: Gen: NAD, Alert and Oriented, frail appearning HEENT:  [place]/AT, EOMI Neck: Supple, no LAD Lungs: CTA Bilaterally, but distant lung sounds CV: RRR without M/G/R ABM: Soft, some tenderness in the RLQ, +BS Ext: No C/C/E  Assessment/Plan: 1) Enteritis - ? Etiology.  Possibly infectious. 2) ABM pain. 3) ESRD. 4) Hypoxemia - 87% on 10 liters [place].   The patient may have an infectious enteritis, but there is no significant change to his symptoms.  A colonoscopy cannot be performed at this time as there is the possibility of an obstruction.  I do not feel that he has IBD as the most recent colonoscopy was negative for this issue and the current area of enteritis was evaluated.  As I was in the room evaluating the patient he was identified to have hypoxemia by the tech.  I evaluated the reading and the equipment was having difficulty with picking up a signal, however, I was able to obtain a marginal signal at 87%.  He is not SOB and he reports feeling well at this time.  Plan: 1) Continue with antibiotics. 2) ? NG tube. 3) Contact Dr. Doyle Askew to inform her of the change in respiratory status.  I agree with starting a nonrebreather mask.  Reda Citron D 06/21/2015, 11:41 AM

## 2015-06-21 NOTE — Progress Notes (Signed)
Called to patient room for decreased O2 sat.  Pt was initially on NRB.  O2 sat 100%, pt removed from NRB and O2 sat still 100% on room air.  RT will continue to monitor.

## 2015-06-21 NOTE — Progress Notes (Signed)
Utilization review completed. Waylin Dorko, RN, BSN. 

## 2015-06-21 NOTE — Progress Notes (Addendum)
Patient ID: Brad [name], male   DOB: 08/18/1935, 80 y.o.   MRN: [id number]  [facility] PROGRESS NOTE  Brad [name] [id number] DOB: [birth year] DOA: 06/18/2015 PCP: Willene Hatchet, NP   Brief narrative:    80 y.o. male with a history of ESRD on HD (MWF), HTN, Gout who presented to the ED with ABD pain and diarrhea x 2 days.   In ED and A CT scan of the ABD revealed ? Enteritis, pt placed on IV Cipro and Flagyl and TRH asked to admit for further evaluation.   Assessment/Plan:    Principal Problem:   Enteritis - continue flagyl and Cipro for now - GI consulted, assistance is appreciated  - abd XRAY requested  Active Problems:   Hyperkalemia: - resolved  - BMP in AM    ESRD  - MWF at [facility] - appreciate nephrology team following   Hypertension/, essential  - reasonable inpatient control  - monitor  Anemia of chronic disease/Thrombocytopenia - overall stable - CBC In AM    Leukocytosis - CT chest requested to rule out PNA - CBC in AM    Underweight, Severe PCM - Body mass index is 15.85 kg/(m^2) - nutritionist consulted, appreciate recommendations   DVT prophylaxis - Heparin SQ  Code Status: Full.  Family Communication:  plan of care discussed with the patient Disposition Plan: Home by 3/18   IV access:  Peripheral IV  Procedures and diagnostic studies:    Ct Abdomen Pelvis Wo Contrast 06/08/2015  Moderately distended small bowel throughout the abdomen and pelvis, fluid-filled throughout, most prominent distension is of the upper abdominal small bowel and stomach. 2. Thickening of the walls of the distal small bowel (terminal ileum) is presumed to be partially obstructive given the dilatation of the more proximal small bowel and the relative decompression of the large bowel. This bowel wall thickening likely represents an enteritis of infectious, inflammatory or ischemic etiology, with associated inflammation and fluid stranding in the  adjacent right lower quadrant mesentery. Recommend follow-up plain film to ensure the eventual passage of the oral contrast from the small bowel due to the large bowel thereby excluding a complete obstruction. 3. Additional chronic/incidental findings detailed above.   Medical Consultants:  Nephrology  GI  Other Consultants:  None  IAnti-Infectives:   Cipro 3/14 --> Flagyl 3/14 -->  Faye Ramsay, MD  [facility] Pager [phone]  If 7PM-7AM, please contact night-coverage [email or web address] Password [facility] 06/21/2015, 8:28 PM   LOS: 2 days   HPI/Subjective: No events overnight.   Objective: Filed Vitals:   06/21/15 0820 06/21/15 1147 06/21/15 1216 06/21/15 1807  BP: 101/59 102/65  110/33  Pulse: 98 83 84 76  Temp: 97.7 F (36.5 C) 98.4 F (36.9 C)  98.1 F (36.7 C)  TempSrc: Oral Oral  Oral  Resp: 17 16  17   Height:      Weight:      SpO2: 92%  100% 98%    Intake/Output Summary (Last 24 hours) at 06/21/15 2028 Last data filed at 06/21/15 1904  Gross per 24 hour  Intake    690 ml  Output      0 ml  Net    690 ml    Exam:   General:  Pt is alert, follows commands appropriately, not in acute distress  Cardiovascular: Regular rate and rhythm, no rubs, no gallops  Respiratory: Clear to auscultation bilaterally, no wheezing, no crackles, no rhonchi  Abdomen: Soft, non tender, non distended, bowel sounds present,  no guarding   Data Reviewed: Basic Metabolic Panel:  Recent Labs Lab 06/06/2015 1400 06/20/15 0322 06/20/15 1645 06/21/15 0556  NA 142 140  --  139  K 4.3 5.9* 4.8 3.9  CL 91* 86*  --  93*  CO2 25 31  --  28  GLUCOSE 61* 44*  --  90  BUN 28* 40*  --  19  CREATININE 6.28* 6.86*  --  3.71*  CALCIUM 9.2 8.2*  --  8.8*   Liver Function Tests:  Recent Labs Lab 06/25/2015 1400  AST 47*  ALT 24  ALKPHOS 105  BILITOT 1.1  PROT 7.2  ALBUMIN 2.9*    Recent Labs Lab 07/04/2015 1400  LIPASE 18   CBC:  Recent Labs Lab 06/29/2015 1400  06/20/15 0322 06/21/15 0556  WBC 6.5 8.7 11.4*  NEUTROABS 5.6  --   --   HGB 10.6* 10.1* 9.3*  HCT 32.0* 29.5* 26.9*  MCV 83.1 82.6 81.8  PLT 141* 142* 137*   CBG:  Recent Labs Lab 07/06/2015 1744 06/20/15 0438 06/20/15 0537 06/20/15 2001  GLUCAP 90 40* 100* 92    Recent Results (from the past 240 hour(s))  C difficile quick scan w PCR reflex     Status: None   Collection Time: 06/20/15  8:40 AM  Result Value Ref Range Status   C Diff antigen NEGATIVE NEGATIVE Final   C Diff toxin NEGATIVE NEGATIVE Final   C Diff interpretation Negative for toxigenic C. difficile  Final  MRSA PCR Screening     Status: None   Collection Time: 06/21/15 11:51 AM  Result Value Ref Range Status   MRSA by PCR NEGATIVE NEGATIVE Final    Comment:        The GeneXpert MRSA Assay (FDA approved for NASAL specimens only), is one component of a comprehensive MRSA colonization surveillance program. It is not intended to diagnose MRSA infection nor to guide or monitor treatment for MRSA infections.      Scheduled Meds: . amLODipine  10 mg Oral QHS  . atorvastatin  40 mg Oral Daily  . cinacalcet  60 mg Oral Q breakfast  . ciprofloxacin  400 mg Intravenous Q24H  . [START ON 06/22/2015] darbepoetin (ARANESP) injection - DIALYSIS  60 mcg Intravenous Q Fri-HD  . feeding supplement (NEPRO CARB STEADY)  237 mL Oral BID BM  . heparin  5,000 Units Subcutaneous 3 times per day  . lanthanum  1,000 mg Oral BID WC  . metoprolol  100 mg Oral QHS  . metronidazole  500 mg Intravenous Q8H  . mirtazapine  7.5 mg Oral Daily  . multivitamin  1 tablet Oral QHS  . sodium chloride flush  3 mL Intravenous Q12H   Continuous Infusions:

## 2015-06-22 ENCOUNTER — Inpatient Hospital Stay (HOSPITAL_COMMUNITY): Payer: Medicare Other

## 2015-06-22 LAB — RENAL FUNCTION PANEL
ALBUMIN: 2.1 g/dL — AB (ref 3.5–5.0)
Anion gap: 15 (ref 5–15)
BUN: 25 mg/dL — AB (ref 6–20)
CALCIUM: 8.4 mg/dL — AB (ref 8.9–10.3)
CO2: 26 mmol/L (ref 22–32)
CREATININE: 3.56 mg/dL — AB (ref 0.61–1.24)
Chloride: 98 mmol/L — ABNORMAL LOW (ref 101–111)
GFR calc Af Amer: 18 mL/min — ABNORMAL LOW (ref >60)
GFR, EST NON AFRICAN AMERICAN: 15 mL/min — AB (ref >60)
GLUCOSE: 84 mg/dL (ref 65–99)
PHOSPHORUS: 2.5 mg/dL (ref 2.5–4.6)
Potassium: 3.5 mmol/L (ref 3.5–5.1)
SODIUM: 139 mmol/L (ref 135–145)

## 2015-06-22 LAB — CBC
HEMATOCRIT: 23.4 % — AB (ref 39.0–52.0)
Hemoglobin: 7.8 g/dL — ABNORMAL LOW (ref 13.0–17.0)
MCH: 27.4 pg (ref 26.0–34.0)
MCHC: 33.3 g/dL (ref 30.0–36.0)
MCV: 82.1 fL (ref 78.0–100.0)
PLATELETS: 108 10*3/uL — AB (ref 150–400)
RBC: 2.85 MIL/uL — ABNORMAL LOW (ref 4.22–5.81)
RDW: 15.3 % (ref 11.5–15.5)
WBC: 7 10*3/uL (ref 4.0–10.5)

## 2015-06-22 MED ORDER — SENNOSIDES-DOCUSATE SODIUM 8.6-50 MG PO TABS
1.0000 | ORAL_TABLET | Freq: Two times a day (BID) | ORAL | Status: DC
  Administered 2015-06-23: 1 via ORAL
  Filled 2015-06-22 (×4): qty 1

## 2015-06-22 MED ORDER — SODIUM CHLORIDE 0.9 % IV SOLN: 100.0000 mL | INTRAVENOUS | Status: DC | PRN

## 2015-06-22 MED ORDER — HYDROMORPHONE HCL 1 MG/ML IJ SOLN
INTRAMUSCULAR | Status: AC
Start: 2015-06-22 — End: 2015-06-22
  Filled 2015-06-22: qty 1

## 2015-06-22 MED ORDER — DARBEPOETIN ALFA 60 MCG/0.3ML IJ SOSY
PREFILLED_SYRINGE | INTRAMUSCULAR | Status: AC
  Administered 2015-06-22: 60 ug
  Filled 2015-06-22: qty 0.3

## 2015-06-22 NOTE — Evaluation (Signed)
Physical Therapy Evaluation Patient Details Name: Brad [name] MRN: [id number] DOB: 1935-09-26 Today's Date: 06/22/2015   History of Present Illness  80 yo male with onset of enteritis with liquid stool, has flexiseal and generalized weakness.  PMHx:  ESRD, anemia, gout, rash, R arm amputation  Clinical Impression  Pt is getting limited mobility done with restraint of his lines and RUE amputation with residual weakness and limited PO intake.  He is very appropriate for short stay rehab since wife cannot physically assist to the degree he will need, and his son is not continually available.  He is agreeable as he has been to short stay rehab before.    Follow Up Recommendations SNF    Equipment Recommendations  None recommended by PT    Recommendations for Other Services Rehab consult     Precautions / Restrictions Precautions Precautions: Fall (FLEXISEAL) Restrictions Weight Bearing Restrictions: No      Mobility  Bed Mobility Overal bed mobility: Needs Assistance Bed Mobility: Supine to Sit;Sit to Supine     Supine to sit: Mod assist Sit to supine: Mod assist   General bed mobility comments: trunk and hips supported to move, with bed pad due to flexi  Transfers Overall transfer level: Needs assistance Equipment used: 1 person hand held assist Transfers: Sit to/from Stand Sit to Stand: Min assist;Mod assist         General transfer comment: assisted to stand with LUE and sidesteps with stiff legged posture  Ambulation/Gait             General Gait Details: sidesteps on bedside only  Stairs            Wheelchair Mobility    Modified Rankin (Stroke Patients Only)       Balance Overall balance assessment: Needs assistance Sitting-balance support: Feet supported Sitting balance-Leahy Scale: Fair   Postural control: Posterior lean Standing balance support: Single extremity supported Standing balance-Leahy Scale: Poor                                Pertinent Vitals/Pain Pain Assessment: Faces Pain Score: 6  Faces Pain Scale: Hurts even more Pain Location: abdomen briefly Pain Intervention(s): Monitored during session    Home Living Family/patient expects to be discharged to:: Skilled nursing facility                 Additional Comments: Note that pt lives with wife and son, son works during the day and wife is unable to assist physically.      Prior Function Level of Independence: Needs assistance   Gait / Transfers Assistance Needed: short trips with no AD as pt did not like cane and has one UE  ADL's / Homemaking Assistance Needed: Pts wife assisted with bed level bathing and dressing due to fear of falling in shower.         Hand Dominance   Dominant Hand: Left    Extremity/Trunk Assessment   Upper Extremity Assessment: RUE deficits/detail (amputation RUE)           Lower Extremity Assessment: Generalized weakness      Cervical / Trunk Assessment: Normal  Communication   Communication: HOH  Cognition Arousal/Alertness: Awake/alert Behavior During Therapy: WFL for tasks assessed/performed Overall Cognitive Status: Within Functional Limits for tasks assessed                      General Comments  General comments (skin integrity, edema, etc.): Pt was able to mobilize with help and noted LLE had IV, has need for help to move to any position change esp with flexi complicating with LLE IV    Exercises        Assessment/Plan    PT Assessment Patient needs continued PT services  PT Diagnosis Generalized weakness   PT Problem List Decreased strength;Decreased range of motion;Decreased activity tolerance;Decreased balance;Decreased mobility;Decreased coordination;Cardiopulmonary status limiting activity;Pain;Decreased skin integrity;Decreased knowledge of precautions  PT Treatment Interventions DME instruction;Gait training;Functional mobility training;Therapeutic  activities;Therapeutic exercise;Balance training;Neuromuscular re-education;Patient/family education   PT Goals (Current goals can be found in the Care Plan section) Acute Rehab PT Goals Patient Stated Goal: to get up to walk PT Goal Formulation: With patient Time For Goal Achievement: 07/06/15 Potential to Achieve Goals: Good    Frequency Min 2X/week   Barriers to discharge Decreased caregiver support reduced help at home as pt cannot be lifted by wife    Co-evaluation               End of Session   Activity Tolerance: Patient tolerated treatment well;Patient limited by fatigue;Other (comment) (IV LLE and flexiseal) Patient left: in bed;with call bell/phone within reach;with bed alarm set Nurse Communication: Mobility status         Time: [id number] PT Time Calculation (min) (ACUTE ONLY): 25 min   Charges:   PT Evaluation $PT Eval Low Complexity: 1 Procedure PT Treatments $Therapeutic Activity: 8-22 mins   PT G Codes:        Ramond Dial July 05, 2015, 3:21 PM   Mee Hives, PT MS Acute Rehab Dept. Number: [id number] and [place] [phone]

## 2015-06-22 NOTE — Progress Notes (Signed)
   06/22/15 0900  PT Visit Information  Last PT Received On 06/22/15  Reason Eval/Treat Not Completed Patient at procedure or test/unavailable (HD)  Will check later to see if pt is available.  Mee Hives, PT MS Acute Rehab Dept. Number: [id number] and [place] [phone]

## 2015-06-22 NOTE — Progress Notes (Signed)
Patient ID: Brad [name], male   DOB: 1936-02-10, 80 y.o.   MRN: [id number]  [facility] PROGRESS NOTE  EMBER PULLUM [id number] DOB: 04-20-1935 DOA: 06/24/2015 PCP: Willene Hatchet, NP   Brief narrative:    80 y.o. male with a history of ESRD on HD (MWF), HTN, Gout who presented to the ED with ABD pain and diarrhea x 2 days.   In ED and A CT scan of the ABD revealed ? Enteritis, pt placed on IV Cipro and Flagyl and TRH asked to admit for further evaluation.   Assessment/Plan:    Principal Problem:   Enteritis with RLQ abd pain  - still reports pain mostly in the right lower quadrant area - continue flagyl and Cipro  - GI consulted, assistance is appreciated  - abd XRAY requested - provide analgesia as needed  - WBC is now WNL   Active Problems:   SBO - initially not very clear on imaging studies but abd XRAY notes improvement in obstruction  - will discuss with surgery team  - provide analgesia as needed    Hyperkalemia: - resolved  - BMP in AM    ESRD  - MWF at [facility] - appreciate nephrology team following   Hypertension, essential  - reasonable inpatient control  - monitor  Anemia of chronic disease/Thrombocytopenia - Hg drop in the past 24 hours, no signs of active bleeding - d/w GI team - CBC In AM    Leukocytosis - CT chest requested to rule out PNA - CBC in AM    Underweight, Severe PCM - Body mass index is 15.85 kg/(m^2) - nutritionist consulted, appreciate recommendations   DVT prophylaxis - Heparin SQ, may need to change to SCD's if further drop in Hg or Plt   Code Status: Full.  Family Communication:  plan of care discussed with the patient Disposition Plan: Home by 3/19  IV access:  Peripheral IV  Procedures and diagnostic studies:    Ct Abdomen Pelvis Wo Contrast 06/12/2015  Moderately distended small bowel throughout the abdomen and pelvis, fluid-filled throughout, most prominent distension is of the upper abdominal  small bowel and stomach. 2. Thickening of the walls of the distal small bowel (terminal ileum) is presumed to be partially obstructive given the dilatation of the more proximal small bowel and the relative decompression of the large bowel. This bowel wall thickening likely represents an enteritis of infectious, inflammatory or ischemic etiology, with associated inflammation and fluid stranding in the adjacent right lower quadrant mesentery. Recommend follow-up plain film to ensure the eventual passage of the oral contrast from the small bowel due to the large bowel thereby excluding a complete obstruction. 3. Additional chronic/incidental findings detailed above.  Dg Abd 1 View 06/22/2015   Improving small bowel obstruction.  Ct Chest Wo Contrast 06/21/2015  Mild emphysematous changes. 2. Bibasilar atelectasis. 3. Ectatic ascending aorta. Recommend annual imaging followup by CTA or MRA.   Medical Consultants:  Nephrology  GI Surgery over the phone to review images   Other Consultants:  None  IAnti-Infectives:   Cipro 3/14 --> Flagyl 3/14 -->  Faye Ramsay, MD  [facility] Pager [phone]  If 7PM-7AM, please contact night-coverage [email or web address] Password [email or web address] 06/22/2015, 4:30 PM   LOS: 3 days   HPI/Subjective: No events overnight.   Objective: Filed Vitals:   06/22/15 1000 06/22/15 1030 06/22/15 1100 06/22/15 1130  BP: 101/53 121/64 115/63 122/60  Pulse: 79 84 82 78  Temp:    98.2 F (36.8 C)  TempSrc:    Oral  Resp:    17  Height:      Weight:    48.3 kg (106 lb 7.7 oz)  SpO2:    98%    Intake/Output Summary (Last 24 hours) at 06/22/15 1630 Last data filed at 06/22/15 1130  Gross per 24 hour  Intake    730 ml  Output      0 ml  Net    730 ml    Exam:   General:  Pt is alert, follows commands appropriately, not in acute distress  Cardiovascular: Regular rate and rhythm, no rubs, no gallops  Respiratory: Clear to auscultation bilaterally, no wheezing, no  crackles, no rhonchi  Abdomen: Soft, tender in lower abd quadrants, non distended, bowel sounds faint, no guarding   Data Reviewed: Basic Metabolic Panel:  Recent Labs Lab 06/06/2015 1400 06/20/15 0322 06/20/15 1645 06/21/15 0556 06/22/15 0755  NA 142 140  --  139 139  K 4.3 5.9* 4.8 3.9 3.5  CL 91* 86*  --  93* 98*  CO2 25 31  --  28 26  GLUCOSE 61* 44*  --  90 84  BUN 28* 40*  --  19 25*  CREATININE 6.28* 6.86*  --  3.71* 3.56*  CALCIUM 9.2 8.2*  --  8.8* 8.4*  PHOS  --   --   --   --  2.5   Liver Function Tests:  Recent Labs Lab 06/30/2015 1400 06/22/15 0755  AST 47*  --   ALT 24  --   ALKPHOS 105  --   BILITOT 1.1  --   PROT 7.2  --   ALBUMIN 2.9* 2.1*    Recent Labs Lab 07/05/2015 1400  LIPASE 18   CBC:  Recent Labs Lab 06/11/2015 1400 06/20/15 0322 06/21/15 0556 06/22/15 0755  WBC 6.5 8.7 11.4* 7.0  NEUTROABS 5.6  --   --   --   HGB 10.6* 10.1* 9.3* 7.8*  HCT 32.0* 29.5* 26.9* 23.4*  MCV 83.1 82.6 81.8 82.1  PLT 141* 142* 137* 108*   CBG:  Recent Labs Lab 06/29/2015 1744 06/20/15 0438 06/20/15 0537 06/20/15 2001  GLUCAP 90 40* 100* 92    Recent Results (from the past 240 hour(s))  C difficile quick scan w PCR reflex     Status: None   Collection Time: 06/20/15  8:40 AM  Result Value Ref Range Status   C Diff antigen NEGATIVE NEGATIVE Final   C Diff toxin NEGATIVE NEGATIVE Final   C Diff interpretation Negative for toxigenic C. difficile  Final  MRSA PCR Screening     Status: None   Collection Time: 06/21/15 11:51 AM  Result Value Ref Range Status   MRSA by PCR NEGATIVE NEGATIVE Final    Comment:        The GeneXpert MRSA Assay (FDA approved for NASAL specimens only), is one component of a comprehensive MRSA colonization surveillance program. It is not intended to diagnose MRSA infection nor to guide or monitor treatment for MRSA infections.      Scheduled Meds: . amLODipine  10 mg Oral QHS  . atorvastatin  40 mg Oral Daily   . cinacalcet  60 mg Oral Q breakfast  . ciprofloxacin  400 mg Intravenous Q24H  . darbepoetin (ARANESP) injection - DIALYSIS  60 mcg Intravenous Q Fri-HD  . feeding supplement (NEPRO CARB STEADY)  237 mL Oral BID BM  . heparin  5,000 Units Subcutaneous 3 times per day  .  lanthanum  1,000 mg Oral BID WC  . metoprolol  100 mg Oral QHS  . metronidazole  500 mg Intravenous Q8H  . mirtazapine  7.5 mg Oral Daily  . multivitamin  1 tablet Oral QHS   Continuous Infusions:

## 2015-06-22 NOTE — Progress Notes (Signed)
Subjective: No significant abdominal pain.  He is feeling well.  Objective: Vital signs in last 24 hours: Temp:  [98.1 F (36.7 C)-98.4 F (36.9 C)] 98.2 F (36.8 C) (03/17 0734) Pulse Rate:  [72-84] 78 (03/17 0830) Resp:  [16-19] 16 (03/17 0734) BP: (102-131)/(33-74) 131/61 mmHg (03/17 0830) SpO2:  [97 %-100 %] 98 % (03/17 0734) Weight:  [48.3 kg (106 lb 7.7 oz)-48.5 kg (106 lb 14.8 oz)] 48.3 kg (106 lb 7.7 oz) (03/17 0734) Last BM Date: 06/20/15  Intake/Output from previous day: 03/16 0701 - 03/17 0700 In: 850 [P.O.:240; I.V.:110; IV Piggyback:500] Out: 0  Intake/Output this shift:    General appearance: alert and no distress GI: soft, non-tender; bowel sounds normal; no masses,  no organomegaly  Lab Results:  Recent Labs  06/20/15 0322 06/21/15 0556 06/22/15 0755  WBC 8.7 11.4* 7.0  HGB 10.1* 9.3* 7.8*  HCT 29.5* 26.9* 23.4*  PLT 142* 137* PENDING   BMET  Recent Labs  06/20/15 0322 06/20/15 1645 06/21/15 0556 06/22/15 0755  NA 140  --  139 139  K 5.9* 4.8 3.9 3.5  CL 86*  --  93* 98*  CO2 31  --  28 26  GLUCOSE 44*  --  90 84  BUN 40*  --  19 25*  CREATININE 6.86*  --  3.71* 3.56*  CALCIUM 8.2*  --  8.8* 8.4*   LFT  Recent Labs  06/21/2015 1400 06/22/15 0755  PROT 7.2  --   ALBUMIN 2.9* 2.1*  AST 47*  --   ALT 24  --   ALKPHOS 105  --   BILITOT 1.1  --    PT/INR No results for input(s): LABPROT, INR in the last 72 hours. Hepatitis Panel No results for input(s): HEPBSAG, HCVAB, HEPAIGM, HEPBIGM in the last 72 hours. C-Diff  Recent Labs  06/20/15 0840  CDIFFTOX NEGATIVE   Fecal Lactopherrin No results for input(s): FECLLACTOFRN in the last 72 hours.  Studies/Results: Ct Chest Wo Contrast  06/21/2015  CLINICAL DATA:  Hypoxia; pt unable to put arms up over head Hx of HTN, supraventricular tachycardia, anemia, renal disorder EXAM: CT CHEST WITHOUT CONTRAST TECHNIQUE: Multidetector CT imaging of the chest was performed following the  standard protocol without IV contrast. COMPARISON:  12/07/2014 and CT of the abdomen and pelvis 06/17/2015 FINDINGS: Heart: Extensive coronary artery calcifications present. Heart is mildly enlarged. No pericardial effusion identified. Vascular structures: There is atherosclerotic calcification of the thoracic aorta. Aorta is 4.3 cm maximum diameter. There is atherosclerotic calcification of the great vessels. Pulmonary artery is normal in appearance. Mediastinum/thyroid: The visualized portion of the thyroid gland has a normal appearance. No mediastinal, hilar, or axillary adenopathy. Lungs/Airways: Scattered emphysematous changes are identified within the lungs. No focal consolidation. There is mild bibasilar atelectasis. Upper abdomen: Distended stomach containing radiopaque debris. Small partially imaged left kidney with low-attenuation upper pole cyst. Coarse calcification identified within the posterior aspect of the left hepatic lobe. Visualized portion of the left adrenal gland is normal. Chest wall/osseous structures: Negative IMPRESSION: 1. Mild emphysematous changes. 2. Bibasilar atelectasis. 3. Ectatic ascending aorta. Recommend annual imaging followup by CTA or MRA. This recommendation follows 2010 ACCF/AHA/AATS/ACR/ASA/SCA/SCAI/SIR/STS/SVM Guidelines for the Diagnosis and Management of Patients with Thoracic Aortic Disease. Circulation. 2010; 121: [id number] 4. Distended stomach. 5. Small left kidney with renal cysts, partially imaged. 6. Coronary artery disease. Electronically Signed   By: Nolon Nations M.D.   On: 06/21/2015 13:35   Dg Abd 2 Views  06/20/2015  CLINICAL DATA:  Small bowel obstruction.  Abdominal pain. EXAM: ABDOMEN - 2 VIEW COMPARISON:  CT abdomen and pelvis 06/12/2015. FINDINGS: Moderate gastric and proximal small bowel distension is again noted. Densities over the left upper quadrant are likely within the stomach. IMPRESSION: Moderate proximal small bowel and gastric distention  persists. Electronically Signed   By: San Morelle M.D.   On: 06/20/2015 15:55    Medications:  Scheduled: . amLODipine  10 mg Oral QHS  . atorvastatin  40 mg Oral Daily  . cinacalcet  60 mg Oral Q breakfast  . ciprofloxacin  400 mg Intravenous Q24H  . darbepoetin (ARANESP) injection - DIALYSIS  60 mcg Intravenous Q Fri-HD  . feeding supplement (NEPRO CARB STEADY)  237 mL Oral BID BM  . heparin  5,000 Units Subcutaneous 3 times per day  . lanthanum  1,000 mg Oral BID WC  . metoprolol  100 mg Oral QHS  . metronidazole  500 mg Intravenous Q8H  . mirtazapine  7.5 mg Oral Daily  . multivitamin  1 tablet Oral QHS  . sodium chloride flush  3 mL Intravenous Q12H   Continuous:   Assessment/Plan: 1) Enteritis - presumed infectious. 2) ESRD. 3) Resolved hypoxemia.   From the GI standpoint he reports feeling better.  Only with deep palpation will he have abdominal pain.  No passage of flatus, but he thinks he had a bowel movement yesterday.    Plan: 1) Continue with Cipro/Flagyl. 2) Repeat KUB.   LOS: 3 days   Jailen Coward D 06/22/2015, 8:41 AM

## 2015-06-22 NOTE — Procedures (Signed)
Abd pain less, bur mild RLQ.  Not able to pull fluid.  Will keep even for now.   MWF 3 hrs 45 min, 180NRe Optiflux, BFR 400, DFR Manual 800 mL/min, EDW 48 (kg), Dialysate 2.0 K, 2.0 Ca,  Michi Herrmann C

## 2015-06-23 ENCOUNTER — Inpatient Hospital Stay (HOSPITAL_COMMUNITY): Payer: Medicare Other

## 2015-06-23 DIAGNOSIS — K529 Noninfective gastroenteritis and colitis, unspecified: Secondary | ICD-10-CM

## 2015-06-23 LAB — RENAL FUNCTION PANEL
ALBUMIN: 2.1 g/dL — AB (ref 3.5–5.0)
ANION GAP: 12 (ref 5–15)
BUN: 21 mg/dL — ABNORMAL HIGH (ref 6–20)
CO2: 27 mmol/L (ref 22–32)
Calcium: 8.3 mg/dL — ABNORMAL LOW (ref 8.9–10.3)
Chloride: 97 mmol/L — ABNORMAL LOW (ref 101–111)
Creatinine, Ser: 4.02 mg/dL — ABNORMAL HIGH (ref 0.61–1.24)
GFR calc Af Amer: 15 mL/min — ABNORMAL LOW (ref >60)
GFR, EST NON AFRICAN AMERICAN: 13 mL/min — AB (ref >60)
Glucose, Bld: 89 mg/dL (ref 65–99)
PHOSPHORUS: 2.3 mg/dL — AB (ref 2.5–4.6)
POTASSIUM: 5 mmol/L (ref 3.5–5.1)
Sodium: 136 mmol/L (ref 135–145)

## 2015-06-23 LAB — CBC
HEMATOCRIT: 27.6 % — AB (ref 39.0–52.0)
HEMOGLOBIN: 9.4 g/dL — AB (ref 13.0–17.0)
MCH: 27.9 pg (ref 26.0–34.0)
MCHC: 34.1 g/dL (ref 30.0–36.0)
MCV: 81.9 fL (ref 78.0–100.0)
Platelets: 101 10*3/uL — ABNORMAL LOW (ref 150–400)
RBC: 3.37 MIL/uL — ABNORMAL LOW (ref 4.22–5.81)
RDW: 15.7 % — AB (ref 11.5–15.5)
WBC: 7.2 10*3/uL (ref 4.0–10.5)

## 2015-06-23 NOTE — Progress Notes (Signed)
[facility] Progress Note Covering for Drs. Adriana Mccallum this weekend   Since last GI note: Vomiting this morning.  Still has abd pains, predominantly in RLQ.  Objective: Vital signs in last 24 hours: Temp:  [97 F (36.1 C)-98.6 F (37 C)] 97 F (36.1 C) (03/18 0818) Pulse Rate:  [77-85] 79 (03/18 0818) Resp:  [16-18] 17 (03/18 0818) BP: (115-152)/(46-71) 141/62 mmHg (03/18 0818) SpO2:  [97 %-100 %] 100 % (03/18 0818) Weight:  [106 lb 7.7 oz (48.3 kg)-107 lb 12.9 oz (48.9 kg)] 107 lb 12.9 oz (48.9 kg) (03/18 0553) Last BM Date: 06/22/15 General: alert and oriented times 3 Heart: regular rate and rythm Abdomen: soft, non-tender, non-distended, normal bowel sounds   Lab Results:  Recent Labs  06/21/15 0556 06/22/15 0755 06/23/15 0625  WBC 11.4* 7.0 7.2  HGB 9.3* 7.8* 9.4*  PLT 137* 108* 101*  MCV 81.8 82.1 81.9    Recent Labs  06/21/15 0556 06/22/15 0755 06/23/15 0625  NA 139 139 136  K 3.9 3.5 5.0  CL 93* 98* 97*  CO2 28 26 27   GLUCOSE 90 84 89  BUN 19 25* 21*  CREATININE 3.71* 3.56* 4.02*  CALCIUM 8.8* 8.4* 8.3*    Recent Labs  06/22/15 0755 06/23/15 0625  ALBUMIN 2.1* 2.1*   Medications: Scheduled Meds: . amLODipine  10 mg Oral QHS  . atorvastatin  40 mg Oral Daily  . cinacalcet  60 mg Oral Q breakfast  . ciprofloxacin  400 mg Intravenous Q24H  . darbepoetin (ARANESP) injection - DIALYSIS  60 mcg Intravenous Q Fri-HD  . feeding supplement (NEPRO CARB STEADY)  237 mL Oral BID BM  . heparin  5,000 Units Subcutaneous 3 times per day  . lanthanum  1,000 mg Oral BID WC  . metoprolol  100 mg Oral QHS  . metronidazole  500 mg Intravenous Q8H  . mirtazapine  7.5 mg Oral Daily  . multivitamin  1 tablet Oral QHS  . senna-docusate  1 tablet Oral BID   Continuous Infusions:  PRN Meds:.acetaminophen **OR** acetaminophen, dextrose, HYDROmorphone (DILAUDID) injection, ondansetron **OR** ondansetron (ZOFRAN) IV,  oxyCODONE    Assessment/Plan: 80 y.o. male with SBO from distal SB enteritis that is presumed to be infectious  Vomiting his clears today.  Still with RLQ pains  Will change to NPO, repeat KUB.  I will order.  If worsening on films then he will need NG tube   Milus Banister, MD  06/23/2015, 10:19 AM [facility] Pager [phone]

## 2015-06-23 NOTE — Progress Notes (Signed)
[facility] Progress Note  Assessment/Plan: 1. Abdominal Pain/Enteritis: per primary. On IV flagyl and Cipro. No fevers. Diarrhea/Vomiting improved, still C/O pain.  2. Hyperkalemia: . Resolved with HD 3. ESRD - MWF at [facility]. Next tx Monday 4. Hypertension/volume -. On Amlodipine 10 mg PO Q hs and Metoprolol succ 50 mg PO Q hs.Kept even Wed and Friday on HD with post weight 48.9 (EDW 48) 5. Anemia -HGB 9.4 stable. Follow hgb. Aranesp 60 3/17 6. Metabolic bone disease - No binders on OP med list. Last phos 4.9. Cont VDRA.  7. Nutrition - Albumin 2.9 on CL- has nepro ordered, diet can likely be advanced.   Myriam Jacobson, PA-C [place] 06/23/2015,9:57 AM  LOS: 4 days    Renal Attending : Agree with above.  Still with vomiting so now NPO.  Will follow and try to avoid excess UF Rajni Holsworth C   Subjective:   Wants something else to eat.  Hasn't had any BM. Not taking CL.Has pain that moves around: left shoulder, right ankle and right LQ.  Objective Filed Vitals:   06/22/15 1729 06/22/15 2106 06/23/15 0553 06/23/15 0818  BP: 122/62 152/46 137/71 141/62  Pulse: 77 85 83 79  Temp: 98.6 F (37 C) 98.6 F (37 C) 98.3 F (36.8 C) 97 F (36.1 C)  TempSrc: Oral Oral Oral Oral  Resp: 18 16 16 17   Height:      Weight:   48.9 kg (107 lb 12.9 oz)   SpO2: 97% 100% 100% 100%   Physical Exam General: thin, emaciated Heart: RRR Lungs: no rales Abdomen: soft RLQ tenderness Extremities: no LE edema  IV in LLE Dialysis Access: left AVF + bruit  Dialysis Orders: Center: [facility] on MWF . 3 hrs 45 min, 180NRe Optiflux, BFR 400, DFR Manual 800 mL/min, EDW 48 (kg), Dialysate 2.0 K, 2.0 Ca, UFR Profile: Profile 4, Sodium Model: None, Access: LFA AV Graft Heparin: No heparin Mircera: 75 mcg IV q 2 weeks (recently restarted)  Additional Objective Labs: Basic Metabolic Panel:  Recent Labs Lab 06/21/15 0556 06/22/15 0755  06/23/15 0625  NA 139 139 136  K 3.9 3.5 5.0  CL 93* 98* 97*  CO2 28 26 27   GLUCOSE 90 84 89  BUN 19 25* 21*  CREATININE 3.71* 3.56* 4.02*  CALCIUM 8.8* 8.4* 8.3*  PHOS  --  2.5 2.3*   Liver Function Tests:  Recent Labs Lab 06/22/2015 1400 06/22/15 0755 06/23/15 0625  AST 47*  --   --   ALT 24  --   --   ALKPHOS 105  --   --   BILITOT 1.1  --   --   PROT 7.2  --   --   ALBUMIN 2.9* 2.1* 2.1*    Recent Labs Lab 06/22/2015 1400  LIPASE 18   CBC:  Recent Labs Lab 06/09/2015 1400 06/20/15 0322 06/21/15 0556 06/22/15 0755 06/23/15 0625  WBC 6.5 8.7 11.4* 7.0 7.2  NEUTROABS 5.6  --   --   --   --   HGB 10.6* 10.1* 9.3* 7.8* 9.4*  HCT 32.0* 29.5* 26.9* 23.4* 27.6*  MCV 83.1 82.6 81.8 82.1 81.9  PLT 141* 142* 137* 108* 101*   CBG:  Recent Labs Lab 06/18/2015 1744 06/20/15 0438 06/20/15 0537 06/20/15 2001  GLUCAP 90 40* 100* 92   : Dg Abd 1 View  06/22/2015  CLINICAL DATA:  Lower abdominal pain and small bowel obstruction. EXAM: ABDOMEN - 1 VIEW COMPARISON:  06/19/2005 FINDINGS: Degree of small bowel dilatation appears improved with some residual dilatation remaining. There is some transit of diluted oral contrast into the colon. IMPRESSION: Improving small bowel obstruction. Electronically Signed   By: Aletta Edouard M.D.   On: 06/22/2015 14:57   Ct Chest Wo Contrast  06/21/2015  CLINICAL DATA:  Hypoxia; pt unable to put arms up over head Hx of HTN, supraventricular tachycardia, anemia, renal disorder EXAM: CT CHEST WITHOUT CONTRAST TECHNIQUE: Multidetector CT imaging of the chest was performed following the standard protocol without IV contrast. COMPARISON:  12/07/2014 and CT of the abdomen and pelvis 06/26/2015 FINDINGS: Heart: Extensive coronary artery calcifications present. Heart is mildly enlarged. No pericardial effusion identified. Vascular structures: There is atherosclerotic calcification of the thoracic aorta. Aorta is 4.3 cm maximum diameter. There is  atherosclerotic calcification of the great vessels. Pulmonary artery is normal in appearance. Mediastinum/thyroid: The visualized portion of the thyroid gland has a normal appearance. No mediastinal, hilar, or axillary adenopathy. Lungs/Airways: Scattered emphysematous changes are identified within the lungs. No focal consolidation. There is mild bibasilar atelectasis. Upper abdomen: Distended stomach containing radiopaque debris. Small partially imaged left kidney with low-attenuation upper pole cyst. Coarse calcification identified within the posterior aspect of the left hepatic lobe. Visualized portion of the left adrenal gland is normal. Chest wall/osseous structures: Negative IMPRESSION: 1. Mild emphysematous changes. 2. Bibasilar atelectasis. 3. Ectatic ascending aorta. Recommend annual imaging followup by CTA or MRA. This recommendation follows 2010 ACCF/AHA/AATS/ACR/ASA/SCA/SCAI/SIR/STS/SVM Guidelines for the Diagnosis and Management of Patients with Thoracic Aortic Disease. Circulation. 2010; 121: [id number] 4. Distended stomach. 5. Small left kidney with renal cysts, partially imaged. 6. Coronary artery disease. Electronically Signed   By: Nolon Nations M.D.   On: 06/21/2015 13:35   Medications:   . amLODipine  10 mg Oral QHS  . atorvastatin  40 mg Oral Daily  . cinacalcet  60 mg Oral Q breakfast  . ciprofloxacin  400 mg Intravenous Q24H  . darbepoetin (ARANESP) injection - DIALYSIS  60 mcg Intravenous Q Fri-HD  . feeding supplement (NEPRO CARB STEADY)  237 mL Oral BID BM  . heparin  5,000 Units Subcutaneous 3 times per day  . lanthanum  1,000 mg Oral BID WC  . metoprolol  100 mg Oral QHS  . metronidazole  500 mg Intravenous Q8H  . mirtazapine  7.5 mg Oral Daily  . multivitamin  1 tablet Oral QHS  . senna-docusate  1 tablet Oral BID

## 2015-06-23 NOTE — Progress Notes (Signed)
Patient ID: Brad [name], male   DOB: Aug 22, 1935, 80 y.o.   MRN: [id number]  [facility] PROGRESS NOTE  Brad [name] [id number] DOB: 1935-11-22 DOA: 06/29/2015 PCP: Willene Hatchet, NP   Brief narrative:    80 y.o. male with a history of ESRD on HD (MWF), HTN, Gout who presented to the ED with ABD pain and diarrhea x 2 days.   In ED and A CT scan of the ABD revealed ? Enteritis, pt placed on IV Cipro and Flagyl and TRH asked to admit for further evaluation.   Assessment/Plan:    Principal Problem:   Enteritis with RLQ abd pain  - still reports pain mostly in the right lower quadrant area - continue flagyl and Cipro  - GI consulted, assistance is appreciated  - abd XRAY requested again this AM due to more vomiting  - provide analgesia as needed  - keep NPO for now   Active Problems:   SBO - initially not very clear on imaging studies but abd XRAY 3/17 notes improvement in obstruction  - surgery paged, awaiting response  - ABX XRAY requested again as pt with more vomiting this AM  - NPO for now, may need NGT  - provide analgesia as needed    Hyperkalemia: - resolved  - BMP in AM    ESRD  - MWF at [facility] - appreciate nephrology team following   Hypertension, essential  - reasonable inpatient control  - monitor  Anemia of chronic disease/Thrombocytopenia - Hg improved since yesterday  - no sings of bleeding  - CBC In AM    Leukocytosis - CT chest with no signs of PNA, WBC is now WNL  - CBC in AM    Underweight, Severe PCM - Body mass index is 15.85 kg/(m^2) - nutritionist consulted, appreciate recommendations   DVT prophylaxis - Heparin SQ  Code Status: Full.  Family Communication:  plan of care discussed with the patient Disposition Plan: Home by 3/20  IV access:  Peripheral IV  Procedures and diagnostic studies:    Ct Abdomen Pelvis Wo Contrast 06/28/2015  Moderately distended small bowel throughout the abdomen and pelvis,  fluid-filled throughout, most prominent distension is of the upper abdominal small bowel and stomach. 2. Thickening of the walls of the distal small bowel (terminal ileum) is presumed to be partially obstructive given the dilatation of the more proximal small bowel and the relative decompression of the large bowel. This bowel wall thickening likely represents an enteritis of infectious, inflammatory or ischemic etiology, with associated inflammation and fluid stranding in the adjacent right lower quadrant mesentery. Recommend follow-up plain film to ensure the eventual passage of the oral contrast from the small bowel due to the large bowel thereby excluding a complete obstruction. 3. Additional chronic/incidental findings detailed above.  Dg Abd 1 View 06/22/2015   Improving small bowel obstruction.  Ct Chest Wo Contrast 06/21/2015  Mild emphysematous changes. 2. Bibasilar atelectasis. 3. Ectatic ascending aorta. Recommend annual imaging followup by CTA or MRA.   Medical Consultants:  Nephrology  GI Surgery over the phone to review images   Other Consultants:  None  IAnti-Infectives:   Cipro 3/14 --> Flagyl 3/14 -->  Faye Ramsay, MD  [facility] Pager [phone]  If 7PM-7AM, please contact night-coverage [email or web address] Password [facility] 06/23/2015, 2:31 PM   LOS: 4 days   HPI/Subjective: No events overnight. Vomiting this AM.   Objective: Filed Vitals:   06/22/15 2106 06/23/15 0553 06/23/15 0818 06/23/15 1202  BP: 152/46  137/71 141/62 118/64  Pulse: 85 83 79 80  Temp: 98.6 F (37 C) 98.3 F (36.8 C) 97 F (36.1 C) 98.7 F (37.1 C)  TempSrc: Oral Oral Oral Oral  Resp: 16 16 17 16   Height:      Weight:  48.9 kg (107 lb 12.9 oz)    SpO2: 100% 100% 100% 100%    Intake/Output Summary (Last 24 hours) at 06/23/15 1431 Last data filed at 06/23/15 1427  Gross per 24 hour  Intake    240 ml  Output      0 ml  Net    240 ml    Exam:   General:  Pt is alert, follows commands  appropriately, not in acute distress  Cardiovascular: Regular rate and rhythm, no rubs, no gallops  Respiratory: Clear to auscultation bilaterally, no wheezing, no crackles, no rhonchi  Abdomen: Soft, tender in lower abd quadrants, non distended, bowel sounds faint, no guarding   Data Reviewed: Basic Metabolic Panel:  Recent Labs Lab 06/25/2015 1400 06/20/15 0322 06/20/15 1645 06/21/15 0556 06/22/15 0755 06/23/15 0625  NA 142 140  --  139 139 136  K 4.3 5.9* 4.8 3.9 3.5 5.0  CL 91* 86*  --  93* 98* 97*  CO2 25 31  --  28 26 27   GLUCOSE 61* 44*  --  90 84 89  BUN 28* 40*  --  19 25* 21*  CREATININE 6.28* 6.86*  --  3.71* 3.56* 4.02*  CALCIUM 9.2 8.2*  --  8.8* 8.4* 8.3*  PHOS  --   --   --   --  2.5 2.3*   Liver Function Tests:  Recent Labs Lab 07/02/2015 1400 06/22/15 0755 06/23/15 0625  AST 47*  --   --   ALT 24  --   --   ALKPHOS 105  --   --   BILITOT 1.1  --   --   PROT 7.2  --   --   ALBUMIN 2.9* 2.1* 2.1*    Recent Labs Lab 06/10/2015 1400  LIPASE 18   CBC:  Recent Labs Lab 06/25/2015 1400 06/20/15 0322 06/21/15 0556 06/22/15 0755 06/23/15 0625  WBC 6.5 8.7 11.4* 7.0 7.2  NEUTROABS 5.6  --   --   --   --   HGB 10.6* 10.1* 9.3* 7.8* 9.4*  HCT 32.0* 29.5* 26.9* 23.4* 27.6*  MCV 83.1 82.6 81.8 82.1 81.9  PLT 141* 142* 137* 108* 101*   CBG:  Recent Labs Lab 06/11/2015 1744 06/20/15 0438 06/20/15 0537 06/20/15 2001  GLUCAP 90 40* 100* 92    Recent Results (from the past 240 hour(s))  C difficile quick scan w PCR reflex     Status: None   Collection Time: 06/20/15  8:40 AM  Result Value Ref Range Status   C Diff antigen NEGATIVE NEGATIVE Final   C Diff toxin NEGATIVE NEGATIVE Final   C Diff interpretation Negative for toxigenic C. difficile  Final  MRSA PCR Screening     Status: None   Collection Time: 06/21/15 11:51 AM  Result Value Ref Range Status   MRSA by PCR NEGATIVE NEGATIVE Final    Comment:        The GeneXpert MRSA Assay  (FDA approved for NASAL specimens only), is one component of a comprehensive MRSA colonization surveillance program. It is not intended to diagnose MRSA infection nor to guide or monitor treatment for MRSA infections.      Scheduled Meds: . amLODipine  10 mg Oral QHS  . atorvastatin  40 mg Oral Daily  . cinacalcet  60 mg Oral Q breakfast  . ciprofloxacin  400 mg Intravenous Q24H  . darbepoetin (ARANESP) injection - DIALYSIS  60 mcg Intravenous Q Fri-HD  . feeding supplement (NEPRO CARB STEADY)  237 mL Oral BID BM  . heparin  5,000 Units Subcutaneous 3 times per day  . lanthanum  1,000 mg Oral BID WC  . metoprolol  100 mg Oral QHS  . metronidazole  500 mg Intravenous Q8H  . mirtazapine  7.5 mg Oral Daily  . multivitamin  1 tablet Oral QHS  . senna-docusate  1 tablet Oral BID   Continuous Infusions:

## 2015-06-24 ENCOUNTER — Inpatient Hospital Stay (HOSPITAL_COMMUNITY): Payer: Medicare Other

## 2015-06-24 LAB — CBC
HCT: 27.9 % — ABNORMAL LOW (ref 39.0–52.0)
HEMOGLOBIN: 9.1 g/dL — AB (ref 13.0–17.0)
MCH: 26.9 pg (ref 26.0–34.0)
MCHC: 32.6 g/dL (ref 30.0–36.0)
MCV: 82.5 fL (ref 78.0–100.0)
Platelets: 95 10*3/uL — ABNORMAL LOW (ref 150–400)
RBC: 3.38 MIL/uL — AB (ref 4.22–5.81)
RDW: 15.3 % (ref 11.5–15.5)
WBC: 7.2 10*3/uL (ref 4.0–10.5)

## 2015-06-24 LAB — BASIC METABOLIC PANEL
ANION GAP: 15 (ref 5–15)
BUN: 36 mg/dL — AB (ref 6–20)
CHLORIDE: 99 mmol/L — AB (ref 101–111)
CO2: 24 mmol/L (ref 22–32)
Calcium: 8.5 mg/dL — ABNORMAL LOW (ref 8.9–10.3)
Creatinine, Ser: 5.84 mg/dL — ABNORMAL HIGH (ref 0.61–1.24)
GFR calc Af Amer: 10 mL/min — ABNORMAL LOW (ref >60)
GFR calc non Af Amer: 8 mL/min — ABNORMAL LOW (ref >60)
Glucose, Bld: 76 mg/dL (ref 65–99)
POTASSIUM: 4.4 mmol/L (ref 3.5–5.1)
SODIUM: 138 mmol/L (ref 135–145)

## 2015-06-24 MED ORDER — METOPROLOL SUCCINATE ER 50 MG PO TB24: 50.0000 mg | ORAL_TABLET | Freq: Every day | ORAL | Status: DC

## 2015-06-24 MED ORDER — DIATRIZOATE MEGLUMINE & SODIUM 66-10 % PO SOLN
90.0000 mL | Freq: Once | ORAL | Status: AC
  Administered 2015-06-24: 90 mL via NASOGASTRIC

## 2015-06-24 MED ORDER — DIATRIZOATE MEGLUMINE & SODIUM 66-10 % PO SOLN
ORAL | Status: AC
  Administered 2015-06-24: 90 mL via NASOGASTRIC
  Filled 2015-06-24: qty 90

## 2015-06-24 MED ORDER — CETYLPYRIDINIUM CHLORIDE 0.05 % MT LIQD
7.0000 mL | Freq: Two times a day (BID) | OROMUCOSAL | Status: DC
  Administered 2015-06-25 – 2015-07-02 (×12): 7 mL via OROMUCOSAL

## 2015-06-24 MED ORDER — HYDROMORPHONE HCL 1 MG/ML IJ SOLN: 0.5000 mg | INTRAMUSCULAR | Status: DC | PRN

## 2015-06-24 MED ORDER — IOHEXOL 300 MG/ML  SOLN
80.0000 mL | Freq: Once | INTRAMUSCULAR | Status: AC | PRN
  Administered 2015-06-24: 80 mL via INTRAVENOUS

## 2015-06-24 MED ORDER — AMLODIPINE BESYLATE 5 MG PO TABS: 5.0000 mg | ORAL_TABLET | Freq: Every day | ORAL | Status: DC

## 2015-06-24 MED ORDER — DEXTROSE-NACL 5-0.9 % IV SOLN
INTRAVENOUS | Status: DC
  Administered 2015-06-24 – 2015-07-02 (×7): via INTRAVENOUS

## 2015-06-24 MED ORDER — CHLORHEXIDINE GLUCONATE 0.12 % MT SOLN
15.0000 mL | Freq: Two times a day (BID) | OROMUCOSAL | Status: DC
  Administered 2015-06-24 – 2015-07-02 (×13): 15 mL via OROMUCOSAL
  Filled 2015-06-24 (×13): qty 15

## 2015-06-24 MED ORDER — METOPROLOL TARTRATE 1 MG/ML IV SOLN: 5.0000 mg | Freq: Four times a day (QID) | INTRAVENOUS | Status: DC | PRN

## 2015-06-24 NOTE — Progress Notes (Signed)
Radiologist called with CT scan showing Acute Appendicitis with Perforation.  Provided radiologist with pager number of GI MD, who ordered CT scan.  Notified surgery Rosendo Gros).  Surgery will come back and see patient.  Jillyn Ledger, MBA, BS, RN

## 2015-06-24 NOTE — Progress Notes (Signed)
Assessment/Plan: 1. Abdominal Pain/Enteritis/or other- now with NGT and copious drainage: per primary. On ATB. CT pending. Will begin IVFs  2. ESRD - MWF at [facility] Next tx Monday 3. Hypertension/volume -. BP falling and he looks very dry despite the weights (EDW 48).  Reduce BP meds .  Subjective: Interval History: Had NG tube placed with lg drainage.  CT abdomen done.  Objective: Vital signs in last 24 hours: Temp:  [98.3 F (36.8 C)-99 F (37.2 C)] 98.3 F (36.8 C) (03/19 0855) Pulse Rate:  [78-116] 116 (03/19 0855) Resp:  [16-20] 17 (03/19 0855) BP: (97-135)/(47-97) 97/47 mmHg (03/19 0855) SpO2:  [100 %] 100 % (03/19 0855) Weight:  [50.2 kg (110 lb 10.7 oz)] 50.2 kg (110 lb 10.7 oz) (03/18 2007) Weight change: 1.9 kg (4 lb 3 oz)  Intake/Output from previous day: 03/18 0701 - 03/19 0700 In: 1000 [IV Piggyback:1000] Out: 1400 [Emesis/NG output:1400] Intake/Output this shift:    General appearance: alert and cooperative Back: negative, symmetric, no curvature. ROM normal. No CVA tenderness. Resp: clear to auscultation bilaterally Chest wall: no tenderness Cardio: regular rate and rhythm, S1, S2 normal, no murmur, click, rub or gallop GI: soft, non-tender; bowel sounds normal; no masses,  no organomegaly and scaphoid  Lab Results:  Recent Labs  06/23/15 0625 06/24/15 0524  WBC 7.2 7.2  HGB 9.4* 9.1*  HCT 27.6* 27.9*  PLT 101* 95*   BMET:  Recent Labs  06/23/15 0625 06/24/15 0524  NA 136 138  K 5.0 4.4  CL 97* 99*  CO2 27 24  GLUCOSE 89 76  BUN 21* 36*  CREATININE 4.02* 5.84*  CALCIUM 8.3* 8.5*   No results for input(s): PTH in the last 72 hours. Iron Studies: No results for input(s): IRON, TIBC, TRANSFERRIN, FERRITIN in the last 72 hours. Studies/Results: Dg Abd 1 View  06/22/2015  CLINICAL DATA:  Lower abdominal pain and small bowel obstruction. EXAM: ABDOMEN - 1 VIEW COMPARISON:  06/19/2005 FINDINGS: Degree of small bowel dilatation appears improved  with some residual dilatation remaining. There is some transit of diluted oral contrast into the colon. IMPRESSION: Improving small bowel obstruction. Electronically Signed   By: Aletta Edouard M.D.   On: 06/22/2015 14:57   Dg Abd Portable 1v  06/24/2015  CLINICAL DATA:  Nasogastric tube placement.  Initial encounter. EXAM: PORTABLE ABDOMEN - 1 VIEW COMPARISON:  Abdominal radiograph performed earlier today at 4:32 p.m. FINDINGS: The patient's enteric tube is noted ending overlying the body of the stomach. The side port is also noted at the body of the stomach. There is diffuse distention of small-bowel loops up to 7.0 cm, concerning for worsening high-grade small bowel obstruction. No free intra-abdominal air is seen, though evaluation for free air is limited on a single supine view. No acute osseous abnormalities are identified. IMPRESSION: 1. Enteric tube noted ending overlying the body of the stomach. 2. Diffuse distention of small bowel loops up to 7.0 cm, concerning for worsening high-grade small bowel obstruction. No free intra-abdominal air seen. Electronically Signed   By: Garald Balding M.D.   On: 06/24/2015 00:47   Dg Abd Portable 1v  06/23/2015  CLINICAL DATA:  history of ESRD on HD (MWF), HTN, Gout who presented to the ED with ABD pain and diarrhea x 2 days. Evaluate SBO EXAM: PORTABLE ABDOMEN - 1 VIEW COMPARISON:  06/22/2015 FINDINGS: Persistent dilatation of central small bowel loops. Persistent particular debris within distended stomach. No evidence first free intraperitoneal air on this supine view  of the abdomen. IMPRESSION: Persistent small bowel obstruction. Electronically Signed   By: Nolon Nations M.D.   On: 06/23/2015 16:52    Scheduled: . amLODipine  10 mg Oral QHS  . atorvastatin  40 mg Oral Daily  . cinacalcet  60 mg Oral Q breakfast  . ciprofloxacin  400 mg Intravenous Q24H  . darbepoetin (ARANESP) injection - DIALYSIS  60 mcg Intravenous Q Fri-HD  . feeding supplement  (NEPRO CARB STEADY)  237 mL Oral BID BM  . heparin  5,000 Units Subcutaneous 3 times per day  . lanthanum  1,000 mg Oral BID WC  . metoprolol  100 mg Oral QHS  . metronidazole  500 mg Intravenous Q8H  . mirtazapine  7.5 mg Oral Daily  . multivitamin  1 tablet Oral QHS  . senna-docusate  1 tablet Oral BID     LOS: 5 days   Maysa Lynn C 06/24/2015,11:22 AM

## 2015-06-24 NOTE — Progress Notes (Addendum)
Patient ID: Brad [name], male   DOB: [birth year], 80 y.o.   MRN: [id number]  [facility] PROGRESS NOTE  Brad [name] [id number] DOB: 10-23-1935 DOA: 06/07/2015 PCP: Willene Hatchet, NP   Brief narrative:    80 y.o. male with a history of ESRD on HD (MWF), HTN, Gout who presented to the ED with ABD pain and diarrhea x 2 days.   In ED and A CT scan of the ABD revealed ? Enteritis, pt placed on IV Cipro and Flagyl and TRH asked to admit for further evaluation.   Major events since admission: 3/18 - vomiting, NGT placed 3/19 - worsening SBO on abd imaging, surgery consulted   Assessment/Plan:    Principal Problem:   Enteritis with RLQ abd pain / SBO  - no significant improvement despite NGT placement and less abd distension  - continue flagyl and Cipro  - will discuss with surgery team as imaging studies with worsening SBO - keep NPO for now and provide analgesia as needed   Active Problems:   Hyperkalemia: - resolved  - BMP in AM    ESRD  - MWF at [facility] - appreciate nephrology team following   Hypertension, essential  - soft this AM, stop Norvasc - monitor - may need to stop metoprolol but pt still tachycardic so Ok to keep on list for now  Anemia of chronic disease/Thrombocytopenia - Hg improved since yesterday  - no sings of bleeding  - CBC In AM    Leukocytosis - CT chest with no signs of PNA, likely due to SBO - resolved  - CBC in AM    Underweight, Severe PCM - Body mass index is 15.85 kg/(m^2) - nutritionist consulted, appreciate recommendations   DVT prophylaxis - Heparin SQ  Code Status: Full.  Family Communication:  plan of care discussed with the patient Disposition Plan: Home by 3/22  IV access:  Peripheral IV  Procedures and diagnostic studies:    Ct Abdomen Pelvis Wo Contrast 06/12/2015  Moderately distended small bowel throughout the abdomen and pelvis, fluid-filled throughout, most prominent distension is of the upper  abdominal small bowel and stomach. 2. Thickening of the walls of the distal small bowel (terminal ileum) is presumed to be partially obstructive given the dilatation of the more proximal small bowel and the relative decompression of the large bowel. This bowel wall thickening likely represents an enteritis of infectious, inflammatory or ischemic etiology, with associated inflammation and fluid stranding in the adjacent right lower quadrant mesentery. Recommend follow-up plain film to ensure the eventual passage of the oral contrast from the small bowel due to the large bowel thereby excluding a complete obstruction. 3. Additional chronic/incidental findings detailed above.  Dg Abd 1 View 06/22/2015   Improving small bowel obstruction.  Ct Chest Wo Contrast 06/21/2015  Mild emphysematous changes. 2. Bibasilar atelectasis. 3. Ectatic ascending aorta. Recommend annual imaging followup by CTA or MRA.   Dg Abd 1 View 06/22/2015  Improving small bowel obstruction.   Dg Abd Portable 1v 06/24/2015  Enteric tube noted ending overlying the body of the stomach. 2. Diffuse distention of small bowel loops up to 7.0 cm, concerning for worsening high-grade small bowel obstruction. No free intra-abdominal air seen.  Dg Abd Portable 1v 06/23/2015  Persistent small bowel obstruction.   Medical Consultants:  Nephrology  GI Surgery   Other Consultants:  None  IAnti-Infectives:   Cipro 3/14 --> Flagyl 3/14 -->  Faye Ramsay, MD  [facility] Pager [phone]  If 7PM-7AM,  please contact night-coverage [email or web address] Password [facility] 06/24/2015, 11:38 AM   LOS: 5 days   HPI/Subjective: No events overnight. Vomiting 3/18, NGT placed 3/18, still with nausea.  Objective: Filed Vitals:   06/23/15 1810 06/23/15 2007 06/24/15 0540 06/24/15 0855  BP: 121/60 126/62 135/97 97/47  Pulse: 82 80 78 116  Temp: 99 F (37.2 C) 98.9 F (37.2 C) 98.9 F (37.2 C) 98.3 F (36.8 C)  TempSrc: Oral  Oral Oral  Resp: 17  20 18 17   Height:      Weight:  50.2 kg (110 lb 10.7 oz)    SpO2: 100% 100%  100%    Intake/Output Summary (Last 24 hours) at 06/24/15 1138 Last data filed at 06/24/15 1019  Gross per 24 hour  Intake   1000 ml  Output   1400 ml  Net   -400 ml    Exam:   General:  Pt is alert, follows commands appropriately, not in acute distress  Cardiovascular: Regular rhythm, tachycardic, no rubs, no gallops  Respiratory: Clear to auscultation bilaterally, no wheezing, diminished breath sounds at bases   Abdomen: Soft, tender in lower abd quadrants, non distended, bowel sounds faint, no guarding   Data Reviewed: Basic Metabolic Panel:  Recent Labs Lab 06/20/15 0322 06/20/15 1645 06/21/15 0556 06/22/15 0755 06/23/15 0625 06/24/15 0524  NA 140  --  139 139 136 138  K 5.9* 4.8 3.9 3.5 5.0 4.4  CL 86*  --  93* 98* 97* 99*  CO2 31  --  28 26 27 24   GLUCOSE 44*  --  90 84 89 76  BUN 40*  --  19 25* 21* 36*  CREATININE 6.86*  --  3.71* 3.56* 4.02* 5.84*  CALCIUM 8.2*  --  8.8* 8.4* 8.3* 8.5*  PHOS  --   --   --  2.5 2.3*  --    Liver Function Tests:  Recent Labs Lab 06/15/2015 1400 06/22/15 0755 06/23/15 0625  AST 47*  --   --   ALT 24  --   --   ALKPHOS 105  --   --   BILITOT 1.1  --   --   PROT 7.2  --   --   ALBUMIN 2.9* 2.1* 2.1*    Recent Labs Lab 06/06/2015 1400  LIPASE 18   CBC:  Recent Labs Lab 06/11/2015 1400 06/20/15 0322 06/21/15 0556 06/22/15 0755 06/23/15 0625 06/24/15 0524  WBC 6.5 8.7 11.4* 7.0 7.2 7.2  NEUTROABS 5.6  --   --   --   --   --   HGB 10.6* 10.1* 9.3* 7.8* 9.4* 9.1*  HCT 32.0* 29.5* 26.9* 23.4* 27.6* 27.9*  MCV 83.1 82.6 81.8 82.1 81.9 82.5  PLT 141* 142* 137* 108* 101* 95*   CBG:  Recent Labs Lab 06/13/2015 1744 06/20/15 0438 06/20/15 0537 06/20/15 2001  GLUCAP 90 40* 100* 92    Recent Results (from the past 240 hour(s))  C difficile quick scan w PCR reflex     Status: None   Collection Time: 06/20/15  8:40 AM  Result  Value Ref Range Status   C Diff antigen NEGATIVE NEGATIVE Final   C Diff toxin NEGATIVE NEGATIVE Final   C Diff interpretation Negative for toxigenic C. difficile  Final  MRSA PCR Screening     Status: None   Collection Time: 06/21/15 11:51 AM  Result Value Ref Range Status   MRSA by PCR NEGATIVE NEGATIVE Final    Comment:  The GeneXpert MRSA Assay (FDA approved for NASAL specimens only), is one component of a comprehensive MRSA colonization surveillance program. It is not intended to diagnose MRSA infection nor to guide or monitor treatment for MRSA infections.      Scheduled Meds: . amLODipine  5 mg Oral QHS  . atorvastatin  40 mg Oral Daily  . cinacalcet  60 mg Oral Q breakfast  . ciprofloxacin  400 mg Intravenous Q24H  . darbepoetin (ARANESP) injection - DIALYSIS  60 mcg Intravenous Q Fri-HD  . diatrizoate meglumine-sodium  90 mL Per NG tube Once  . feeding supplement (NEPRO CARB STEADY)  237 mL Oral BID BM  . heparin  5,000 Units Subcutaneous 3 times per day  . lanthanum  1,000 mg Oral BID WC  . metoprolol  50 mg Oral QHS  . metronidazole  500 mg Intravenous Q8H  . mirtazapine  7.5 mg Oral Daily  . multivitamin  1 tablet Oral QHS  . senna-docusate  1 tablet Oral BID   Continuous Infusions: . dextrose 5 % and 0.9% NaCl

## 2015-06-24 NOTE — Consult Note (Signed)
Reason for Consult:SBO Referring Physician: Dr. Laurine Blazer Brad [name] is an 80 y.o. male.  HPI: Patient is a 80 year old male who was admitted secondary to day history of abdominal pain and diarrhea. GI was consult for further evaluation was thought to have possible IBD versus infectious enteritis. Patient continued with serial KUBs which revealed should continue dilated bowels. Patient had an NG tube placed. Surgery was consult to help for further evaluation and treatment.  Patient does have a history of a recent colonoscopy which revealed no signs of inflammatory bowel disease.  Past Medical History  Diagnosis Date  . Hypertension   . High cholesterol   . Gout   . Anemia   . Renal disorder     End stage renal disease secondary to HTN nephrosclerosis  . Secondary hyperparathyroidism ([place])   . Hypocalcemia   . Colon polyps   . Paroxysmal supraventricular tachycardia ([place])   . Status post dilatation of esophageal stricture   . Syncope   . Rash and nonspecific skin eruption 01/09/2015  . Loss of weight 01/09/2015  . Poor appetite 01/09/2015    Past Surgical History  Procedure Laterality Date  . Right arm amputation     . Revision of arteriovenous goretex graft Left 12/15/2012    Procedure: REVISION OF ARTERIOVENOUS GORETEX GRAFT using 17m x 20cm Gortex graft;  Surgeon: JMal Misty MD;  [facility]  Service: [facility]ascular;  Laterality: Left;  . Esophagogastroduodenoscopy N/A 01/27/2014    Procedure: ESOPHAGOGASTRODUODENOSCOPY (EGD);  Surgeon: PBeryle Beams MD;  [facility];  Service: Endoscopy;  Laterality: N/A;  . Esophageal dilation N/A 01/27/2014    Procedure: ESOPHAGEAL DILATION;  Surgeon: PBeryle Beams MD;  [facility];  Service: Endoscopy;  Laterality: N/A;  Balloon Dilation  . Esophagogastroduodenoscopy N/A 08/15/2014    Procedure: ESOPHAGOGASTRODUODENOSCOPY (EGD);  Surgeon: PCarol Ada MD;  [facility]  Service: Endoscopy;   Laterality: N/A;  . Colonoscopy N/A 08/17/2014    Procedure: COLONOSCOPY;  Surgeon: PCarol Ada MD;  [facility];  Service: Endoscopy;  Laterality: N/A;    No family history on file.  Social History:  reports that he has never smoked. He does not have any smokeless tobacco history on file. He reports that he does not drink alcohol or use illicit drugs.  Allergies: No Known Allergies  Medications: I have reviewed the patient's current medications.  Results for orders placed or performed during the hospital encounter of 06/11/2015 (from the past 48 hour(s))  Renal function panel     Status: Abnormal   Collection Time: 06/23/15  6:25 AM  Result Value Ref Range   Sodium 136 135 - 145 mmol/L   Potassium 5.0 3.5 - 5.1 mmol/L    Comment: NO VISIBLE HEMOLYSIS DELTA CHECK NOTED    Chloride 97 (L) 101 - 111 mmol/L   CO2 27 22 - 32 mmol/L   Glucose, Bld 89 65 - 99 mg/dL   BUN 21 (H) 6 - 20 mg/dL   Creatinine, Ser 4.02 (H) 0.61 - 1.24 mg/dL   Calcium 8.3 (L) 8.9 - 10.3 mg/dL   Phosphorus 2.3 (L) 2.5 - 4.6 mg/dL   Albumin 2.1 (L) 3.5 - 5.0 g/dL   GFR calc non Af Amer 13 (L) >60 mL/min   GFR calc Af Amer 15 (L) >60 mL/min    Comment: (NOTE) The eGFR has been calculated using the CKD EPI equation. This calculation has not been validated in all clinical situations. eGFR's persistently <60  mL/min signify possible Chronic Kidney Disease.    Anion gap 12 5 - 15  CBC     Status: Abnormal   Collection Time: 06/23/15  6:25 AM  Result Value Ref Range   WBC 7.2 4.0 - 10.5 K/uL   RBC 3.37 (L) 4.22 - 5.81 MIL/uL   Hemoglobin 9.4 (L) 13.0 - 17.0 g/dL   HCT 27.6 (L) 39.0 - 52.0 %   MCV 81.9 78.0 - 100.0 fL   MCH 27.9 26.0 - 34.0 pg   MCHC 34.1 30.0 - 36.0 g/dL   RDW 15.7 (H) 11.5 - 15.5 %   Platelets 101 (L) 150 - 400 K/uL    Comment: CONSISTENT WITH PREVIOUS RESULT  CBC     Status: Abnormal   Collection Time: 06/24/15  5:24 AM  Result Value Ref Range   WBC 7.2 4.0 - 10.5 K/uL    RBC 3.38 (L) 4.22 - 5.81 MIL/uL   Hemoglobin 9.1 (L) 13.0 - 17.0 g/dL   HCT 27.9 (L) 39.0 - 52.0 %   MCV 82.5 78.0 - 100.0 fL   MCH 26.9 26.0 - 34.0 pg   MCHC 32.6 30.0 - 36.0 g/dL   RDW 15.3 11.5 - 15.5 %   Platelets 95 (L) 150 - 400 K/uL    Comment: CONSISTENT WITH PREVIOUS RESULT  Basic metabolic panel     Status: Abnormal   Collection Time: 06/24/15  5:24 AM  Result Value Ref Range   Sodium 138 135 - 145 mmol/L   Potassium 4.4 3.5 - 5.1 mmol/L   Chloride 99 (L) 101 - 111 mmol/L   CO2 24 22 - 32 mmol/L   Glucose, Bld 76 65 - 99 mg/dL   BUN 36 (H) 6 - 20 mg/dL   Creatinine, Ser 5.84 (H) 0.61 - 1.24 mg/dL   Calcium 8.5 (L) 8.9 - 10.3 mg/dL   GFR calc non Af Amer 8 (L) >60 mL/min   GFR calc Af Amer 10 (L) >60 mL/min    Comment: (NOTE) The eGFR has been calculated using the CKD EPI equation. This calculation has not been validated in all clinical situations. eGFR's persistently <60 mL/min signify possible Chronic Kidney Disease.    Anion gap 15 5 - 15    Dg Abd 1 View  06/22/2015  CLINICAL DATA:  Lower abdominal pain and small bowel obstruction. EXAM: ABDOMEN - 1 VIEW COMPARISON:  06/19/2005 FINDINGS: Degree of small bowel dilatation appears improved with some residual dilatation remaining. There is some transit of diluted oral contrast into the colon. IMPRESSION: Improving small bowel obstruction. Electronically Signed   By: Aletta Edouard M.D.   On: 06/22/2015 14:57   Dg Abd Portable 1v  06/24/2015  CLINICAL DATA:  Nasogastric tube placement.  Initial encounter. EXAM: PORTABLE ABDOMEN - 1 VIEW COMPARISON:  Abdominal radiograph performed earlier today at 4:32 p.m. FINDINGS: The patient's enteric tube is noted ending overlying the body of the stomach. The side port is also noted at the body of the stomach. There is diffuse distention of small-bowel loops up to 7.0 cm, concerning for worsening high-grade small bowel obstruction. No free intra-abdominal air is seen, though evaluation  for free air is limited on a single supine view. No acute osseous abnormalities are identified. IMPRESSION: 1. Enteric tube noted ending overlying the body of the stomach. 2. Diffuse distention of small bowel loops up to 7.0 cm, concerning for worsening high-grade small bowel obstruction. No free intra-abdominal air seen. Electronically Signed   By: Jacqulynn Cadet  Chang M.D.   On: 06/24/2015 00:47   Dg Abd Portable 1v  06/23/2015  CLINICAL DATA:  history of ESRD on HD (MWF), HTN, Gout who presented to the ED with ABD pain and diarrhea x 2 days. Evaluate SBO EXAM: PORTABLE ABDOMEN - 1 VIEW COMPARISON:  06/22/2015 FINDINGS: Persistent dilatation of central small bowel loops. Persistent particular debris within distended stomach. No evidence first free intraperitoneal air on this supine view of the abdomen. IMPRESSION: Persistent small bowel obstruction. Electronically Signed   By: Nolon Nations M.D.   On: 06/23/2015 16:52    Review of Systems  Constitutional: Negative.   HENT: Negative.   Respiratory: Negative.   Cardiovascular: Negative.   Gastrointestinal: Positive for abdominal pain and diarrhea.  Musculoskeletal: Negative.   Skin: Negative.   Neurological: Negative.    Blood pressure 97/47, pulse 116, temperature 98.3 F (36.8 C), temperature source Oral, resp. rate 17, height 5' 9" (1.753 m), weight 50.2 kg (110 lb 10.7 oz), SpO2 100 %. Physical Exam  Constitutional: He is oriented to person, place, and time. He appears well-developed. He appears cachectic.  HENT:  Head: Normocephalic and atraumatic.  Eyes: Conjunctivae and EOM are normal. Pupils are equal, round, and reactive to light.  Neck: Normal range of motion. Neck supple.  Cardiovascular: Normal rate, regular rhythm and normal heart sounds.   Respiratory: Effort normal and breath sounds normal.  GI: Soft. He exhibits no distension. There is tenderness (to deep palp). There is no rebound and no guarding.  Musculoskeletal: Normal  range of motion.  Neurological: He is alert and oriented to person, place, and time.  Skin: Skin is warm and dry.    Assessment/Plan: 80 year old male with likely partial small bowel obstruction  Principal Problem:   Enteritis Active Problems:   Gout, unspecified   HYPERTENSION, BENIGN SYSTEMIC   ESRD on dialysis ([place])   Protein-calorie malnutrition, severe ([place])   ESRD on HD  1. We'll institute small bowel protocol 2. We'll continue with serial abdominal exams 3. Recommend continue with NG tube placement.   Rosario Jacks., Andrei Mccook 06/24/2015, 11:21 AM     Addendum: CT scan of abdomen and pelvis with contrast results noted. Ruptured appendicitis with phlegmon.  At this point I would recommend continue with IV antibiotics. Patient will likely require an appendectomy. Patient likely with ileus secondary to infection. Will con't to watch

## 2015-06-24 NOTE — Progress Notes (Addendum)
[facility] Progress Note Covering for Drs. Adriana Mccallum this weekend    Since last GI note: KUB yesterday revealed persistent SB dilation, NG tube was replaced and repeat KUB after that showed interval INcrease in SB dilation.  Says he feels overall poor today but no real abd pains.  Wants some ice.  Objective: Vital signs in last 24 hours: Temp:  [97 F (36.1 C)-99 F (37.2 C)] 98.9 F (37.2 C) (03/19 0540) Pulse Rate:  [78-82] 78 (03/19 0540) Resp:  [16-20] 18 (03/19 0540) BP: (118-141)/(60-97) 135/97 mmHg (03/19 0540) SpO2:  [100 %] 100 % (03/18 2007) Weight:  [110 lb 10.7 oz (50.2 kg)] 110 lb 10.7 oz (50.2 kg) (03/18 2007) Last BM Date: 06/22/15 General: alert and oriented times 3 Heart: regular rate and rythm Abdomen: soft, non-tender, non-distended, hypoactive bowel sounds NG tube in place  Lab Results:  Recent Labs  06/22/15 0755 06/23/15 0625 06/24/15 0524  WBC 7.0 7.2 7.2  HGB 7.8* 9.4* 9.1*  PLT 108* 101* 95*  MCV 82.1 81.9 82.5    Recent Labs  06/22/15 0755 06/23/15 0625 06/24/15 0524  NA 139 136 138  K 3.5 5.0 4.4  CL 98* 97* 99*  CO2 26 27 24   GLUCOSE 84 89 76  BUN 25* 21* 36*  CREATININE 3.56* 4.02* 5.84*  CALCIUM 8.4* 8.3* 8.5*    Recent Labs  06/22/15 0755 06/23/15 0625  ALBUMIN 2.1* 2.1*   No results for input(s): INR in the last 72 hours.   Studies/Results: Dg Abd 1 View  06/22/2015  CLINICAL DATA:  Lower abdominal pain and small bowel obstruction. EXAM: ABDOMEN - 1 VIEW COMPARISON:  06/19/2005 FINDINGS: Degree of small bowel dilatation appears improved with some residual dilatation remaining. There is some transit of diluted oral contrast into the colon. IMPRESSION: Improving small bowel obstruction. Electronically Signed   By: Aletta Edouard M.D.   On: 06/22/2015 14:57   Dg Abd Portable 1v  06/24/2015  CLINICAL DATA:  Nasogastric tube placement.  Initial encounter. EXAM: PORTABLE ABDOMEN - 1 VIEW COMPARISON:   Abdominal radiograph performed earlier today at 4:32 p.m. FINDINGS: The patient's enteric tube is noted ending overlying the body of the stomach. The side port is also noted at the body of the stomach. There is diffuse distention of small-bowel loops up to 7.0 cm, concerning for worsening high-grade small bowel obstruction. No free intra-abdominal air is seen, though evaluation for free air is limited on a single supine view. No acute osseous abnormalities are identified. IMPRESSION: 1. Enteric tube noted ending overlying the body of the stomach. 2. Diffuse distention of small bowel loops up to 7.0 cm, concerning for worsening high-grade small bowel obstruction. No free intra-abdominal air seen. Electronically Signed   By: Garald Balding M.D.   On: 06/24/2015 00:47   Dg Abd Portable 1v  06/23/2015  CLINICAL DATA:  history of ESRD on HD (MWF), HTN, Gout who presented to the ED with ABD pain and diarrhea x 2 days. Evaluate SBO EXAM: PORTABLE ABDOMEN - 1 VIEW COMPARISON:  06/22/2015 FINDINGS: Persistent dilatation of central small bowel loops. Persistent particular debris within distended stomach. No evidence first free intraperitoneal air on this supine view of the abdomen. IMPRESSION: Persistent small bowel obstruction. Electronically Signed   By: Nolon Nations M.D.   On: 06/23/2015 16:52     Medications: Scheduled Meds: . amLODipine  10 mg Oral QHS  . atorvastatin  40 mg Oral Daily  . cinacalcet  60 mg Oral  Q breakfast  . ciprofloxacin  400 mg Intravenous Q24H  . darbepoetin (ARANESP) injection - DIALYSIS  60 mcg Intravenous Q Fri-HD  . feeding supplement (NEPRO CARB STEADY)  237 mL Oral BID BM  . heparin  5,000 Units Subcutaneous 3 times per day  . lanthanum  1,000 mg Oral BID WC  . metoprolol  100 mg Oral QHS  . metronidazole  500 mg Intravenous Q8H  . mirtazapine  7.5 mg Oral Daily  . multivitamin  1 tablet Oral QHS  . senna-docusate  1 tablet Oral BID   Continuous Infusions:  PRN  Meds:.acetaminophen **OR** acetaminophen, dextrose, HYDROmorphone (DILAUDID) injection, ondansetron **OR** ondansetron (ZOFRAN) IV, oxyCODONE    Assessment/Plan: 80 y.o. male with persistent SBO  This has been presumed to be infectious (over IBD, see Dr. Benson Norway consult note on the 14th) and has clinically waxed and waned over the past 4 days however clearly on imaging his is still obstructed and most recently vomiting, abd pain has increased.  NG tube was replaced last night.  This morning his abd is soft, not distended and not tender.  The NG tube may have decompressed him.  Unclear etiology here, IBD vs. Infectious vs. Ischemic (would be unusual site for this) vs. Neoplasm (?).  I think it is ok that he have ice chips and will order.  Will also get repeat CT with IV contrast this time given the clinical uncertainty here (he is on HD, due for it tomorrow).  I believe surgery was contact by Dr. Doyle Askew yesterday (per progress note), I think their input would be helpful here. For now, continue IV Abx, NG tube.   Milus Banister, MD  06/24/2015, 7:20 AM [facility] Pager [phone]

## 2015-06-25 LAB — RENAL FUNCTION PANEL
Albumin: 1.8 g/dL — ABNORMAL LOW (ref 3.5–5.0)
Anion gap: 13 (ref 5–15)
BUN: 46 mg/dL — AB (ref 6–20)
CHLORIDE: 101 mmol/L (ref 101–111)
CO2: 26 mmol/L (ref 22–32)
CREATININE: 7.25 mg/dL — AB (ref 0.61–1.24)
Calcium: 8.2 mg/dL — ABNORMAL LOW (ref 8.9–10.3)
GFR calc Af Amer: 7 mL/min — ABNORMAL LOW (ref >60)
GFR, EST NON AFRICAN AMERICAN: 6 mL/min — AB (ref >60)
GLUCOSE: 130 mg/dL — AB (ref 65–99)
POTASSIUM: 4.3 mmol/L (ref 3.5–5.1)
Phosphorus: 4 mg/dL (ref 2.5–4.6)
Sodium: 140 mmol/L (ref 135–145)

## 2015-06-25 LAB — CBC
HEMATOCRIT: 25.2 % — AB (ref 39.0–52.0)
Hemoglobin: 8.7 g/dL — ABNORMAL LOW (ref 13.0–17.0)
MCH: 28.3 pg (ref 26.0–34.0)
MCHC: 34.5 g/dL (ref 30.0–36.0)
MCV: 82.1 fL (ref 78.0–100.0)
Platelets: 119 10*3/uL — ABNORMAL LOW (ref 150–400)
RBC: 3.07 MIL/uL — ABNORMAL LOW (ref 4.22–5.81)
RDW: 15.5 % (ref 11.5–15.5)
WBC: 8.8 10*3/uL (ref 4.0–10.5)

## 2015-06-25 MED ORDER — SODIUM CHLORIDE 0.9 % IV SOLN: 100.0000 mL | INTRAVENOUS | Status: DC | PRN

## 2015-06-25 MED ORDER — BISACODYL 10 MG RE SUPP
10.0000 mg | Freq: Once | RECTAL | Status: AC
  Administered 2015-06-25: 10 mg via RECTAL
  Filled 2015-06-25: qty 1

## 2015-06-25 MED ORDER — HEPARIN SODIUM (PORCINE) 1000 UNIT/ML DIALYSIS: 1000.0000 [IU] | INTRAMUSCULAR | Status: DC | PRN

## 2015-06-25 MED ORDER — LIDOCAINE HCL (PF) 1 % IJ SOLN: 5.0000 mL | INTRAMUSCULAR | Status: DC | PRN

## 2015-06-25 MED ORDER — ALTEPLASE 2 MG IJ SOLR: 2.0000 mg | Freq: Once | INTRAMUSCULAR | Status: DC | PRN

## 2015-06-25 MED ORDER — LIDOCAINE-PRILOCAINE 2.5-2.5 % EX CREA: 1.0000 "application " | TOPICAL_CREAM | CUTANEOUS | Status: DC | PRN

## 2015-06-25 MED ORDER — PENTAFLUOROPROP-TETRAFLUOROETH EX AERO: 1.0000 "application " | INHALATION_SPRAY | CUTANEOUS | Status: DC | PRN

## 2015-06-25 NOTE — Progress Notes (Signed)
Patient stated that his wife wanted to speak with me.  Updated wife on the status of the patient.  Jillyn Ledger, MBA, BSN, RN

## 2015-06-25 NOTE — Progress Notes (Signed)
Brad [name] Progress Note  Assessment/Plan: 1.  Abdominal Pain/Enteritis: per primary. On IV flagyl and Cipro. No fevers. Diarrhea/Vomiting improved, still C/O pain. NG tube to intermittent  Suction. CCS following. CT done 03/19 show adynamic ileius, Acute appendicitis with proximal appendiceal rupture and small periappendiceal phlegmon. Conservative treatment for now. Interval appendectomy in 6-8 weeks. 2.  Hyperkalemia: . Resolved with HD 3.  ESRD - MWF at [facility]. For HD today. K+4.3 4.  Hypertension/volume -. On Amlodipine 10 mg PO Q hs and Metoprolol succ 50 mg PO Q hs . On hold due to being NPO. Kept even Wed and Friday on HD with post weight 48.9 (EDW 48) Run even today.  5. Anemia -HGB 8.7 downward trend. Aranesp 60 3/17. Follow HGB 6. Metabolic bone disease - No binders on OP med list. Last phos 4.0. Hold VDRA due to being NPO.  7. Nutrition: NPO with NG. IVF-D5NS@125cc /Hr.  8. Volume - is 1-2 kg over dry wt, getting IVF"s given NPO status and NG drainage  Rita H. Brown NP-C 06/25/2015, 11:12 AM  [name] [phone]  Pt seen, examined, agree w assess/plan as above with additions as indicated.  Kelly Splinter MD [name] pager [phone]    cell [phone] 06/25/2015, 1:34 PM     Subjective:  "My stomach still hurts". Up in chair, alert. Guarding abdomen.   Objective Filed Vitals:   06/24/15 1217 06/24/15 1806 06/24/15 2002 06/25/15 1000  BP: 88/59 125/54 116/67 121/55  Pulse: 106 82 53 58  Temp: 98.5 F (36.9 C) 98.9 F (37.2 C) 98.9 F (37.2 C) 98 F (36.7 C)  TempSrc: Oral Oral Oral Oral  Resp: 18 16 18 18   Height:      Weight:   49.6 kg (109 lb 5.6 oz)   SpO2:  96% 96% 98%   Physical Exam General:Thin chronically ill appearing male in NAD Heart: S1, S2, II/VI systolic M Lungs: Bilateral breath sounds CTA.  Abdomen: soft, guarding-still tender LL and RL quadrants. No BS. NG patent with bile green drainage.    Extremities: PIV in L foot. No LLE edema Dialysis Access: left AVG + bruit  Dialysis Orders: Center: [facility] on MWF . 3 hrs 45 min, 180NRe Optiflux, BFR 400, DFR Manual 800 mL/min, EDW 48 (kg), Dialysate 2.0 K, 2.0 Ca, UFR Profile: Profile 4, Sodium Model: None, Access: LFA AV Graft Heparin: No heparin Mircera: 75 mcg IV q 2 weeks (recently restarted)  Additional Objective Labs: Basic Metabolic Panel:  Recent Labs Lab 06/22/15 0755 06/23/15 0625 06/24/15 0524 06/25/15 0536  NA 139 136 138 140  K 3.5 5.0 4.4 4.3  CL 98* 97* 99* 101  CO2 26 27 24 26   GLUCOSE 84 89 76 130*  BUN 25* 21* 36* 46*  CREATININE 3.56* 4.02* 5.84* 7.25*  CALCIUM 8.4* 8.3* 8.5* 8.2*  PHOS 2.5 2.3*  --  4.0   Liver Function Tests:  Recent Labs Lab 06/07/2015 1400 06/22/15 0755 06/23/15 0625 06/25/15 0536  AST 47*  --   --   --   ALT 24  --   --   --   ALKPHOS 105  --   --   --   BILITOT 1.1  --   --   --   PROT 7.2  --   --   --   ALBUMIN 2.9* 2.1* 2.1* 1.8*    Recent Labs Lab 06/25/2015 1400  LIPASE 18   CBC:  Recent Labs Lab 06/18/2015 1400  06/21/15 0556 06/22/15 0755 06/23/15 0625 06/24/15 0524 06/25/15 0536  WBC 6.5  < > 11.4* 7.0 7.2 7.2 8.8  NEUTROABS 5.6  --   --   --   --   --   --   HGB 10.6*  < > 9.3* 7.8* 9.4* 9.1* 8.7*  HCT 32.0*  < > 26.9* 23.4* 27.6* 27.9* 25.2*  MCV 83.1  < > 81.8 82.1 81.9 82.5 82.1  PLT 141*  < > 137* 108* 101* 95* 119*  < > = values in this interval not displayed. Blood Culture    Component Value Date/Time   SDES BLOOD RIGHT FOOT 12/06/2014 1602   SPECREQUEST BOTTLES DRAWN AEROBIC ONLY  5CC 12/06/2014 1602   CULT NO GROWTH 5 DAYS 12/06/2014 1602   REPTSTATUS 12/11/2014 FINAL 12/06/2014 1602    Cardiac Enzymes: No results for input(s): CKTOTAL, CKMB, CKMBINDEX, TROPONINI in the last 168 hours. CBG:  Recent Labs Lab 06/20/2015 1744 06/20/15 0438 06/20/15 0537 06/20/15 2001  GLUCAP 90 40* 100* 92   Iron Studies: No results for  input(s): IRON, TIBC, TRANSFERRIN, FERRITIN in the last 72 hours. @lablastinr3 @ Studies/Results: Ct Abdomen Pelvis W Contrast  06/24/2015  CLINICAL DATA:  Small bowel obstruction. Diffuse lower abdominal pain. EXAM: CT ABDOMEN AND PELVIS WITH CONTRAST TECHNIQUE: Multidetector CT imaging of the abdomen and pelvis was performed using the standard protocol following bolus administration of intravenous contrast. CONTRAST:  31mL OMNIPAQUE IOHEXOL 300 MG/ML  SOLN COMPARISON:  06/12/2015 CT abdomen/ pelvis. Abdominal radiographs from 1 day prior. FINDINGS: Lower chest: Small layering bilateral pleural effusions, left greater than right, increased bilaterally. Dependent atelectasis in both lower lobes. Coronary atherosclerosis. Hepatobiliary: Stable 2.4 cm focus of coarse calcification in the posterior lateral segment left liver lobe, probably from remote insult. No additional liver lesions. Mildly distended gallbladder with nonspecific mild-to-moderate diffuse gallbladder wall thickening. No definite pericholecystic fluid. No radiopaque cholelithiasis. There is new mild central intrahepatic biliary ductal dilatation. The common bile duct measures 7 mm diameter, slightly increased from 6 mm. There is a possible 4 mm round density in the lower third of the common bile duct (series 203/image 40), cannot exclude choledocholithiasis. Pancreas: Stable mild diffuse main pancreatic duct dilation (4 mm diameter). No pancreatic mass. No pancreatic thickening or peripancreatic fat stranding or fluid. Spleen: Normal size. No mass. Adrenals/Urinary Tract: Normal adrenals. No hydronephrosis. No renal stones. There are several simple cysts in both kidneys measuring up to 1.5 cm in the interpolar right kidney and 2.3 cm in the upper left kidney. There are innumerable additional subcentimeter hypodense renal cortical lesions in both kidneys, too small to characterize, not appreciably changed. Collapsed and grossly normal bladder.  Stomach/Bowel: Enteric tube terminates in the proximal stomach. The collapsed stomach is grossly normal. The appendix is diffusely dilated and fluid-filled measuring up to 14 mm diameter. The appendiceal wall is diffusely thickened and hyper enhancing. There is mild periappendiceal fat stranding. These findings are in keeping with acute appendicitis. There is an approximately 2 cm discontinuity in the proximal appendiceal wall with the surrounding ill-defined and a tiny focus of extraluminal gas, in keeping with appendiceal perforation and surrounding small phlegmon. There are mildly dilated small bowel loops throughout the jejunum. The distal small bowel is collapsed. No discrete small bowel caliber transition . The colon is relatively collapsed is filled with oral contrast, with no large bowel wall thickening. There is mild diverticulosis in the sigmoid colon. Rectal drainage tube is in place. Vascular/Lymphatic: Atherosclerotic nonaneurysmal abdominal aorta. Patent  portal, splenic, hepatic and renal veins. No pathologically enlarged lymph nodes in the abdomen or pelvis. Reproductive: Top-normal size prostate. Other: No ascites.  No frank pneumoperitoneum . Musculoskeletal: Re- demonstrated is severe erosive and sclerotic change at the L4-5 disc space, not appreciably changed back to 12/05/2014 lumbar spine MRI, worrisome for chronic discitis osteomyelitis. IMPRESSION: 1. Acute appendicitis with proximal appendiceal rupture and small periappendiceal phlegmon. 2. Mildly dilated proximal small bowel loops without discrete small bowel caliber transition, favor adynamic ileus. 3. Small layering bilateral pleural effusions, increased bilaterally. 4. Mild intrahepatic and extrahepatic biliary ductal dilatation, slightly increased. Questionable 4 mm hyperdense filling defect in the lower third of the common bile duct, cannot exclude choledocholithiasis. Nonspecific gallbladder distention and new diffuse gallbladder wall  thickening. No radiopaque gallstones in the gallbladder. Recommend correlation with serum bilirubin levels. Further evaluation with MRI abdomen with MRCP as clinically warranted. 5. Severe erosive changes at the L4-5 disc space, not appreciably changed back to 12/05/2014, favor chronic discitis osteomyelitis. These results were called by telephone at the time of interpretation on 06/24/2015 at 11:46 am to RN ANDY BRAKE, who verbally acknowledged these results. These results were called by telephone at the time of interpretation on 06/24/2015 at 11:50 am to Dr. Owens Loffler , who verbally acknowledged these results. Electronically Signed   By: Ilona Sorrel M.D.   On: 06/24/2015 11:52   Dg Abd Portable 1v  06/25/2015  CLINICAL DATA:  Small bowel obstruction. EXAM: PORTABLE ABDOMEN - 1 VIEW COMPARISON:  CT abdomen and pelvis 10/07/2007.  Abdomen 06/23/2015. FINDINGS: Enteric tube with tip in the left upper quadrant, likely in the upper stomach. Persistent gas distended small bowel with some decompression since previous study. Residual stool and contrast material in the colon. Contrast material in the colon suggests that there is no complete obstruction. No radiopaque stones. Degenerative changes in the spine. IMPRESSION: Persistent gas distention of small bowel although improved since previous study. Presence of contrast material in the colon suggest no complete obstruction. Electronically Signed   By: Lucienne Capers M.D.   On: 06/25/2015 01:39   Dg Abd Portable 1v  06/24/2015  CLINICAL DATA:  Nasogastric tube placement.  Initial encounter. EXAM: PORTABLE ABDOMEN - 1 VIEW COMPARISON:  Abdominal radiograph performed earlier today at 4:32 p.m. FINDINGS: The patient's enteric tube is noted ending overlying the body of the stomach. The side port is also noted at the body of the stomach. There is diffuse distention of small-bowel loops up to 7.0 cm, concerning for worsening high-grade small bowel obstruction. No free  intra-abdominal air is seen, though evaluation for free air is limited on a single supine view. No acute osseous abnormalities are identified. IMPRESSION: 1. Enteric tube noted ending overlying the body of the stomach. 2. Diffuse distention of small bowel loops up to 7.0 cm, concerning for worsening high-grade small bowel obstruction. No free intra-abdominal air seen. Electronically Signed   By: Garald Balding M.D.   On: 06/24/2015 00:47   Dg Abd Portable 1v  06/23/2015  CLINICAL DATA:  history of ESRD on HD (MWF), HTN, Gout who presented to the ED with ABD pain and diarrhea x 2 days. Evaluate SBO EXAM: PORTABLE ABDOMEN - 1 VIEW COMPARISON:  06/22/2015 FINDINGS: Persistent dilatation of central small bowel loops. Persistent particular debris within distended stomach. No evidence first free intraperitoneal air on this supine view of the abdomen. IMPRESSION: Persistent small bowel obstruction. Electronically Signed   By: Nolon Nations M.D.   On: 06/23/2015 16:52  Medications: . dextrose 5 % and 0.9% NaCl 125 mL/hr at 06/24/15 2236   . antiseptic oral rinse  7 mL Mouth Rinse q12n4p  . bisacodyl  10 mg Rectal Once  . chlorhexidine  15 mL Mouth Rinse BID  . cinacalcet  60 mg Oral Q breakfast  . ciprofloxacin  400 mg Intravenous Q24H  . darbepoetin (ARANESP) injection - DIALYSIS  60 mcg Intravenous Q Fri-HD  . feeding supplement (NEPRO CARB STEADY)  237 mL Oral BID BM  . heparin  5,000 Units Subcutaneous 3 times per day  . lanthanum  1,000 mg Oral BID WC  . metronidazole  500 mg Intravenous Q8H  . senna-docusate  1 tablet Oral BID

## 2015-06-25 NOTE — Care Management Important Message (Signed)
Important Message  Patient Details  Name: Brad [name] MRN: [id number] Date of Birth: [birth year]   Medicare Important Message Given:  Yes    [name] P Yunior Jain 06/25/2015, 4:35 PM

## 2015-06-25 NOTE — Progress Notes (Signed)
Patient ID: Brad [name], male   DOB: 1936-02-19, 80 y.o.   MRN: [id number]     [facility]      [address] [place], [place] [address]    Phone: [phone] FAX: [phone]     Subjective: 1373m ngt output, bilious. No n/v.  No flatus. Has a rectal tube. Afebrile. Normal WBC.    Objective:  Vital signs:  Filed Vitals:   06/24/15 0855 06/24/15 1217 06/24/15 1806 06/24/15 2002  BP: 97/47 88/59 125/54 116/67  Pulse: 116 106 82 53  Temp: 98.3 F (36.8 C) 98.5 F (36.9 C) 98.9 F (37.2 C) 98.9 F (37.2 C)  TempSrc: Oral Oral Oral Oral  Resp: '17 18 16 18  ' Height:      Weight:    49.6 kg (109 lb 5.6 oz)  SpO2: 100%  96% 96%    Last BM Date: 06/22/15  Intake/Output   Yesterday:  03/19 0701 - 03/20 0700 In: 2624.2 [P.O.:120; I.V.:2004.2; IV Piggyback:500] Out: 1350 [Emesis/NG output:1350] This shift:    I/O last 3 completed shifts: In: 3624.2 [P.O.:120; I.V.:2004.2; IV Piggyback:1500] Out: 2750 [Emesis/NG output:2750]    Physical Exam: General: Pt awake/alert/oriented x4 in no acute distress  Abdomen: Soft.  Nondistended.  Minimal ttp rlq.   No evidence of peritonitis.  No incarcerated hernias.   Problem List:   Principal Problem:   Enteritis Active Problems:   Gout, unspecified   HYPERTENSION, BENIGN SYSTEMIC   [place]   Protein-calorie malnutrition, severe (HCC)    Results:   Labs: Results for orders placed or performed during the hospital encounter of 06/22/2015 (from the past 48 hour(s))  CBC     Status: Abnormal   Collection Time: 06/24/15  5:24 AM  Result Value Ref Range   WBC 7.2 4.0 - 10.5 K/uL   RBC 3.38 (L) 4.22 - 5.81 MIL/uL   Hemoglobin 9.1 (L) 13.0 - 17.0 g/dL   HCT 27.9 (L) 39.0 - 52.0 %   MCV 82.5 78.0 - 100.0 fL   MCH 26.9 26.0 - 34.0 pg   MCHC 32.6 30.0 - 36.0 g/dL   RDW 15.3 11.5 - 15.5 %   Platelets 95 (L) 150 - 400 K/uL    Comment: CONSISTENT WITH  PREVIOUS RESULT  Basic metabolic panel     Status: Abnormal   Collection Time: 06/24/15  5:24 AM  Result Value Ref Range   Sodium 138 135 - 145 mmol/L   Potassium 4.4 3.5 - 5.1 mmol/L   Chloride 99 (L) 101 - 111 mmol/L   CO2 24 22 - 32 mmol/L   Glucose, Bld 76 65 - 99 mg/dL   BUN 36 (H) 6 - 20 mg/dL   Creatinine, Ser 5.84 (H) 0.61 - 1.24 mg/dL   Calcium 8.5 (L) 8.9 - 10.3 mg/dL   GFR calc non Af Amer 8 (L) >60 mL/min   GFR calc Af Amer 10 (L) >60 mL/min    Comment: (NOTE) The eGFR has been calculated using the CKD EPI equation. This calculation has not been validated in all clinical situations. eGFR's persistently <60 mL/min signify possible Chronic Kidney Disease.    Anion gap 15 5 - 15  Renal function panel     Status: Abnormal   Collection Time: 06/25/15  5:36 AM  Result Value Ref Range   Sodium 140 135 - 145 mmol/L   Potassium 4.3 3.5 - 5.1 mmol/L   Chloride 101 101 - 111  mmol/L   CO2 26 22 - 32 mmol/L   Glucose, Bld 130 (H) 65 - 99 mg/dL   BUN 46 (H) 6 - 20 mg/dL   Creatinine, Ser 7.25 (H) 0.61 - 1.24 mg/dL   Calcium 8.2 (L) 8.9 - 10.3 mg/dL   Phosphorus 4.0 2.5 - 4.6 mg/dL   Albumin 1.8 (L) 3.5 - 5.0 g/dL   GFR calc non Af Amer 6 (L) >60 mL/min   GFR calc Af Amer 7 (L) >60 mL/min    Comment: (NOTE) The eGFR has been calculated using the CKD EPI equation. This calculation has not been validated in all clinical situations. eGFR's persistently <60 mL/min signify possible Chronic Kidney Disease.    Anion gap 13 5 - 15  CBC     Status: Abnormal   Collection Time: 06/25/15  5:36 AM  Result Value Ref Range   WBC 8.8 4.0 - 10.5 K/uL   RBC 3.07 (L) 4.22 - 5.81 MIL/uL   Hemoglobin 8.7 (L) 13.0 - 17.0 g/dL   HCT 25.2 (L) 39.0 - 52.0 %   MCV 82.1 78.0 - 100.0 fL   MCH 28.3 26.0 - 34.0 pg   MCHC 34.5 30.0 - 36.0 g/dL   RDW 15.5 11.5 - 15.5 %   Platelets 119 (L) 150 - 400 K/uL    Comment: CONSISTENT WITH PREVIOUS RESULT    Imaging / Studies: Ct Abdomen Pelvis W  Contrast  06/24/2015  CLINICAL DATA:  Small bowel obstruction. Diffuse lower abdominal pain. EXAM: CT ABDOMEN AND PELVIS WITH CONTRAST TECHNIQUE: Multidetector CT imaging of the abdomen and pelvis was performed using the standard protocol following bolus administration of intravenous contrast. CONTRAST:  45m OMNIPAQUE IOHEXOL 300 MG/ML  SOLN COMPARISON:  06/23/2015 CT abdomen/ pelvis. Abdominal radiographs from 1 day prior. FINDINGS: Lower chest: Small layering bilateral pleural effusions, left greater than right, increased bilaterally. Dependent atelectasis in both lower lobes. Coronary atherosclerosis. Hepatobiliary: Stable 2.4 cm focus of coarse calcification in the posterior lateral segment left liver lobe, probably from remote insult. No additional liver lesions. Mildly distended gallbladder with nonspecific mild-to-moderate diffuse gallbladder wall thickening. No definite pericholecystic fluid. No radiopaque cholelithiasis. There is new mild central intrahepatic biliary ductal dilatation. The common bile duct measures 7 mm diameter, slightly increased from 6 mm. There is a possible 4 mm round density in the lower third of the common bile duct (series 203/image 40), cannot exclude choledocholithiasis. Pancreas: Stable mild diffuse main pancreatic duct dilation (4 mm diameter). No pancreatic mass. No pancreatic thickening or peripancreatic fat stranding or fluid. Spleen: Normal size. No mass. Adrenals/Urinary Tract: Normal adrenals. No hydronephrosis. No renal stones. There are several simple cysts in both kidneys measuring up to 1.5 cm in the interpolar right kidney and 2.3 cm in the upper left kidney. There are innumerable additional subcentimeter hypodense renal cortical lesions in both kidneys, too small to characterize, not appreciably changed. Collapsed and grossly normal bladder. Stomach/Bowel: Enteric tube terminates in the proximal stomach. The collapsed stomach is grossly normal. The appendix is  diffusely dilated and fluid-filled measuring up to 14 mm diameter. The appendiceal wall is diffusely thickened and hyper enhancing. There is mild periappendiceal fat stranding. These findings are in keeping with acute appendicitis. There is an approximately 2 cm discontinuity in the proximal appendiceal wall with the surrounding ill-defined and a tiny focus of extraluminal gas, in keeping with appendiceal perforation and surrounding small phlegmon. There are mildly dilated small bowel loops throughout the jejunum. The distal small bowel is  collapsed. No discrete small bowel caliber transition . The colon is relatively collapsed is filled with oral contrast, with no large bowel wall thickening. There is mild diverticulosis in the sigmoid colon. Rectal drainage tube is in place. Vascular/Lymphatic: Atherosclerotic nonaneurysmal abdominal aorta. Patent portal, splenic, hepatic and renal veins. No pathologically enlarged lymph nodes in the abdomen or pelvis. Reproductive: Top-normal size prostate. Other: No ascites.  No frank pneumoperitoneum . Musculoskeletal: Re- demonstrated is severe erosive and sclerotic change at the L4-5 disc space, not appreciably changed back to 12/05/2014 lumbar spine MRI, worrisome for chronic discitis osteomyelitis. IMPRESSION: 1. Acute appendicitis with proximal appendiceal rupture and small periappendiceal phlegmon. 2. Mildly dilated proximal small bowel loops without discrete small bowel caliber transition, favor adynamic ileus. 3. Small layering bilateral pleural effusions, increased bilaterally. 4. Mild intrahepatic and extrahepatic biliary ductal dilatation, slightly increased. Questionable 4 mm hyperdense filling defect in the lower third of the common bile duct, cannot exclude choledocholithiasis. Nonspecific gallbladder distention and new diffuse gallbladder wall thickening. No radiopaque gallstones in the gallbladder. Recommend correlation with serum bilirubin levels. Further  evaluation with MRI abdomen with MRCP as clinically warranted. 5. Severe erosive changes at the L4-5 disc space, not appreciably changed back to 12/05/2014, favor chronic discitis osteomyelitis. These results were called by telephone at the time of interpretation on 06/24/2015 at 11:46 am to RN ANDY BRAKE, who verbally acknowledged these results. These results were called by telephone at the time of interpretation on 06/24/2015 at 11:50 am to Dr. Owens Loffler , who verbally acknowledged these results. Electronically Signed   By: Ilona Sorrel M.D.   On: 06/24/2015 11:52   Dg Abd Portable 1v  06/25/2015  CLINICAL DATA:  Small bowel obstruction. EXAM: PORTABLE ABDOMEN - 1 VIEW COMPARISON:  CT abdomen and pelvis 10/07/2007.  Abdomen 06/23/2015. FINDINGS: Enteric tube with tip in the left upper quadrant, likely in the upper stomach. Persistent gas distended small bowel with some decompression since previous study. Residual stool and contrast material in the colon. Contrast material in the colon suggests that there is no complete obstruction. No radiopaque stones. Degenerative changes in the spine. IMPRESSION: Persistent gas distention of small bowel although improved since previous study. Presence of contrast material in the colon suggest no complete obstruction. Electronically Signed   By: Lucienne Capers M.D.   On: 06/25/2015 01:39   Dg Abd Portable 1v  06/24/2015  CLINICAL DATA:  Nasogastric tube placement.  Initial encounter. EXAM: PORTABLE ABDOMEN - 1 VIEW COMPARISON:  Abdominal radiograph performed earlier today at 4:32 p.m. FINDINGS: The patient's enteric tube is noted ending overlying the body of the stomach. The side port is also noted at the body of the stomach. There is diffuse distention of small-bowel loops up to 7.0 cm, concerning for worsening high-grade small bowel obstruction. No free intra-abdominal air is seen, though evaluation for free air is limited on a single supine view. No acute osseous  abnormalities are identified. IMPRESSION: 1. Enteric tube noted ending overlying the body of the stomach. 2. Diffuse distention of small bowel loops up to 7.0 cm, concerning for worsening high-grade small bowel obstruction. No free intra-abdominal air seen. Electronically Signed   By: Garald Balding M.D.   On: 06/24/2015 00:47   Dg Abd Portable 1v  06/23/2015  CLINICAL DATA:  history of ESRD on HD (MWF), HTN, Gout who presented to the ED with ABD pain and diarrhea x 2 days. Evaluate SBO EXAM: PORTABLE ABDOMEN - 1 VIEW COMPARISON:  06/22/2015 FINDINGS: Persistent  dilatation of central small bowel loops. Persistent particular debris within distended stomach. No evidence first free intraperitoneal air on this supine view of the abdomen. IMPRESSION: Persistent small bowel obstruction. Electronically Signed   By: Nolon Nations M.D.   On: 06/23/2015 16:52    Medications / Allergies:  Scheduled Meds: . antiseptic oral rinse  7 mL Mouth Rinse q12n4p  . chlorhexidine  15 mL Mouth Rinse BID  . cinacalcet  60 mg Oral Q breakfast  . ciprofloxacin  400 mg Intravenous Q24H  . darbepoetin (ARANESP) injection - DIALYSIS  60 mcg Intravenous Q Fri-HD  . feeding supplement (NEPRO CARB STEADY)  237 mL Oral BID BM  . heparin  5,000 Units Subcutaneous 3 times per day  . lanthanum  1,000 mg Oral BID WC  . metronidazole  500 mg Intravenous Q8H  . senna-docusate  1 tablet Oral BID   Continuous Infusions: . dextrose 5 % and 0.9% NaCl 125 mL/hr at 06/24/15 2236   PRN Meds:.[DISCONTINUED] acetaminophen **OR** acetaminophen, dextrose, HYDROmorphone (DILAUDID) injection, metoprolol, [DISCONTINUED] ondansetron **OR** ondansetron (ZOFRAN) IV  Antibiotics: Anti-infectives    Start     Dose/Rate Route Frequency Ordered Stop   06/20/15 2230  ciprofloxacin (CIPRO) IVPB 400 mg     400 mg 200 mL/hr over 60 Minutes Intravenous Every 24 hours 07/01/2015 2244     06/20/15 0500  metroNIDAZOLE (FLAGYL) IVPB 500 mg     500  mg 100 mL/hr over 60 Minutes Intravenous Every 8 hours 06/18/2015 2208     07/04/2015 2100  ciprofloxacin (CIPRO) IVPB 400 mg     400 mg 200 mL/hr over 60 Minutes Intravenous  Once 07/02/2015 2047 06/15/2015 2207   06/15/2015 2100  metroNIDAZOLE (FLAGYL) IVPB 500 mg     500 mg 100 mL/hr over 60 Minutes Intravenous  Once 06/14/2015 2047 06/26/2015 2207        Assessment/Plan Perforated appendicitis with phlegmon-continue with antibiotics.  Interval appendectomy in 6-8 weeks. Adynamic ileus-await bowel function, NGT decompression, needs to mobilize, will try dulcolax suppository     Erby Pian, [facility] Surgery Pager [phone](7A-4:30P) For consults and floor pages call [phone](7A-4:30P)  06/25/2015 8:24 AM

## 2015-06-25 NOTE — Progress Notes (Signed)
Patient ID: Brad [name], male   DOB: [birth year], 80 y.o.   MRN: [id number]  [facility] PROGRESS NOTE  [name] [id number] DOB: [birth year] DOA: 06/28/2015 PCP: Willene Hatchet, NP   Brief narrative:    80 y.o. male with a history of ESRD on HD (MWF), HTN, Gout who presented to the ED with ABD pain and diarrhea x 2 days.   In ED and A CT scan of the ABD revealed ? Enteritis, pt placed on IV Cipro and Flagyl and TRH asked to admit for further evaluation.   Major events since admission: 3/18 - vomiting, NGT placed 3/19 - worsening SBO on abd imaging, surgery consulted   Assessment/Plan:    Principal Problem:   RLQ abd pain / Adynamic ileus, appendicitis with rupture  - slight improvement, keep NGT in place for now  - continue flagyl and Cipro  - no plan for surgical intervention at this time  - keep NPO for now and provide analgesia as needed   Active Problems:   ? Cholelithiasis - will discuss with GI if further imaging needed     Hyperkalemia: - resolved  - BMP in AM    ESRD  - MWF at [facility] - appreciate nephrology team following   Hypertension, essential  - was low 3/19 so Norvasc was stopped 3/19  - monitor  Anemia of chronic disease/Thrombocytopenia - Hg improved since yesterday  - no sings of bleeding  - CBC In AM    Leukocytosis - CT chest with no signs of PNA, likely due to appendicitis  - WBC WNL, continue current ABX - CBC in AM    Underweight, Severe PCM - Body mass index is 15.85 kg/(m^2) - nutritionist consulted, appreciate recommendations   DVT prophylaxis - Heparin SQ  Code Status: Full.  Family Communication:  plan of care discussed with the patient Disposition Plan: Home when cleared by surgery and GI team   IV access:  Peripheral IV  Procedures and diagnostic studies:    Ct Abdomen Pelvis Wo Contrast 06/14/2015  Moderately distended small bowel throughout the abdomen and pelvis, fluid-filled throughout, most  prominent distension is of the upper abdominal small bowel and stomach. 2. Thickening of the walls of the distal small bowel (terminal ileum) is presumed to be partially obstructive given the dilatation of the more proximal small bowel and the relative decompression of the large bowel. This bowel wall thickening likely represents an enteritis of infectious, inflammatory or ischemic etiology, with associated inflammation and fluid stranding in the adjacent right lower quadrant mesentery. Recommend follow-up plain film to ensure the eventual passage of the oral contrast from the small bowel due to the large bowel thereby excluding a complete obstruction. 3. Additional chronic/incidental findings detailed above.  Dg Abd 1 View 06/22/2015   Improving small bowel obstruction.  Ct Chest Wo Contrast 06/21/2015  Mild emphysematous changes. 2. Bibasilar atelectasis. 3. Ectatic ascending aorta. Recommend annual imaging followup by CTA or MRA.   Dg Abd 1 View 06/22/2015  Improving small bowel obstruction.   Dg Abd Portable 1v 06/24/2015  Enteric tube noted ending overlying the body of the stomach. 2. Diffuse distention of small bowel loops up to 7.0 cm, concerning for worsening high-grade small bowel obstruction. No free intra-abdominal air seen.  Dg Abd Portable 1v 06/23/2015  Persistent small bowel obstruction.   Ct Abdomen Pelvis W Contrast 06/24/2015  Acute appendicitis with proximal appendiceal rupture and small periappendiceal phlegmon. 2. Mildly dilated proximal small bowel loops without  discrete small bowel caliber transition, favor adynamic ileus. 3. Small layering bilateral pleural effusions, increased bilaterally. 4. Mild intrahepatic and extrahepatic biliary ductal dilatation, slightly increased. Questionable 4 mm hyperdense filling defect in the lower third of the common bile duct, cannot exclude choledocholithiasis. Nonspecific gallbladder distention and new diffuse gallbladder wall thickening. No  radiopaque gallstones in the gallbladder. Recommend correlation with serum bilirubin levels. Further evaluation with MRI abdomen with MRCP as clinically warranted. 5. Severe erosive changes at the L4-5 disc space, not appreciably changed back to 12/05/2014, favor chronic discitis osteomyelitis.   Dg Abd Portable 1v 06/25/2015  Persistent gas distention of small bowel although improved since previous study. Presence of contrast material in the colon suggest no complete obstruction   Medical Consultants:  Nephrology  GI Surgery   Other Consultants:  None  IAnti-Infectives:   Cipro 3/14 --> Flagyl 3/14 -->  Faye Ramsay, MD  [facility] Pager [phone]  If 7PM-7AM, please contact night-coverage [email or web address] Password [email or web address] 06/25/2015, 11:35 AM   LOS: 6 days   HPI/Subjective: No events overnight. Vomiting 3/18, NGT placed 3/18, still with nausea but better overall.   Objective: Filed Vitals:   06/24/15 1217 06/24/15 1806 06/24/15 2002 06/25/15 1000  BP: 88/59 125/54 116/67 121/55  Pulse: 106 82 53 58  Temp: 98.5 F (36.9 C) 98.9 F (37.2 C) 98.9 F (37.2 C) 98 F (36.7 C)  TempSrc: Oral Oral Oral Oral  Resp: 18 16 18 18   Height:      Weight:   49.6 kg (109 lb 5.6 oz)   SpO2:  96% 96% 98%    Intake/Output Summary (Last 24 hours) at 06/25/15 1135 Last data filed at 06/25/15 0900  Gross per 24 hour  Intake 2744.17 ml  Output   1350 ml  Net 1394.17 ml    Exam:   General:  Pt is alert, follows commands appropriately, not in acute distress  Cardiovascular: Regular rhythm, tachycardic, no rubs, no gallops  Respiratory: Clear to auscultation bilaterally, no wheezing, diminished breath sounds at bases   Abdomen: Soft, tender in lower abd quadrants, non distended, NGT in place  Data Reviewed: Basic Metabolic Panel:  Recent Labs Lab 06/21/15 0556 06/22/15 0755 06/23/15 0625 06/24/15 0524 06/25/15 0536  NA 139 139 136 138 140  K 3.9 3.5 5.0 4.4 4.3  CL 93*  98* 97* 99* 101  CO2 28 26 27 24 26   GLUCOSE 90 84 89 76 130*  BUN 19 25* 21* 36* 46*  CREATININE 3.71* 3.56* 4.02* 5.84* 7.25*  CALCIUM 8.8* 8.4* 8.3* 8.5* 8.2*  PHOS  --  2.5 2.3*  --  4.0   Liver Function Tests:  Recent Labs Lab 07/02/2015 1400 06/22/15 0755 06/23/15 0625 06/25/15 0536  AST 47*  --   --   --   ALT 24  --   --   --   ALKPHOS 105  --   --   --   BILITOT 1.1  --   --   --   PROT 7.2  --   --   --   ALBUMIN 2.9* 2.1* 2.1* 1.8*    Recent Labs Lab 07/06/2015 1400  LIPASE 18   CBC:  Recent Labs Lab 06/18/2015 1400  06/21/15 0556 06/22/15 0755 06/23/15 0625 06/24/15 0524 06/25/15 0536  WBC 6.5  < > 11.4* 7.0 7.2 7.2 8.8  NEUTROABS 5.6  --   --   --   --   --   --   HGB 10.6*  < >  9.3* 7.8* 9.4* 9.1* 8.7*  HCT 32.0*  < > 26.9* 23.4* 27.6* 27.9* 25.2*  MCV 83.1  < > 81.8 82.1 81.9 82.5 82.1  PLT 141*  < > 137* 108* 101* 95* 119*  < > = values in this interval not displayed. CBG:  Recent Labs Lab 06/22/2015 1744 06/20/15 0438 06/20/15 0537 06/20/15 2001  GLUCAP 90 40* 100* 92    Recent Results (from the past 240 hour(s))  C difficile quick scan w PCR reflex     Status: None   Collection Time: 06/20/15  8:40 AM  Result Value Ref Range Status   C Diff antigen NEGATIVE NEGATIVE Final   C Diff toxin NEGATIVE NEGATIVE Final   C Diff interpretation Negative for toxigenic C. difficile  Final  MRSA PCR Screening     Status: None   Collection Time: 06/21/15 11:51 AM  Result Value Ref Range Status   MRSA by PCR NEGATIVE NEGATIVE Final    Comment:        The GeneXpert MRSA Assay (FDA approved for NASAL specimens only), is one component of a comprehensive MRSA colonization surveillance program. It is not intended to diagnose MRSA infection nor to guide or monitor treatment for MRSA infections.      Scheduled Meds: . antiseptic oral rinse  7 mL Mouth Rinse q12n4p  . bisacodyl  10 mg Rectal Once  . chlorhexidine  15 mL Mouth Rinse BID  .  cinacalcet  60 mg Oral Q breakfast  . ciprofloxacin  400 mg Intravenous Q24H  . darbepoetin (ARANESP) injection - DIALYSIS  60 mcg Intravenous Q Fri-HD  . feeding supplement (NEPRO CARB STEADY)  237 mL Oral BID BM  . heparin  5,000 Units Subcutaneous 3 times per day  . lanthanum  1,000 mg Oral BID WC  . metronidazole  500 mg Intravenous Q8H  . senna-docusate  1 tablet Oral BID   Continuous Infusions: . dextrose 5 % and 0.9% NaCl 125 mL/hr at 06/25/15 1118

## 2015-06-25 NOTE — Progress Notes (Signed)
Patient's son, Maximo Raineri, called to check on patient.  Family expressed concern for the patient.  Family asking questions that I could not answer.  Family stated that he would possibly come to the hospital in the AM & wanted to speak with the MD.  Informed the family the hours the MD is in the building, but I could not guarantee the time the that physician would round.  Jillyn Ledger, MBA, BSN, RN

## 2015-06-25 NOTE — Progress Notes (Signed)
Physical Therapy Treatment Patient Details Name: XAVYER SULEWSKI MRN: [id number] DOB: [birth year] Today's Date: 06/25/2015    History of Present Illness 80 yo male with onset of enteritis with liquid stool, has flexiseal and generalized weakness.  PMHx:  ESRD, anemia, gout, rash, R arm amputation    PT Comments    Pt was able to stand and get to chair but did not feel energetic due to NPO x ice and ongoing NG tube.  MD has positive response to his treatment and will still expect him to go to SNF as he is much more inactive, needing assist for any mobility.  Focus on standing and using SPC when balance allows or possibly hemiwalker.  Follow Up Recommendations  SNF     Equipment Recommendations  None recommended by PT    Recommendations for Other Services Rehab consult     Precautions / Restrictions Precautions Precautions: Fall (flexi removed) Restrictions Weight Bearing Restrictions: No    Mobility  Bed Mobility Overal bed mobility: Needs Assistance Bed Mobility: Supine to Sit     Supine to sit: Min assist;Mod assist     General bed mobility comments: supported trunk and slid hips with pad to EOB but pt helping  Transfers Overall transfer level: Needs assistance Equipment used: 1 person hand held assist Transfers: Sit to/from [facility] Sit to Stand: Min assist;Mod assist Stand pivot transfers: Mod assist;Min assist       General transfer comment: used LUE for support and pt sidesteps to chair, weak but willing to try  Ambulation/Gait             General Gait Details: sidesteps in transfer to chair   Stairs            Wheelchair Mobility    Modified Rankin (Stroke Patients Only)       Balance Overall balance assessment: Needs assistance Sitting-balance support: Feet supported Sitting balance-Leahy Scale: Fair     Standing balance support: Single extremity supported Standing balance-Leahy Scale: Poor                       Cognition Arousal/Alertness: Awake/alert Behavior During Therapy: WFL for tasks assessed/performed Overall Cognitive Status: Within Functional Limits for tasks assessed                      Exercises      General Comments General comments (skin integrity, edema, etc.): Pt is up to chair after nursing removes flexiseal and still has an IV in LLE.  Will push for more mobility and if pt able will plan 2 person assist to priotect his LLE line      Pertinent Vitals/Pain Pain Assessment: No/denies pain    Home Living                      Prior Function            PT Goals (current goals can now be found in the care plan section) Progress towards PT goals: Progressing toward goals    Frequency  Min 2X/week    PT Plan Current plan remains appropriate    Co-evaluation             End of Session Equipment Utilized During Treatment: Oxygen Activity Tolerance: Patient tolerated treatment well;Patient limited by fatigue Patient left: in chair;with call bell/phone within reach;with chair alarm set     Time: [id number] PT Time Calculation (min) (ACUTE ONLY): 27  min  Charges:  $Therapeutic Activity: 23-37 mins                    G Codes:      Ramond Dial Jul 01, 2015, 10:32 AM   Mee Hives, PT MS Acute Rehab Dept. Number: [facility] [phone] and [place] [phone]

## 2015-06-26 DIAGNOSIS — R1031 Right lower quadrant pain: Secondary | ICD-10-CM | POA: Diagnosis present

## 2015-06-26 DIAGNOSIS — R636 Underweight: Secondary | ICD-10-CM | POA: Diagnosis present

## 2015-06-26 DIAGNOSIS — D696 Thrombocytopenia, unspecified: Secondary | ICD-10-CM | POA: Diagnosis present

## 2015-06-26 DIAGNOSIS — K3580 Unspecified acute appendicitis: Secondary | ICD-10-CM | POA: Diagnosis present

## 2015-06-26 LAB — CBC
HCT: 24.2 % — ABNORMAL LOW (ref 39.0–52.0)
Hemoglobin: 8 g/dL — ABNORMAL LOW (ref 13.0–17.0)
MCH: 27 pg (ref 26.0–34.0)
MCHC: 33.1 g/dL (ref 30.0–36.0)
MCV: 81.8 fL (ref 78.0–100.0)
PLATELETS: 147 10*3/uL — AB (ref 150–400)
RBC: 2.96 MIL/uL — AB (ref 4.22–5.81)
RDW: 15.3 % (ref 11.5–15.5)
WBC: 8.9 10*3/uL (ref 4.0–10.5)

## 2015-06-26 LAB — RENAL FUNCTION PANEL
ALBUMIN: 1.8 g/dL — AB (ref 3.5–5.0)
Anion gap: 9 (ref 5–15)
BUN: 15 mg/dL (ref 6–20)
CHLORIDE: 102 mmol/L (ref 101–111)
CO2: 29 mmol/L (ref 22–32)
CREATININE: 4.18 mg/dL — AB (ref 0.61–1.24)
Calcium: 8.1 mg/dL — ABNORMAL LOW (ref 8.9–10.3)
GFR calc Af Amer: 14 mL/min — ABNORMAL LOW (ref >60)
GFR, EST NON AFRICAN AMERICAN: 13 mL/min — AB (ref >60)
GLUCOSE: 125 mg/dL — AB (ref 65–99)
POTASSIUM: 3.3 mmol/L — AB (ref 3.5–5.1)
Phosphorus: 3 mg/dL (ref 2.5–4.6)
SODIUM: 140 mmol/L (ref 135–145)

## 2015-06-26 MED ORDER — BOOST / RESOURCE BREEZE PO LIQD
1.0000 | Freq: Three times a day (TID) | ORAL | Status: DC
  Administered 2015-06-26 – 2015-06-30 (×4): 1 via ORAL

## 2015-06-26 NOTE — Progress Notes (Signed)
Patient ID: Brad [name], male   DOB: 12/23/1935, 80 y.o.   MRN: [id number]     [facility]      [address] [place], [place] [address]    Phone: [phone] FAX: [phone]     Subjective: Started having BMs yesterday, 2 since this AM. Taking in ice chips. 1352m NGT output yesterday. No n/v, pain.   Objective:  Vital signs:  Filed Vitals:   06/25/15 1700 06/25/15 1730 06/25/15 1750 06/25/15 2025  BP: 104/56 119/46 119/54 119/65  Pulse: 99 73 94 84  Temp:   97.4 F (36.3 C) 98.2 F (36.8 C)  TempSrc:   Oral Axillary  Resp:   24 21  Height:      Weight:   50.6 kg (111 lb 8.8 oz) 50.2 kg (110 lb 10.7 oz)  SpO2:   100% 100%    Last BM Date: 06/22/15  Intake/Output   Yesterday:  03/20 0701 - 03/21 0700 In: 3500 [I.V.:3000; IV Piggyback:500] Out: 1300 [Emesis/NG output:1300] This shift:    Physical Exam: General: Pt awake/alert/oriented x4 in no acute distress  Abdomen: Soft. Nondistended.non tender. No evidence of peritonitis. No incarcerated hernias.   Problem List:   Principal Problem:   Enteritis Active Problems:   Gout, unspecified   HYPERTENSION, BENIGN SYSTEMIC   [place]   Protein-calorie malnutrition, severe (HCC)    Results:   Labs: Results for orders placed or performed during the hospital encounter of 06/23/2015 (from the past 48 hour(s))  Renal function panel     Status: Abnormal   Collection Time: 06/25/15  5:36 AM  Result Value Ref Range   Sodium 140 135 - 145 mmol/L   Potassium 4.3 3.5 - 5.1 mmol/L   Chloride 101 101 - 111 mmol/L   CO2 26 22 - 32 mmol/L   Glucose, Bld 130 (H) 65 - 99 mg/dL   BUN 46 (H) 6 - 20 mg/dL   Creatinine, Ser 7.25 (H) 0.61 - 1.24 mg/dL   Calcium 8.2 (L) 8.9 - 10.3 mg/dL   Phosphorus 4.0 2.5 - 4.6 mg/dL   Albumin 1.8 (L) 3.5 - 5.0 g/dL   GFR calc non Af Amer 6 (L) >60 mL/min   GFR calc Af Amer 7 (L) >60 mL/min    Comment: (NOTE) The  eGFR has been calculated using the CKD EPI equation. This calculation has not been validated in all clinical situations. eGFR's persistently <60 mL/min signify possible Chronic Kidney Disease.    Anion gap 13 5 - 15  CBC     Status: Abnormal   Collection Time: 06/25/15  5:36 AM  Result Value Ref Range   WBC 8.8 4.0 - 10.5 K/uL   RBC 3.07 (L) 4.22 - 5.81 MIL/uL   Hemoglobin 8.7 (L) 13.0 - 17.0 g/dL   HCT 25.2 (L) 39.0 - 52.0 %   MCV 82.1 78.0 - 100.0 fL   MCH 28.3 26.0 - 34.0 pg   MCHC 34.5 30.0 - 36.0 g/dL   RDW 15.5 11.5 - 15.5 %   Platelets 119 (L) 150 - 400 K/uL    Comment: CONSISTENT WITH PREVIOUS RESULT  Renal function panel     Status: Abnormal   Collection Time: 06/26/15  4:11 AM  Result Value Ref Range   Sodium 140 135 - 145 mmol/L   Potassium 3.3 (L) 3.5 - 5.1 mmol/L    Comment: DELTA CHECK NOTED   Chloride 102 101 - 111  mmol/L   CO2 29 22 - 32 mmol/L   Glucose, Bld 125 (H) 65 - 99 mg/dL   BUN 15 6 - 20 mg/dL   Creatinine, Ser 4.18 (H) 0.61 - 1.24 mg/dL    Comment: DELTA CHECK NOTED   Calcium 8.1 (L) 8.9 - 10.3 mg/dL   Phosphorus 3.0 2.5 - 4.6 mg/dL   Albumin 1.8 (L) 3.5 - 5.0 g/dL   GFR calc non Af Amer 13 (L) >60 mL/min   GFR calc Af Amer 14 (L) >60 mL/min    Comment: (NOTE) The eGFR has been calculated using the CKD EPI equation. This calculation has not been validated in all clinical situations. eGFR's persistently <60 mL/min signify possible Chronic Kidney Disease.    Anion gap 9 5 - 15  CBC     Status: Abnormal   Collection Time: 06/26/15  4:38 AM  Result Value Ref Range   WBC 8.9 4.0 - 10.5 K/uL   RBC 2.96 (L) 4.22 - 5.81 MIL/uL   Hemoglobin 8.0 (L) 13.0 - 17.0 g/dL   HCT 24.2 (L) 39.0 - 52.0 %   MCV 81.8 78.0 - 100.0 fL   MCH 27.0 26.0 - 34.0 pg   MCHC 33.1 30.0 - 36.0 g/dL   RDW 15.3 11.5 - 15.5 %   Platelets 147 (L) 150 - 400 K/uL    Imaging / Studies: Dg Abd Portable 1v  06/25/2015  CLINICAL DATA:  Small bowel obstruction. EXAM: PORTABLE  ABDOMEN - 1 VIEW COMPARISON:  CT abdomen and pelvis 10/07/2007.  Abdomen 06/23/2015. FINDINGS: Enteric tube with tip in the left upper quadrant, likely in the upper stomach. Persistent gas distended small bowel with some decompression since previous study. Residual stool and contrast material in the colon. Contrast material in the colon suggests that there is no complete obstruction. No radiopaque stones. Degenerative changes in the spine. IMPRESSION: Persistent gas distention of small bowel although improved since previous study. Presence of contrast material in the colon suggest no complete obstruction. Electronically Signed   By: Brad [name] M.D.   On: 06/25/2015 01:39    Medications / Allergies:  Scheduled Meds: . antiseptic oral rinse  7 mL Mouth Rinse q12n4p  . chlorhexidine  15 mL Mouth Rinse BID  . cinacalcet  60 mg Oral Q breakfast  . ciprofloxacin  400 mg Intravenous Q24H  . darbepoetin (ARANESP) injection - DIALYSIS  60 mcg Intravenous Q Fri-HD  . feeding supplement (NEPRO CARB STEADY)  237 mL Oral BID BM  . heparin  5,000 Units Subcutaneous 3 times per day  . lanthanum  1,000 mg Oral BID WC  . metronidazole  500 mg Intravenous Q8H  . senna-docusate  1 tablet Oral BID   Continuous Infusions: . dextrose 5 % and 0.9% NaCl 125 mL/hr at 06/26/15 0529   PRN Meds:.[DISCONTINUED] acetaminophen **OR** acetaminophen, dextrose, HYDROmorphone (DILAUDID) injection, metoprolol, [DISCONTINUED] ondansetron **OR** ondansetron (ZOFRAN) IV  Antibiotics: Anti-infectives    Start     Dose/Rate Route Frequency Ordered Stop   06/20/15 2230  ciprofloxacin (CIPRO) IVPB 400 mg     400 mg 200 mL/hr over 60 Minutes Intravenous Every 24 hours 07/04/2015 2244     06/20/15 0500  metroNIDAZOLE (FLAGYL) IVPB 500 mg     500 mg 100 mL/hr over 60 Minutes Intravenous Every 8 hours 06/27/2015 2208     06/14/2015 2100  ciprofloxacin (CIPRO) IVPB 400 mg     400 mg 200 mL/hr over 60 Minutes Intravenous  Once  06/16/2015 2047  06/13/2015 2207   06/21/2015 2100  metroNIDAZOLE (FLAGYL) IVPB 500 mg     500 mg 100 mL/hr over 60 Minutes Intravenous  Once 07/02/2015 2047 07/04/2015 2207        Assessment/Plan Perforated appendicitis with phlegmon-continue with antibiotics. Interval appendectomy in 6-8 weeks. Adynamic ileus-clamp NGT and allow for clears.  Back to suction if nausea, vomiting or bloating develops.    Erby Pian, [facility] Pager [phone]) For consults and floor pages call [phone](7A-4:30P)  06/26/2015 9:51 AM

## 2015-06-26 NOTE — Progress Notes (Signed)
Subjective: Feeling better.  No complaints.  Objective: Vital signs in last 24 hours: Temp:  [97.4 F (36.3 C)-98.4 F (36.9 C)] 98.4 F (36.9 C) (03/21 1045) Pulse Rate:  [70-99] 73 (03/21 1045) Resp:  [21-24] 23 (03/21 1045) BP: (104-128)/(45-65) 127/46 mmHg (03/21 1045) SpO2:  [93 %-100 %] 93 % (03/21 1045) Weight:  [50.2 kg (110 lb 10.7 oz)-50.6 kg (111 lb 8.8 oz)] 50.2 kg (110 lb 10.7 oz) (03/20 2025) Last BM Date: 06/26/15  Intake/Output from previous day: 03/20 0701 - 03/21 0700 In: 3500 [I.V.:3000; IV Piggyback:500] Out: 1300 [Emesis/NG output:1300] Intake/Output this shift: Total I/O In: 480 [P.O.:480] Out: -   General appearance: alert and no distress GI: soft, distended, tympanic  Lab Results:  Recent Labs  06/24/15 0524 06/25/15 0536 06/26/15 0438  WBC 7.2 8.8 8.9  HGB 9.1* 8.7* 8.0*  HCT 27.9* 25.2* 24.2*  PLT 95* 119* 147*   BMET  Recent Labs  06/24/15 0524 06/25/15 0536 06/26/15 0411  NA 138 140 140  K 4.4 4.3 3.3*  CL 99* 101 102  CO2 24 26 29   GLUCOSE 76 130* 125*  BUN 36* 46* 15  CREATININE 5.84* 7.25* 4.18*  CALCIUM 8.5* 8.2* 8.1*   LFT  Recent Labs  06/26/15 0411  ALBUMIN 1.8*   PT/INR No results for input(s): LABPROT, INR in the last 72 hours. Hepatitis Panel No results for input(s): HEPBSAG, HCVAB, HEPAIGM, HEPBIGM in the last 72 hours. C-Diff No results for input(s): CDIFFTOX in the last 72 hours. Fecal Lactopherrin No results for input(s): FECLLACTOFRN in the last 72 hours.  Studies/Results: Dg Abd Portable 1v  06/25/2015  CLINICAL DATA:  Small bowel obstruction. EXAM: PORTABLE ABDOMEN - 1 VIEW COMPARISON:  CT abdomen and pelvis 10/07/2007.  Abdomen 06/23/2015. FINDINGS: Enteric tube with tip in the left upper quadrant, likely in the upper stomach. Persistent gas distended small bowel with some decompression since previous study. Residual stool and contrast material in the colon. Contrast material in the colon suggests  that there is no complete obstruction. No radiopaque stones. Degenerative changes in the spine. IMPRESSION: Persistent gas distention of small bowel although improved since previous study. Presence of contrast material in the colon suggest no complete obstruction. Electronically Signed   By: Lucienne Capers M.D.   On: 06/25/2015 01:39    Medications:  Scheduled: . antiseptic oral rinse  7 mL Mouth Rinse q12n4p  . chlorhexidine  15 mL Mouth Rinse BID  . cinacalcet  60 mg Oral Q breakfast  . ciprofloxacin  400 mg Intravenous Q24H  . darbepoetin (ARANESP) injection - DIALYSIS  60 mcg Intravenous Q Fri-HD  . feeding supplement  1 Container Oral TID BM  . heparin  5,000 Units Subcutaneous 3 times per day  . lanthanum  1,000 mg Oral BID WC  . metronidazole  500 mg Intravenous Q8H  . senna-docusate  1 tablet Oral BID   Continuous: . dextrose 5 % and 0.9% NaCl 75 mL/hr at 06/26/15 1103    Assessment/Plan: 1) Ileus. 2) Ruptured appendix.   The pat has improved clinically.  He has an ileus from a rupture appendicitis.  The NG tube is helping and he reports passing flatus and bowel movements.  Hopefully with IV antibiotics and the NG tube his ileus will resolve.  Plan: 1) Continue with IV antibiotics and NG tube. 2) No further GI recommendations.  Signing off.   LOS: 7 days   Jazir Newey D 06/26/2015, 2:32 PM

## 2015-06-26 NOTE — Progress Notes (Addendum)
Patient ID: Brad [name], male   DOB: 1935/07/03, 80 y.o.   MRN: [id number]  [facility] PROGRESS NOTE  Brad [name] [id number] DOB: 02/20/1936 DOA: 06/25/2015 PCP: Willene Hatchet, NP   Brief narrative:    81 y.o. male with a history of ESRD on HD (MWF), HTN, Gout who presented to the ED with ABD pain and diarrhea x 2 days.   In ED and A CT scan of the ABD revealed ? Enteritis, pt placed on IV Cipro and Flagyl and TRH asked to admit for further evaluation.   Major events since admission: 3/18 - vomiting, NGT placed 3/19 - worsening SBO on abd imaging, surgery consulted  3/21 - clamp NGT, clear liquid diet allowed if pt able to tolerate   Assessment/Plan:    Principal Problem:   RLQ abd pain / Adynamic ileus, appendicitis with rupture  - slight improvement, plan to clamp the tube today to see how pt does - if pt develops nausea or vomiting, back to suction  - continue flagyl and Cipro  - no plan for surgical intervention at this time   Active Problems   ? Cholelithiasis - per GI, no plan further interventions at this time until acute issues resolved     Hyperkalemia - K on low side this AM, address with HD - BMP in AM    ESRD   - MWF at [facility] - appreciate nephrology team following   Hypertension, essential  - was low 3/19 so Norvasc was stopped 3/19  - currently reasonable stable   Anemia of chronic disease/Thrombocytopenia - Hg overall stable, Plt count improving  - no sings of bleeding  - CBC In AM    Leukocytosis - CT chest with no signs of PNA, likely due to appendicitis  - WBC WNL, continue current ABX - CBC in AM    Underweight, Severe PCM - Body mass index is 15.85 kg/(m^2) - nutritionist consulted, appreciate recommendations   DVT prophylaxis - Heparin SQ  Code Status: Full.  Family Communication:  plan of care discussed with the patient Disposition Plan: Home when cleared by surgery and GI team   IV access:  Peripheral  IV  Procedures and diagnostic studies:    Ct Abdomen Pelvis Wo Contrast 06/27/2015  Moderately distended small bowel throughout the abdomen and pelvis, fluid-filled throughout, most prominent distension is of the upper abdominal small bowel and stomach. 2. Thickening of the walls of the distal small bowel (terminal ileum) is presumed to be partially obstructive given the dilatation of the more proximal small bowel and the relative decompression of the large bowel. This bowel wall thickening likely represents an enteritis of infectious, inflammatory or ischemic etiology, with associated inflammation and fluid stranding in the adjacent right lower quadrant mesentery. Recommend follow-up plain film to ensure the eventual passage of the oral contrast from the small bowel due to the large bowel thereby excluding a complete obstruction. 3. Additional chronic/incidental findings detailed above.  Dg Abd 1 View 06/22/2015   Improving small bowel obstruction.  Ct Chest Wo Contrast 06/21/2015  Mild emphysematous changes. 2. Bibasilar atelectasis. 3. Ectatic ascending aorta. Recommend annual imaging followup by CTA or MRA.   Dg Abd 1 View 06/22/2015  Improving small bowel obstruction.   Dg Abd Portable 1v 06/24/2015  Enteric tube noted ending overlying the body of the stomach. 2. Diffuse distention of small bowel loops up to 7.0 cm, concerning for worsening high-grade small bowel obstruction. No free intra-abdominal air seen.  Dg  Abd Portable 1v 06/23/2015  Persistent small bowel obstruction.   Ct Abdomen Pelvis W Contrast 06/24/2015  Acute appendicitis with proximal appendiceal rupture and small periappendiceal phlegmon. 2. Mildly dilated proximal small bowel loops without discrete small bowel caliber transition, favor adynamic ileus. 3. Small layering bilateral pleural effusions, increased bilaterally. 4. Mild intrahepatic and extrahepatic biliary ductal dilatation, slightly increased. Questionable 4 mm  hyperdense filling defect in the lower third of the common bile duct, cannot exclude choledocholithiasis. Nonspecific gallbladder distention and new diffuse gallbladder wall thickening. No radiopaque gallstones in the gallbladder. Recommend correlation with serum bilirubin levels. Further evaluation with MRI abdomen with MRCP as clinically warranted. 5. Severe erosive changes at the L4-5 disc space, not appreciably changed back to 12/05/2014, favor chronic discitis osteomyelitis.   Dg Abd Portable 1v 06/25/2015  Persistent gas distention of small bowel although improved since previous study. Presence of contrast material in the colon suggest no complete obstruction   Medical Consultants:  Nephrology  GI Surgery   Other Consultants:  None  IAnti-Infectives:   Cipro 3/14 --> Flagyl 3/14 -->  Faye Ramsay, MD  [facility] Pager [phone]  If 7PM-7AM, please contact night-coverage [email or web address] Password [email or web address] 06/26/2015, 10:59 AM   LOS: 7 days   HPI/Subjective: No events overnight. Vomiting 3/18, NGT placed 3/18, reports feeling better.   Objective: Filed Vitals:   06/25/15 1700 06/25/15 1730 06/25/15 1750 06/25/15 2025  BP: 104/56 119/46 119/54 119/65  Pulse: 99 73 94 84  Temp:   97.4 F (36.3 C) 98.2 F (36.8 C)  TempSrc:   Oral Axillary  Resp:   24 21  Height:      Weight:   50.6 kg (111 lb 8.8 oz) 50.2 kg (110 lb 10.7 oz)  SpO2:   100% 100%    Intake/Output Summary (Last 24 hours) at 06/26/15 1059 Last data filed at 06/26/15 0900  Gross per 24 hour  Intake   3740 ml  Output   1300 ml  Net   2440 ml    Exam:   General:  Pt is alert, follows commands appropriately, not in acute distress  Cardiovascular: Regular rhythm, no rubs, no gallops  Respiratory: Clear to auscultation bilaterally, no wheezing, diminished breath sounds at bases   Abdomen: Soft, tender in lower abd quadrants, non distended, NGT in place  Data Reviewed: Basic Metabolic Panel:  Recent  Labs Lab 06/22/15 0755 06/23/15 0625 06/24/15 0524 06/25/15 0536 06/26/15 0411  NA 139 136 138 140 140  K 3.5 5.0 4.4 4.3 3.3*  CL 98* 97* 99* 101 102  CO2 26 27 24 26 29   GLUCOSE 84 89 76 130* 125*  BUN 25* 21* 36* 46* 15  CREATININE 3.56* 4.02* 5.84* 7.25* 4.18*  CALCIUM 8.4* 8.3* 8.5* 8.2* 8.1*  PHOS 2.5 2.3*  --  4.0 3.0   Liver Function Tests:  Recent Labs Lab 06/23/2015 1400 06/22/15 0755 06/23/15 0625 06/25/15 0536 06/26/15 0411  AST 47*  --   --   --   --   ALT 24  --   --   --   --   ALKPHOS 105  --   --   --   --   BILITOT 1.1  --   --   --   --   PROT 7.2  --   --   --   --   ALBUMIN 2.9* 2.1* 2.1* 1.8* 1.8*    Recent Labs Lab 06/15/2015 1400  LIPASE 18   CBC:  Recent  Labs Lab 06/27/2015 1400  06/22/15 0755 06/23/15 0625 06/24/15 0524 06/25/15 0536 06/26/15 0438  WBC 6.5  < > 7.0 7.2 7.2 8.8 8.9  NEUTROABS 5.6  --   --   --   --   --   --   HGB 10.6*  < > 7.8* 9.4* 9.1* 8.7* 8.0*  HCT 32.0*  < > 23.4* 27.6* 27.9* 25.2* 24.2*  MCV 83.1  < > 82.1 81.9 82.5 82.1 81.8  PLT 141*  < > 108* 101* 95* 119* 147*  < > = values in this interval not displayed. CBG:  Recent Labs Lab 07/06/2015 1744 06/20/15 0438 06/20/15 0537 06/20/15 2001  GLUCAP 90 40* 100* 92    Recent Results (from the past 240 hour(s))  C difficile quick scan w PCR reflex     Status: None   Collection Time: 06/20/15  8:40 AM  Result Value Ref Range Status   C Diff antigen NEGATIVE NEGATIVE Final   C Diff toxin NEGATIVE NEGATIVE Final   C Diff interpretation Negative for toxigenic C. difficile  Final  MRSA PCR Screening     Status: None   Collection Time: 06/21/15 11:51 AM  Result Value Ref Range Status   MRSA by PCR NEGATIVE NEGATIVE Final    Scheduled Meds: . cinacalcet  60 mg Oral Q breakfast  . ciprofloxacin  400 mg Intravenous Q24H  . darbepoetin (ARANESP) injection - DIALYSIS  60 mcg Intravenous Q Fri-HD  . feeding supplement (NEPRO CARB STEADY)  237 mL Oral BID BM  .  heparin  5,000 Units Subcutaneous 3 times per day  . lanthanum  1,000 mg Oral BID WC  . metronidazole  500 mg Intravenous Q8H  . senna-docusate  1 tablet Oral BID   Continuous Infusions: . dextrose 5 % and 0.9% NaCl 125 mL/hr at 06/26/15 [id number]

## 2015-06-26 NOTE — Progress Notes (Signed)
Nutrition Follow-up  DOCUMENTATION CODES:   Severe malnutrition in context of chronic illness, Underweight  INTERVENTION:  Discontinue Nepro Shake.  Provide Boost Breeze po TID, each supplement provides 250 kcal and 9 grams of protein.  RD to continue to monitor.  NUTRITION DIAGNOSIS:   Malnutrition related to chronic illness as evidenced by severe depletion of body fat, severe depletion of muscle mass; ongoing  GOAL:   Patient will meet greater than or equal to 90% of their needs; progressing  MONITOR:   Diet advancement, Supplement acceptance, Weight trends, Labs, I & O's, Skin  REASON FOR ASSESSMENT:   Consult Assessment of nutrition requirement/status  ASSESSMENT:   80 y.o. male with a history of ESRD on HD (MWF), HTN, Gout who presents to the ED with complaints of ABD pain and Diarrhea x 2 days. He denies any Fever or Chills. He was evaluated in the ED and A CT scn of the ABD was performed and revealed Enteritis changes.  Pt with adynamic ileus. NGT clamped. Pt is now on a clear liquid diet. Pt with 100% intake this AM. He reports abdominal pain has improved. RD to order Boost Breeze to aid in caloric and protein needs. Nepro Shake will be discontinued. RD to reorder once diet advanced.   Labs and medications reviewed.   Diet Order:  Diet clear liquid Room service appropriate?: Yes; Fluid consistency:: Thin  Skin:   (R arm amputation)  Last BM:  3/21  Height:   Ht Readings from Last 1 Encounters:  06/20/15 5\' 9"  (1.753 m)    Weight:   Wt Readings from Last 1 Encounters:  06/25/15 110 lb 10.7 oz (50.2 kg)    Ideal Body Weight:  67.9 kg (adjusted for R arm amputation)  BMI:  Body mass index is 16.34 kg/(m^2).  Estimated Nutritional Needs:   Kcal:  1700-1900  Protein:  75-85 grams  Fluid:  Per MD  EDUCATION NEEDS:   No education needs identified at this time  Corrin Parker, MS, RD, LDN Pager # [phone] After hours/ weekend pager #  [phone]

## 2015-06-26 NOTE — Progress Notes (Signed)
[facility] Progress Note  Assessment/Plan: 1.  Ruptured appendix/ abd ileus: getting IV abx, NG drainage.  2.  Hypokalemia - replace 3.  ESRD - MWF HD.  HD tomorrow.  4.  HTN - BP meds on hold, bp's wnl 5. Anemia -HGB 8.7 downward trend. Aranesp 60 3/17. Follow HGB 6. MBD - holding meds given NPO 7. Nutrition - npo now 8. Volume- 2kg over dry wt, will decrease IVF"s for now  Kelly Splinter MD [place] pager [phone]    cell [phone] 06/26/2015, 3:59 PM   [facility] M[facility]  3h 46min   48kg   2/2 [place]  Prof 4  LFA AVG  Hep none Mircera 75 ug q2, recently started   Subjective:  No new complaints  Objective Filed Vitals:   06/25/15 1750 06/25/15 2025 06/26/15 1045 06/26/15 1500  BP: 119/54 119/65 127/46 123/59  Pulse: 94 84 73 85  Temp: 97.4 F (36.3 C) 98.2 F (36.8 C) 98.4 F (36.9 C) 99.9 F (37.7 C)  TempSrc: Oral Axillary Oral Oral  Resp: 24 21 23 22   Height:      Weight: 50.6 kg (111 lb 8.8 oz) 50.2 kg (110 lb 10.7 oz)    SpO2: 100% 100% 93% 94%   Physical Exam General:Thin chronically ill appearing male in NAD Heart: S1, S2, II/VI systolic M Lungs: Bilateral breath sounds CTA.  Abdomen: soft, guarding-still tender LL and RL quadrants. No BS. NG patent with bile green drainage.   Extremities: PIV in L foot. No LLE edema Dialysis Access: left AVG + bruit    Additional Objective Labs: Basic Metabolic Panel:  Recent Labs Lab 06/23/15 0625 06/24/15 0524 06/25/15 0536 06/26/15 0411  NA 136 138 140 140  K 5.0 4.4 4.3 3.3*  CL 97* 99* 101 102  CO2 27 24 26 29   GLUCOSE 89 76 130* 125*  BUN 21* 36* 46* 15  CREATININE 4.02* 5.84* 7.25* 4.18*  CALCIUM 8.3* 8.5* 8.2* 8.1*  PHOS 2.3*  --  4.0 3.0   Liver Function Tests:  Recent Labs Lab 06/23/15 0625 06/25/15 0536 06/26/15 0411  ALBUMIN 2.1* 1.8* 1.8*   No results for input(s): LIPASE, AMYLASE in the last 168 hours. CBC:  Recent Labs Lab 06/22/15 0755  06/23/15 0625 06/24/15 0524 06/25/15 0536 06/26/15 0438  WBC 7.0 7.2 7.2 8.8 8.9  HGB 7.8* 9.4* 9.1* 8.7* 8.0*  HCT 23.4* 27.6* 27.9* 25.2* 24.2*  MCV 82.1 81.9 82.5 82.1 81.8  PLT 108* 101* 95* 119* 147*   Blood Culture    Component Value Date/Time   SDES BLOOD RIGHT FOOT 12/06/2014 1602   SPECREQUEST BOTTLES DRAWN AEROBIC ONLY  5CC 12/06/2014 1602   CULT NO GROWTH 5 DAYS 12/06/2014 1602   REPTSTATUS 12/11/2014 FINAL 12/06/2014 1602    Cardiac Enzymes: No results for input(s): CKTOTAL, CKMB, CKMBINDEX, TROPONINI in the last 168 hours. CBG:  Recent Labs Lab 07/06/2015 1744 06/20/15 0438 06/20/15 0537 06/20/15 2001  GLUCAP 90 40* 100* 92   Iron Studies: No results for input(s): IRON, TIBC, TRANSFERRIN, FERRITIN in the last 72 hours. @lablastinr3 @ Studies/Results: Dg Abd Portable 1v  06/25/2015  CLINICAL DATA:  Small bowel obstruction. EXAM: PORTABLE ABDOMEN - 1 VIEW COMPARISON:  CT abdomen and pelvis 10/07/2007.  Abdomen 06/23/2015. FINDINGS: Enteric tube with tip in the left upper quadrant, likely in the upper stomach. Persistent gas distended small bowel with some decompression since previous study. Residual stool and contrast material in the colon. Contrast material in the colon  suggests that there is no complete obstruction. No radiopaque stones. Degenerative changes in the spine. IMPRESSION: Persistent gas distention of small bowel although improved since previous study. Presence of contrast material in the colon suggest no complete obstruction. Electronically Signed   By: Lucienne Capers M.D.   On: 06/25/2015 01:39   Medications: . dextrose 5 % and 0.9% NaCl 75 mL/hr at 06/26/15 1103   . antiseptic oral rinse  7 mL Mouth Rinse q12n4p  . chlorhexidine  15 mL Mouth Rinse BID  . cinacalcet  60 mg Oral Q breakfast  . ciprofloxacin  400 mg Intravenous Q24H  . darbepoetin (ARANESP) injection - DIALYSIS  60 mcg Intravenous Q Fri-HD  . feeding supplement  1 Container Oral  TID BM  . heparin  5,000 Units Subcutaneous 3 times per day  . lanthanum  1,000 mg Oral BID WC  . metronidazole  500 mg Intravenous Q8H  . senna-docusate  1 tablet Oral BID

## 2015-06-27 ENCOUNTER — Inpatient Hospital Stay (HOSPITAL_COMMUNITY): Payer: Medicare Other

## 2015-06-27 DIAGNOSIS — M25472 Effusion, left ankle: Secondary | ICD-10-CM | POA: Diagnosis not present

## 2015-06-27 DIAGNOSIS — M25572 Pain in left ankle and joints of left foot: Secondary | ICD-10-CM

## 2015-06-27 LAB — RENAL FUNCTION PANEL
ANION GAP: 13 (ref 5–15)
Albumin: 1.8 g/dL — ABNORMAL LOW (ref 3.5–5.0)
BUN: 23 mg/dL — AB (ref 6–20)
CALCIUM: 8.6 mg/dL — AB (ref 8.9–10.3)
CO2: 23 mmol/L (ref 22–32)
CREATININE: 5.77 mg/dL — AB (ref 0.61–1.24)
Chloride: 101 mmol/L (ref 101–111)
GFR calc Af Amer: 10 mL/min — ABNORMAL LOW (ref >60)
GFR calc non Af Amer: 8 mL/min — ABNORMAL LOW (ref >60)
GLUCOSE: 117 mg/dL — AB (ref 65–99)
PHOSPHORUS: 3.3 mg/dL (ref 2.5–4.6)
Potassium: 3.5 mmol/L (ref 3.5–5.1)
SODIUM: 137 mmol/L (ref 135–145)

## 2015-06-27 LAB — CBC
HCT: 22.6 % — ABNORMAL LOW (ref 39.0–52.0)
HEMOGLOBIN: 7.8 g/dL — AB (ref 13.0–17.0)
MCH: 28.2 pg (ref 26.0–34.0)
MCHC: 34.5 g/dL (ref 30.0–36.0)
MCV: 81.6 fL (ref 78.0–100.0)
Platelets: 199 10*3/uL (ref 150–400)
RBC: 2.77 MIL/uL — ABNORMAL LOW (ref 4.22–5.81)
RDW: 15.5 % (ref 11.5–15.5)
WBC: 8.4 10*3/uL (ref 4.0–10.5)

## 2015-06-27 MED ORDER — SODIUM CHLORIDE 0.9 % IV SOLN: 100.0000 mL | INTRAVENOUS | Status: DC | PRN

## 2015-06-27 MED ORDER — HYDROMORPHONE HCL 1 MG/ML IJ SOLN
INTRAMUSCULAR | Status: AC
Start: 2015-06-27 — End: 2015-06-27
  Filled 2015-06-27: qty 1

## 2015-06-27 MED ORDER — LIDOCAINE-PRILOCAINE 2.5-2.5 % EX CREA: 1.0000 "application " | TOPICAL_CREAM | CUTANEOUS | Status: DC | PRN

## 2015-06-27 MED ORDER — HEPARIN SODIUM (PORCINE) 1000 UNIT/ML DIALYSIS: 1000.0000 [IU] | INTRAMUSCULAR | Status: DC | PRN

## 2015-06-27 MED ORDER — LIDOCAINE HCL (PF) 1 % IJ SOLN: 5.0000 mL | INTRAMUSCULAR | Status: DC | PRN

## 2015-06-27 MED ORDER — ALTEPLASE 2 MG IJ SOLR: 2.0000 mg | Freq: Once | INTRAMUSCULAR | Status: DC | PRN

## 2015-06-27 MED ORDER — PENTAFLUOROPROP-TETRAFLUOROETH EX AERO: 1.0000 "application " | INHALATION_SPRAY | CUTANEOUS | Status: DC | PRN

## 2015-06-27 NOTE — Progress Notes (Addendum)
Patient ID: Brad [name], male   DOB: Oct 11, 1935, 80 y.o.   MRN: [id number]  [facility] PROGRESS NOTE  Brad [name] [id number] DOB: [birth year] DOA: 06/08/2015 PCP: Willene Hatchet, NP   Brief narrative:    80 y.o. male with a history of ESRD on HD (MWF), HTN, Gout who presented to the ED with ABD pain and diarrhea x 2 days.   In ED and A CT scan of the ABD revealed ? Enteritis, pt placed on IV Cipro and Flagyl and TRH asked to admit for further evaluation.   Major events since admission: 3/18 - vomiting, NGT placed 3/19 - worsening SBO on abd imaging, surgery consulted  3/21 - clamp NGT, clear liquid diet allowed if pt able to tolerate   Assessment/Plan:    Principal Problem:   RLQ abd pain / Adynamic ileus, appendicitis with rupture  - slight improvement, still with NGT, advanced diet to full liquids per surgery  - if pt develops nausea or vomiting, back to suction  - continue flagyl and Cipro  - no plan for surgical intervention at this time   Active Problems   ? Cholelithiasis - per GI, no plan further interventions at this time until acute issues resolved     Hyperkalemia - WNL  - BMP in AM    ESRD   - MWF at [facility] - appreciate nephrology team following   Hypertension, essential  - off antihypertensives at this time as SBP in 90;s   Anemia of chronic disease/Thrombocytopenia - Hg overall stable, Plt count improving and WNL this AM - no sings of bleeding  - CBC In AM    Leukocytosis - CT chest with no signs of PNA, likely due to appendicitis  - WBC WNL, continue current ABX - CBC in AM    Underweight, Severe PCM - Body mass index is 15.85 kg/(m^2) - nutritionist consulted, appreciate recommendations     Left ankle swelling and TTP - XRAY left ankle   DVT prophylaxis - Heparin SQ  Code Status: Full.  Family Communication:  plan of care discussed with the patient Disposition Plan: Home when cleared by surgery team  IV access:   Left AVG  Procedures and diagnostic studies:    Ct Abdomen Pelvis Wo Contrast 06/18/2015  Moderately distended small bowel throughout the abdomen and pelvis, fluid-filled throughout, most prominent distension is of the upper abdominal small bowel and stomach. 2. Thickening of the walls of the distal small bowel (terminal ileum) is presumed to be partially obstructive given the dilatation of the more proximal small bowel and the relative decompression of the large bowel. This bowel wall thickening likely represents an enteritis of infectious, inflammatory or ischemic etiology, with associated inflammation and fluid stranding in the adjacent right lower quadrant mesentery. Recommend follow-up plain film to ensure the eventual passage of the oral contrast from the small bowel due to the large bowel thereby excluding a complete obstruction. 3. Additional chronic/incidental findings detailed above.  Dg Abd 1 View 06/22/2015   Improving small bowel obstruction.  Ct Chest Wo Contrast 06/21/2015  Mild emphysematous changes. 2. Bibasilar atelectasis. 3. Ectatic ascending aorta. Recommend annual imaging followup by CTA or MRA.   Dg Abd 1 View 06/22/2015  Improving small bowel obstruction.   Dg Abd Portable 1v 06/24/2015  Enteric tube noted ending overlying the body of the stomach. 2. Diffuse distention of small bowel loops up to 7.0 cm, concerning for worsening high-grade small bowel obstruction. No free intra-abdominal air seen.  Dg Abd Portable 1v 06/23/2015  Persistent small bowel obstruction.   Ct Abdomen Pelvis W Contrast 06/24/2015  Acute appendicitis with proximal appendiceal rupture and small periappendiceal phlegmon. 2. Mildly dilated proximal small bowel loops without discrete small bowel caliber transition, favor adynamic ileus. 3. Small layering bilateral pleural effusions, increased bilaterally. 4. Mild intrahepatic and extrahepatic biliary ductal dilatation, slightly increased. Questionable 4 mm  hyperdense filling defect in the lower third of the common bile duct, cannot exclude choledocholithiasis. Nonspecific gallbladder distention and new diffuse gallbladder wall thickening. No radiopaque gallstones in the gallbladder. Recommend correlation with serum bilirubin levels. Further evaluation with MRI abdomen with MRCP as clinically warranted. 5. Severe erosive changes at the L4-5 disc space, not appreciably changed back to 12/05/2014, favor chronic discitis osteomyelitis.   Dg Abd Portable 1v 06/25/2015  Persistent gas distention of small bowel although improved since previous study. Presence of contrast material in the colon suggest no complete obstruction   Medical Consultants:  Nephrology  GI Surgery   Other Consultants:  None  IAnti-Infectives:   Cipro 3/14 --> Flagyl 3/14 -->  Faye Ramsay, MD  [facility] Pager [phone]  If 7PM-7AM, please contact night-coverage [email or web address] Password [facility] 06/27/2015, 10:19 AM   LOS: 8 days   HPI/Subjective: No events overnight. Vomiting 3/18, NGT placed 3/18, reports feeling better.   Objective: Filed Vitals:   06/27/15 0800 06/27/15 0830 06/27/15 0900 06/27/15 0930  BP: 111/56 103/57 111/53 95/52  Pulse: 95 94 94 93  Temp:      TempSrc:      Resp:      Height:      Weight:      SpO2:        Intake/Output Summary (Last 24 hours) at 06/27/15 1019 Last data filed at 06/27/15 0700  Gross per 24 hour  Intake    780 ml  Output    150 ml  Net    630 ml    Exam:   General:  Pt is alert, follows commands appropriately, not in acute distress  Cardiovascular: Regular rhythm, no rubs, no gallops  Respiratory: Clear to auscultation bilaterally, no wheezing, diminished breath sounds at bases   Abdomen: Soft, tender in lower abd quadrants, non distended, NGT in place  Extremities: left ankle swelling, no erythema, mild TTP  Data Reviewed: Basic Metabolic Panel:  Recent Labs Lab 06/22/15 0755 06/23/15 0625  06/24/15 0524 06/25/15 0536 06/26/15 0411 06/27/15 0713  NA 139 136 138 140 140 137  K 3.5 5.0 4.4 4.3 3.3* 3.5  CL 98* 97* 99* 101 102 101  CO2 26 27 24 26 29 23   GLUCOSE 84 89 76 130* 125* 117*  BUN 25* 21* 36* 46* 15 23*  CREATININE 3.56* 4.02* 5.84* 7.25* 4.18* 5.77*  CALCIUM 8.4* 8.3* 8.5* 8.2* 8.1* 8.6*  PHOS 2.5 2.3*  --  4.0 3.0 3.3   Liver Function Tests:  Recent Labs Lab 06/22/15 0755 06/23/15 0625 06/25/15 0536 06/26/15 0411 06/27/15 0713  ALBUMIN 2.1* 2.1* 1.8* 1.8* 1.8*   CBC:  Recent Labs Lab 06/23/15 0625 06/24/15 0524 06/25/15 0536 06/26/15 0438 06/27/15 0712  WBC 7.2 7.2 8.8 8.9 8.4  HGB 9.4* 9.1* 8.7* 8.0* 7.8*  HCT 27.6* 27.9* 25.2* 24.2* 22.6*  MCV 81.9 82.5 82.1 81.8 81.6  PLT 101* 95* 119* 147* 199   CBG:  Recent Labs Lab 06/20/15 2001  GLUCAP 92    Recent Results (from the past 240 hour(s))  C difficile quick scan w PCR reflex  Status: None   Collection Time: 06/20/15  8:40 AM  Result Value Ref Range Status   C Diff antigen NEGATIVE NEGATIVE Final   C Diff toxin NEGATIVE NEGATIVE Final   C Diff interpretation Negative for toxigenic C. difficile  Final  MRSA PCR Screening     Status: None   Collection Time: 06/21/15 11:51 AM  Result Value Ref Range Status   MRSA by PCR NEGATIVE NEGATIVE Final    Scheduled Meds: . cinacalcet  60 mg Oral Q breakfast  . ciprofloxacin  400 mg Intravenous Q24H  . darbepoetin (ARANESP) injection - DIALYSIS  60 mcg Intravenous Q Fri-HD  . feeding supplement (NEPRO CARB STEADY)  237 mL Oral BID BM  . heparin  5,000 Units Subcutaneous 3 times per day  . lanthanum  1,000 mg Oral BID WC  . metronidazole  500 mg Intravenous Q8H  . senna-docusate  1 tablet Oral BID   Continuous Infusions: . dextrose 5 % and 0.9% NaCl 30 mL/hr at 06/26/15 1606

## 2015-06-27 NOTE — Progress Notes (Signed)
Patient ID: Brad [name], male   DOB: 07-Apr-1936, 81 y.o.   MRN: [id number]     [facility]      [address] [place], [place] [address]    Phone: [phone] FAX: [phone]     Subjective: Sore.  Unchanged.  Afebrile.  VSS.  WBC remains normal.   Objective:  Vital signs:  Filed Vitals:   06/27/15 0800 06/27/15 0830 06/27/15 0900 06/27/15 0930  BP: 111/56 103/57 111/53 95/52  Pulse: 95 94 94 93  Temp:      TempSrc:      Resp:      Height:      Weight:      SpO2:        Last BM Date: 06/26/15  Intake/Output   Yesterday:  03/21 0701 - 03/22 0700 In: 1020 [P.O.:480; I.V.:240; IV Piggyback:300] Out: 150 [Emesis/NG output:150] This shift:    Physical Exam: General: Pt awake/alert/oriented x4 in no acute distress  Abdomen: Soft.  Nondistended.  Mld ttp rlq.   No evidence of peritonitis.  No incarcerated hernias.    Problem List:   Principal Problem:   Abdominal pain, RLQ Active Problems:   Gout, unspecified   HYPERTENSION, BENIGN SYSTEMIC   ESRD on dialysis ([facility])   Anemia of chronic renal failure, stage 5 (HCC)   Protein-calorie malnutrition, severe (HCC)   Enteritis   Acute appendicitis   Thrombocytopenia (HCC)   Underweight    Results:   Labs: Results for orders placed or performed during the hospital encounter of 06/18/2015 (from the past 48 hour(s))  Renal function panel     Status: Abnormal   Collection Time: 06/26/15  4:11 AM  Result Value Ref Range   Sodium 140 135 - 145 mmol/L   Potassium 3.3 (L) 3.5 - 5.1 mmol/L    Comment: DELTA CHECK NOTED   Chloride 102 101 - 111 mmol/L   CO2 29 22 - 32 mmol/L   Glucose, Bld 125 (H) 65 - 99 mg/dL   BUN 15 6 - 20 mg/dL   Creatinine, Ser 4.18 (H) 0.61 - 1.24 mg/dL    Comment: DELTA CHECK NOTED   Calcium 8.1 (L) 8.9 - 10.3 mg/dL   Phosphorus 3.0 2.5 - 4.6 mg/dL   Albumin 1.8 (L) 3.5 - 5.0 g/dL   GFR calc non Af Amer 13 (L) >60 mL/min   GFR calc  Af Amer 14 (L) >60 mL/min    Comment: (NOTE) The eGFR has been calculated using the CKD EPI equation. This calculation has not been validated in all clinical situations. eGFR's persistently <60 mL/min signify possible Chronic Kidney Disease.    Anion gap 9 5 - 15  CBC     Status: Abnormal   Collection Time: 06/26/15  4:38 AM  Result Value Ref Range   WBC 8.9 4.0 - 10.5 K/uL   RBC 2.96 (L) 4.22 - 5.81 MIL/uL   Hemoglobin 8.0 (L) 13.0 - 17.0 g/dL   HCT 24.2 (L) 39.0 - 52.0 %   MCV 81.8 78.0 - 100.0 fL   MCH 27.0 26.0 - 34.0 pg   MCHC 33.1 30.0 - 36.0 g/dL   RDW 15.3 11.5 - 15.5 %   Platelets 147 (L) 150 - 400 K/uL  CBC     Status: Abnormal   Collection Time: 06/27/15  7:12 AM  Result Value Ref Range   WBC 8.4 4.0 - 10.5 K/uL   RBC 2.77 (L) 4.22 - 5.81  MIL/uL   Hemoglobin 7.8 (L) 13.0 - 17.0 g/dL   HCT 22.6 (L) 39.0 - 52.0 %   MCV 81.6 78.0 - 100.0 fL   MCH 28.2 26.0 - 34.0 pg   MCHC 34.5 30.0 - 36.0 g/dL   RDW 15.5 11.5 - 15.5 %   Platelets 199 150 - 400 K/uL  Renal function panel     Status: Abnormal   Collection Time: 06/27/15  7:13 AM  Result Value Ref Range   Sodium 137 135 - 145 mmol/L   Potassium 3.5 3.5 - 5.1 mmol/L   Chloride 101 101 - 111 mmol/L   CO2 23 22 - 32 mmol/L   Glucose, Bld 117 (H) 65 - 99 mg/dL   BUN 23 (H) 6 - 20 mg/dL   Creatinine, Ser 5.77 (H) 0.61 - 1.24 mg/dL   Calcium 8.6 (L) 8.9 - 10.3 mg/dL   Phosphorus 3.3 2.5 - 4.6 mg/dL   Albumin 1.8 (L) 3.5 - 5.0 g/dL   GFR calc non Af Amer 8 (L) >60 mL/min   GFR calc Af Amer 10 (L) >60 mL/min    Comment: (NOTE) The eGFR has been calculated using the CKD EPI equation. This calculation has not been validated in all clinical situations. eGFR's persistently <60 mL/min signify possible Chronic Kidney Disease.    Anion gap 13 5 - 15    Imaging / Studies: No results found.  Medications / Allergies:  Scheduled Meds: . antiseptic oral rinse  7 mL Mouth Rinse q12n4p  . chlorhexidine  15 mL Mouth Rinse  BID  . ciprofloxacin  400 mg Intravenous Q24H  . darbepoetin (ARANESP) injection - DIALYSIS  60 mcg Intravenous Q Fri-HD  . feeding supplement  1 Container Oral TID BM  . heparin  5,000 Units Subcutaneous 3 times per day  . HYDROmorphone      . metronidazole  500 mg Intravenous Q8H  . senna-docusate  1 tablet Oral BID   Continuous Infusions: . dextrose 5 % and 0.9% NaCl 30 mL/hr at 06/26/15 1606   PRN Meds:.sodium chloride, sodium chloride, [DISCONTINUED] acetaminophen **OR** acetaminophen, alteplase, dextrose, heparin, HYDROmorphone (DILAUDID) injection, lidocaine (PF), lidocaine-prilocaine, metoprolol, [DISCONTINUED] ondansetron **OR** ondansetron (ZOFRAN) IV, pentafluoroprop-tetrafluoroeth  Antibiotics: Anti-infectives    Start     Dose/Rate Route Frequency Ordered Stop   06/20/15 2230  ciprofloxacin (CIPRO) IVPB 400 mg     400 mg 200 mL/hr over 60 Minutes Intravenous Every 24 hours 06/28/2015 2244     06/20/15 0500  metroNIDAZOLE (FLAGYL) IVPB 500 mg     500 mg 100 mL/hr over 60 Minutes Intravenous Every 8 hours 07/02/2015 2208     06/21/2015 2100  ciprofloxacin (CIPRO) IVPB 400 mg     400 mg 200 mL/hr over 60 Minutes Intravenous  Once 06/17/2015 2047 06/21/2015 2207   06/07/2015 2100  metroNIDAZOLE (FLAGYL) IVPB 500 mg     500 mg 100 mL/hr over 60 Minutes Intravenous  Once 07/02/2015 2047 07/02/2015 2207       Assessment/Plan Perforated appendicitis with phlegmon-continue with antibiotics.when tolerating POs, can transition to PO antibiotics for a total of 14 days of therapy.  OP follow up in our office 2 weeks.   Interval appendectomy in 6-8 weeks. Adynamic ileus-advance to fulls, then as tolerated   Erby Pian, ANP-BC [facility] Surgery Pager [phone](7A-4:30P) For consults and floor pages call [phone](7A-4:30P)  06/27/2015 9:53 AM

## 2015-06-27 NOTE — Progress Notes (Signed)
  [facility] Progress Note  Assessment: 1.  Ruptured appendix/ abd ileus: getting IV abx, NG drainage.  2.  Hypokalemia - replace 3.  ESRD - MWF HD.  4.  HTN - BP meds on hold, bp's wnl 5. Anemia -HGB 7.8  downward trend. Aranesp 60 3/17. Follow HGB 6. MBD - holding meds given NPO 7. Nutrition - npo now 8. Volume- 2kg over dry wt. IVF's stopped  Kelly Splinter MD [employer] pager [phone]    cell [phone] 06/27/2015, 10:46 AM   Mechele Dawley MWF  3h 44min   48kg   2/2 [place]  Prof 4  LFA AVG  Hep none Mircera 75 ug q2, recently started   Subjective:  No new complaints  Objective Filed Vitals:   06/27/15 0800 06/27/15 0830 06/27/15 0900 06/27/15 0930  BP: 111/56 103/57 111/53 95/52  Pulse: 95 94 94 93  Temp:      TempSrc:      Resp:      Height:      Weight:      SpO2:       Physical Exam General:Thin chronically ill appearing male in NAD Heart: S1, S2, II/VI systolic M Lungs: Bilateral breath sounds CTA.  Abdomen: soft, guarding-still tender LL and RL quadrants. No BS. NG patent with bile green drainage.   Extremities: PIV in L foot. No LLE edema Dialysis Access: left AVG + bruit    Additional Objective Labs: Basic Metabolic Panel:  Recent Labs Lab 06/25/15 0536 06/26/15 0411 06/27/15 0713  NA 140 140 137  K 4.3 3.3* 3.5  CL 101 102 101  CO2 26 29 23   GLUCOSE 130* 125* 117*  BUN 46* 15 23*  CREATININE 7.25* 4.18* 5.77*  CALCIUM 8.2* 8.1* 8.6*  PHOS 4.0 3.0 3.3   Liver Function Tests:  Recent Labs Lab 06/25/15 0536 06/26/15 0411 06/27/15 0713  ALBUMIN 1.8* 1.8* 1.8*   No results for input(s): LIPASE, AMYLASE in the last 168 hours. CBC:  Recent Labs Lab 06/23/15 0625 06/24/15 0524 06/25/15 0536 06/26/15 0438 06/27/15 0712  WBC 7.2 7.2 8.8 8.9 8.4  HGB 9.4* 9.1* 8.7* 8.0* 7.8*  HCT 27.6* 27.9* 25.2* 24.2* 22.6*  MCV 81.9 82.5 82.1 81.8 81.6  PLT 101* 95* 119* 147* 199   Blood Culture    Component Value  Date/Time   SDES BLOOD RIGHT FOOT 12/06/2014 1602   SPECREQUEST BOTTLES DRAWN AEROBIC ONLY  5CC 12/06/2014 1602   CULT NO GROWTH 5 DAYS 12/06/2014 1602   REPTSTATUS 12/11/2014 FINAL 12/06/2014 1602    Cardiac Enzymes: No results for input(s): CKTOTAL, CKMB, CKMBINDEX, TROPONINI in the last 168 hours. CBG:  Recent Labs Lab 06/20/15 2001  GLUCAP 92   Iron Studies: No results for input(s): IRON, TIBC, TRANSFERRIN, FERRITIN in the last 72 hours. @lablastinr3 @ Studies/Results: No results found. Medications: . dextrose 5 % and 0.9% NaCl 30 mL/hr at 06/26/15 1606   . antiseptic oral rinse  7 mL Mouth Rinse q12n4p  . chlorhexidine  15 mL Mouth Rinse BID  . ciprofloxacin  400 mg Intravenous Q24H  . darbepoetin (ARANESP) injection - DIALYSIS  60 mcg Intravenous Q Fri-HD  . feeding supplement  1 Container Oral TID BM  . heparin  5,000 Units Subcutaneous 3 times per day  . HYDROmorphone      . metronidazole  500 mg Intravenous Q8H  . senna-docusate  1 tablet Oral BID

## 2015-06-27 NOTE — Progress Notes (Signed)
This RN received patient at 1530. Noted orders to flush NGT with air and check residuals Q6H. NGT placement verified with auscultation. Flushed with 30cc air. Immediately withdrew air and attempted to check residual, but large amounts noted. Patient connected to low intermittent wall suction and immediately obtained 500cc green, mucousy gastric secretions. Physical therapist worked with patient afterwards and noted a large BM that was also green and mucous-like. Dr. Doyle Askew notified, waiting on return phone call.  Joellen Jersey, RN.

## 2015-06-27 NOTE — Progress Notes (Signed)
Physical Therapy Treatment Patient Details Name: Brad [name] MRN: [id number] DOB: 20-Jan-1936 Today's Date: 06/27/2015    History of Present Illness 80 yo male with onset of enteritis with liquid stool, has flexiseal and generalized weakness.  PMHx:  ESRD, anemia, gout, rash, R arm amputation    PT Comments    Modest progress. Practiced transfer training with a single point cane which does not appear to provide enough support (likely benefit more from hemiwalker - will attempt next visit.) Pt with bowel incontinence, preventing ambulation. Requires min assist for bed mobility today. Patient will continue to benefit from skilled physical therapy services to further improve independence with functional mobility.   Follow Up Recommendations  SNF     Equipment Recommendations  None recommended by PT    Recommendations for Other Services Rehab consult     Precautions / Restrictions Precautions Precautions: Fall (flexi removed) Restrictions Weight Bearing Restrictions: No    Mobility  Bed Mobility Overal bed mobility: Needs Assistance Bed Mobility: Supine to Sit;Sit to Supine     Supine to sit: Min assist Sit to supine: Min guard   General bed mobility comments: Min assist for LE and trucal support to achieve sitting at EOB. Min guard to return to supine, poor control with descent into bed. Cues for sequencing throughout.  Transfers Overall transfer level: Needs assistance Equipment used: Straight cane Transfers: Sit to/from Stand Sit to Stand: Mod assist Stand pivot transfers: Mod assist       General transfer comment: Mod assist for boost and balance from bed x2 and for sequencing and stability with pivot to and from [facility] with pivot. Poor upright posture despite cues. Educated on proper cane use. Bowel incontinence throughout.  Ambulation/Gait             General Gait Details: Limited by bowel incontinence   Stairs            Wheelchair Mobility    Modified Rankin (Stroke Patients Only)       Balance                                    Cognition Arousal/Alertness: Awake/alert Behavior During Therapy: WFL for tasks assessed/performed Overall Cognitive Status: Within Functional Limits for tasks assessed                      Exercises      General Comments General comments (skin integrity, edema, etc.): Brown/green watery bowel incontinence. Cleaned and linens changed.      Pertinent Vitals/Pain Pain Assessment: No/denies pain Pain Intervention(s): Monitored during session    Home Living                      Prior Function            PT Goals (current goals can now be found in the care plan section) Acute Rehab PT Goals PT Goal Formulation: With patient Time For Goal Achievement: 07/06/15 Potential to Achieve Goals: Good Progress towards PT goals: Progressing toward goals    Frequency  Min 2X/week    PT Plan Current plan remains appropriate    Co-evaluation             End of Session Equipment Utilized During Treatment: Gait belt Activity Tolerance: Patient limited by fatigue;Other (comment) (bowel incontinence) Patient left: with call bell/phone within reach;in bed;with bed alarm set  Time: [id number] PT Time Calculation (min) (ACUTE ONLY): 21 min  Charges:  $Therapeutic Activity: 8-22 mins                    G Codes:      Ellouise Newer Jul 02, 2015, 5:19 PM  Camille Bal [place], [place]

## 2015-06-28 DIAGNOSIS — L8990 Pressure ulcer of unspecified site, unspecified stage: Secondary | ICD-10-CM | POA: Insufficient documentation

## 2015-06-28 LAB — RENAL FUNCTION PANEL
Albumin: 2 g/dL — ABNORMAL LOW (ref 3.5–5.0)
Anion gap: 12 (ref 5–15)
BUN: 10 mg/dL (ref 6–20)
CALCIUM: 8.7 mg/dL — AB (ref 8.9–10.3)
CO2: 27 mmol/L (ref 22–32)
CREATININE: 3.95 mg/dL — AB (ref 0.61–1.24)
Chloride: 100 mmol/L — ABNORMAL LOW (ref 101–111)
GFR calc Af Amer: 16 mL/min — ABNORMAL LOW (ref >60)
GFR, EST NON AFRICAN AMERICAN: 13 mL/min — AB (ref >60)
GLUCOSE: 95 mg/dL (ref 65–99)
PHOSPHORUS: 2.6 mg/dL (ref 2.5–4.6)
POTASSIUM: 3.9 mmol/L (ref 3.5–5.1)
SODIUM: 139 mmol/L (ref 135–145)

## 2015-06-28 LAB — CBC
HCT: 23.9 % — ABNORMAL LOW (ref 39.0–52.0)
Hemoglobin: 7.9 g/dL — ABNORMAL LOW (ref 13.0–17.0)
MCH: 27.1 pg (ref 26.0–34.0)
MCHC: 33.1 g/dL (ref 30.0–36.0)
MCV: 81.8 fL (ref 78.0–100.0)
PLATELETS: 302 10*3/uL (ref 150–400)
RBC: 2.92 MIL/uL — ABNORMAL LOW (ref 4.22–5.81)
RDW: 15.4 % (ref 11.5–15.5)
WBC: 9.7 10*3/uL (ref 4.0–10.5)

## 2015-06-28 MED ORDER — IOHEXOL 300 MG/ML  SOLN: 25.0000 mL | INTRAMUSCULAR | Status: AC

## 2015-06-28 MED ORDER — CINACALCET HCL 30 MG PO TABS
60.0000 mg | ORAL_TABLET | Freq: Every day | ORAL | Status: DC
  Administered 2015-06-29 – 2015-07-02 (×4): 60 mg via ORAL
  Filled 2015-06-28 (×5): qty 2

## 2015-06-28 MED ORDER — RENA-VITE PO TABS
1.0000 | ORAL_TABLET | Freq: Every day | ORAL | Status: DC
  Administered 2015-06-28 – 2015-07-02 (×5): 1 via ORAL
  Filled 2015-06-28 (×6): qty 1

## 2015-06-28 MED ORDER — DARBEPOETIN ALFA 100 MCG/0.5ML IJ SOSY
100.0000 ug | PREFILLED_SYRINGE | INTRAMUSCULAR | Status: DC
  Administered 2015-06-29: 100 ug via INTRAVENOUS
  Filled 2015-06-28: qty 0.5

## 2015-06-28 MED ORDER — PRO-STAT SUGAR FREE PO LIQD
30.0000 mL | Freq: Two times a day (BID) | ORAL | Status: DC
  Administered 2015-06-28 – 2015-07-02 (×6): 30 mL via ORAL
  Filled 2015-06-28 (×9): qty 30

## 2015-06-28 NOTE — Clinical Social Work Note (Signed)
Clinical Social Work Assessment  Patient Details  Name: Brad [name] MRN: [id number] Date of Birth: 02-16-1936  Date of referral:  06/28/15               Reason for consult:  Facility Placement, Discharge Planning                Permission sought to share information with:  Facility Art therapist granted to share information::  Yes, Verbal Permission Granted  Name::     Brad [name]  Agency::     Relationship::  Spouse   Contact Information:  [phone]  Housing/Transportation Living arrangements for the past 2 months:  [place] of Information:  Patient, Spouse Patient Interpreter Needed:  None Criminal Activity/Legal Involvement Pertinent to Current Situation/Hospitalization:  No - Comment as needed Significant Relationships:  Adult Children, Spouse Lives with:  Spouse Do you feel safe going back to the place where you live?  Yes Need for family participation in patient care:  Yes (Comment)  Care giving concerns:  Not discussed    Social Worker assessment / plan:  Patient is fully alert and oriented. CSW intern visited patient in the room to discuss the patient's discharge plans. CSW explained to patient that PT were recommending that he receive some short term rehab at a SNF, and Brad [name] agreed to go. Patient stated that he had been to a facility before ([facility]) and that CSW needed to talk to his wife Brad [name])  about making a decision. Brad [name] took it upon himself to call his wife during the room visit and CSW spoke to [name] regarding facility placement for patient.Brad [name] stated that she would like for the patient to be placed at [facility] if they would accept. Patient was in full agreement with his wife. CSW explained the process for sending out the patient's information, and also provided a list of facilities. There were no further questions or concerns at this time.    Employment status:   Retired Nurse, adult PT Recommendations:  [place] / Referral to community resources:  [place]  Patient/Family's Response to care:  Not discussed   Patient/Family's Understanding of and Emotional Response to Diagnosis, Current Treatment, and Prognosis:  Not discussed   Emotional Assessment Appearance:  Appears stated age Attitude/Demeanor/Rapport:    Affect (typically observed):  Accepting, Appropriate, Calm Orientation:  Oriented to Self, Oriented to Place, Oriented to  Time, Oriented to Situation Alcohol / Substance use:  Never Used Psych involvement (Current and /or in the community):  No (Comment)  Discharge Needs  Concerns to be addressed:  Discharge Planning Concerns Readmission within the last 30 days:  No Current discharge risk:  Other (Deconditioned ) Barriers to Discharge:  No Barriers Identified   Famous Speller, Student-SW 06/28/2015, 2:50 PM

## 2015-06-28 NOTE — Care Management Important Message (Signed)
Important Message  Patient Details  Name: Brad [name] MRN: [id number] Date of Birth: 1935-11-03   Medicare Important Message Given:  Yes    [name] 06/28/2015, 1:39 PM

## 2015-06-28 NOTE — Progress Notes (Signed)
Patient ID: Brad [name], male   DOB: 01/04/1936, 80 y.o.   MRN: [id number]  [facility] PROGRESS NOTE  Brad [name] [id number] DOB: [birth year] DOA: 06/29/2015 PCP: Willene Hatchet, NP   Brief narrative:    80 y.o. male with a history of ESRD on HD (MWF), HTN, Gout who presented to the ED with ABD pain and diarrhea x 2 days.   In ED and A CT scan of the ABD revealed ? Enteritis, pt placed on IV Cipro and Flagyl and TRH asked to admit for further evaluation.   Major events since admission: 3/18 - vomiting, NGT placed 3/19 - worsening SBO on abd imaging, surgery consulted  3/21 - clamp NGT, clear liquid diet allowed if pt able to tolerate  3/22 - pull NGT out   Assessment/Plan:    Principal Problem:   RLQ abd pain / Adynamic ileus, appendicitis with rupture  - slight improvement, still with NGT, per surgery plan is to remove NGT today and see how pt does  - if pt develops nausea or vomiting, pain, will need repeat CT abd  - continue flagyl and Cipro day #10/14 - no plan for surgical intervention at this time, plan to do elective surgery ~6 weeks post discharge   Active Problems   ? Cholelithiasis - per GI, no plan further interventions at this time until acute issues resolved     Hyperkalemia - WNL  - BMP in AM    ESRD   - MWF as scheduled  - appreciate nephrology team following   Hypertension, essential  - off antihypertensives at this time as SBP in 90's  Anemia of chronic disease/Thrombocytopenia - Hg overall stable, Plt count improving and WNL this AM - no sings of bleeding  - CBC In AM    Leukocytosis - CT chest with no signs of PNA, likely due to appendicitis  - WBC WNL, continue current ABX as outlined above  - CBC in AM    Underweight, Severe PCM - Body mass index is 15.85 kg/(m^2) - nutritionist consulted, appreciate recommendations     Left ankle swelling and TTP - XRAY left ankle with swelling  - better this AM, keep ankle  supported, elevate extremity   DVT prophylaxis - Heparin SQ  Code Status: Full.  Family Communication:  plan of care discussed with the patient Disposition Plan: Home when cleared by surgery team, possibly by 3/25  IV access:  Left AVG  Procedures and diagnostic studies:    Ct Abdomen Pelvis Wo Contrast 06/28/2015  Moderately distended small bowel throughout the abdomen and pelvis, fluid-filled throughout, most prominent distension is of the upper abdominal small bowel and stomach. 2. Thickening of the walls of the distal small bowel (terminal ileum) is presumed to be partially obstructive given the dilatation of the more proximal small bowel and the relative decompression of the large bowel. This bowel wall thickening likely represents an enteritis of infectious, inflammatory or ischemic etiology, with associated inflammation and fluid stranding in the adjacent right lower quadrant mesentery. Recommend follow-up plain film to ensure the eventual passage of the oral contrast from the small bowel due to the large bowel thereby excluding a complete obstruction. 3. Additional chronic/incidental findings detailed above.  Dg Abd 1 View 06/22/2015   Improving small bowel obstruction.  Ct Chest Wo Contrast 06/21/2015  Mild emphysematous changes. 2. Bibasilar atelectasis. 3. Ectatic ascending aorta. Recommend annual imaging followup by CTA or MRA.   Dg Abd 1 View 06/22/2015  Improving  small bowel obstruction.   Dg Abd Portable 1v 06/24/2015  Enteric tube noted ending overlying the body of the stomach. 2. Diffuse distention of small bowel loops up to 7.0 cm, concerning for worsening high-grade small bowel obstruction. No free intra-abdominal air seen.  Dg Abd Portable 1v 06/23/2015  Persistent small bowel obstruction.   Ct Abdomen Pelvis W Contrast 06/24/2015  Acute appendicitis with proximal appendiceal rupture and small periappendiceal phlegmon. 2. Mildly dilated proximal small bowel loops without  discrete small bowel caliber transition, favor adynamic ileus. 3. Small layering bilateral pleural effusions, increased bilaterally. 4. Mild intrahepatic and extrahepatic biliary ductal dilatation, slightly increased. Questionable 4 mm hyperdense filling defect in the lower third of the common bile duct, cannot exclude choledocholithiasis. Nonspecific gallbladder distention and new diffuse gallbladder wall thickening. No radiopaque gallstones in the gallbladder. Recommend correlation with serum bilirubin levels. Further evaluation with MRI abdomen with MRCP as clinically warranted. 5. Severe erosive changes at the L4-5 disc space, not appreciably changed back to 12/05/2014, favor chronic discitis osteomyelitis.   Dg Abd Portable 1v 06/25/2015  Persistent gas distention of small bowel although improved since previous study. Presence of contrast material in the colon suggest no complete obstruction  Medical Consultants:  Nephrology  GI Surgery   Other Consultants:  None  IAnti-Infectives:   Cipro 3/14 --> Flagyl 3/14 -->  Faye Ramsay, MD  [facility] Pager [phone]  If 7PM-7AM, please contact night-coverage [email or web address] Password [facility] 06/28/2015, 2:38 PM   LOS: 9 days   HPI/Subjective: No events overnight. Want to have NGT out this AM.   Objective: Filed Vitals:   06/27/15 1658 06/27/15 2017 06/28/15 0522 06/28/15 1000  BP: 114/50 112/65 102/60 110/72  Pulse: 96 98 65 98  Temp: 98 F (36.7 C) 98.3 F (36.8 C) 98.3 F (36.8 C) 98 F (36.7 C)  TempSrc: Oral Oral Oral Oral  Resp: 18 19 20 18   Height:      Weight:  50 kg (110 lb 3.7 oz)    SpO2: 96% 98%  98%    Intake/Output Summary (Last 24 hours) at 06/28/15 1438 Last data filed at 06/28/15 1000  Gross per 24 hour  Intake    450 ml  Output    750 ml  Net   -300 ml    Exam:   General:  Pt is alert, follows commands appropriately, not in acute distress, cachectic   Cardiovascular: Regular rhythm, no rubs, no  gallops  Respiratory: Clear to auscultation bilaterally, no wheezing, diminished breath sounds at bases   Abdomen: Soft, tender in lower abd quadrants, non distended, NGT in place with bilious drainage   Extremities: left ankle swelling, no erythema, less TTP  Data Reviewed: Basic Metabolic Panel:  Recent Labs Lab 06/23/15 0625 06/24/15 0524 06/25/15 0536 06/26/15 0411 06/27/15 0713 06/28/15 0658  NA 136 138 140 140 137 139  K 5.0 4.4 4.3 3.3* 3.5 3.9  CL 97* 99* 101 102 101 100*  CO2 27 24 26 29 23 27   GLUCOSE 89 76 130* 125* 117* 95  BUN 21* 36* 46* 15 23* 10  CREATININE 4.02* 5.84* 7.25* 4.18* 5.77* 3.95*  CALCIUM 8.3* 8.5* 8.2* 8.1* 8.6* 8.7*  PHOS 2.3*  --  4.0 3.0 3.3 2.6   Liver Function Tests:  Recent Labs Lab 06/23/15 0625 06/25/15 0536 06/26/15 0411 06/27/15 0713 06/28/15 0658  ALBUMIN 2.1* 1.8* 1.8* 1.8* 2.0*   CBC:  Recent Labs Lab 06/24/15 0524 06/25/15 0536 06/26/15 [id number] 06/27/15 [id number] 06/28/15 [id number]  WBC 7.2 8.8 8.9 8.4 9.7  HGB 9.1* 8.7* 8.0* 7.8* 7.9*  HCT 27.9* 25.2* 24.2* 22.6* 23.9*  MCV 82.5 82.1 81.8 81.6 81.8  PLT 95* 119* 147* 199 302     Recent Results (from the past 240 hour(s))  C difficile quick scan w PCR reflex     Status: None   Collection Time: 06/20/15  8:40 AM  Result Value Ref Range Status   C Diff antigen NEGATIVE NEGATIVE Final   C Diff toxin NEGATIVE NEGATIVE Final   C Diff interpretation Negative for toxigenic C. difficile  Final  MRSA PCR Screening     Status: None   Collection Time: 06/21/15 11:51 AM  Result Value Ref Range Status   MRSA by PCR NEGATIVE NEGATIVE Final    Scheduled Meds: . cinacalcet  60 mg Oral Q breakfast  . ciprofloxacin  400 mg Intravenous Q24H  . darbepoetin (ARANESP) injection - DIALYSIS  60 mcg Intravenous Q Fri-HD  . feeding supplement (NEPRO CARB STEADY)  237 mL Oral BID BM  . heparin  5,000 Units Subcutaneous 3 times per day  . lanthanum  1,000 mg Oral BID WC  . metronidazole   500 mg Intravenous Q8H  . senna-docusate  1 tablet Oral BID   Continuous Infusions: . dextrose 5 % and 0.9% NaCl 30 mL/hr at 06/26/15 1606

## 2015-06-28 NOTE — NC FL2 (Signed)
[place] LEVEL OF CARE SCREENING TOOL     IDENTIFICATION  Patient Name: Brad [name] Birthdate: 09-19-1935 Sex: male Admission Date (Current Location): 06/08/2015  [facility] and [place] Number:  Herbalist and Address:  The . [facility], [place] [address] [place], [place] [address]      Provider Number: [id number]  Attending Physician Name and Address:  Theodis Blaze, MD  Relative Name and Phone Number:  Natthan Mclaury  [phone]) - Wife     Current Level of Care: Hospital Recommended Level of Care: [place] Prior Approval Number:    Date Approved/Denied: 06/28/15 PASRR Number:  [id number]   Discharge Plan: SNF    Current Diagnoses: Patient Active Problem List   Diagnosis Date Noted  . Pressure ulcer 06/28/2015  . Pain and swelling of left ankle 06/27/2015  . Acute appendicitis 06/26/2015  . Abdominal pain, RLQ 06/26/2015  . Thrombocytopenia ([place]) 06/26/2015  . Underweight 06/26/2015  . Enteritis 06/28/2015  . Protein-calorie malnutrition, severe ([place]) 08/15/2014  . Anemia of chronic renal failure, stage 5 (HCC) 08/14/2014  . ESRD on dialysis ([place]) 01/11/2013  . PHANTOM LIMB SYNDROME 12/16/2007  . Gout, unspecified 06/04/2006  . HYPERTENSION, BENIGN SYSTEMIC 06/04/2006    Orientation RESPIRATION BLADDER Height & Weight     Self, Time, Situation, Place  Normal Continent Weight: 110 lb 3.7 oz (50 kg) Height:  5\' 9"  (175.3 cm)  BEHAVIORAL SYMPTOMS/MOOD NEUROLOGICAL BOWEL NUTRITION STATUS      Continent Diet (Renal diet with fluid restriction: 1220mL, Fluid consistency: Thin )  AMBULATORY STATUS COMMUNICATION OF NEEDS Skin   Extensive Assist Verbally Other (Comment) (Pressure Ulcer - Deep Tissue Injury (06-28-15))                       Personal Care Assistance Level of Assistance  Bathing, Feeding, Dressing Bathing Assistance: Maximum assistance Feeding assistance: Independent (with  setup) Dressing Assistance: Maximum assistance     Functional Limitations Info  Sight, Hearing, Speech Sight Info: Adequate Hearing Info: Adequate Speech Info: Adequate    SPECIAL CARE FACTORS FREQUENCY  PT (By licensed PT), OT (By licensed OT), Speech therapy     PT Frequency: Evaluated: 06-22-15, Min 2x/week  OT Frequency: Not evaluated as of 06-28-15     Speech Therapy Frequency: Not evaluated as of 06-28-15      Contractures Contractures Info: Not present    Additional Factors Info  Code Status, Allergies Code Status Info: FULL CODE  Allergies Info: No known allergies            Current Medications (06/28/2015):  This is the current hospital active medication list Current Facility-Administered Medications  Medication Dose Route Frequency Provider Last Rate Last Dose  . acetaminophen (TYLENOL) suppository 650 mg  650 mg Rectal Q6H PRN Theressa Millard, MD      . antiseptic oral rinse (CPC / CETYLPYRIDINIUM CHLORIDE 0.05%) solution 7 mL  7 mL Mouth Rinse q12n4p Theodis Blaze, MD   7 mL at 06/28/15 1200  . chlorhexidine (PERIDEX) 0.12 % solution 15 mL  15 mL Mouth Rinse BID Theodis Blaze, MD   15 mL at 06/28/15 0950  . ciprofloxacin (CIPRO) IVPB 400 mg  400 mg Intravenous Q24H Conrad Hedley, RPH   400 mg at 06/27/15 2124  . [START ON 06/29/2015] Darbepoetin Alfa (ARANESP) injection 100 mcg  100 mcg Intravenous Q Fri-HD Valentina Gu, NP      .  dextrose 5 %-0.9 % sodium chloride infusion   Intravenous Continuous Roney Jaffe, MD 30 mL/hr at 06/26/15 1606    . dextrose 50 % solution 50 mL  1 ampule Intravenous PRN Gardiner Barefoot, NP   50 mL at 06/20/15 0440  . feeding supplement (BOOST / RESOURCE BREEZE) liquid 1 Container  1 Container Oral TID BM Theodis Blaze, MD   1 Container at 06/28/15 0950  . heparin injection 5,000 Units  5,000 Units Subcutaneous 3 times per day Theressa Millard, MD   5,000 Units at 06/28/15 1403  . HYDROmorphone (DILAUDID) injection  0.5-1 mg  0.5-1 mg Intravenous Q3H PRN Theressa Millard, MD   1 mg at 06/27/15 2122  . metoprolol (LOPRESSOR) injection 5 mg  5 mg Intravenous Q6H PRN Theodis Blaze, MD      . metroNIDAZOLE (FLAGYL) IVPB 500 mg  500 mg Intravenous Q8H Theressa Millard, MD 100 mL/hr at 06/28/15 1402 500 mg at 06/28/15 1402  . ondansetron (ZOFRAN) injection 4 mg  4 mg Intravenous Q6H PRN Theressa Millard, MD   4 mg at 06/23/15 1110     Discharge Medications: Please see discharge summary for a list of discharge medications.  Relevant Imaging Results:  Relevant Lab Results:   Additional Information SSN: [id number],    HD: MWF @ [facility], [place]

## 2015-06-28 NOTE — Progress Notes (Signed)
Dr. Jonnie Finner observed patient throwing up and not tolerating diet. Dr. Doyle Askew notified.

## 2015-06-28 NOTE — Progress Notes (Signed)
Patient ID: Brad [name], male   DOB: 1936/03/01, 80 y.o.   MRN: [id number]     [facility]      [address] [place], [place] [address]    Phone: [phone] FAX: [phone]     Subjective: No n/v. Having BMs, expected diarrhea after an ileus. Afebrile. NGT in place despite orders to remove, pt taking in full liquids.    Objective:  Vital signs:  Filed Vitals:   06/27/15 1024 06/27/15 1658 06/27/15 2017 06/28/15 0522  BP: 122/65 114/50 112/65 102/60  Pulse: 95 96 98 65  Temp: 98.2 F (36.8 C) 98 F (36.7 C) 98.3 F (36.8 C) 98.3 F (36.8 C)  TempSrc: Oral Oral Oral Oral  Resp: '18 18 19 20  ' Height:      Weight: 49.8 kg (109 lb 12.6 oz)  50 kg (110 lb 3.7 oz)   SpO2: 95% 96% 98%     Last BM Date: 06/28/15  Intake/Output   Yesterday:  03/22 0701 - 03/23 0700 In: 150 [P.O.:60; I.V.:90] Out: 750 [Emesis/NG output:500; Stool:250] This shift:    Physical Exam: General: Pt awake/alert/oriented x4 in no acute distress  Abdomen: Soft. Nondistended.non tender. No evidence of peritonitis. No incarcerated hernias.     Problem List:   Principal Problem:   Abdominal pain, RLQ Active Problems:   Gout, unspecified   HYPERTENSION, BENIGN SYSTEMIC   ESRD on dialysis ([place])   Anemia of chronic renal failure, stage 5 (HCC)   Protein-calorie malnutrition, severe (HCC)   Enteritis   Acute appendicitis   Thrombocytopenia (HCC)   Underweight   Pain and swelling of left ankle    Results:   Labs: Results for orders placed or performed during the hospital encounter of 06/12/2015 (from the past 48 hour(s))  CBC     Status: Abnormal   Collection Time: 06/27/15  7:12 AM  Result Value Ref Range   WBC 8.4 4.0 - 10.5 K/uL   RBC 2.77 (L) 4.22 - 5.81 MIL/uL   Hemoglobin 7.8 (L) 13.0 - 17.0 g/dL   HCT 22.6 (L) 39.0 - 52.0 %   MCV 81.6 78.0 - 100.0 fL   MCH 28.2 26.0 - 34.0 pg   MCHC 34.5 30.0 - 36.0 g/dL   RDW  15.5 11.5 - 15.5 %   Platelets 199 150 - 400 K/uL  Renal function panel     Status: Abnormal   Collection Time: 06/27/15  7:13 AM  Result Value Ref Range   Sodium 137 135 - 145 mmol/L   Potassium 3.5 3.5 - 5.1 mmol/L   Chloride 101 101 - 111 mmol/L   CO2 23 22 - 32 mmol/L   Glucose, Bld 117 (H) 65 - 99 mg/dL   BUN 23 (H) 6 - 20 mg/dL   Creatinine, Ser 5.77 (H) 0.61 - 1.24 mg/dL   Calcium 8.6 (L) 8.9 - 10.3 mg/dL   Phosphorus 3.3 2.5 - 4.6 mg/dL   Albumin 1.8 (L) 3.5 - 5.0 g/dL   GFR calc non Af Amer 8 (L) >60 mL/min   GFR calc Af Amer 10 (L) >60 mL/min    Comment: (NOTE) The eGFR has been calculated using the CKD EPI equation. This calculation has not been validated in all clinical situations. eGFR's persistently <60 mL/min signify possible Chronic Kidney Disease.    Anion gap 13 5 - 15  CBC     Status: Abnormal   Collection Time: 06/28/15  6:58 AM  Result Value Ref Range   WBC 9.7 4.0 - 10.5 K/uL   RBC 2.92 (L) 4.22 - 5.81 MIL/uL   Hemoglobin 7.9 (L) 13.0 - 17.0 g/dL   HCT 23.9 (L) 39.0 - 52.0 %   MCV 81.8 78.0 - 100.0 fL   MCH 27.1 26.0 - 34.0 pg   MCHC 33.1 30.0 - 36.0 g/dL   RDW 15.4 11.5 - 15.5 %   Platelets 302 150 - 400 K/uL  Renal function panel     Status: Abnormal   Collection Time: 06/28/15  6:58 AM  Result Value Ref Range   Sodium 139 135 - 145 mmol/L   Potassium 3.9 3.5 - 5.1 mmol/L   Chloride 100 (L) 101 - 111 mmol/L   CO2 27 22 - 32 mmol/L   Glucose, Bld 95 65 - 99 mg/dL   BUN 10 6 - 20 mg/dL   Creatinine, Ser 3.95 (H) 0.61 - 1.24 mg/dL   Calcium 8.7 (L) 8.9 - 10.3 mg/dL   Phosphorus 2.6 2.5 - 4.6 mg/dL   Albumin 2.0 (L) 3.5 - 5.0 g/dL   GFR calc non Af Amer 13 (L) >60 mL/min   GFR calc Af Amer 16 (L) >60 mL/min    Comment: (NOTE) The eGFR has been calculated using the CKD EPI equation. This calculation has not been validated in all clinical situations. eGFR's persistently <60 mL/min signify possible Chronic Kidney Disease.    Anion gap 12 5 -  15    Imaging / Studies: Dg Ankle Complete Left  July 10, 2015  CLINICAL DATA:  Pain and swelling in the left ankle for 4 days, no reported injury. EXAM: LEFT ANKLE COMPLETE - 3+ VIEW COMPARISON:  None. FINDINGS: There is moderate diffuse soft tissue swelling throughout the left ankle. No fracture, malalignment, suspicious focal osseous lesion or appreciable arthropathy. Vascular calcifications in the soft tissues. IMPRESSION: Moderate diffuse left ankle soft tissue swelling, with no osseous abnormality. Electronically Signed   By: Ilona Sorrel M.D.   On: Jul 10, 2015 12:53    Medications / Allergies:  Scheduled Meds: . antiseptic oral rinse  7 mL Mouth Rinse q12n4p  . chlorhexidine  15 mL Mouth Rinse BID  . ciprofloxacin  400 mg Intravenous Q24H  . darbepoetin (ARANESP) injection - DIALYSIS  60 mcg Intravenous Q Fri-HD  . feeding supplement  1 Container Oral TID BM  . heparin  5,000 Units Subcutaneous 3 times per day  . metronidazole  500 mg Intravenous Q8H  . senna-docusate  1 tablet Oral BID   Continuous Infusions: . dextrose 5 % and 0.9% NaCl 30 mL/hr at 06/26/15 1606   PRN Meds:.[DISCONTINUED] acetaminophen **OR** acetaminophen, dextrose, HYDROmorphone (DILAUDID) injection, metoprolol, [DISCONTINUED] ondansetron **OR** ondansetron (ZOFRAN) IV  Antibiotics: Anti-infectives    Start     Dose/Rate Route Frequency Ordered Stop   06/20/15 2230  ciprofloxacin (CIPRO) IVPB 400 mg     400 mg 200 mL/hr over 60 Minutes Intravenous Every 24 hours 06/30/2015 2244     06/20/15 0500  metroNIDAZOLE (FLAGYL) IVPB 500 mg     500 mg 100 mL/hr over 60 Minutes Intravenous Every 8 hours 06/22/2015 2208     06/16/2015 2100  ciprofloxacin (CIPRO) IVPB 400 mg     400 mg 200 mL/hr over 60 Minutes Intravenous  Once 06/22/2015 2047 06/16/2015 2207   06/30/2015 2100  metroNIDAZOLE (FLAGYL) IVPB 500 mg     500 mg 100 mL/hr over 60 Minutes Intravenous  Once 06/15/2015 2047 06/06/2015 2207  Assessment/Plan Perforated appendicitis with phlegmon-continue with antibiotics.when tolerating POs, can transition to PO antibiotics for a total of 14 days of therapy. OP follow up in our office 2 weeks. Interval appendectomy in 6-8 weeks. Adynamic ileus-resolved.  Advance diet   Erby Pian, [facility] Pager [phone]) For consults and floor pages call [phone](7A-4:30P)  06/28/2015  8:49 AM

## 2015-06-28 NOTE — Progress Notes (Signed)
Removed nasogastric tube per MD order. Patient tolerated well.

## 2015-06-28 NOTE — Progress Notes (Signed)
MD notified of DTI located on left nare.

## 2015-06-28 NOTE — Progress Notes (Signed)
[facility] Progress Note  Assessment/Plan: 1.  Ruptured appendix/ abd ileus:  NG tube DC'd today, cont flagyl and cipro. CCS following.  2. Hypokalemia - K+ 3.9. 3 k bath tomorrow.  3. ESRD - MWF @ [facility]. Will have HD tomorrow on schedule.  4. HTN/Volume - BP meds on hold, bp's wnl. HD yesterday. Net UF 500. Post wt 50 kg. May need EDW raised. UFG 1-1.5 tomorrow.   5. Anemia -HGB 7.9 downward trend. Aranesp 60 3/17. Follow HGB-may need transfusion. Aranesp tomorrow. Increase to 100 mcg IV q Friday 6. MBD - resume binders today.  7. Nutrition -Albumin 2.0. Renal diet. Start prostat, renal vit   Rita H. Brown NP-C 06/28/2015, 10:48 AM  [facility] [phone]  Pt seen, examined and agree w A/P as above.  Kelly Splinter MD [facility] pager [phone]    cell [phone] 06/28/2015, 2:39 PM    Subjective: "I feel a lot better". No C/O nausea/abdominal pain NG has been DC'd, drinking supplement drink-appears happy. Pressure ulcer noted on tip of L nare where NG tube holder was placed.   Objective Filed Vitals:   06/27/15 1658 06/27/15 2017 06/28/15 0522 06/28/15 1000  BP: 114/50 112/65 102/60 110/72  Pulse: 96 98 65 98  Temp: 98 F (36.7 C) 98.3 F (36.8 C) 98.3 F (36.8 C) 98 F (36.7 C)  TempSrc: Oral Oral Oral Oral  Resp: 18 19 20 18   Height:      Weight:  50 kg (110 lb 3.7 oz)    SpO2: 96% 98%  98%   Physical Exam General: Chronically ill appearing male-looks better today.  Heart: S1, S2, II/VI systolic M Lungs: Bilateral breath sounds CTA/AP.  Abdomen: soft, still slightly tender LL and RL quadrants to palpation. Hypoactive BS.  Extremities: No LLE edema Dialysis Access: left AVG + bruit  Dialysis Orders: Center: [facility] on MWF . 3 hrs 45 min, 180NRe Optiflux, BFR 400, DFR Manual 800 mL/min, EDW 48 (kg), Dialysate 2.0 K, 2.0 Ca, UFR Profile: Profile 4, Sodium Model: None, Access: LFA AV Graft Heparin: No  heparin Mircera: 75 mcg IV q 2 weeks (recently restarted)  Last in-center Lab values: HGB 10.2 Fe 115 T Sat 55% Ca 8.2 C Ca 8.4 Phos 4.9 PTH 452 Albumin 3.8   Additional Objective Labs: Basic Metabolic Panel:  Recent Labs Lab 06/26/15 0411 06/27/15 0713 06/28/15 0658  NA 140 137 139  K 3.3* 3.5 3.9  CL 102 101 100*  CO2 29 23 27   GLUCOSE 125* 117* 95  BUN 15 23* 10  CREATININE 4.18* 5.77* 3.95*  CALCIUM 8.1* 8.6* 8.7*  PHOS 3.0 3.3 2.6   Liver Function Tests:  Recent Labs Lab 06/26/15 0411 06/27/15 0713 06/28/15 0658  ALBUMIN 1.8* 1.8* 2.0*   No results for input(s): LIPASE, AMYLASE in the last 168 hours. CBC:  Recent Labs Lab 06/24/15 0524 06/25/15 0536 06/26/15 0438 06/27/15 0712 06/28/15 0658  WBC 7.2 8.8 8.9 8.4 9.7  HGB 9.1* 8.7* 8.0* 7.8* 7.9*  HCT 27.9* 25.2* 24.2* 22.6* 23.9*  MCV 82.5 82.1 81.8 81.6 81.8  PLT 95* 119* 147* 199 302   Blood Culture    Component Value Date/Time   SDES BLOOD RIGHT FOOT 12/06/2014 1602   SPECREQUEST BOTTLES DRAWN AEROBIC ONLY  5CC 12/06/2014 1602   CULT NO GROWTH 5 DAYS 12/06/2014 1602   REPTSTATUS 12/11/2014 FINAL 12/06/2014 1602    Cardiac Enzymes: No results for input(s): CKTOTAL, CKMB, CKMBINDEX, TROPONINI in the last  168 hours. CBG: No results for input(s): GLUCAP in the last 168 hours. Iron Studies: No results for input(s): IRON, TIBC, TRANSFERRIN, FERRITIN in the last 72 hours. @lablastinr3 @ Studies/Results: Dg Ankle Complete Left  06/27/2015  CLINICAL DATA:  Pain and swelling in the left ankle for 4 days, no reported injury. EXAM: LEFT ANKLE COMPLETE - 3+ VIEW COMPARISON:  None. FINDINGS: There is moderate diffuse soft tissue swelling throughout the left ankle. No fracture, malalignment, suspicious focal osseous lesion or appreciable arthropathy. Vascular calcifications in the soft tissues. IMPRESSION: Moderate diffuse left ankle soft tissue swelling, with no osseous abnormality. Electronically Signed    By: Ilona Sorrel M.D.   On: 06/27/2015 12:53   Medications: . dextrose 5 % and 0.9% NaCl 30 mL/hr at 06/26/15 1606   . antiseptic oral rinse  7 mL Mouth Rinse q12n4p  . chlorhexidine  15 mL Mouth Rinse BID  . ciprofloxacin  400 mg Intravenous Q24H  . darbepoetin (ARANESP) injection - DIALYSIS  60 mcg Intravenous Q Fri-HD  . feeding supplement  1 Container Oral TID BM  . heparin  5,000 Units Subcutaneous 3 times per day  . metronidazole  500 mg Intravenous Q8H

## 2015-06-29 ENCOUNTER — Inpatient Hospital Stay (HOSPITAL_COMMUNITY): Payer: Medicare Other

## 2015-06-29 LAB — CBC
HCT: 22.1 % — ABNORMAL LOW (ref 39.0–52.0)
HCT: 20 % — ABNORMAL LOW (ref 39.0–52.0)
Hemoglobin: 7.4 g/dL — ABNORMAL LOW (ref 13.0–17.0)
Hemoglobin: 6.8 g/dL — CL (ref 13.0–17.0)
MCH: 28 pg (ref 26.0–34.0)
MCH: 28.2 pg (ref 26.0–34.0)
MCHC: 33.5 g/dL (ref 30.0–36.0)
MCHC: 34 g/dL (ref 30.0–36.0)
MCV: 83.7 fL (ref 78.0–100.0)
MCV: 83 fL (ref 78.0–100.0)
PLATELETS: 382 10*3/uL (ref 150–400)
Platelets: 335 K/uL (ref 150–400)
RBC: 2.64 MIL/uL — AB (ref 4.22–5.81)
RBC: 2.41 MIL/uL — ABNORMAL LOW (ref 4.22–5.81)
RDW: 16.3 % — AB (ref 11.5–15.5)
RDW: 16.1 % — ABNORMAL HIGH (ref 11.5–15.5)
WBC: 9.2 10*3/uL (ref 4.0–10.5)
WBC: 8.8 10*3/uL (ref 4.0–10.5)

## 2015-06-29 LAB — RENAL FUNCTION PANEL
Albumin: 2 g/dL — ABNORMAL LOW (ref 3.5–5.0)
Albumin: 1.9 g/dL — ABNORMAL LOW (ref 3.5–5.0)
Anion gap: 10 (ref 5–15)
Anion gap: 9 (ref 5–15)
BUN: 23 mg/dL — AB (ref 6–20)
BUN: 23 mg/dL — ABNORMAL HIGH (ref 6–20)
CALCIUM: 9.1 mg/dL (ref 8.9–10.3)
CHLORIDE: 103 mmol/L (ref 101–111)
CO2: 25 mmol/L (ref 22–32)
CO2: 24 mmol/L (ref 22–32)
CREATININE: 5.64 mg/dL — AB (ref 0.61–1.24)
Calcium: 8.9 mg/dL (ref 8.9–10.3)
Chloride: 104 mmol/L (ref 101–111)
Creatinine, Ser: 5.66 mg/dL — ABNORMAL HIGH (ref 0.61–1.24)
GFR calc Af Amer: 10 mL/min — ABNORMAL LOW (ref >60)
GFR calc non Af Amer: 9 mL/min — ABNORMAL LOW (ref >60)
GFR, EST AFRICAN AMERICAN: 10 mL/min — AB (ref >60)
GFR, EST NON AFRICAN AMERICAN: 9 mL/min — AB (ref >60)
Glucose, Bld: 110 mg/dL — ABNORMAL HIGH (ref 65–99)
Glucose, Bld: 110 mg/dL — ABNORMAL HIGH (ref 65–99)
Phosphorus: 2.5 mg/dL (ref 2.5–4.6)
Phosphorus: 2.5 mg/dL (ref 2.5–4.6)
Potassium: 4 mmol/L (ref 3.5–5.1)
Potassium: 3.8 mmol/L (ref 3.5–5.1)
SODIUM: 138 mmol/L (ref 135–145)
Sodium: 137 mmol/L (ref 135–145)

## 2015-06-29 LAB — GLUCOSE, CAPILLARY: GLUCOSE-CAPILLARY: 109 mg/dL — AB (ref 65–99)

## 2015-06-29 LAB — PREPARE RBC (CROSSMATCH)

## 2015-06-29 MED ORDER — SODIUM CHLORIDE 0.9 % IV SOLN: 100.0000 mL | INTRAVENOUS | Status: DC | PRN

## 2015-06-29 MED ORDER — HYDROMORPHONE HCL 1 MG/ML IJ SOLN
INTRAMUSCULAR | Status: AC
  Filled 2015-06-29: qty 1

## 2015-06-29 MED ORDER — PENTAFLUOROPROP-TETRAFLUOROETH EX AERO: 1.0000 "application " | INHALATION_SPRAY | CUTANEOUS | Status: DC | PRN

## 2015-06-29 MED ORDER — SODIUM CHLORIDE 0.9 % IV SOLN: Freq: Once | INTRAVENOUS | Status: DC

## 2015-06-29 MED ORDER — LIDOCAINE-PRILOCAINE 2.5-2.5 % EX CREA: 1.0000 "application " | TOPICAL_CREAM | CUTANEOUS | Status: DC | PRN

## 2015-06-29 MED ORDER — IOHEXOL 300 MG/ML  SOLN
25.0000 mL | INTRAMUSCULAR | Status: AC
  Administered 2015-06-29 (×2): 25 mL via ORAL

## 2015-06-29 MED ORDER — LIDOCAINE HCL (PF) 1 % IJ SOLN: 5.0000 mL | INTRAMUSCULAR | Status: DC | PRN

## 2015-06-29 MED ORDER — ALTEPLASE 2 MG IJ SOLR: 2.0000 mg | Freq: Once | INTRAMUSCULAR | Status: DC | PRN

## 2015-06-29 MED ORDER — DARBEPOETIN ALFA 100 MCG/0.5ML IJ SOSY
PREFILLED_SYRINGE | INTRAMUSCULAR | Status: AC
  Filled 2015-06-29: qty 0.5

## 2015-06-29 MED ORDER — HEPARIN SODIUM (PORCINE) 1000 UNIT/ML DIALYSIS: 1000.0000 [IU] | INTRAMUSCULAR | Status: DC | PRN

## 2015-06-29 NOTE — Progress Notes (Signed)
[facility] PROGRESS NOTE  Brad [name] [id number] DOB: [birth year] DOA: 06/24/2015 PCP: Willene Hatchet, NP  Assessment/Plan: 1. Ileus 2. Rupture appendicitis 3.Cholelithiasis 4. ESRD on HD 5. HTN 6. Anemia of chronic disease   1. Cv. Patient developed hypotension on HD, ordered 2 units of PRBC to have during HD. On metoprolol only as needed. Will continue close monitor of blood pressure. Follow on CT abdomen and pelvis, continue on metronidazole and ciprofloxacin for now.  2, Pulmonary no signs of volume overload or pulmonary infection, will continue to monitor oxymetry, supplemental -02 per [place] to target 02 sat above 92%  3. Nephrology,. Continue HD per nephrology recommendations, caution with hypotension.  K at 3.8 and serum bicarb at 24.   4. Gastroenterology. Patient with persistent abdominal pain, will continue pain control and supportive care, NG tube has been removed, follow on ct abdomen and pelvis  5. Hematology. Anemia of chronic renal disease with critical hb and hct, transfuse 2 units on HD. Code Status: full Family Communication: plan of care discusses with patient. Disposition Plan: home.   Consultants:  Nephrology  GI  Surgery  Procedures:    Antibiotics: Ciprofloxacin and metronidazole.  HPI/Subjective: Patient had abdominal pain last night, intermittent  dull in nature, this am more persistent and associated with nausea, no vomiting,. Developed hypotension on HD. Positive diarrhea this am.   Objective: Filed Vitals:   06/29/15 0831 06/29/15 0852  BP: 106/38 86/39  Pulse: 91 103  Temp:    Resp:      Intake/Output Summary (Last 24 hours) at 06/29/15 0854 Last data filed at 06/29/15 0600  Gross per 24 hour  Intake   2080 ml  Output      0 ml  Net   2080 ml   Filed Weights   06/27/15 2017 06/28/15 2124 06/29/15 0650  Weight: 50 kg (110 lb 3.7 oz) 48.8 kg (107 lb 9.4 oz) 49.4 kg (108 lb 14.5 oz)    Exam:   General:  deconditioned and ill looking appearing  Cardiovascular: heart s1-s2, tachycardic, no gallops, rubs or murmurs  Respiratory: Lungs with clear breath sounds bilaterealy, with poor inspiratory effort.   Abdomen: soft, tender to deep palpation on the lower quadrants, no rebound or organomegally  Musculoskeletal: no deformities  Neurology. Awake and alert, very deconditioned and weak  Skin no evident rashes.  Data Reviewed: Basic Metabolic Panel:  Recent Labs Lab 06/26/15 0411 06/27/15 0713 06/28/15 0658 06/29/15 0606 06/29/15 0658  NA 140 137 139 138 137  K 3.3* 3.5 3.9 4.0 3.8  CL 102 101 100* 103 104  CO2 29 23 27 25 24   GLUCOSE 125* 117* 95 110* 110*  BUN 15 23* 10 23* 23*  CREATININE 4.18* 5.77* 3.95* 5.64* 5.66*  CALCIUM 8.1* 8.6* 8.7* 9.1 8.9  PHOS 3.0 3.3 2.6 2.5 2.5   Liver Function Tests:  Recent Labs Lab 06/26/15 0411 06/27/15 0713 06/28/15 0658 06/29/15 0606 06/29/15 0658  ALBUMIN 1.8* 1.8* 2.0* 2.0* 1.9*   No results for input(s): LIPASE, AMYLASE in the last 168 hours. No results for input(s): AMMONIA in the last 168 hours. CBC:  Recent Labs Lab 06/26/15 0438 06/27/15 0712 06/28/15 0658 06/29/15 0606 06/29/15 0658  WBC 8.9 8.4 9.7 9.2 8.8  HGB 8.0* 7.8* 7.9* 7.4* 6.8*  HCT 24.2* 22.6* 23.9* 22.1* 20.0*  MCV 81.8 81.6 81.8 83.7 83.0  PLT 147* 199 302 382 335   Cardiac Enzymes: No results for input(s): CKTOTAL, CKMB, CKMBINDEX, TROPONINI in  the last 168 hours. BNP (last 3 results) No results for input(s): BNP in the last 8760 hours.  ProBNP (last 3 results) No results for input(s): PROBNP in the last 8760 hours.  CBG:  Recent Labs Lab 06/29/15 0757  GLUCAP 109*    Recent Results (from the past 240 hour(s))  C difficile quick scan w PCR reflex     Status: None   Collection Time: 06/20/15  8:40 AM  Result Value Ref Range Status   C Diff antigen NEGATIVE NEGATIVE Final   C Diff toxin NEGATIVE NEGATIVE Final   C Diff  interpretation Negative for toxigenic C. difficile  Final  MRSA PCR Screening     Status: None   Collection Time: 06/21/15 11:51 AM  Result Value Ref Range Status   MRSA by PCR NEGATIVE NEGATIVE Final    Comment:        The GeneXpert MRSA Assay (FDA approved for NASAL specimens only), is one component of a comprehensive MRSA colonization surveillance program. It is not intended to diagnose MRSA infection nor to guide or monitor treatment for MRSA infections.      Studies: Dg Ankle Complete Left  2015-07-07  CLINICAL DATA:  Pain and swelling in the left ankle for 4 days, no reported injury. EXAM: LEFT ANKLE COMPLETE - 3+ VIEW COMPARISON:  None. FINDINGS: There is moderate diffuse soft tissue swelling throughout the left ankle. No fracture, malalignment, suspicious focal osseous lesion or appreciable arthropathy. Vascular calcifications in the soft tissues. IMPRESSION: Moderate diffuse left ankle soft tissue swelling, with no osseous abnormality. Electronically Signed   By: Ilona Sorrel M.D.   On: 2015/07/07 12:53    Scheduled Meds: . sodium chloride   Intravenous Once  . sodium chloride   Intravenous Once  . antiseptic oral rinse  7 mL Mouth Rinse q12n4p  . chlorhexidine  15 mL Mouth Rinse BID  . cinacalcet  60 mg Oral Q supper  . ciprofloxacin  400 mg Intravenous Q24H  . darbepoetin (ARANESP) injection - DIALYSIS  100 mcg Intravenous Q Fri-HD  . feeding supplement  1 Container Oral TID BM  . feeding supplement (PRO-STAT SUGAR FREE 64)  30 mL Oral BID  . heparin  5,000 Units Subcutaneous 3 times per day  . HYDROmorphone      . metronidazole  500 mg Intravenous Q8H  . multivitamin  1 tablet Oral QHS   Continuous Infusions: . dextrose 5 % and 0.9% NaCl 30 mL/hr at 06/29/15 0047    Principal Problem:   Abdominal pain, RLQ Active Problems:   Gout, unspecified   HYPERTENSION, BENIGN SYSTEMIC   ESRD on dialysis ([place])   Anemia of chronic renal failure, stage 5 (HCC)    Protein-calorie malnutrition, severe (HCC)   Enteritis   Acute appendicitis   Thrombocytopenia (HCC)   Underweight   Pain and swelling of left ankle   Pressure ulcer     Brad [name]  [facility] Pager [phone] If 7PM-7AM, please contact night-coverage at [email or web address], password [facility] 06/29/2015, 8:54 AM  LOS: 10 days

## 2015-06-29 NOTE — Progress Notes (Signed)
Hemodialysis: Unable to meet UF goal due to BP issues and pain.MD aware.

## 2015-06-29 NOTE — Progress Notes (Signed)
[facility] Progress Note  Assessment/Plan: 1.  Ruptured appendix/ abd ileus:  NG out, was feeling better yest but vomited when trying to eat. Today more abd pain.  Gen surg following  2. ESRD - MWF.  HD today.  3. HTN/Volume - BP meds on hold, bp's wnl. HD yesterday. Net UF 500. Post wt 50 kg. May need EDW raised. UFG 1-1.5 tomorrow.   5. Anemia -HGB downward trend. Aranesp 100 ug today, 2u prbc 6. MBD - resume binders today.  7. Nutrition -Albumin 2.0. Renal diet. Start prostat, renal vit   Kelly Splinter MD [facility] pager [phone]    cell [phone] 06/29/2015, 9:08 AM    Subjective:  " I feel horrible all over ", lots of abd pain  Objective Filed Vitals:   06/29/15 0825 06/29/15 0831 06/29/15 0852 06/29/15 0902  BP: 81/39 106/38 86/39 121/82  Pulse: 109 91 103 110  Temp:      TempSrc:      Resp:      Height:      Weight:      SpO2:       Physical Exam General: Chronically ill appearing male-looks better today.  Heart: S1, S2, II/VI systolic M Lungs: Bilateral breath sounds CTA/AP.  Abdomen: soft, remains tender LL and RL quadrants to palpation. Hypoactive BS.  Extremities: No LLE edema Dialysis Access: left AVG + bruit  Dialysis Orders: Center: [facility] on MWF . 3 hrs 45 min, 180NRe Optiflux, BFR 400, DFR Manual 800 mL/min, EDW 48 (kg), Dialysate 2.0 K, 2.0 Ca, UFR Profile: Profile 4, Sodium Model: None, Access: LFA AV Graft Heparin: No heparin Mircera: 75 mcg IV q 2 weeks (recently restarted)  Last in-center Lab values: HGB 10.2 Fe 115 T Sat 55% Ca 8.2 C Ca 8.4 Phos 4.9 PTH 452 Albumin 3.8   Additional Objective Labs: Basic Metabolic Panel:  Recent Labs Lab 06/28/15 0658 06/29/15 0606 06/29/15 0658  NA 139 138 137  K 3.9 4.0 3.8  CL 100* 103 104  CO2 27 25 24   GLUCOSE 95 110* 110*  BUN 10 23* 23*  CREATININE 3.95* 5.64* 5.66*  CALCIUM 8.7* 9.1 8.9  PHOS 2.6 2.5 2.5   Liver Function Tests:  Recent Labs Lab  06/28/15 0658 06/29/15 0606 06/29/15 0658  ALBUMIN 2.0* 2.0* 1.9*   No results for input(s): LIPASE, AMYLASE in the last 168 hours. CBC:  Recent Labs Lab 06/26/15 0438 06/27/15 0712 06/28/15 0658 06/29/15 0606 06/29/15 0658  WBC 8.9 8.4 9.7 9.2 8.8  HGB 8.0* 7.8* 7.9* 7.4* 6.8*  HCT 24.2* 22.6* 23.9* 22.1* 20.0*  MCV 81.8 81.6 81.8 83.7 83.0  PLT 147* 199 302 382 335   Blood Culture    Component Value Date/Time   SDES BLOOD RIGHT FOOT 12/06/2014 1602   SPECREQUEST BOTTLES DRAWN AEROBIC ONLY  5CC 12/06/2014 1602   CULT NO GROWTH 5 DAYS 12/06/2014 1602   REPTSTATUS 12/11/2014 FINAL 12/06/2014 1602    Cardiac Enzymes: No results for input(s): CKTOTAL, CKMB, CKMBINDEX, TROPONINI in the last 168 hours. CBG:  Recent Labs Lab 06/29/15 0757  GLUCAP 109*   Iron Studies: No results for input(s): IRON, TIBC, TRANSFERRIN, FERRITIN in the last 72 hours. @lablastinr3 @ Studies/Results: Dg Ankle Complete Left  06/27/2015  CLINICAL DATA:  Pain and swelling in the left ankle for 4 days, no reported injury. EXAM: LEFT ANKLE COMPLETE - 3+ VIEW COMPARISON:  None. FINDINGS: There is moderate diffuse soft tissue swelling throughout the left ankle. No  fracture, malalignment, suspicious focal osseous lesion or appreciable arthropathy. Vascular calcifications in the soft tissues. IMPRESSION: Moderate diffuse left ankle soft tissue swelling, with no osseous abnormality. Electronically Signed   By: Ilona Sorrel M.D.   On: 06/27/2015 12:53   Medications: . dextrose 5 % and 0.9% NaCl 30 mL/hr at 06/29/15 0047   . sodium chloride   Intravenous Once  . sodium chloride   Intravenous Once  . antiseptic oral rinse  7 mL Mouth Rinse q12n4p  . chlorhexidine  15 mL Mouth Rinse BID  . cinacalcet  60 mg Oral Q supper  . ciprofloxacin  400 mg Intravenous Q24H  . Darbepoetin Alfa      . darbepoetin (ARANESP) injection - DIALYSIS  100 mcg Intravenous Q Fri-HD  . feeding supplement  1 Container Oral TID  BM  . feeding supplement (PRO-STAT SUGAR FREE 64)  30 mL Oral BID  . heparin  5,000 Units Subcutaneous 3 times per day  . HYDROmorphone      . metronidazole  500 mg Intravenous Q8H  . multivitamin  1 tablet Oral QHS

## 2015-06-29 NOTE — Progress Notes (Signed)
Patient only has one arm which  his dialysis shunt is located.  Patient does not have venous access to receive IV contrast.  Unable to inject in the patient's foot.

## 2015-06-29 NOTE — Progress Notes (Signed)
[facility] Progress Note     Subjective: Pt feels awful.  Some nausea and vomited yesterday.  Pt currently in HD and he's hypotensive, pending getting blood.  Has RLQ abdominal pain.  Currently NPO.  Pending CT scan.  Objective: Vital signs in last 24 hours: Temp:  [98 F (36.7 C)-99.2 F (37.3 C)] 98.7 F (37.1 C) (03/24 0650) Pulse Rate:  [69-115] 110 (03/24 0902) Resp:  [18-26] 26 (03/24 0753) BP: (80-138)/(33-82) 121/82 mmHg (03/24 0902) SpO2:  [98 %-100 %] 100 % (03/24 0650) Weight:  [48.8 kg (107 lb 9.4 oz)-49.4 kg (108 lb 14.5 oz)] 49.4 kg (108 lb 14.5 oz) (03/24 0650) Last BM Date: 06/28/15  Intake/Output from previous day: 03/23 0701 - 03/24 0700 In: 2080 [P.O.:360; I.V.:720; IV Piggyback:1000] Out: 0  Intake/Output this shift:    PE: Gen:  Alert, NAD, pleasant Card:  RRR, no M/G/R heard Pulm:  CTA, no W/R/R, good effort Abd: Soft, mild distension, tender in RLQ, no rebound or guarding, +BS, no HSM  Lab Results:   Recent Labs  06/29/15 0606 06/29/15 0658  WBC 9.2 8.8  HGB 7.4* 6.8*  HCT 22.1* 20.0*  PLT 382 335   BMET  Recent Labs  06/29/15 0606 06/29/15 0658  NA 138 137  K 4.0 3.8  CL 103 104  CO2 25 24  GLUCOSE 110* 110*  BUN 23* 23*  CREATININE 5.64* 5.66*  CALCIUM 9.1 8.9   PT/INR No results for input(s): LABPROT, INR in the last 72 hours. CMP     Component Value Date/Time   NA 137 06/29/2015 0658   K 3.8 06/29/2015 0658   CL 104 06/29/2015 0658   CO2 24 06/29/2015 0658   GLUCOSE 110* 06/29/2015 0658   BUN 23* 06/29/2015 0658   CREATININE 5.66* 06/29/2015 0658   CREATININE 5.38* 03/06/2015 1441   CALCIUM 8.9 06/29/2015 0658   PROT 7.2 06/27/2015 1400   ALBUMIN 1.9* 06/29/2015 0658   AST 47* 06/21/2015 1400   ALT 24 07/04/2015 1400   ALKPHOS 105 06/06/2015 1400   BILITOT 1.1 06/15/2015 1400   GFRNONAA 9* 06/29/2015 0658   GFRAA 10* 06/29/2015 0658   Lipase     Component Value Date/Time   LIPASE 18  06/09/2015 1400       Studies/Results: Dg Ankle Complete Left  06/27/2015  CLINICAL DATA:  Pain and swelling in the left ankle for 4 days, no reported injury. EXAM: LEFT ANKLE COMPLETE - 3+ VIEW COMPARISON:  None. FINDINGS: There is moderate diffuse soft tissue swelling throughout the left ankle. No fracture, malalignment, suspicious focal osseous lesion or appreciable arthropathy. Vascular calcifications in the soft tissues. IMPRESSION: Moderate diffuse left ankle soft tissue swelling, with no osseous abnormality. Electronically Signed   By: Ilona Sorrel M.D.   On: 06/27/2015 12:53    Anti-infectives: Anti-infectives    Start     Dose/Rate Route Frequency Ordered Stop   06/20/15 2230  ciprofloxacin (CIPRO) IVPB 400 mg     400 mg 200 mL/hr over 60 Minutes Intravenous Every 24 hours 06/24/2015 2244     06/20/15 0500  metroNIDAZOLE (FLAGYL) IVPB 500 mg     500 mg 100 mL/hr over 60 Minutes Intravenous Every 8 hours 06/15/2015 2208     07/05/2015 2100  ciprofloxacin (CIPRO) IVPB 400 mg     400 mg 200 mL/hr over 60 Minutes Intravenous  Once 06/15/2015 2047 06/17/2015 2207   07/05/2015 2100  metroNIDAZOLE (FLAGYL) IVPB 500 mg  500 mg 100 mL/hr over 60 Minutes Intravenous  Once 06/25/2015 2047 07/02/2015 2207       Assessment/Plan Perforated appendicitis with phlegmon-continue with IV antibiotics.Repeat CT scan today (last CT 3/19).  If there is a formed abscess we can drain, will ask IR to eval.  When tolerating POs, can transition to PO antibiotics for a total of 14 days of therapy. Long term plan, OP follow up in our office 2 weeks. Interval appendectomy in 6-8 weeks. Adynamic ileus-Vomited yesterday PCM-Recommend starting TPN, he is very frail and will likely not handle NPO much longer, but will leave this up to Primary and Renal since he's a HD pt and will likely need central line Disp-Await repeat CT scan WITH contrast     LOS: 10 days    Nat Christen 06/29/2015, 9:28 AM Pager:  [phone]  (7am - 4:30pm M-F; 7am - 11:30am Sa/Su)

## 2015-06-29 NOTE — Progress Notes (Signed)
PT Cancellation Note  Patient Details Name: Brad [name] MRN: [id number] DOB: 1935-05-02   Cancelled Treatment:     will hold off this morning.  Bp 80/36, HgB 6.8 orders to receive 2 units of blood.  Will check back this afternoon as schedule permits.    Rica Koyanagi  PTA [facility]  Acute  Rehab Pager      [phone]

## 2015-06-29 NOTE — Progress Notes (Signed)
PT Cancellation Note  Patient Details Name: Brad [name] MRN: [id number] DOB: 05/24/1935   Cancelled Treatment:     unable to participate today.  Awaiting ABD CT as well as hypotensive.   Rica Koyanagi  PTA [facility]  Acute  Rehab Pager      [phone]

## 2015-06-30 DIAGNOSIS — N185 Chronic kidney disease, stage 5: Secondary | ICD-10-CM

## 2015-06-30 DIAGNOSIS — D631 Anemia in chronic kidney disease: Secondary | ICD-10-CM

## 2015-06-30 DIAGNOSIS — I10 Essential (primary) hypertension: Secondary | ICD-10-CM

## 2015-06-30 DIAGNOSIS — K3589 Other acute appendicitis: Secondary | ICD-10-CM

## 2015-06-30 LAB — TYPE AND SCREEN
ABO/RH(D): AB POS
ANTIBODY SCREEN: NEGATIVE
Unit division: 0
Unit division: 0

## 2015-06-30 NOTE — Progress Notes (Signed)
[facility] Progress Note     Subjective: Pt feels much better today.  No N/V, much less abdominal pain.  Having flatus, last BM 2 days ago.  Feels less bloated.    Objective: Vital signs in last 24 hours: Temp:  [97 F (36.1 C)-99.2 F (37.3 C)] 98.2 F (36.8 C) (03/25 0441) Pulse Rate:  [88-129] 106 (03/25 0441) Resp:  [18-29] 22 (03/25 0441) BP: (81-182)/(33-88) 112/65 mmHg (03/25 0441) SpO2:  [90 %-100 %] 90 % (03/24 1825) Weight:  [51.6 kg (113 lb 12.1 oz)] 51.6 kg (113 lb 12.1 oz) (03/24 1037) Last BM Date: 06/28/15  Intake/Output from previous day: 03/24 0701 - 03/25 0700 In: -  Out: -[id number] [Stool:1] Intake/Output this shift:    PE: Gen:  Alert, NAD, pleasant Abd: Soft, ND, mildly tender in RLQ, +BS, no HSM Ext:  No erythema, edema, or tenderness (IV in right foot)  Lab Results:   Recent Labs  06/29/15 0606 06/29/15 0658  WBC 9.2 8.8  HGB 7.4* 6.8*  HCT 22.1* 20.0*  PLT 382 335   BMET  Recent Labs  06/29/15 0606 06/29/15 0658  NA 138 137  K 4.0 3.8  CL 103 104  CO2 25 24  GLUCOSE 110* 110*  BUN 23* 23*  CREATININE 5.64* 5.66*  CALCIUM 9.1 8.9   PT/INR No results for input(s): LABPROT, INR in the last 72 hours. CMP     Component Value Date/Time   NA 137 06/29/2015 0658   K 3.8 06/29/2015 0658   CL 104 06/29/2015 0658   CO2 24 06/29/2015 0658   GLUCOSE 110* 06/29/2015 0658   BUN 23* 06/29/2015 0658   CREATININE 5.66* 06/29/2015 0658   CREATININE 5.38* 03/06/2015 1441   CALCIUM 8.9 06/29/2015 0658   PROT 7.2 06/28/2015 1400   ALBUMIN 1.9* 06/29/2015 0658   AST 47* 06/12/2015 1400   ALT 24 07/04/2015 1400   ALKPHOS 105 06/10/2015 1400   BILITOT 1.1 06/06/2015 1400   GFRNONAA 9* 06/29/2015 0658   GFRAA 10* 06/29/2015 0658   Lipase     Component Value Date/Time   LIPASE 18 07/02/2015 1400       Studies/Results: Ct Abdomen Pelvis Wo Contrast  06/29/2015  CLINICAL DATA:  Followup.  Patient had a perforated  appendicitis. EXAM: CT ABDOMEN AND PELVIS WITHOUT CONTRAST TECHNIQUE: Multidetector CT imaging of the abdomen and pelvis was performed following the standard protocol without IV contrast. COMPARISON:  06/24/2015. FINDINGS: Gastrointestinal: Appendix remains dilated now all measuring a maximum of 11 mm. Fluid collection along the appendiceal base is less defined than on the prior study. It appears smaller. There are no new periappendiceal fluid collections. There is no extraluminal air. There is dilation of the small bowel with the distal small bowel adjacent to the inflamed appendix being decompressed. Portions of the colon are also dilated. As noted previously, this is likely due to diffuse adynamic ileus. Partial small bowel obstruction at the level of the inflamed appendix, however, is possible. No bowel wall thickening. No other inflammatory changes. Lung bases: Small effusions. Dependent lung base atelectasis. Heart normal in size. Dense coronary artery calcifications. Hepatobiliary: Focus of calcification in the posterior aspect of the left liver lobe, lateral segment, stable. No other liver lesion. Mild thickening of the gallbladder wall similar to the prior exam. No gallstones. No bile duct dilation. Spleen, pancreas, adrenal glands:  Unremarkable. Kidneys, ureters, bladder: Significant bilateral renal cortical thinning. Multiple low-density renal masses consistent with cysts. No hydronephrosis. Ureters normal  course and in caliber. Bladder is mostly decompressed but otherwise unremarkable. Lymph nodes:  No adenopathy. IMPRESSION: 1. The appendix is less dilated common now 11 mm in diameter, previously 13.5 mm. The periappendiceal collection at the appendiceal base is smaller and less well-defined. There are no new fluid collections. There is no extraluminal air. 2. There is still small bowel dilation with a maximum diameter of 4.2 cm. This may reflect an adynamic ileus, but there is decompressed small bowel  distally adjacent to the inflamed appendix. Partial small bowel obstruction should be considered. 3. Mild wall thickening of the gallbladder which is most likely reactive to the right lower quadrant inflammatory process. 4. Small pleural effusions and lung base atelectasis similar to the prior study. 5. Chronic renal disease with numerous bilateral renal cysts, unchanged. Electronically Signed   By: Lajean Manes M.D.   On: 06/29/2015 19:44    Anti-infectives: Anti-infectives    Start     Dose/Rate Route Frequency Ordered Stop   06/20/15 2230  ciprofloxacin (CIPRO) IVPB 400 mg     400 mg 200 mL/hr over 60 Minutes Intravenous Every 24 hours 06/26/2015 2244     06/20/15 0500  metroNIDAZOLE (FLAGYL) IVPB 500 mg     500 mg 100 mL/hr over 60 Minutes Intravenous Every 8 hours 06/30/2015 2208     06/15/2015 2100  ciprofloxacin (CIPRO) IVPB 400 mg     400 mg 200 mL/hr over 60 Minutes Intravenous  Once 06/12/2015 2047 06/18/2015 2207   06/18/2015 2100  metroNIDAZOLE (FLAGYL) IVPB 500 mg     500 mg 100 mL/hr over 60 Minutes Intravenous  Once 06/13/2015 2047 06/21/2015 2207       Assessment/Plan Perforated appendicitis with phlegmon-continue with IV antibiotics.Repeat CT scan (06/29/15) showed smaller appendix dilatation, and smaller phlegmon, ileus pattern. Clinically improved today.  When tolerating POs, can transition to PO antibiotics for a total of 14 days of therapy. Long term plan, OP follow up in our office 2 weeks. Interval appendectomy in 6-8 weeks. Adynamic ileus-improving PCM-Recommend starting TPN, he is very frail, but will leave this up to Primary and Renal since he's a HD pt and will likely need central line Disp-Await repeat CT scan WITH contrast     LOS: 11 days    Nat Christen 06/30/2015, 8:03 AM Pager: [phone]  (7am - 4:30pm M-F; 7am - 11:30am Sa/Su)

## 2015-06-30 NOTE — Progress Notes (Signed)
PATIENT DETAILS Name: Brad [name] Age: 80 y.o. Sex: male Date of Birth: May 24, 1935 Admit Date: 06/11/2015 Admitting Physician Theressa Millard, MD KM:[id number] [name], NP  Brief narrative: 80 year old with a history of an stage renal disease on dialysis medicated with abdominal pain. Initial CT abdomen on 3/14 was suggestive of enteritis, however patient continued to have abdominal pain and developed ileus. Repeat CT abdomen on 3/19 showed acute appendicitis with proximal rupture and small periappendiceal phlegmon. Subsequently started on empiric antibiotics, general surgery consulted and provided supportive care. Slowly improving.  Subjective: Feels better, significantly less abdominal pain today.  Assessment/Plan: Principal Problem: Perforated appendicitis with phlegmon: Slowly improving, continue IV Cipro/Flagyl. Repeat CT abdomen on 3/24 was some improvement. Gen. surgery consulted, started on clear liquids. Abdomen is soft, very minimal tenderness on right lower quadrant.  Active Problems: ESRD: On hemodialysis MWF. Nephrology following.  Anemia: No overt blood loss, likely anemia secondary to acute illness on top of chronic disease. Transfused 2 units of PRBC on 3/24, await repeat CBC today. Aranesp per nephrology.  Hypertension: Continue to hold all antihypertensives, as patient developed hypotension post dialysis. Blood pressure stable. May need Midodrine hemodialysis days if this trend continues.  History of lumbar discitis: CT abdomen on 3/19 showed severe erosive changes at L4-L5-radiology, this is not appreciably changed back to 12/05/14-per radiology likely favored to have chronic discitis/osteomyelitis. I suspect this reflects residual changes from his most recent infection.  Protein-calorie malnutrition, severe: Continue supplements  Pain and swelling of left ankle: Seems to have resolved/stable. Supportive care.  Ectatic ascending aorta:  Seen incidentally on CT chest on 3/16, recommendations are to pursue annual imaging follow-up CTA. Will defer to outpatient MDs.  Disposition: Remain inpatient  Antimicrobial agents  See below  Anti-infectives    Start     Dose/Rate Route Frequency Ordered Stop   06/20/15 2230  ciprofloxacin (CIPRO) IVPB 400 mg     400 mg 200 mL/hr over 60 Minutes Intravenous Every 24 hours 06/11/2015 2244     06/20/15 0500  metroNIDAZOLE (FLAGYL) IVPB 500 mg     500 mg 100 mL/hr over 60 Minutes Intravenous Every 8 hours 06/20/2015 2208     06/21/2015 2100  ciprofloxacin (CIPRO) IVPB 400 mg     400 mg 200 mL/hr over 60 Minutes Intravenous  Once 06/30/2015 2047 06/15/2015 2207   06/22/2015 2100  metroNIDAZOLE (FLAGYL) IVPB 500 mg     500 mg 100 mL/hr over 60 Minutes Intravenous  Once 06/16/2015 2047 06/18/2015 2207      DVT Prophylaxis: Prophylactic Heparin   Code Status: Full code  Family Communication None at bedside  Procedures: None  CONSULTS:  GI, nephrology and general surgery  Time spent 30 minutes-Greater than 50% of this time was spent in counseling, explanation of diagnosis, planning of further management, and coordination of care.  MEDICATIONS: Scheduled Meds: . sodium chloride   Intravenous Once  . sodium chloride   Intravenous Once  . antiseptic oral rinse  7 mL Mouth Rinse q12n4p  . chlorhexidine  15 mL Mouth Rinse BID  . cinacalcet  60 mg Oral Q supper  . ciprofloxacin  400 mg Intravenous Q24H  . darbepoetin (ARANESP) injection - DIALYSIS  100 mcg Intravenous Q Fri-HD  . feeding supplement  1 Container Oral TID BM  . feeding supplement (PRO-STAT SUGAR FREE 64)  30 mL Oral BID  . heparin  5,000 Units Subcutaneous  3 times per day  . metronidazole  500 mg Intravenous Q8H  . multivitamin  1 tablet Oral QHS   Continuous Infusions: . dextrose 5 % and 0.9% NaCl 30 mL/hr at 06/29/15 0047   PRN Meds:.[DISCONTINUED] acetaminophen **OR** acetaminophen, dextrose, HYDROmorphone  (DILAUDID) injection, metoprolol, [DISCONTINUED] ondansetron **OR** ondansetron (ZOFRAN) IV    PHYSICAL EXAM: Vital signs in last 24 hours: Filed Vitals:   06/29/15 1825 06/29/15 2100 06/30/15 0441 06/30/15 1009  BP: 111/54 99/72 112/65 133/52  Pulse: 90 129 106 100  Temp: 99.2 F (37.3 C) 98.1 F (36.7 C) 98.2 F (36.8 C) 98.9 F (37.2 C)  TempSrc: Oral Oral  Oral  Resp: 18 24 22 22   Height:      Weight:      SpO2: 90%   96%    Weight change: 2.8 kg (6 lb 2.8 oz) Filed Weights   06/28/15 2124 06/29/15 0650 06/29/15 1037  Weight: 48.8 kg (107 lb 9.4 oz) 49.4 kg (108 lb 14.5 oz) 51.6 kg (113 lb 12.1 oz)   Body mass index is 16.79 kg/(m^2).   Gen Exam: Awake and alert with clear speech.  Neck: Supple, No JVD.  Chest: B/L Clear.   CVS: S1 S2 Regular, no murmurs.  Abdomen: soft, BS +, non tender, non distended.  Extremities: no edema, lower extremities warm to touch. Neurologic: Non Focal.   Skin: No Rash.   Wounds: N/A.    Intake/Output from previous day:  Intake/Output Summary (Last 24 hours) at 06/30/15 1158 Last data filed at 06/30/15 0900  Gross per 24 hour  Intake    680 ml  Output      1 ml  Net    679 ml     LAB RESULTS: CBC  Recent Labs Lab 06/26/15 0438 06/27/15 0712 06/28/15 0658 06/29/15 0606 06/29/15 0658  WBC 8.9 8.4 9.7 9.2 8.8  HGB 8.0* 7.8* 7.9* 7.4* 6.8*  HCT 24.2* 22.6* 23.9* 22.1* 20.0*  PLT 147* 199 302 382 335  MCV 81.8 81.6 81.8 83.7 83.0  MCH 27.0 28.2 27.1 28.0 28.2  MCHC 33.1 34.5 33.1 33.5 34.0  RDW 15.3 15.5 15.4 16.3* 16.1*    Chemistries   Recent Labs Lab 06/26/15 0411 06/27/15 0713 06/28/15 0658 06/29/15 0606 06/29/15 0658  NA 140 137 139 138 137  K 3.3* 3.5 3.9 4.0 3.8  CL 102 101 100* 103 104  CO2 29 23 27 25 24   GLUCOSE 125* 117* 95 110* 110*  BUN 15 23* 10 23* 23*  CREATININE 4.18* 5.77* 3.95* 5.64* 5.66*  CALCIUM 8.1* 8.6* 8.7* 9.1 8.9    CBG:  Recent Labs Lab 06/29/15 0757  GLUCAP 109*     GFR Estimated Creatinine Clearance: 8 mL/min (by C-G formula based on Cr of 5.66).  Coagulation profile No results for input(s): INR, PROTIME in the last 168 hours.  Cardiac Enzymes No results for input(s): CKMB, TROPONINI, MYOGLOBIN in the last 168 hours.  Invalid input(s): CK  Invalid input(s): POCBNP No results for input(s): DDIMER in the last 72 hours. No results for input(s): HGBA1C in the last 72 hours. No results for input(s): CHOL, HDL, LDLCALC, TRIG, CHOLHDL, LDLDIRECT in the last 72 hours. No results for input(s): TSH, T4TOTAL, T3FREE, THYROIDAB in the last 72 hours.  Invalid input(s): FREET3 No results for input(s): VITAMINB12, FOLATE, FERRITIN, TIBC, IRON, RETICCTPCT in the last 72 hours. No results for input(s): LIPASE, AMYLASE in the last 72 hours.  Urine Studies No results for input(s): UHGB,  CRYS in the last 72 hours.  Invalid input(s): UACOL, UAPR, USPG, UPH, UTP, UGL, UKET, UBIL, UNIT, UROB, ULEU, UEPI, UWBC, URBC, UBAC, CAST, UCOM, BILUA  MICROBIOLOGY: Recent Results (from the past 240 hour(s))  MRSA PCR Screening     Status: None   Collection Time: 06/21/15 11:51 AM  Result Value Ref Range Status   MRSA by PCR NEGATIVE NEGATIVE Final    Comment:        The GeneXpert MRSA Assay (FDA approved for NASAL specimens only), is one component of a comprehensive MRSA colonization surveillance program. It is not intended to diagnose MRSA infection nor to guide or monitor treatment for MRSA infections.     RADIOLOGY STUDIES/RESULTS: Ct Abdomen Pelvis Wo Contrast  06/29/2015  CLINICAL DATA:  Followup.  Patient had a perforated appendicitis. EXAM: CT ABDOMEN AND PELVIS WITHOUT CONTRAST TECHNIQUE: Multidetector CT imaging of the abdomen and pelvis was performed following the standard protocol without IV contrast. COMPARISON:  06/24/2015. FINDINGS: Gastrointestinal: Appendix remains dilated now all measuring a maximum of 11 mm. Fluid collection along the  appendiceal base is less defined than on the prior study. It appears smaller. There are no new periappendiceal fluid collections. There is no extraluminal air. There is dilation of the small bowel with the distal small bowel adjacent to the inflamed appendix being decompressed. Portions of the colon are also dilated. As noted previously, this is likely due to diffuse adynamic ileus. Partial small bowel obstruction at the level of the inflamed appendix, however, is possible. No bowel wall thickening. No other inflammatory changes. Lung bases: Small effusions. Dependent lung base atelectasis. Heart normal in size. Dense coronary artery calcifications. Hepatobiliary: Focus of calcification in the posterior aspect of the left liver lobe, lateral segment, stable. No other liver lesion. Mild thickening of the gallbladder wall similar to the prior exam. No gallstones. No bile duct dilation. Spleen, pancreas, adrenal glands:  Unremarkable. Kidneys, ureters, bladder: Significant bilateral renal cortical thinning. Multiple low-density renal masses consistent with cysts. No hydronephrosis. Ureters normal course and in caliber. Bladder is mostly decompressed but otherwise unremarkable. Lymph nodes:  No adenopathy. IMPRESSION: 1. The appendix is less dilated common now 11 mm in diameter, previously 13.5 mm. The periappendiceal collection at the appendiceal base is smaller and less well-defined. There are no new fluid collections. There is no extraluminal air. 2. There is still small bowel dilation with a maximum diameter of 4.2 cm. This may reflect an adynamic ileus, but there is decompressed small bowel distally adjacent to the inflamed appendix. Partial small bowel obstruction should be considered. 3. Mild wall thickening of the gallbladder which is most likely reactive to the right lower quadrant inflammatory process. 4. Small pleural effusions and lung base atelectasis similar to the prior study. 5. Chronic renal disease  with numerous bilateral renal cysts, unchanged. Electronically Signed   By: Lajean Manes M.D.   On: 06/29/2015 19:44   Ct Abdomen Pelvis Wo Contrast  06/09/2015  CLINICAL DATA:  Mid abdominal pain and diarrhea for several days. EXAM: CT ABDOMEN AND PELVIS WITHOUT CONTRAST TECHNIQUE: Multidetector CT imaging of the abdomen and pelvis was performed following the standard protocol without IV contrast. COMPARISON:  None. FINDINGS: Lower chest: No acute findings. Mild atelectasis and/or fibrosis at each lung base. Hepatobiliary: No mass visualized on this un-enhanced exam. Chronic benign-appearing dystrophic calcification within the left hepatic lobe. Gallbladder moderately distended but otherwise unremarkable. No bile duct dilatation. Pancreas: No mass or inflammatory process identified on this un-enhanced exam. Spleen:  Within normal limits in size. Adrenals/Urinary Tract: Numerous renal cysts bilaterally. No renal stone or hydronephrosis bilaterally. No ureteral or bladder calculi identified. Bladder is decompressed. Stomach/Bowel: Moderately distended small bowel throughout the abdomen and pelvis, fluid-filled throughout, with most prominent distension within the upper abdomen. Thickening of the walls of the distal small bowel (terminal ileum) which is presumed to be partially obstructive. Associated inflammation and fluid stranding within the right lower quadrant mesentery adjacent to the terminal ileum. Large bowel is relatively decompressed throughout. Vascular/Lymphatic: Atherosclerotic changes of the normal- caliber abdominal aorta and pelvic vasculature. No enlarged lymph nodes seen within the abdomen or pelvis. Reproductive: No mass or other significant abnormality. Other: No abscess collections seen. No free intraperitoneal air. No evidence of pneumatosis intestinalis. Musculoskeletal: Marked destructive changes centered at the L4-L5 disc space, compatible with sequela of previous discitis/osteomyelitis,  previously documented on lumbar spine MRI of 12/05/2014. No new osseous abnormality. Superficial soft tissues are unremarkable. IMPRESSION: 1. Moderately distended small bowel throughout the abdomen and pelvis, fluid-filled throughout, most prominent distension is of the upper abdominal small bowel and stomach. 2. Thickening of the walls of the distal small bowel (terminal ileum) is presumed to be partially obstructive given the dilatation of the more proximal small bowel and the relative decompression of the large bowel. This bowel wall thickening likely represents an enteritis of infectious, inflammatory or ischemic etiology, with associated inflammation and fluid stranding in the adjacent right lower quadrant mesentery. Recommend follow-up plain film to ensure the eventual passage of the oral contrast from the small bowel due to the large bowel thereby excluding a complete obstruction. 3. Additional chronic/incidental findings detailed above. Electronically Signed   By: Franki Cabot M.D.   On: 06/28/2015 20:28   Dg Ankle Complete Left  06/27/2015  CLINICAL DATA:  Pain and swelling in the left ankle for 4 days, no reported injury. EXAM: LEFT ANKLE COMPLETE - 3+ VIEW COMPARISON:  None. FINDINGS: There is moderate diffuse soft tissue swelling throughout the left ankle. No fracture, malalignment, suspicious focal osseous lesion or appreciable arthropathy. Vascular calcifications in the soft tissues. IMPRESSION: Moderate diffuse left ankle soft tissue swelling, with no osseous abnormality. Electronically Signed   By: Ilona Sorrel M.D.   On: 06/27/2015 12:53   Dg Abd 1 View  06/22/2015  CLINICAL DATA:  Lower abdominal pain and small bowel obstruction. EXAM: ABDOMEN - 1 VIEW COMPARISON:  06/19/2005 FINDINGS: Degree of small bowel dilatation appears improved with some residual dilatation remaining. There is some transit of diluted oral contrast into the colon. IMPRESSION: Improving small bowel obstruction.  Electronically Signed   By: Aletta Edouard M.D.   On: 06/22/2015 14:57   Ct Chest Wo Contrast  06/21/2015  CLINICAL DATA:  Hypoxia; pt unable to put arms up over head Hx of HTN, supraventricular tachycardia, anemia, renal disorder EXAM: CT CHEST WITHOUT CONTRAST TECHNIQUE: Multidetector CT imaging of the chest was performed following the standard protocol without IV contrast. COMPARISON:  12/07/2014 and CT of the abdomen and pelvis 06/08/2015 FINDINGS: Heart: Extensive coronary artery calcifications present. Heart is mildly enlarged. No pericardial effusion identified. Vascular structures: There is atherosclerotic calcification of the thoracic aorta. Aorta is 4.3 cm maximum diameter. There is atherosclerotic calcification of the great vessels. Pulmonary artery is normal in appearance. Mediastinum/thyroid: The visualized portion of the thyroid gland has a normal appearance. No mediastinal, hilar, or axillary adenopathy. Lungs/Airways: Scattered emphysematous changes are identified within the lungs. No focal consolidation. There is mild bibasilar atelectasis. Upper abdomen:  Distended stomach containing radiopaque debris. Small partially imaged left kidney with low-attenuation upper pole cyst. Coarse calcification identified within the posterior aspect of the left hepatic lobe. Visualized portion of the left adrenal gland is normal. Chest wall/osseous structures: Negative IMPRESSION: 1. Mild emphysematous changes. 2. Bibasilar atelectasis. 3. Ectatic ascending aorta. Recommend annual imaging followup by CTA or MRA. This recommendation follows 2010 ACCF/AHA/AATS/ACR/ASA/SCA/SCAI/SIR/STS/SVM Guidelines for the Diagnosis and Management of Patients with Thoracic Aortic Disease. Circulation. 2010; 121: [id number] 4. Distended stomach. 5. Small left kidney with renal cysts, partially imaged. 6. Coronary artery disease. Electronically Signed   By: Nolon Nations M.D.   On: 06/21/2015 13:35   Ct Abdomen Pelvis W  Contrast  06/24/2015  CLINICAL DATA:  Small bowel obstruction. Diffuse lower abdominal pain. EXAM: CT ABDOMEN AND PELVIS WITH CONTRAST TECHNIQUE: Multidetector CT imaging of the abdomen and pelvis was performed using the standard protocol following bolus administration of intravenous contrast. CONTRAST:  60mL OMNIPAQUE IOHEXOL 300 MG/ML  SOLN COMPARISON:  06/16/2015 CT abdomen/ pelvis. Abdominal radiographs from 1 day prior. FINDINGS: Lower chest: Small layering bilateral pleural effusions, left greater than right, increased bilaterally. Dependent atelectasis in both lower lobes. Coronary atherosclerosis. Hepatobiliary: Stable 2.4 cm focus of coarse calcification in the posterior lateral segment left liver lobe, probably from remote insult. No additional liver lesions. Mildly distended gallbladder with nonspecific mild-to-moderate diffuse gallbladder wall thickening. No definite pericholecystic fluid. No radiopaque cholelithiasis. There is new mild central intrahepatic biliary ductal dilatation. The common bile duct measures 7 mm diameter, slightly increased from 6 mm. There is a possible 4 mm round density in the lower third of the common bile duct (series 203/image 40), cannot exclude choledocholithiasis. Pancreas: Stable mild diffuse main pancreatic duct dilation (4 mm diameter). No pancreatic mass. No pancreatic thickening or peripancreatic fat stranding or fluid. Spleen: Normal size. No mass. Adrenals/Urinary Tract: Normal adrenals. No hydronephrosis. No renal stones. There are several simple cysts in both kidneys measuring up to 1.5 cm in the interpolar right kidney and 2.3 cm in the upper left kidney. There are innumerable additional subcentimeter hypodense renal cortical lesions in both kidneys, too small to characterize, not appreciably changed. Collapsed and grossly normal bladder. Stomach/Bowel: Enteric tube terminates in the proximal stomach. The collapsed stomach is grossly normal. The appendix is  diffusely dilated and fluid-filled measuring up to 14 mm diameter. The appendiceal wall is diffusely thickened and hyper enhancing. There is mild periappendiceal fat stranding. These findings are in keeping with acute appendicitis. There is an approximately 2 cm discontinuity in the proximal appendiceal wall with the surrounding ill-defined and a tiny focus of extraluminal gas, in keeping with appendiceal perforation and surrounding small phlegmon. There are mildly dilated small bowel loops throughout the jejunum. The distal small bowel is collapsed. No discrete small bowel caliber transition . The colon is relatively collapsed is filled with oral contrast, with no large bowel wall thickening. There is mild diverticulosis in the sigmoid colon. Rectal drainage tube is in place. Vascular/Lymphatic: Atherosclerotic nonaneurysmal abdominal aorta. Patent portal, splenic, hepatic and renal veins. No pathologically enlarged lymph nodes in the abdomen or pelvis. Reproductive: Top-normal size prostate. Other: No ascites.  No frank pneumoperitoneum . Musculoskeletal: Re- demonstrated is severe erosive and sclerotic change at the L4-5 disc space, not appreciably changed back to 12/05/2014 lumbar spine MRI, worrisome for chronic discitis osteomyelitis. IMPRESSION: 1. Acute appendicitis with proximal appendiceal rupture and small periappendiceal phlegmon. 2. Mildly dilated proximal small bowel loops without discrete small bowel caliber transition, favor  adynamic ileus. 3. Small layering bilateral pleural effusions, increased bilaterally. 4. Mild intrahepatic and extrahepatic biliary ductal dilatation, slightly increased. Questionable 4 mm hyperdense filling defect in the lower third of the common bile duct, cannot exclude choledocholithiasis. Nonspecific gallbladder distention and new diffuse gallbladder wall thickening. No radiopaque gallstones in the gallbladder. Recommend correlation with serum bilirubin levels. Further  evaluation with MRI abdomen with MRCP as clinically warranted. 5. Severe erosive changes at the L4-5 disc space, not appreciably changed back to 12/05/2014, favor chronic discitis osteomyelitis. These results were called by telephone at the time of interpretation on 06/24/2015 at 11:46 am to RN ANDY BRAKE, who verbally acknowledged these results. These results were called by telephone at the time of interpretation on 06/24/2015 at 11:50 am to Dr. Owens Loffler , who verbally acknowledged these results. Electronically Signed   By: Ilona Sorrel M.D.   On: 06/24/2015 11:52   Dg Abd 2 Views  06/20/2015  CLINICAL DATA:  Small bowel obstruction.  Abdominal pain. EXAM: ABDOMEN - 2 VIEW COMPARISON:  CT abdomen and pelvis 07/06/2015. FINDINGS: Moderate gastric and proximal small bowel distension is again noted. Densities over the left upper quadrant are likely within the stomach. IMPRESSION: Moderate proximal small bowel and gastric distention persists. Electronically Signed   By: San Morelle M.D.   On: 06/20/2015 15:55   Dg Abd Portable 1v  06/25/2015  CLINICAL DATA:  Small bowel obstruction. EXAM: PORTABLE ABDOMEN - 1 VIEW COMPARISON:  CT abdomen and pelvis 10/07/2007.  Abdomen 06/23/2015. FINDINGS: Enteric tube with tip in the left upper quadrant, likely in the upper stomach. Persistent gas distended small bowel with some decompression since previous study. Residual stool and contrast material in the colon. Contrast material in the colon suggests that there is no complete obstruction. No radiopaque stones. Degenerative changes in the spine. IMPRESSION: Persistent gas distention of small bowel although improved since previous study. Presence of contrast material in the colon suggest no complete obstruction. Electronically Signed   By: Lucienne Capers M.D.   On: 06/25/2015 01:39   Dg Abd Portable 1v  06/24/2015  CLINICAL DATA:  Nasogastric tube placement.  Initial encounter. EXAM: PORTABLE ABDOMEN - 1 VIEW  COMPARISON:  Abdominal radiograph performed earlier today at 4:32 p.m. FINDINGS: The patient's enteric tube is noted ending overlying the body of the stomach. The side port is also noted at the body of the stomach. There is diffuse distention of small-bowel loops up to 7.0 cm, concerning for worsening high-grade small bowel obstruction. No free intra-abdominal air is seen, though evaluation for free air is limited on a single supine view. No acute osseous abnormalities are identified. IMPRESSION: 1. Enteric tube noted ending overlying the body of the stomach. 2. Diffuse distention of small bowel loops up to 7.0 cm, concerning for worsening high-grade small bowel obstruction. No free intra-abdominal air seen. Electronically Signed   By: Garald Balding M.D.   On: 06/24/2015 00:47   Dg Abd Portable 1v  06/23/2015  CLINICAL DATA:  history of ESRD on HD (MWF), HTN, Gout who presented to the ED with ABD pain and diarrhea x 2 days. Evaluate SBO EXAM: PORTABLE ABDOMEN - 1 VIEW COMPARISON:  06/22/2015 FINDINGS: Persistent dilatation of central small bowel loops. Persistent particular debris within distended stomach. No evidence first free intraperitoneal air on this supine view of the abdomen. IMPRESSION: Persistent small bowel obstruction. Electronically Signed   By: Nolon Nations M.D.   On: 06/23/2015 16:52    Oren Binet, MD  [facility] Pager:[phone]  If 7PM-7AM, please contact night-coverage [email or web address] Password [email or web address] 06/30/2015, 11:58 AM   LOS: 11 days

## 2015-06-30 NOTE — Progress Notes (Signed)
[facility] Progress Note  Assessment/Plan: 1. Ruptured appendix/ abd ileus: NG out, was feeling better yest but vomited when trying to eat. Gen surg following. Cipro & Flagyl 2. ESRD - MWF. Next HD Monday. .  3. HTN/Volume - BP meds on hold, BP much lower than normal. HD yesterday, hypotensive throughout- Net UF + 882. Needs EDW raised. Next tx Monday.  4. Anemia -HGB downward trend to 6.4. Rec'd Aranesp 100 ug, 2u prbc 03/24. HGB has not been rechecked. Will order today.  5. MBD -Ca 8.9 C Ca 10.5 Phos 2.5 DC binders. Cont 2.0 Ca bath 6. Nutrition -Albumin 1.9. Has been changed to liquid diet. Started prostat, renal vit  Rita H. Brown NP-C 06/30/2015, 10:01 AM  [facility] [phone]  Pt seen, examined and agree w A/P as above. Will raise dry wt to [place] [facility] pager [phone]    cell [phone] 06/30/2015, 2:47 PM    Subjective: "I think I'm doing a little better-my stomach doesn't hurt as bad".     Objective Filed Vitals:   06/29/15 1202 06/29/15 1825 06/29/15 2100 06/30/15 0441  BP: 155/81 111/54 99/72 112/65  Pulse: 107 90 129 106  Temp: 98.3 F (36.8 C) 99.2 F (37.3 C) 98.1 F (36.7 C) 98.2 F (36.8 C)  TempSrc: Oral Oral Oral   Resp: 18 18 24 22   Height:      Weight:      SpO2: 100% 90%     General: Chronically ill appearing male-looks better today.  Heart: S1, S2, II/VI systolic M Lungs: Bilateral breath sounds CTA/AP.  Abdomen: soft, remains tender RL quadrants to palpation. Hypoactive BS.  Extremities: No LLE edema Dialysis Access: left AVG + bruit  Dialysis: [facility] MWF  3h 44min   48kg   2/2 bath  Prof 4  LFA AVG  Hep none Mircera: 75 mcg IV q 2 weeks (recently restarted)  Last in-center Lab values: HGB 10.2 Fe 115 T Sat 55% Ca 8.2 C Ca 8.4 Phos 4.9 PTH 452 Albumin 3.8  Additional Objective Labs: Basic Metabolic Panel:  Recent Labs Lab 06/28/15 0658 06/29/15 0606  06/29/15 0658  NA 139 138 137  K 3.9 4.0 3.8  CL 100* 103 104  CO2 27 25 24   GLUCOSE 95 110* 110*  BUN 10 23* 23*  CREATININE 3.95* 5.64* 5.66*  CALCIUM 8.7* 9.1 8.9  PHOS 2.6 2.5 2.5   Liver Function Tests:  Recent Labs Lab 06/28/15 0658 06/29/15 0606 06/29/15 0658  ALBUMIN 2.0* 2.0* 1.9*   No results for input(s): LIPASE, AMYLASE in the last 168 hours. CBC:  Recent Labs Lab 06/26/15 0438 06/27/15 0712 06/28/15 0658 06/29/15 0606 06/29/15 0658  WBC 8.9 8.4 9.7 9.2 8.8  HGB 8.0* 7.8* 7.9* 7.4* 6.8*  HCT 24.2* 22.6* 23.9* 22.1* 20.0*  MCV 81.8 81.6 81.8 83.7 83.0  PLT 147* 199 302 382 335   Blood Culture    Component Value Date/Time   SDES BLOOD RIGHT FOOT 12/06/2014 1602   SPECREQUEST BOTTLES DRAWN AEROBIC ONLY  5CC 12/06/2014 1602   CULT NO GROWTH 5 DAYS 12/06/2014 1602   REPTSTATUS 12/11/2014 FINAL 12/06/2014 1602    Cardiac Enzymes: No results for input(s): CKTOTAL, CKMB, CKMBINDEX, TROPONINI in the last 168 hours. CBG:  Recent Labs Lab 06/29/15 0757  GLUCAP 109*   Iron Studies: No results for input(s): IRON, TIBC, TRANSFERRIN, FERRITIN in the last 72 hours. @lablastinr3 @ Studies/Results: Ct Abdomen Pelvis Wo Contrast  06/29/2015  CLINICAL DATA:  Followup.  Patient had a perforated appendicitis. EXAM: CT ABDOMEN AND PELVIS WITHOUT CONTRAST TECHNIQUE: Multidetector CT imaging of the abdomen and pelvis was performed following the standard protocol without IV contrast. COMPARISON:  06/24/2015. FINDINGS: Gastrointestinal: Appendix remains dilated now all measuring a maximum of 11 mm. Fluid collection along the appendiceal base is less defined than on the prior study. It appears smaller. There are no new periappendiceal fluid collections. There is no extraluminal air. There is dilation of the small bowel with the distal small bowel adjacent to the inflamed appendix being decompressed. Portions of the colon are also dilated. As noted previously, this is likely  due to diffuse adynamic ileus. Partial small bowel obstruction at the level of the inflamed appendix, however, is possible. No bowel wall thickening. No other inflammatory changes. Lung bases: Small effusions. Dependent lung base atelectasis. Heart normal in size. Dense coronary artery calcifications. Hepatobiliary: Focus of calcification in the posterior aspect of the left liver lobe, lateral segment, stable. No other liver lesion. Mild thickening of the gallbladder wall similar to the prior exam. No gallstones. No bile duct dilation. Spleen, pancreas, adrenal glands:  Unremarkable. Kidneys, ureters, bladder: Significant bilateral renal cortical thinning. Multiple low-density renal masses consistent with cysts. No hydronephrosis. Ureters normal course and in caliber. Bladder is mostly decompressed but otherwise unremarkable. Lymph nodes:  No adenopathy. IMPRESSION: 1. The appendix is less dilated common now 11 mm in diameter, previously 13.5 mm. The periappendiceal collection at the appendiceal base is smaller and less well-defined. There are no new fluid collections. There is no extraluminal air. 2. There is still small bowel dilation with a maximum diameter of 4.2 cm. This may reflect an adynamic ileus, but there is decompressed small bowel distally adjacent to the inflamed appendix. Partial small bowel obstruction should be considered. 3. Mild wall thickening of the gallbladder which is most likely reactive to the right lower quadrant inflammatory process. 4. Small pleural effusions and lung base atelectasis similar to the prior study. 5. Chronic renal disease with numerous bilateral renal cysts, unchanged. Electronically Signed   By: Lajean Manes M.D.   On: 06/29/2015 19:44   Medications: . dextrose 5 % and 0.9% NaCl 30 mL/hr at 06/29/15 0047   . sodium chloride   Intravenous Once  . sodium chloride   Intravenous Once  . antiseptic oral rinse  7 mL Mouth Rinse q12n4p  . chlorhexidine  15 mL Mouth Rinse  BID  . cinacalcet  60 mg Oral Q supper  . ciprofloxacin  400 mg Intravenous Q24H  . darbepoetin (ARANESP) injection - DIALYSIS  100 mcg Intravenous Q Fri-HD  . feeding supplement  1 Container Oral TID BM  . feeding supplement (PRO-STAT SUGAR FREE 64)  30 mL Oral BID  . heparin  5,000 Units Subcutaneous 3 times per day  . metronidazole  500 mg Intravenous Q8H  . multivitamin  1 tablet Oral QHS

## 2015-07-01 DIAGNOSIS — E43 Unspecified severe protein-calorie malnutrition: Secondary | ICD-10-CM

## 2015-07-01 LAB — RENAL FUNCTION PANEL
Albumin: 2 g/dL — ABNORMAL LOW (ref 3.5–5.0)
Anion gap: 10 (ref 5–15)
BUN: 21 mg/dL — AB (ref 6–20)
CHLORIDE: 110 mmol/L (ref 101–111)
CO2: 18 mmol/L — AB (ref 22–32)
CREATININE: 4.9 mg/dL — AB (ref 0.61–1.24)
Calcium: 8.9 mg/dL (ref 8.9–10.3)
GFR calc Af Amer: 12 mL/min — ABNORMAL LOW (ref >60)
GFR calc non Af Amer: 10 mL/min — ABNORMAL LOW (ref >60)
GLUCOSE: 85 mg/dL (ref 65–99)
Phosphorus: 2.2 mg/dL — ABNORMAL LOW (ref 2.5–4.6)
Potassium: 4.4 mmol/L (ref 3.5–5.1)
Sodium: 138 mmol/L (ref 135–145)

## 2015-07-01 LAB — CBC
HCT: 34 % — ABNORMAL LOW (ref 39.0–52.0)
Hemoglobin: 11.5 g/dL — ABNORMAL LOW (ref 13.0–17.0)
MCH: 28.5 pg (ref 26.0–34.0)
MCHC: 33.8 g/dL (ref 30.0–36.0)
MCV: 84.2 fL (ref 78.0–100.0)
PLATELETS: 462 10*3/uL — AB (ref 150–400)
RBC: 4.04 MIL/uL — AB (ref 4.22–5.81)
RDW: 16.3 % — AB (ref 11.5–15.5)
WBC: 13.5 10*3/uL — AB (ref 4.0–10.5)

## 2015-07-01 NOTE — Progress Notes (Signed)
PATIENT DETAILS Name: Brad [name] Age: 80 y.o. Sex: male Date of Birth: 23-Jul-1935 Admit Date: 06/24/2015 Admitting Physician Theressa Millard, MD KM:[id number] [name], NP  Brief narrative: 80 year old with a history of an stage renal disease on dialysis presented with abdominal pain. Initial CT abdomen on 3/14 was suggestive of enteritis, however patient continued to have abdominal pain and developed ileus. Repeat CT abdomen on 3/19 showed acute appendicitis with proximal rupture and small periappendiceal phlegmon. Subsequently started on empiric antibiotics, general surgery consulted and provided supportive care. Slowly improving.  Subjective: Feels better, Very minimal right lower quadrant pain. Claims to have multiple bowel movements.  Assessment/Plan: Principal Problem: Perforated appendicitis with phlegmon: Slowly improving, continue IV Cipro/Flagyl. Repeat CT abdomen on 3/24 with some improvement. Gen. Surgery following, diet has not been upgraded to a dysphagia 1 diet. Abdomen is soft, very minimal tenderness on right lower quadrant.  Active Problems: ESRD: On hemodialysis MWF. Nephrology following.  Anemia: No overt blood loss, likely anemia secondary to acute illness on top of chronic disease. Transfused 2 units of PRBC on 3/24, Hemoglobin stable posttransfusion. Aranesp per nephrology.  Hypertension: Continue to hold all antihypertensives, as patient developed hypotension post dialysis. Blood pressure stable. May need Midodrine hemodialysis days if this trend continues.  History of lumbar discitis: CT abdomen on 3/19 showed severe erosive changes at L4-L5-radiology, this is not appreciably changed back to 12/05/14-per radiology likely favored to have chronic discitis/osteomyelitis. I suspect this reflects residual changes from his most recent infection.  Protein-calorie malnutrition, severe: Continue supplements  Pain and swelling of left ankle:  Seems to have resolved/stable. Supportive care.  Ectatic ascending aorta: Seen incidentally on CT chest on 3/16, recommendations are to pursue annual imaging follow-up CTA. Will defer to outpatient MDs.  Disposition: Remain inpatient-SNF over the next few days.  Antimicrobial agents  See below  Anti-infectives    Start     Dose/Rate Route Frequency Ordered Stop   06/20/15 2230  ciprofloxacin (CIPRO) IVPB 400 mg     400 mg 200 mL/hr over 60 Minutes Intravenous Every 24 hours 07/02/2015 2244     06/20/15 0500  metroNIDAZOLE (FLAGYL) IVPB 500 mg     500 mg 100 mL/hr over 60 Minutes Intravenous Every 8 hours 06/28/2015 2208     07/02/2015 2100  ciprofloxacin (CIPRO) IVPB 400 mg     400 mg 200 mL/hr over 60 Minutes Intravenous  Once 06/12/2015 2047 06/17/2015 2207   06/24/2015 2100  metroNIDAZOLE (FLAGYL) IVPB 500 mg     500 mg 100 mL/hr over 60 Minutes Intravenous  Once 06/16/2015 2047 06/11/2015 2207      DVT Prophylaxis: Prophylactic Heparin   Code Status: Full code  Family Communication Spouse and son at bedside  Procedures: None  CONSULTS:  GI, nephrology and general surgery  Time spent 20 minutes-Greater than 50% of this time was spent in counseling, explanation of diagnosis, planning of further management, and coordination of care.  MEDICATIONS: Scheduled Meds: . sodium chloride   Intravenous Once  . sodium chloride   Intravenous Once  . antiseptic oral rinse  7 mL Mouth Rinse q12n4p  . chlorhexidine  15 mL Mouth Rinse BID  . cinacalcet  60 mg Oral Q supper  . ciprofloxacin  400 mg Intravenous Q24H  . darbepoetin (ARANESP) injection - DIALYSIS  100 mcg Intravenous Q Fri-HD  . feeding supplement  1 Container Oral TID BM  .  feeding supplement (PRO-STAT SUGAR FREE 64)  30 mL Oral BID  . heparin  5,000 Units Subcutaneous 3 times per day  . metronidazole  500 mg Intravenous Q8H  . multivitamin  1 tablet Oral QHS   Continuous Infusions: . dextrose 5 % and 0.9% NaCl 30 mL/hr  at 06/29/15 0047   PRN Meds:.[DISCONTINUED] acetaminophen **OR** acetaminophen, dextrose, HYDROmorphone (DILAUDID) injection, metoprolol, [DISCONTINUED] ondansetron **OR** ondansetron (ZOFRAN) IV    PHYSICAL EXAM: Vital signs in last 24 hours: Filed Vitals:   06/30/15 1726 06/30/15 1957 07/01/15 0540 07/01/15 1000  BP: 138/66 125/67 128/69 125/73  Pulse: 101 104 105 107  Temp: 98.4 F (36.9 C) 98.2 F (36.8 C) 98.4 F (36.9 C) 98.2 F (36.8 C)  TempSrc: Oral Oral Oral Oral  Resp: 20 18 19 20   Height:      Weight:  51.2 kg (112 lb 14 oz)    SpO2: 96% 98% 99% 98%    Weight change: -0.4 kg (-14.1 oz) Filed Weights   06/29/15 0650 06/29/15 1037 06/30/15 1957  Weight: 49.4 kg (108 lb 14.5 oz) 51.6 kg (113 lb 12.1 oz) 51.2 kg (112 lb 14 oz)   Body mass index is 16.66 kg/(m^2).   Gen Exam: Awake and alert with clear speech.  Neck: Supple, No JVD.  Chest: B/L Clear.   CVS: S1 S2 Regular, no murmurs.  Abdomen: soft, BS +, non tender, non distended.  Extremities: no edema, lower extremities warm to touch. Neurologic: Non Focal.   Skin: No Rash.   Wounds: N/A.    Intake/Output from previous day:  Intake/Output Summary (Last 24 hours) at 07/01/15 1346 Last data filed at 07/01/15 1300  Gross per 24 hour  Intake   2260 ml  Output    751 ml  Net   1509 ml     LAB RESULTS: CBC  Recent Labs Lab 06/27/15 0712 06/28/15 0658 06/29/15 0606 06/29/15 0658 07/01/15 0523  WBC 8.4 9.7 9.2 8.8 13.5*  HGB 7.8* 7.9* 7.4* 6.8* 11.5*  HCT 22.6* 23.9* 22.1* 20.0* 34.0*  PLT 199 302 382 335 462*  MCV 81.6 81.8 83.7 83.0 84.2  MCH 28.2 27.1 28.0 28.2 28.5  MCHC 34.5 33.1 33.5 34.0 33.8  RDW 15.5 15.4 16.3* 16.1* 16.3*    Chemistries   Recent Labs Lab 06/27/15 0713 06/28/15 0658 06/29/15 0606 06/29/15 0658 07/01/15 0523  NA 137 139 138 137 138  K 3.5 3.9 4.0 3.8 4.4  CL 101 100* 103 104 110  CO2 23 27 25 24  18*  GLUCOSE 117* 95 110* 110* 85  BUN 23* 10 23* 23* 21*    CREATININE 5.77* 3.95* 5.64* 5.66* 4.90*  CALCIUM 8.6* 8.7* 9.1 8.9 8.9    CBG:  Recent Labs Lab 06/29/15 0757  GLUCAP 109*    GFR Estimated Creatinine Clearance: 9.1 mL/min (by C-G formula based on Cr of 4.9).  Coagulation profile No results for input(s): INR, PROTIME in the last 168 hours.  Cardiac Enzymes No results for input(s): CKMB, TROPONINI, MYOGLOBIN in the last 168 hours.  Invalid input(s): CK  Invalid input(s): POCBNP No results for input(s): DDIMER in the last 72 hours. No results for input(s): HGBA1C in the last 72 hours. No results for input(s): CHOL, HDL, LDLCALC, TRIG, CHOLHDL, LDLDIRECT in the last 72 hours. No results for input(s): TSH, T4TOTAL, T3FREE, THYROIDAB in the last 72 hours.  Invalid input(s): FREET3 No results for input(s): VITAMINB12, FOLATE, FERRITIN, TIBC, IRON, RETICCTPCT in the last 72 hours. No  results for input(s): LIPASE, AMYLASE in the last 72 hours.  Urine Studies No results for input(s): UHGB, CRYS in the last 72 hours.  Invalid input(s): UACOL, UAPR, USPG, UPH, UTP, UGL, UKET, UBIL, UNIT, UROB, ULEU, UEPI, UWBC, URBC, UBAC, CAST, UCOM, BILUA  MICROBIOLOGY: No results found for this or any previous visit (from the past 240 hour(s)).  RADIOLOGY STUDIES/RESULTS: Ct Abdomen Pelvis Wo Contrast  06/29/2015  CLINICAL DATA:  Followup.  Patient had a perforated appendicitis. EXAM: CT ABDOMEN AND PELVIS WITHOUT CONTRAST TECHNIQUE: Multidetector CT imaging of the abdomen and pelvis was performed following the standard protocol without IV contrast. COMPARISON:  06/24/2015. FINDINGS: Gastrointestinal: Appendix remains dilated now all measuring a maximum of 11 mm. Fluid collection along the appendiceal base is less defined than on the prior study. It appears smaller. There are no new periappendiceal fluid collections. There is no extraluminal air. There is dilation of the small bowel with the distal small bowel adjacent to the inflamed appendix  being decompressed. Portions of the colon are also dilated. As noted previously, this is likely due to diffuse adynamic ileus. Partial small bowel obstruction at the level of the inflamed appendix, however, is possible. No bowel wall thickening. No other inflammatory changes. Lung bases: Small effusions. Dependent lung base atelectasis. Heart normal in size. Dense coronary artery calcifications. Hepatobiliary: Focus of calcification in the posterior aspect of the left liver lobe, lateral segment, stable. No other liver lesion. Mild thickening of the gallbladder wall similar to the prior exam. No gallstones. No bile duct dilation. Spleen, pancreas, adrenal glands:  Unremarkable. Kidneys, ureters, bladder: Significant bilateral renal cortical thinning. Multiple low-density renal masses consistent with cysts. No hydronephrosis. Ureters normal course and in caliber. Bladder is mostly decompressed but otherwise unremarkable. Lymph nodes:  No adenopathy. IMPRESSION: 1. The appendix is less dilated common now 11 mm in diameter, previously 13.5 mm. The periappendiceal collection at the appendiceal base is smaller and less well-defined. There are no new fluid collections. There is no extraluminal air. 2. There is still small bowel dilation with a maximum diameter of 4.2 cm. This may reflect an adynamic ileus, but there is decompressed small bowel distally adjacent to the inflamed appendix. Partial small bowel obstruction should be considered. 3. Mild wall thickening of the gallbladder which is most likely reactive to the right lower quadrant inflammatory process. 4. Small pleural effusions and lung base atelectasis similar to the prior study. 5. Chronic renal disease with numerous bilateral renal cysts, unchanged. Electronically Signed   By: Lajean Manes M.D.   On: 06/29/2015 19:44   Ct Abdomen Pelvis Wo Contrast  06/08/2015  CLINICAL DATA:  Mid abdominal pain and diarrhea for several days. EXAM: CT ABDOMEN AND PELVIS  WITHOUT CONTRAST TECHNIQUE: Multidetector CT imaging of the abdomen and pelvis was performed following the standard protocol without IV contrast. COMPARISON:  None. FINDINGS: Lower chest: No acute findings. Mild atelectasis and/or fibrosis at each lung base. Hepatobiliary: No mass visualized on this un-enhanced exam. Chronic benign-appearing dystrophic calcification within the left hepatic lobe. Gallbladder moderately distended but otherwise unremarkable. No bile duct dilatation. Pancreas: No mass or inflammatory process identified on this un-enhanced exam. Spleen: Within normal limits in size. Adrenals/Urinary Tract: Numerous renal cysts bilaterally. No renal stone or hydronephrosis bilaterally. No ureteral or bladder calculi identified. Bladder is decompressed. Stomach/Bowel: Moderately distended small bowel throughout the abdomen and pelvis, fluid-filled throughout, with most prominent distension within the upper abdomen. Thickening of the walls of the distal small bowel (terminal ileum)  which is presumed to be partially obstructive. Associated inflammation and fluid stranding within the right lower quadrant mesentery adjacent to the terminal ileum. Large bowel is relatively decompressed throughout. Vascular/Lymphatic: Atherosclerotic changes of the normal- caliber abdominal aorta and pelvic vasculature. No enlarged lymph nodes seen within the abdomen or pelvis. Reproductive: No mass or other significant abnormality. Other: No abscess collections seen. No free intraperitoneal air. No evidence of pneumatosis intestinalis. Musculoskeletal: Marked destructive changes centered at the L4-L5 disc space, compatible with sequela of previous discitis/osteomyelitis, previously documented on lumbar spine MRI of 12/05/2014. No new osseous abnormality. Superficial soft tissues are unremarkable. IMPRESSION: 1. Moderately distended small bowel throughout the abdomen and pelvis, fluid-filled throughout, most prominent distension  is of the upper abdominal small bowel and stomach. 2. Thickening of the walls of the distal small bowel (terminal ileum) is presumed to be partially obstructive given the dilatation of the more proximal small bowel and the relative decompression of the large bowel. This bowel wall thickening likely represents an enteritis of infectious, inflammatory or ischemic etiology, with associated inflammation and fluid stranding in the adjacent right lower quadrant mesentery. Recommend follow-up plain film to ensure the eventual passage of the oral contrast from the small bowel due to the large bowel thereby excluding a complete obstruction. 3. Additional chronic/incidental findings detailed above. Electronically Signed   By: Franki Cabot M.D.   On: 06/30/2015 20:28   Dg Ankle Complete Left  06/27/2015  CLINICAL DATA:  Pain and swelling in the left ankle for 4 days, no reported injury. EXAM: LEFT ANKLE COMPLETE - 3+ VIEW COMPARISON:  None. FINDINGS: There is moderate diffuse soft tissue swelling throughout the left ankle. No fracture, malalignment, suspicious focal osseous lesion or appreciable arthropathy. Vascular calcifications in the soft tissues. IMPRESSION: Moderate diffuse left ankle soft tissue swelling, with no osseous abnormality. Electronically Signed   By: Ilona Sorrel M.D.   On: 06/27/2015 12:53   Dg Abd 1 View  06/22/2015  CLINICAL DATA:  Lower abdominal pain and small bowel obstruction. EXAM: ABDOMEN - 1 VIEW COMPARISON:  06/19/2005 FINDINGS: Degree of small bowel dilatation appears improved with some residual dilatation remaining. There is some transit of diluted oral contrast into the colon. IMPRESSION: Improving small bowel obstruction. Electronically Signed   By: Aletta Edouard M.D.   On: 06/22/2015 14:57   Ct Chest Wo Contrast  06/21/2015  CLINICAL DATA:  Hypoxia; pt unable to put arms up over head Hx of HTN, supraventricular tachycardia, anemia, renal disorder EXAM: CT CHEST WITHOUT CONTRAST  TECHNIQUE: Multidetector CT imaging of the chest was performed following the standard protocol without IV contrast. COMPARISON:  12/07/2014 and CT of the abdomen and pelvis 06/18/2015 FINDINGS: Heart: Extensive coronary artery calcifications present. Heart is mildly enlarged. No pericardial effusion identified. Vascular structures: There is atherosclerotic calcification of the thoracic aorta. Aorta is 4.3 cm maximum diameter. There is atherosclerotic calcification of the great vessels. Pulmonary artery is normal in appearance. Mediastinum/thyroid: The visualized portion of the thyroid gland has a normal appearance. No mediastinal, hilar, or axillary adenopathy. Lungs/Airways: Scattered emphysematous changes are identified within the lungs. No focal consolidation. There is mild bibasilar atelectasis. Upper abdomen: Distended stomach containing radiopaque debris. Small partially imaged left kidney with low-attenuation upper pole cyst. Coarse calcification identified within the posterior aspect of the left hepatic lobe. Visualized portion of the left adrenal gland is normal. Chest wall/osseous structures: Negative IMPRESSION: 1. Mild emphysematous changes. 2. Bibasilar atelectasis. 3. Ectatic ascending aorta. Recommend annual imaging followup by  CTA or MRA. This recommendation follows 2010 ACCF/AHA/AATS/ACR/ASA/SCA/SCAI/SIR/STS/SVM Guidelines for the Diagnosis and Management of Patients with Thoracic Aortic Disease. Circulation. 2010; 121: [id number] 4. Distended stomach. 5. Small left kidney with renal cysts, partially imaged. 6. Coronary artery disease. Electronically Signed   By: Nolon Nations M.D.   On: 06/21/2015 13:35   Ct Abdomen Pelvis W Contrast  06/24/2015  CLINICAL DATA:  Small bowel obstruction. Diffuse lower abdominal pain. EXAM: CT ABDOMEN AND PELVIS WITH CONTRAST TECHNIQUE: Multidetector CT imaging of the abdomen and pelvis was performed using the standard protocol following bolus administration of  intravenous contrast. CONTRAST:  51mL OMNIPAQUE IOHEXOL 300 MG/ML  SOLN COMPARISON:  07/04/2015 CT abdomen/ pelvis. Abdominal radiographs from 1 day prior. FINDINGS: Lower chest: Small layering bilateral pleural effusions, left greater than right, increased bilaterally. Dependent atelectasis in both lower lobes. Coronary atherosclerosis. Hepatobiliary: Stable 2.4 cm focus of coarse calcification in the posterior lateral segment left liver lobe, probably from remote insult. No additional liver lesions. Mildly distended gallbladder with nonspecific mild-to-moderate diffuse gallbladder wall thickening. No definite pericholecystic fluid. No radiopaque cholelithiasis. There is new mild central intrahepatic biliary ductal dilatation. The common bile duct measures 7 mm diameter, slightly increased from 6 mm. There is a possible 4 mm round density in the lower third of the common bile duct (series 203/image 40), cannot exclude choledocholithiasis. Pancreas: Stable mild diffuse main pancreatic duct dilation (4 mm diameter). No pancreatic mass. No pancreatic thickening or peripancreatic fat stranding or fluid. Spleen: Normal size. No mass. Adrenals/Urinary Tract: Normal adrenals. No hydronephrosis. No renal stones. There are several simple cysts in both kidneys measuring up to 1.5 cm in the interpolar right kidney and 2.3 cm in the upper left kidney. There are innumerable additional subcentimeter hypodense renal cortical lesions in both kidneys, too small to characterize, not appreciably changed. Collapsed and grossly normal bladder. Stomach/Bowel: Enteric tube terminates in the proximal stomach. The collapsed stomach is grossly normal. The appendix is diffusely dilated and fluid-filled measuring up to 14 mm diameter. The appendiceal wall is diffusely thickened and hyper enhancing. There is mild periappendiceal fat stranding. These findings are in keeping with acute appendicitis. There is an approximately 2 cm discontinuity  in the proximal appendiceal wall with the surrounding ill-defined and a tiny focus of extraluminal gas, in keeping with appendiceal perforation and surrounding small phlegmon. There are mildly dilated small bowel loops throughout the jejunum. The distal small bowel is collapsed. No discrete small bowel caliber transition . The colon is relatively collapsed is filled with oral contrast, with no large bowel wall thickening. There is mild diverticulosis in the sigmoid colon. Rectal drainage tube is in place. Vascular/Lymphatic: Atherosclerotic nonaneurysmal abdominal aorta. Patent portal, splenic, hepatic and renal veins. No pathologically enlarged lymph nodes in the abdomen or pelvis. Reproductive: Top-normal size prostate. Other: No ascites.  No frank pneumoperitoneum . Musculoskeletal: Re- demonstrated is severe erosive and sclerotic change at the L4-5 disc space, not appreciably changed back to 12/05/2014 lumbar spine MRI, worrisome for chronic discitis osteomyelitis. IMPRESSION: 1. Acute appendicitis with proximal appendiceal rupture and small periappendiceal phlegmon. 2. Mildly dilated proximal small bowel loops without discrete small bowel caliber transition, favor adynamic ileus. 3. Small layering bilateral pleural effusions, increased bilaterally. 4. Mild intrahepatic and extrahepatic biliary ductal dilatation, slightly increased. Questionable 4 mm hyperdense filling defect in the lower third of the common bile duct, cannot exclude choledocholithiasis. Nonspecific gallbladder distention and new diffuse gallbladder wall thickening. No radiopaque gallstones in the gallbladder. Recommend correlation with serum  bilirubin levels. Further evaluation with MRI abdomen with MRCP as clinically warranted. 5. Severe erosive changes at the L4-5 disc space, not appreciably changed back to 12/05/2014, favor chronic discitis osteomyelitis. These results were called by telephone at the time of interpretation on 06/24/2015 at  11:46 am to RN ANDY BRAKE, who verbally acknowledged these results. These results were called by telephone at the time of interpretation on 06/24/2015 at 11:50 am to Dr. Owens Loffler , who verbally acknowledged these results. Electronically Signed   By: Ilona Sorrel M.D.   On: 06/24/2015 11:52   Dg Abd 2 Views  06/20/2015  CLINICAL DATA:  Small bowel obstruction.  Abdominal pain. EXAM: ABDOMEN - 2 VIEW COMPARISON:  CT abdomen and pelvis 07/05/2015. FINDINGS: Moderate gastric and proximal small bowel distension is again noted. Densities over the left upper quadrant are likely within the stomach. IMPRESSION: Moderate proximal small bowel and gastric distention persists. Electronically Signed   By: San Morelle M.D.   On: 06/20/2015 15:55   Dg Abd Portable 1v  06/25/2015  CLINICAL DATA:  Small bowel obstruction. EXAM: PORTABLE ABDOMEN - 1 VIEW COMPARISON:  CT abdomen and pelvis 10/07/2007.  Abdomen 06/23/2015. FINDINGS: Enteric tube with tip in the left upper quadrant, likely in the upper stomach. Persistent gas distended small bowel with some decompression since previous study. Residual stool and contrast material in the colon. Contrast material in the colon suggests that there is no complete obstruction. No radiopaque stones. Degenerative changes in the spine. IMPRESSION: Persistent gas distention of small bowel although improved since previous study. Presence of contrast material in the colon suggest no complete obstruction. Electronically Signed   By: Lucienne Capers M.D.   On: 06/25/2015 01:39   Dg Abd Portable 1v  06/24/2015  CLINICAL DATA:  Nasogastric tube placement.  Initial encounter. EXAM: PORTABLE ABDOMEN - 1 VIEW COMPARISON:  Abdominal radiograph performed earlier today at 4:32 p.m. FINDINGS: The patient's enteric tube is noted ending overlying the body of the stomach. The side port is also noted at the body of the stomach. There is diffuse distention of small-bowel loops up to 7.0 cm,  concerning for worsening high-grade small bowel obstruction. No free intra-abdominal air is seen, though evaluation for free air is limited on a single supine view. No acute osseous abnormalities are identified. IMPRESSION: 1. Enteric tube noted ending overlying the body of the stomach. 2. Diffuse distention of small bowel loops up to 7.0 cm, concerning for worsening high-grade small bowel obstruction. No free intra-abdominal air seen. Electronically Signed   By: Garald Balding M.D.   On: 06/24/2015 00:47   Dg Abd Portable 1v  06/23/2015  CLINICAL DATA:  history of ESRD on HD (MWF), HTN, Gout who presented to the ED with ABD pain and diarrhea x 2 days. Evaluate SBO EXAM: PORTABLE ABDOMEN - 1 VIEW COMPARISON:  06/22/2015 FINDINGS: Persistent dilatation of central small bowel loops. Persistent particular debris within distended stomach. No evidence first free intraperitoneal air on this supine view of the abdomen. IMPRESSION: Persistent small bowel obstruction. Electronically Signed   By: Nolon Nations M.D.   On: 06/23/2015 16:52    Oren Binet, MD  [facility] Pager:[phone]  If 7PM-7AM, please contact night-coverage [email or web address] Password [email or web address] 07/01/2015, 1:46 PM   LOS: 12 days

## 2015-07-01 NOTE — Progress Notes (Signed)
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

## 2015-07-01 NOTE — Progress Notes (Signed)
[facility] Progress Note     Subjective: Pt with a lot less pain, was a little nauseated because every time he tries the liquids he has diarrhea.  Denies fevers/chills.  Urinating well.  Denies CP/SOB, says he doesn't urinate.  Objective: Vital signs in last 24 hours: Temp:  [98.2 F (36.8 C)-98.9 F (37.2 C)] 98.4 F (36.9 C) (03/26 0540) Pulse Rate:  [100-105] 105 (03/26 0540) Resp:  [18-22] 19 (03/26 0540) BP: (125-138)/(52-69) 128/69 mmHg (03/26 0540) SpO2:  [96 %-99 %] 99 % (03/26 0540) Weight:  [51.2 kg (112 lb 14 oz)] 51.2 kg (112 lb 14 oz) (03/25 1957) Last BM Date: 06/30/15  Intake/Output from previous day: 03/25 0701 - 03/26 0700 In: 2120 [P.O.:840; I.V.:780; IV Piggyback:500] Out: 751 [Stool:751] Intake/Output this shift:    PE: Gen:  Alert, NAD, pleasant Card:  RRR, no M/G/R heard Pulm:  CTA, no W/R/R, good efffort Abd: Soft, ND, tender only to deep palpation in the RLQ, no peritoneal signs, +BS, no HSM, no masses Ext:  Soft and NT, no edema/cyanosis  Lab Results:   Recent Labs  06/29/15 0658 07/01/15 0523  WBC 8.8 13.5*  HGB 6.8* 11.5*  HCT 20.0* 34.0*  PLT 335 462*   BMET  Recent Labs  06/29/15 0658 07/01/15 0523  NA 137 138  K 3.8 4.4  CL 104 110  CO2 24 18*  GLUCOSE 110* 85  BUN 23* 21*  CREATININE 5.66* 4.90*  CALCIUM 8.9 8.9   PT/INR No results for input(s): LABPROT, INR in the last 72 hours. CMP     Component Value Date/Time   NA 138 07/01/2015 0523   K 4.4 07/01/2015 0523   CL 110 07/01/2015 0523   CO2 18* 07/01/2015 0523   GLUCOSE 85 07/01/2015 0523   BUN 21* 07/01/2015 0523   CREATININE 4.90* 07/01/2015 0523   CREATININE 5.38* 03/06/2015 1441   CALCIUM 8.9 07/01/2015 0523   PROT 7.2 06/25/2015 1400   ALBUMIN 2.0* 07/01/2015 0523   AST 47* 06/08/2015 1400   ALT 24 06/22/2015 1400   ALKPHOS 105 06/12/2015 1400   BILITOT 1.1 06/24/2015 1400   GFRNONAA 10* 07/01/2015 0523   GFRAA 12* 07/01/2015 0523    Lipase     Component Value Date/Time   LIPASE 18 06/16/2015 1400       Studies/Results: Ct Abdomen Pelvis Wo Contrast  06/29/2015  CLINICAL DATA:  Followup.  Patient had a perforated appendicitis. EXAM: CT ABDOMEN AND PELVIS WITHOUT CONTRAST TECHNIQUE: Multidetector CT imaging of the abdomen and pelvis was performed following the standard protocol without IV contrast. COMPARISON:  06/24/2015. FINDINGS: Gastrointestinal: Appendix remains dilated now all measuring a maximum of 11 mm. Fluid collection along the appendiceal base is less defined than on the prior study. It appears smaller. There are no new periappendiceal fluid collections. There is no extraluminal air. There is dilation of the small bowel with the distal small bowel adjacent to the inflamed appendix being decompressed. Portions of the colon are also dilated. As noted previously, this is likely due to diffuse adynamic ileus. Partial small bowel obstruction at the level of the inflamed appendix, however, is possible. No bowel wall thickening. No other inflammatory changes. Lung bases: Small effusions. Dependent lung base atelectasis. Heart normal in size. Dense coronary artery calcifications. Hepatobiliary: Focus of calcification in the posterior aspect of the left liver lobe, lateral segment, stable. No other liver lesion. Mild thickening of the gallbladder wall similar to the prior exam. No gallstones. No  bile duct dilation. Spleen, pancreas, adrenal glands:  Unremarkable. Kidneys, ureters, bladder: Significant bilateral renal cortical thinning. Multiple low-density renal masses consistent with cysts. No hydronephrosis. Ureters normal course and in caliber. Bladder is mostly decompressed but otherwise unremarkable. Lymph nodes:  No adenopathy. IMPRESSION: 1. The appendix is less dilated common now 11 mm in diameter, previously 13.5 mm. The periappendiceal collection at the appendiceal base is smaller and less well-defined. There are no  new fluid collections. There is no extraluminal air. 2. There is still small bowel dilation with a maximum diameter of 4.2 cm. This may reflect an adynamic ileus, but there is decompressed small bowel distally adjacent to the inflamed appendix. Partial small bowel obstruction should be considered. 3. Mild wall thickening of the gallbladder which is most likely reactive to the right lower quadrant inflammatory process. 4. Small pleural effusions and lung base atelectasis similar to the prior study. 5. Chronic renal disease with numerous bilateral renal cysts, unchanged. Electronically Signed   By: Lajean Manes M.D.   On: 06/29/2015 19:44    Anti-infectives: Anti-infectives    Start     Dose/Rate Route Frequency Ordered Stop   06/20/15 2230  ciprofloxacin (CIPRO) IVPB 400 mg     400 mg 200 mL/hr over 60 Minutes Intravenous Every 24 hours 06/14/2015 2244     06/20/15 0500  metroNIDAZOLE (FLAGYL) IVPB 500 mg     500 mg 100 mL/hr over 60 Minutes Intravenous Every 8 hours 06/27/2015 2208     06/26/2015 2100  ciprofloxacin (CIPRO) IVPB 400 mg     400 mg 200 mL/hr over 60 Minutes Intravenous  Once 06/10/2015 2047 07/01/2015 2207   06/11/2015 2100  metroNIDAZOLE (FLAGYL) IVPB 500 mg     500 mg 100 mL/hr over 60 Minutes Intravenous  Once 06/23/2015 2047 07/01/2015 2207       Assessment/Plan Perforated appendicitis with phlegmon-continue with IV antibiotics.Repeat CT scan (06/29/15) showed smaller appendix dilatation, and smaller phlegmon, ileus pattern. Clinically improved today. When tolerating POs, can transition to PO antibiotics for a total of 14 days of therapy. Long term plan, OP follow up in our office 2 weeks. Interval appendectomy in 6-8 weeks. Adynamic ileus-improving, now having diarrhea, keep a concern for CDiff if diarrhea continues ID - leukocytosis up to 13.5 today PCM-May be able to hold off on TPN if he's able to tolerate PO, he is very frail, but will leave this up to Primary and Renal since  he's a HD pt and will likely need central line Disp-continue antibiotics, trial of advanced diet, monitor WBC    LOS: 12 days    Nat Christen 07/01/2015, 8:29 AM Pager: [phone]  (7am - 4:30pm M-F; 7am - 11:30am Sa/Su)

## 2015-07-02 DIAGNOSIS — R1031 Right lower quadrant pain: Secondary | ICD-10-CM

## 2015-07-02 LAB — CBC WITH DIFFERENTIAL/PLATELET
BASOS ABS: 0.1 10*3/uL (ref 0.0–0.1)
Basophils Relative: 1 %
Eosinophils Absolute: 0.5 10*3/uL (ref 0.0–0.7)
Eosinophils Relative: 5 %
HEMATOCRIT: 33.8 % — AB (ref 39.0–52.0)
HEMOGLOBIN: 11.1 g/dL — AB (ref 13.0–17.0)
LYMPHS PCT: 9 %
Lymphs Abs: 1 10*3/uL (ref 0.7–4.0)
MCH: 27.8 pg (ref 26.0–34.0)
MCHC: 32.8 g/dL (ref 30.0–36.0)
MCV: 84.5 fL (ref 78.0–100.0)
Monocytes Absolute: 1.5 10*3/uL — ABNORMAL HIGH (ref 0.1–1.0)
Monocytes Relative: 13 %
NEUTROS ABS: 8 10*3/uL — AB (ref 1.7–7.7)
NEUTROS PCT: 72 %
PLATELETS: 523 10*3/uL — AB (ref 150–400)
RBC: 4 MIL/uL — AB (ref 4.22–5.81)
RDW: 16.5 % — ABNORMAL HIGH (ref 11.5–15.5)
WBC: 11.1 10*3/uL — ABNORMAL HIGH (ref 4.0–10.5)

## 2015-07-02 LAB — CBC
HCT: 31.8 % — ABNORMAL LOW (ref 39.0–52.0)
Hemoglobin: 10.8 g/dL — ABNORMAL LOW (ref 13.0–17.0)
MCH: 28.8 pg (ref 26.0–34.0)
MCHC: 34 g/dL (ref 30.0–36.0)
MCV: 84.8 fL (ref 78.0–100.0)
Platelets: 491 K/uL — ABNORMAL HIGH (ref 150–400)
RBC: 3.75 MIL/uL — ABNORMAL LOW (ref 4.22–5.81)
RDW: 16.9 % — ABNORMAL HIGH (ref 11.5–15.5)
WBC: 10.9 K/uL — ABNORMAL HIGH (ref 4.0–10.5)

## 2015-07-02 LAB — RENAL FUNCTION PANEL
Albumin: 2 g/dL — ABNORMAL LOW (ref 3.5–5.0)
Anion gap: 10 (ref 5–15)
BUN: 36 mg/dL — ABNORMAL HIGH (ref 6–20)
CO2: 11 mmol/L — ABNORMAL LOW (ref 22–32)
Calcium: 8.6 mg/dL — ABNORMAL LOW (ref 8.9–10.3)
Chloride: 114 mmol/L — ABNORMAL HIGH (ref 101–111)
Creatinine, Ser: 6.44 mg/dL — ABNORMAL HIGH (ref 0.61–1.24)
GFR calc Af Amer: 9 mL/min — ABNORMAL LOW (ref >60)
GFR calc non Af Amer: 7 mL/min — ABNORMAL LOW (ref >60)
Glucose, Bld: 95 mg/dL (ref 65–99)
Phosphorus: 2.3 mg/dL — ABNORMAL LOW (ref 2.5–4.6)
Potassium: 4.5 mmol/L (ref 3.5–5.1)
Sodium: 135 mmol/L (ref 135–145)

## 2015-07-02 MED ORDER — NEPRO/CARBSTEADY PO LIQD
237.0000 mL | Freq: Two times a day (BID) | ORAL | Status: DC
  Filled 2015-07-02 (×4): qty 237

## 2015-07-02 MED ORDER — CIPROFLOXACIN HCL 500 MG PO TABS
500.0000 mg | ORAL_TABLET | Freq: Every day | ORAL | Status: DC
  Filled 2015-07-02: qty 1

## 2015-07-02 MED ORDER — LIDOCAINE HCL (PF) 1 % IJ SOLN: 5.0000 mL | INTRAMUSCULAR | Status: DC | PRN

## 2015-07-02 MED ORDER — ALTEPLASE 2 MG IJ SOLR: 2.0000 mg | Freq: Once | INTRAMUSCULAR | Status: DC | PRN

## 2015-07-02 MED ORDER — SODIUM CHLORIDE 0.9 % IV SOLN: 100.0000 mL | INTRAVENOUS | Status: DC | PRN

## 2015-07-02 MED ORDER — HEPARIN SODIUM (PORCINE) 1000 UNIT/ML DIALYSIS: 1000.0000 [IU] | INTRAMUSCULAR | Status: DC | PRN

## 2015-07-02 MED ORDER — LIDOCAINE-PRILOCAINE 2.5-2.5 % EX CREA: 1.0000 "application " | TOPICAL_CREAM | CUTANEOUS | Status: DC | PRN

## 2015-07-02 MED ORDER — METRONIDAZOLE 500 MG PO TABS
500.0000 mg | ORAL_TABLET | Freq: Three times a day (TID) | ORAL | Status: DC
  Administered 2015-07-02 (×2): 500 mg via ORAL
  Filled 2015-07-02 (×2): qty 1

## 2015-07-02 MED ORDER — PENTAFLUOROPROP-TETRAFLUOROETH EX AERO: 1.0000 "application " | INHALATION_SPRAY | CUTANEOUS | Status: DC | PRN

## 2015-07-02 NOTE — Plan of Care (Signed)
Problem: Skin Integrity: Goal: Risk for impaired skin integrity will decrease Outcome: Not Progressing Patient with a DTI on his left nare. Stage 2 pressure wound on his coccyx. And full thickness wounds on his penis/scrotum.   Problem: Activity: Goal: Risk for activity intolerance will decrease Outcome: Progressing Up to recliner for a short time today.

## 2015-07-02 NOTE — Procedures (Signed)
Patient was seen on dialysis and the procedure was supervised. BFR 400 Via LUE AVF BP is 106/64.  Patient appears to be tolerating treatment well.

## 2015-07-02 NOTE — Progress Notes (Signed)
Nutrition Follow-up  DOCUMENTATION CODES:   Severe malnutrition in context of chronic illness, Underweight  INTERVENTION:  Discontinue Boost Breeze.  Provide Nepro Shake po BID, each supplement provides 425 kcal and 19 grams protein.  Continue 30 ml Prostat po BID, each supplement provides 100 kcal and 15 grams of protein.   Encourage adequate PO intake.   NUTRITION DIAGNOSIS:   Malnutrition related to chronic illness as evidenced by severe depletion of body fat, severe depletion of muscle mass; ongoing  GOAL:   Patient will meet greater than or equal to 90% of their needs; progressing  MONITOR:   Diet advancement, Supplement acceptance, Weight trends, Labs, I & O's, Skin  REASON FOR ASSESSMENT:   Consult Assessment of nutrition requirement/status  ASSESSMENT:   80 y.o. male with a history of ESRD on HD (MWF), HTN, Gout who presents to the ED with complaints of ABD pain and Diarrhea x 2 days. He denies any Fever or Chills. He was evaluated in the ED and A CT scn of the ABD was performed and revealed Enteritis changes. NGT removed. Perforated appendicitis with phlegmon improving.   Meal completion has been 25%. Pt currently has Prostat ordered and has been consuming them, however has been refusing the [employer]. RD to discontinue Boost Breeze and order Nepro Shake. Pt encouraged to eat his food at meals.   Labs and medications reviewed.   Diet Order:  DIET - DYS 1 Room service appropriate?: Yes; Fluid consistency:: Thin  Skin:  Wound (see comment) (DTI on L nare, Stage II on coccyx)  Last BM:  3/27  Height:   Ht Readings from Last 1 Encounters:  06/20/15 5\' 9"  (1.753 m)    Weight:   Wt Readings from Last 1 Encounters:  07/02/15 110 lb 3.7 oz (50 kg)    Ideal Body Weight:  67.9 kg (adjusted for R arm amputation)  BMI:  Body mass index is 16.27 kg/(m^2).  Estimated Nutritional Needs:   Kcal:  1700-1900  Protein:  75-85 grams  Fluid:  Per  MD  EDUCATION NEEDS:   No education needs identified at this time  Corrin Parker, MS, RD, LDN Pager # [phone] After hours/ weekend pager # [phone]

## 2015-07-02 NOTE — Progress Notes (Signed)
PATIENT DETAILS Name: Brad [name] Age: 80 y.o. Sex: male Date of Birth: 12-03-1935 Admit Date: 06/14/2015 Admitting Physician Brad Millard, MD KM:[id number] Brad Dawes, NP  Brief narrative: 80 year old with a history of an stage renal disease on dialysis presented with abdominal pain. Initial CT abdomen on 3/14 was suggestive of enteritis, however patient continued to have abdominal pain and developed ileus. Repeat CT abdomen on 3/19 showed acute appendicitis with proximal rupture and small periappendiceal phlegmon. Subsequently started on empiric antibiotics- general surgery consulted and following. Patient slowly improving.   Subjective: Feels better, Very minimal right lower quadrant pain. Claims to have multiple bowel movements.  Assessment/Plan: Principal Problem: Perforated appendicitis with phlegmon: Continues to improve, Cipro/Flagyl-which have now been changed to oral. Repeat CT abdomen on 3/24 with some improvement. Gen. Surgery following, diet has not been upgraded to a dysphagia 1 diet. Abdomen is soft, very minimal tenderness on right lower quadrant.  Active Problems: ESRD: On hemodialysis MWF. Nephrology following.  Scroum/Penile ulcers:appear moisture related-have consulted Urology  Anemia: No overt blood loss, likely anemia secondary to acute illness on top of chronic disease. Transfused 2 units of PRBC on 3/24, Hemoglobin stable posttransfusion. Aranesp per nephrology.  Hypertension: Continue to hold all antihypertensives, as patient developed hypotension post dialysis. Blood pressure stable. May need Midodrine hemodialysis days if this trend continues.  History of lumbar discitis: CT abdomen on 3/19 showed severe erosive changes at L4-L5-radiology, this is not appreciably changed back to 12/05/14-per radiology likely favored to have chronic discitis/osteomyelitis. I suspect this reflects residual changes from his most recent  infection.  Protein-calorie malnutrition, severe: Continue supplements  Pain and swelling of left ankle: Seems to have resolved/stable. Supportive care.  Ectatic ascending aorta: Seen incidentally on CT chest on 3/16, recommendations are to pursue annual imaging follow-up CTA. Will defer to outpatient MDs.  Disposition: Remain inpatient-SNF over the next 1-2 days  Antimicrobial agents  See below  Anti-infectives    Start     Dose/Rate Route Frequency Ordered Stop   24-Jul-2015 0800  ciprofloxacin (CIPRO) tablet 500 mg     500 mg Oral Daily with breakfast 07/02/15 0948     07/02/15 1400  metroNIDAZOLE (FLAGYL) tablet 500 mg     500 mg Oral 3 times per day 07/02/15 0948     06/20/15 2230  ciprofloxacin (CIPRO) IVPB 400 mg  Status:  Discontinued     400 mg 200 mL/hr over 60 Minutes Intravenous Every 24 hours 06/26/2015 2244 07/02/15 0948   06/20/15 0500  metroNIDAZOLE (FLAGYL) IVPB 500 mg  Status:  Discontinued     500 mg 100 mL/hr over 60 Minutes Intravenous Every 8 hours 06/30/2015 2208 07/02/15 0948   06/18/2015 2100  ciprofloxacin (CIPRO) IVPB 400 mg     400 mg 200 mL/hr over 60 Minutes Intravenous  Once 06/17/2015 2047 06/06/2015 2207   06/17/2015 2100  metroNIDAZOLE (FLAGYL) IVPB 500 mg     500 mg 100 mL/hr over 60 Minutes Intravenous  Once 06/07/2015 2047 06/20/2015 2207      DVT Prophylaxis: Prophylactic Heparin   Code Status: Full code  Family Communication Spouse and son at bedside  Procedures: None  CONSULTS:  GI, nephrology and general surgery  Time spent 20 minutes-Greater than 50% of this time was spent in counseling, explanation of diagnosis, planning of further management, and coordination of care.  MEDICATIONS: Scheduled Meds: . sodium chloride   Intravenous Once  .  sodium chloride   Intravenous Once  . antiseptic oral rinse  7 mL Mouth Rinse q12n4p  . chlorhexidine  15 mL Mouth Rinse BID  . cinacalcet  60 mg Oral Q supper  . [START ON Jul 07, 2015] ciprofloxacin   500 mg Oral Q breakfast  . darbepoetin (ARANESP) injection - DIALYSIS  100 mcg Intravenous Q Fri-HD  . feeding supplement  1 Container Oral TID BM  . feeding supplement (PRO-STAT SUGAR FREE 64)  30 mL Oral BID  . heparin  5,000 Units Subcutaneous 3 times per day  . metroNIDAZOLE  500 mg Oral 3 times per day  . multivitamin  1 tablet Oral QHS   Continuous Infusions: . dextrose 5 % and 0.9% NaCl 30 mL/hr at 07/02/15 1056   PRN Meds:.sodium chloride, sodium chloride, [DISCONTINUED] acetaminophen **OR** acetaminophen, alteplase, dextrose, heparin, HYDROmorphone (DILAUDID) injection, lidocaine (PF), lidocaine-prilocaine, metoprolol, [DISCONTINUED] ondansetron **OR** ondansetron (ZOFRAN) IV, pentafluoroprop-tetrafluoroeth    PHYSICAL EXAM: Vital signs in last 24 hours: Filed Vitals:   07/01/15 1736 07/01/15 2026 07/02/15 0540 07/02/15 0900  BP: 131/70 110/57 122/63 141/62  Pulse: 99 105 97 107  Temp:  98.3 F (36.8 C) 98.5 F (36.9 C) 98.4 F (36.9 C)  TempSrc:  Oral Oral Oral  Resp:  19 20 18   Height:      Weight:  51.4 kg (113 lb 5.1 oz)    SpO2:  99% 99% 95%    Weight change: 0.2 kg (7.1 oz) Filed Weights   06/29/15 1037 06/30/15 1957 07/01/15 2026  Weight: 51.6 kg (113 lb 12.1 oz) 51.2 kg (112 lb 14 oz) 51.4 kg (113 lb 5.1 oz)   Body mass index is 16.73 kg/(m^2).   Gen Exam: Awake and alert with clear speech.  Neck: Supple, No JVD.  Chest: B/L Clear.   CVS: S1 S2 Regular, no murmurs.  Abdomen: soft, BS +, non tender, non distended.  Extremities: no edema, lower extremities warm to touch. Neurologic: Non Focal.   Skin: No Rash.   Wounds: N/A.    Intake/Output from previous day:  Intake/Output Summary (Last 24 hours) at 07/02/15 1309 Last data filed at 07/02/15 0615  Gross per 24 hour  Intake  774.5 ml  Output    150 ml  Net  624.5 ml     LAB RESULTS: CBC  Recent Labs Lab 06/29/15 0606 06/29/15 0658 07/01/15 0523 07/02/15 0754 07/02/15 1227  WBC 9.2  8.8 13.5* 11.1* 10.9*  HGB 7.4* 6.8* 11.5* 11.1* 10.8*  HCT 22.1* 20.0* 34.0* 33.8* 31.8*  PLT 382 335 462* 523* 491*  MCV 83.7 83.0 84.2 84.5 84.8  MCH 28.0 28.2 28.5 27.8 28.8  MCHC 33.5 34.0 33.8 32.8 34.0  RDW 16.3* 16.1* 16.3* 16.5* 16.9*  LYMPHSABS  --   --   --  1.0  --   MONOABS  --   --   --  1.5*  --   EOSABS  --   --   --  0.5  --   BASOSABS  --   --   --  0.1  --     Chemistries   Recent Labs Lab 06/27/15 0713 06/28/15 0658 06/29/15 0606 06/29/15 0658 07/01/15 0523  NA 137 139 138 137 138  K 3.5 3.9 4.0 3.8 4.4  CL 101 100* 103 104 110  CO2 23 27 25 24  18*  GLUCOSE 117* 95 110* 110* 85  BUN 23* 10 23* 23* 21*  CREATININE 5.77* 3.95* 5.64* 5.66* 4.90*  CALCIUM 8.6*  8.7* 9.1 8.9 8.9    CBG:  Recent Labs Lab 06/29/15 0757  GLUCAP 109*    GFR Estimated Creatinine Clearance: 9.2 mL/min (by C-G formula based on Cr of 4.9).  Coagulation profile No results for input(s): INR, PROTIME in the last 168 hours.  Cardiac Enzymes No results for input(s): CKMB, TROPONINI, MYOGLOBIN in the last 168 hours.  Invalid input(s): CK  Invalid input(s): POCBNP No results for input(s): DDIMER in the last 72 hours. No results for input(s): HGBA1C in the last 72 hours. No results for input(s): CHOL, HDL, LDLCALC, TRIG, CHOLHDL, LDLDIRECT in the last 72 hours. No results for input(s): TSH, T4TOTAL, T3FREE, THYROIDAB in the last 72 hours.  Invalid input(s): FREET3 No results for input(s): VITAMINB12, FOLATE, FERRITIN, TIBC, IRON, RETICCTPCT in the last 72 hours. No results for input(s): LIPASE, AMYLASE in the last 72 hours.  Urine Studies No results for input(s): UHGB, CRYS in the last 72 hours.  Invalid input(s): UACOL, UAPR, USPG, UPH, UTP, UGL, UKET, UBIL, UNIT, UROB, ULEU, UEPI, UWBC, URBC, UBAC, CAST, UCOM, BILUA  MICROBIOLOGY: No results found for this or any previous visit (from the past 240 hour(s)).  RADIOLOGY STUDIES/RESULTS: Ct Abdomen Pelvis Wo  Contrast  06/29/2015  CLINICAL DATA:  Followup.  Patient had a perforated appendicitis. EXAM: CT ABDOMEN AND PELVIS WITHOUT CONTRAST TECHNIQUE: Multidetector CT imaging of the abdomen and pelvis was performed following the standard protocol without IV contrast. COMPARISON:  06/24/2015. FINDINGS: Gastrointestinal: Appendix remains dilated now all measuring a maximum of 11 mm. Fluid collection along the appendiceal base is less defined than on the prior study. It appears smaller. There are no new periappendiceal fluid collections. There is no extraluminal air. There is dilation of the small bowel with the distal small bowel adjacent to the inflamed appendix being decompressed. Portions of the colon are also dilated. As noted previously, this is likely due to diffuse adynamic ileus. Partial small bowel obstruction at the level of the inflamed appendix, however, is possible. No bowel wall thickening. No other inflammatory changes. Lung bases: Small effusions. Dependent lung base atelectasis. Heart normal in size. Dense coronary artery calcifications. Hepatobiliary: Focus of calcification in the posterior aspect of the left liver lobe, lateral segment, stable. No other liver lesion. Mild thickening of the gallbladder wall similar to the prior exam. No gallstones. No bile duct dilation. Spleen, pancreas, adrenal glands:  Unremarkable. Kidneys, ureters, bladder: Significant bilateral renal cortical thinning. Multiple low-density renal masses consistent with cysts. No hydronephrosis. Ureters normal course and in caliber. Bladder is mostly decompressed but otherwise unremarkable. Lymph nodes:  No adenopathy. IMPRESSION: 1. The appendix is less dilated common now 11 mm in diameter, previously 13.5 mm. The periappendiceal collection at the appendiceal base is smaller and less well-defined. There are no new fluid collections. There is no extraluminal air. 2. There is still small bowel dilation with a maximum diameter of 4.2  cm. This may reflect an adynamic ileus, but there is decompressed small bowel distally adjacent to the inflamed appendix. Partial small bowel obstruction should be considered. 3. Mild wall thickening of the gallbladder which is most likely reactive to the right lower quadrant inflammatory process. 4. Small pleural effusions and lung base atelectasis similar to the prior study. 5. Chronic renal disease with numerous bilateral renal cysts, unchanged. Electronically Signed   By: Lajean Manes M.D.   On: 06/29/2015 19:44   Ct Abdomen Pelvis Wo Contrast  06/09/2015  CLINICAL DATA:  Mid abdominal pain and diarrhea for several  days. EXAM: CT ABDOMEN AND PELVIS WITHOUT CONTRAST TECHNIQUE: Multidetector CT imaging of the abdomen and pelvis was performed following the standard protocol without IV contrast. COMPARISON:  None. FINDINGS: Lower chest: No acute findings. Mild atelectasis and/or fibrosis at each lung base. Hepatobiliary: No mass visualized on this un-enhanced exam. Chronic benign-appearing dystrophic calcification within the left hepatic lobe. Gallbladder moderately distended but otherwise unremarkable. No bile duct dilatation. Pancreas: No mass or inflammatory process identified on this un-enhanced exam. Spleen: Within normal limits in size. Adrenals/Urinary Tract: Numerous renal cysts bilaterally. No renal stone or hydronephrosis bilaterally. No ureteral or bladder calculi identified. Bladder is decompressed. Stomach/Bowel: Moderately distended small bowel throughout the abdomen and pelvis, fluid-filled throughout, with most prominent distension within the upper abdomen. Thickening of the walls of the distal small bowel (terminal ileum) which is presumed to be partially obstructive. Associated inflammation and fluid stranding within the right lower quadrant mesentery adjacent to the terminal ileum. Large bowel is relatively decompressed throughout. Vascular/Lymphatic: Atherosclerotic changes of the normal-  caliber abdominal aorta and pelvic vasculature. No enlarged lymph nodes seen within the abdomen or pelvis. Reproductive: No mass or other significant abnormality. Other: No abscess collections seen. No free intraperitoneal air. No evidence of pneumatosis intestinalis. Musculoskeletal: Marked destructive changes centered at the L4-L5 disc space, compatible with sequela of previous discitis/osteomyelitis, previously documented on lumbar spine MRI of 12/05/2014. No new osseous abnormality. Superficial soft tissues are unremarkable. IMPRESSION: 1. Moderately distended small bowel throughout the abdomen and pelvis, fluid-filled throughout, most prominent distension is of the upper abdominal small bowel and stomach. 2. Thickening of the walls of the distal small bowel (terminal ileum) is presumed to be partially obstructive given the dilatation of the more proximal small bowel and the relative decompression of the large bowel. This bowel wall thickening likely represents an enteritis of infectious, inflammatory or ischemic etiology, with associated inflammation and fluid stranding in the adjacent right lower quadrant mesentery. Recommend follow-up plain film to ensure the eventual passage of the oral contrast from the small bowel due to the large bowel thereby excluding a complete obstruction. 3. Additional chronic/incidental findings detailed above. Electronically Signed   By: Franki Cabot M.D.   On: 06/08/2015 20:28   Dg Ankle Complete Left  06/27/2015  CLINICAL DATA:  Pain and swelling in the left ankle for 4 days, no reported injury. EXAM: LEFT ANKLE COMPLETE - 3+ VIEW COMPARISON:  None. FINDINGS: There is moderate diffuse soft tissue swelling throughout the left ankle. No fracture, malalignment, suspicious focal osseous lesion or appreciable arthropathy. Vascular calcifications in the soft tissues. IMPRESSION: Moderate diffuse left ankle soft tissue swelling, with no osseous abnormality. Electronically Signed    By: Ilona Sorrel M.D.   On: 06/27/2015 12:53   Dg Abd 1 View  06/22/2015  CLINICAL DATA:  Lower abdominal pain and small bowel obstruction. EXAM: ABDOMEN - 1 VIEW COMPARISON:  06/19/2005 FINDINGS: Degree of small bowel dilatation appears improved with some residual dilatation remaining. There is some transit of diluted oral contrast into the colon. IMPRESSION: Improving small bowel obstruction. Electronically Signed   By: Aletta Edouard M.D.   On: 06/22/2015 14:57   Ct Chest Wo Contrast  06/21/2015  CLINICAL DATA:  Hypoxia; pt unable to put arms up over head Hx of HTN, supraventricular tachycardia, anemia, renal disorder EXAM: CT CHEST WITHOUT CONTRAST TECHNIQUE: Multidetector CT imaging of the chest was performed following the standard protocol without IV contrast. COMPARISON:  12/07/2014 and CT of the abdomen and pelvis 06/27/2015  FINDINGS: Heart: Extensive coronary artery calcifications present. Heart is mildly enlarged. No pericardial effusion identified. Vascular structures: There is atherosclerotic calcification of the thoracic aorta. Aorta is 4.3 cm maximum diameter. There is atherosclerotic calcification of the great vessels. Pulmonary artery is normal in appearance. Mediastinum/thyroid: The visualized portion of the thyroid gland has a normal appearance. No mediastinal, hilar, or axillary adenopathy. Lungs/Airways: Scattered emphysematous changes are identified within the lungs. No focal consolidation. There is mild bibasilar atelectasis. Upper abdomen: Distended stomach containing radiopaque debris. Small partially imaged left kidney with low-attenuation upper pole cyst. Coarse calcification identified within the posterior aspect of the left hepatic lobe. Visualized portion of the left adrenal gland is normal. Chest wall/osseous structures: Negative IMPRESSION: 1. Mild emphysematous changes. 2. Bibasilar atelectasis. 3. Ectatic ascending aorta. Recommend annual imaging followup by CTA or MRA. This  recommendation follows 2010 ACCF/AHA/AATS/ACR/ASA/SCA/SCAI/SIR/STS/SVM Guidelines for the Diagnosis and Management of Patients with Thoracic Aortic Disease. Circulation. 2010; 121: [id number] 4. Distended stomach. 5. Small left kidney with renal cysts, partially imaged. 6. Coronary artery disease. Electronically Signed   By: Nolon Nations M.D.   On: 06/21/2015 13:35   Ct Abdomen Pelvis W Contrast  06/24/2015  CLINICAL DATA:  Small bowel obstruction. Diffuse lower abdominal pain. EXAM: CT ABDOMEN AND PELVIS WITH CONTRAST TECHNIQUE: Multidetector CT imaging of the abdomen and pelvis was performed using the standard protocol following bolus administration of intravenous contrast. CONTRAST:  90mL OMNIPAQUE IOHEXOL 300 MG/ML  SOLN COMPARISON:  06/23/2015 CT abdomen/ pelvis. Abdominal radiographs from 1 day prior. FINDINGS: Lower chest: Small layering bilateral pleural effusions, left greater than right, increased bilaterally. Dependent atelectasis in both lower lobes. Coronary atherosclerosis. Hepatobiliary: Stable 2.4 cm focus of coarse calcification in the posterior lateral segment left liver lobe, probably from remote insult. No additional liver lesions. Mildly distended gallbladder with nonspecific mild-to-moderate diffuse gallbladder wall thickening. No definite pericholecystic fluid. No radiopaque cholelithiasis. There is new mild central intrahepatic biliary ductal dilatation. The common bile duct measures 7 mm diameter, slightly increased from 6 mm. There is a possible 4 mm round density in the lower third of the common bile duct (series 203/image 40), cannot exclude choledocholithiasis. Pancreas: Stable mild diffuse main pancreatic duct dilation (4 mm diameter). No pancreatic mass. No pancreatic thickening or peripancreatic fat stranding or fluid. Spleen: Normal size. No mass. Adrenals/Urinary Tract: Normal adrenals. No hydronephrosis. No renal stones. There are several simple cysts in both kidneys  measuring up to 1.5 cm in the interpolar right kidney and 2.3 cm in the upper left kidney. There are innumerable additional subcentimeter hypodense renal cortical lesions in both kidneys, too small to characterize, not appreciably changed. Collapsed and grossly normal bladder. Stomach/Bowel: Enteric tube terminates in the proximal stomach. The collapsed stomach is grossly normal. The appendix is diffusely dilated and fluid-filled measuring up to 14 mm diameter. The appendiceal wall is diffusely thickened and hyper enhancing. There is mild periappendiceal fat stranding. These findings are in keeping with acute appendicitis. There is an approximately 2 cm discontinuity in the proximal appendiceal wall with the surrounding ill-defined and a tiny focus of extraluminal gas, in keeping with appendiceal perforation and surrounding small phlegmon. There are mildly dilated small bowel loops throughout the jejunum. The distal small bowel is collapsed. No discrete small bowel caliber transition . The colon is relatively collapsed is filled with oral contrast, with no large bowel wall thickening. There is mild diverticulosis in the sigmoid colon. Rectal drainage tube is in place. Vascular/Lymphatic: Atherosclerotic nonaneurysmal abdominal aorta. Patent  portal, splenic, hepatic and renal veins. No pathologically enlarged lymph nodes in the abdomen or pelvis. Reproductive: Top-normal size prostate. Other: No ascites.  No frank pneumoperitoneum . Musculoskeletal: Re- demonstrated is severe erosive and sclerotic change at the L4-5 disc space, not appreciably changed back to 12/05/2014 lumbar spine MRI, worrisome for chronic discitis osteomyelitis. IMPRESSION: 1. Acute appendicitis with proximal appendiceal rupture and small periappendiceal phlegmon. 2. Mildly dilated proximal small bowel loops without discrete small bowel caliber transition, favor adynamic ileus. 3. Small layering bilateral pleural effusions, increased bilaterally.  4. Mild intrahepatic and extrahepatic biliary ductal dilatation, slightly increased. Questionable 4 mm hyperdense filling defect in the lower third of the common bile duct, cannot exclude choledocholithiasis. Nonspecific gallbladder distention and new diffuse gallbladder wall thickening. No radiopaque gallstones in the gallbladder. Recommend correlation with serum bilirubin levels. Further evaluation with MRI abdomen with MRCP as clinically warranted. 5. Severe erosive changes at the L4-5 disc space, not appreciably changed back to 12/05/2014, favor chronic discitis osteomyelitis. These results were called by telephone at the time of interpretation on 06/24/2015 at 11:46 am to RN ANDY BRAKE, who verbally acknowledged these results. These results were called by telephone at the time of interpretation on 06/24/2015 at 11:50 am to Dr. Owens Loffler , who verbally acknowledged these results. Electronically Signed   By: Ilona Sorrel M.D.   On: 06/24/2015 11:52   Dg Abd 2 Views  06/20/2015  CLINICAL DATA:  Small bowel obstruction.  Abdominal pain. EXAM: ABDOMEN - 2 VIEW COMPARISON:  CT abdomen and pelvis 06/16/2015. FINDINGS: Moderate gastric and proximal small bowel distension is again noted. Densities over the left upper quadrant are likely within the stomach. IMPRESSION: Moderate proximal small bowel and gastric distention persists. Electronically Signed   By: San Morelle M.D.   On: 06/20/2015 15:55   Dg Abd Portable 1v  06/25/2015  CLINICAL DATA:  Small bowel obstruction. EXAM: PORTABLE ABDOMEN - 1 VIEW COMPARISON:  CT abdomen and pelvis 10/07/2007.  Abdomen 06/23/2015. FINDINGS: Enteric tube with tip in the left upper quadrant, likely in the upper stomach. Persistent gas distended small bowel with some decompression since previous study. Residual stool and contrast material in the colon. Contrast material in the colon suggests that there is no complete obstruction. No radiopaque stones. Degenerative  changes in the spine. IMPRESSION: Persistent gas distention of small bowel although improved since previous study. Presence of contrast material in the colon suggest no complete obstruction. Electronically Signed   By: Lucienne Capers M.D.   On: 06/25/2015 01:39   Dg Abd Portable 1v  06/24/2015  CLINICAL DATA:  Nasogastric tube placement.  Initial encounter. EXAM: PORTABLE ABDOMEN - 1 VIEW COMPARISON:  Abdominal radiograph performed earlier today at 4:32 p.m. FINDINGS: The patient's enteric tube is noted ending overlying the body of the stomach. The side port is also noted at the body of the stomach. There is diffuse distention of small-bowel loops up to 7.0 cm, concerning for worsening high-grade small bowel obstruction. No free intra-abdominal air is seen, though evaluation for free air is limited on a single supine view. No acute osseous abnormalities are identified. IMPRESSION: 1. Enteric tube noted ending overlying the body of the stomach. 2. Diffuse distention of small bowel loops up to 7.0 cm, concerning for worsening high-grade small bowel obstruction. No free intra-abdominal air seen. Electronically Signed   By: Garald Balding M.D.   On: 06/24/2015 00:47   Dg Abd Portable 1v  06/23/2015  CLINICAL DATA:  history of ESRD  on HD (MWF), HTN, Gout who presented to the ED with ABD pain and diarrhea x 2 days. Evaluate SBO EXAM: PORTABLE ABDOMEN - 1 VIEW COMPARISON:  06/22/2015 FINDINGS: Persistent dilatation of central small bowel loops. Persistent particular debris within distended stomach. No evidence first free intraperitoneal air on this supine view of the abdomen. IMPRESSION: Persistent small bowel obstruction. Electronically Signed   By: Nolon Nations M.D.   On: 06/23/2015 16:52    Oren Binet, MD  [facility] Pager:[phone]  If 7PM-7AM, please contact night-coverage [email or web address] Password [email or web address] 07/02/2015, 1:09 PM   LOS: 13 days

## 2015-07-02 NOTE — Consult Note (Addendum)
WOC wound consult note Reason for Consult: Consult requested for penis wound.  Pt is uncircumcised and is unable to state the etiology of these wounds, there are no family members present in the room to answer questions.  He states he has "used some cream in the past which made it get better." Wound type: Posterior penis with full thickness lesion; 3X3X.2cm, red and dry, no odor or drainage. Shaft of penis with small white spots scattered over the surface. Anterior scrotum with full thickness lesion; 3X2X.2cm, red and dry, no odor or drainage.   Dressing procedure/placement/frequency: Xeroform to decrease wounds from rubbing together and promote moist healing.These wounds are beyond [facility] scope of practice. Please consult urology service for further plan of care for posterior penis/anterior scrotum wounds.   Please re-consult if further assistance is needed.  Thank-you,  Julien Girt MSN, [place], [place], [place], [place]

## 2015-07-02 NOTE — Progress Notes (Signed)
Physical Therapy Treatment Patient Details Name: Brad [name] MRN: [id number] DOB: [birth year] Today's Date: 07/02/2015    History of Present Illness 80 yo male with onset of enteritis with liquid stool, has flexiseal and generalized weakness.  PMHx:  ESRD, anemia, gout, rash, R arm amputation    PT Comments    Pt oob mobility greatly limited by anxiety over falling and continuous loose stool episodes. Pt remains extremely deconditioned and con't to be appropriate for SNF upon d/c.  Follow Up Recommendations  SNF     Equipment Recommendations  None recommended by PT    Recommendations for Other Services       Precautions / Restrictions Precautions Precautions: Fall Restrictions Weight Bearing Restrictions: No    Mobility  Bed Mobility Overal bed mobility: Needs Assistance Bed Mobility: Supine to Sit     Supine to sit: Min assist     General bed mobility comments: pt initiated movement but needed assist for trunk elevation and to scoot to EOB  Transfers Overall transfer level: Needs assistance Equipment used: Hemi-walker Transfers: Sit to/from [facility] Sit to Stand: Mod assist;Max assist Stand pivot transfers: Mod assist;Max assist       General transfer comment: pt with significat fear of falling, resistant to using hemi-walker, but then had loose stool inhibiting further progression with ambulation. pt required max v/c's for safety and L UE placement. pt unable to achieve full upright posture, pt very unsteady  Ambulation/Gait             General Gait Details: pt unable to use hemi-walker due to fear/anxiety, weakness, and having loose stool episode   Stairs            Wheelchair Mobility    Modified Rankin (Stroke Patients Only)       Balance Overall balance assessment: Needs assistance Sitting-balance support: Feet supported Sitting balance-Leahy Scale: Poor Sitting balance - Comments: pt maintainted sitting x 3  min and then leaned backwards to lay on the bed. pt with inc HR to 127 in sitting but SpO2 at 97% on room air Postural control: Posterior lean Standing balance support: Single extremity supported Standing balance-Leahy Scale: Poor Standing balance comment: unable to achieve and/or maintain standing balance without maxA                    Cognition Arousal/Alertness: Awake/alert Behavior During Therapy: WFL for tasks assessed/performed Overall Cognitive Status: Within Functional Limits for tasks assessed                      Exercises      General Comments General comments (skin integrity, edema, etc.): pt dependent for hygiene. pt with noted sore on penis. wound RN notified and came to assess      Pertinent Vitals/Pain Pain Assessment: 0-10 Pain Score: 6  Pain Location: lower abdomen Pain Intervention(s): Monitored during session    Home Living                      Prior Function            PT Goals (current goals can now be found in the care plan section) Progress towards PT goals: Progressing toward goals (slowly due to loose stools)    Frequency  Min 2X/week    PT Plan Current plan remains appropriate    Co-evaluation             End of Session Equipment Utilized During  Treatment: Gait belt Activity Tolerance: Patient limited by fatigue (loose stools) Patient left: in chair;with call bell/phone within reach;with chair alarm set;with nursing/sitter in room     Time: [id number] PT Time Calculation (min) (ACUTE ONLY): 26 min  Charges:  $Therapeutic Activity: 23-37 mins                    G Codes:      Kingsley Callander 07/02/2015, 10:28 AM   Kittie Plater, PT, DPT Pager #: [phone] Office #: [phone]

## 2015-07-02 NOTE — Progress Notes (Signed)
  Subjective: Less pain RLQ, tolerated some grits this AM, drinking well  Objective: Vital signs in last 24 hours: Temp:  [98.2 F (36.8 C)-98.5 F (36.9 C)] 98.5 F (36.9 C) (03/27 0540) Pulse Rate:  [97-107] 97 (03/27 0540) Resp:  [19-20] 20 (03/27 0540) BP: (110-131)/(57-73) 122/63 mmHg (03/27 0540) SpO2:  [98 %-99 %] 99 % (03/27 0540) Weight:  [51.4 kg (113 lb 5.1 oz)] 51.4 kg (113 lb 5.1 oz) (03/26 2026) Last BM Date: 07/01/15  Intake/Output from previous day: 03/26 0701 - 03/27 0700 In: 1254.5 [P.O.:400; I.V.:454.5; IV Piggyback:400] Out: 150 [Stool:150] Intake/Output this shift:    General appearance: cooperative GI: Soft, mild tenderness RLQ, +BS  Lab Results:   Recent Labs  07/01/15 0523 07/02/15 0754  WBC 13.5* 11.1*  HGB 11.5* 11.1*  HCT 34.0* 33.8*  PLT 462* 523*   BMET  Recent Labs  07/01/15 0523  NA 138  K 4.4  CL 110  CO2 18*  GLUCOSE 85  BUN 21*  CREATININE 4.90*  CALCIUM 8.9   Anti-infectives: Anti-infectives    Start     Dose/Rate Route Frequency Ordered Stop   06/20/15 2230  ciprofloxacin (CIPRO) IVPB 400 mg     400 mg 200 mL/hr over 60 Minutes Intravenous Every 24 hours 07/06/2015 2244     06/20/15 0500  metroNIDAZOLE (FLAGYL) IVPB 500 mg     500 mg 100 mL/hr over 60 Minutes Intravenous Every 8 hours 06/07/2015 2208     06/10/2015 2100  ciprofloxacin (CIPRO) IVPB 400 mg     400 mg 200 mL/hr over 60 Minutes Intravenous  Once 06/28/2015 2047 06/20/2015 2207   06/22/2015 2100  metroNIDAZOLE (FLAGYL) IVPB 500 mg     500 mg 100 mL/hr over 60 Minutes Intravenous  Once 06/20/2015 2047 06/26/2015 2207      Assessment/Plan: Perforated appendicitis with phlegmon - Change to oral ABX today, Cipro Flagyl. 14d total course. Plan F/U in our office with Dr. Ninfa Linden for consideration of interval appendectomy. Adynamic ileus - improving, on diet, will see how it goes  LOS: 13 days    Jaeshawn Silvio E 07/02/2015

## 2015-07-02 NOTE — Care Management Important Message (Signed)
Important Message  Patient Details  Name: Brad [name] MRN: [id number] Date of Birth: 1935-06-28   Medicare Important Message Given:  Yes    Loann Quill 07/02/2015, 1:06 PM

## 2015-07-02 NOTE — Consult Note (Signed)
5:35 PM   [place] 12-07-1935 [id number]  Referring provider: Dr. Nena Alexander  Chief Complaint  Patient presents with  . Abdominal Pain  . Emesis    HPI: The patient is a 80 year old gentleman with multiple medical comorbidities including end-stage renal disease who was admitted with a perforated appendix. Urology was consulted for ulcers on the patient's penis and scrotum. The patient is a poor historian, however he states that the ulcers were not there prior to admission. He denies penile/scrotal pain. He notes that he does not make urine anymore. He has never seen a urologist before to his knowledge.   PMH: Past Medical History  Diagnosis Date  . Hypertension   . High cholesterol   . Gout   . Anemia   . Renal disorder     End stage renal disease secondary to HTN nephrosclerosis  . Secondary hyperparathyroidism ([place])   . Hypocalcemia   . Colon polyps   . Paroxysmal supraventricular tachycardia ([place])   . Status post dilatation of esophageal stricture   . Syncope   . Rash and nonspecific skin eruption 01/09/2015  . Loss of weight 01/09/2015  . Poor appetite 01/09/2015    Surgical History: Past Surgical History  Procedure Laterality Date  . Right arm amputation     . Revision of arteriovenous goretex graft Left 12/15/2012    Procedure: REVISION OF ARTERIOVENOUS GORETEX GRAFT using 77mm x 20cm Gortex graft;  Surgeon: Mal Misty, MD;  Location: [facility];  Service: Vascular;  Laterality: Left;  . Esophagogastroduodenoscopy N/A 01/27/2014    Procedure: ESOPHAGOGASTRODUODENOSCOPY (EGD);  Surgeon: Beryle Beams, MD;  Location: [facility];  Service: Endoscopy;  Laterality: N/A;  . Esophageal dilation N/A 01/27/2014    Procedure: ESOPHAGEAL DILATION;  Surgeon: Beryle Beams, MD;  Location: [facility] ENDOSCOPY;  Service: Endoscopy;  Laterality: N/A;  Balloon Dilation  . Esophagogastroduodenoscopy N/A 08/15/2014    Procedure: ESOPHAGOGASTRODUODENOSCOPY (EGD);  Surgeon: Carol Ada, MD;  Location: [facility] ENDOSCOPY;  Service: Endoscopy;  Laterality: N/A;  . Colonoscopy N/A 08/17/2014    Procedure: COLONOSCOPY;  Surgeon: Carol Ada, MD;  Location: [facility] ENDOSCOPY;  Service: Endoscopy;  Laterality: N/A;    Home Medications:    Medication List    ASK your doctor about these medications        acetaminophen 500 MG tablet  Commonly known as:  TYLENOL  Take 1,000 mg by mouth every 6 (six) hours as needed for mild pain or moderate pain.     amLODipine 10 MG tablet  Commonly known as:  NORVASC  Take 10 mg by mouth at bedtime. For blood pressure     atorvastatin 40 MG tablet  Commonly known as:  LIPITOR  Take 40 mg by mouth daily.     calcium acetate 667 MG capsule  Commonly known as:  PHOSLO  Take 1,334 mg by mouth 3 (three) times daily with meals. And 1 cap with snack     clotrimazole 1 % cream  Commonly known as:  LOTRIMIN  APPLY TO AFFECTED AREA 2 TIMES A DAY     colchicine 0.6 MG tablet  Take 0.6 mg by mouth 3 (three) times a week. Monday, wednesday, friday     DEXILANT 60 MG capsule  Generic drug:  dexlansoprazole  Take 60 mg by mouth daily.     DIALYVITE TABLET Tabs  Take 1 tablet by mouth daily.     feeding supplement (NEPRO CARB STEADY) Liqd  Take 237 mLs by mouth 2 (  two) times daily between meals.     FOSRENOL 1000 MG chewable tablet  Generic drug:  lanthanum  Chew 1,000 mg by mouth 2 (two) times daily with a meal.     hydrOXYzine 25 MG tablet  Commonly known as:  ATARAX/VISTARIL  TAKE 1 TABLET BY MOUTH EVERY 6 HOURS AS NEEDED FOR ITCHING     losartan 25 MG tablet  Commonly known as:  COZAAR  Take 25 mg by mouth daily.     metoprolol succinate 100 MG 24 hr tablet  Commonly known as:  TOPROL-XL     metoprolol 200 MG 24 hr tablet  Commonly known as:  TOPROL-XL  Take 100 mg by mouth at bedtime.     mirtazapine 7.5 MG tablet  Commonly known as:  REMERON  Take 7.5 mg by mouth daily.     mupirocin ointment 2 %  Commonly known as:   BACTROBAN  Place 1 application into the nose 2 (two) times daily.     omeprazole 20 MG capsule  Commonly known as:  PRILOSEC  Take 20 mg by mouth daily.     SENSIPAR 60 MG tablet  Generic drug:  cinacalcet  Take 60 mg by mouth daily.     traMADol 50 MG tablet  Commonly known as:  ULTRAM  Take 50 mg by mouth every 6 (six) hours as needed for moderate pain.     triamcinolone cream 0.1 %  Commonly known as:  KENALOG  Apply 1 application topically 2 (two) times daily.     vancomycin 250 MG capsule  Commonly known as:  VANCOCIN  Take 1 capsule (250 mg total) by mouth 4 (four) times daily.        Allergies: No Known Allergies  Family History: No family history on file.  Social History:  reports that he has never smoked. He does not have any smokeless tobacco history on file. He reports that he does not drink alcohol or use illicit drugs.  ROS: 12 point ROS unable to obtain                                        Physical Exam: BP 107/61 mmHg  Pulse 117  Temp(Src) 97.5 F (36.4 C) (Oral)  Resp 18  Ht 5\' 9"  (1.753 m)  Wt 110 lb 3.7 oz (50 kg)  BMI 16.27 kg/m2  SpO2 95%  Constitutional:  Alert and oriented, No acute distress. HEENT: [place] AT, moist mucus membranes.  Trachea midline, no masses. Cardiovascular: No clubbing, cyanosis, or edema. Respiratory: Normal respiratory effort, no increased work of breathing. GI: Abdomen is soft, nontender, nondistended, no abdominal masses GU: No CVA tenderness. Phallus has an approximately 3 cm area of skin break down on ventral shaft. Multiple areas of skin break down on scrotum. No sign of infection, crepitus, abscess, or drainage. Of note, his penis and scrotum were positioned beneath the patient in the bed.  Skin: No rashes, bruises or suspicious lesions. Lymph: No cervical or inguinal adenopathy. Neurologic: Grossly intact, no focal deficits, moving all 4 extremities. Psychiatric: Normal mood and  affect.  Laboratory Data: Lab Results  Component Value Date   WBC 10.9* 07/02/2015   HGB 10.8* 07/02/2015   HCT 31.8* 07/02/2015   MCV 84.8 07/02/2015   PLT 491* 07/02/2015    Lab Results  Component Value Date   CREATININE 6.44* 07/02/2015    No results  found for: PSA  No results found for: TESTOSTERONE  No results found for: HGBA1C  Urinalysis No results found for: COLORURINE, APPEARANCEUR, LABSPEC, PHURINE, GLUCOSEU, HGBUR, BILIRUBINUR, KETONESUR, PROTEINUR, UROBILINOGEN, NITRITE, LEUKOCYTESUR  Assessment & Plan:    1. Penile/scrotal skin pressure ulcer The patient has breakdown of skin on the shaft of his penis as well as his scrotum. There is no sign of infection. Breakdown is likely secondary to positioning. Recommend that the patient is positioned with his scrotum and penis above his legs and not underneath or between his legs. No urologic intervention indicated at this time. As this is essentially a pressure ulcer of the penis/scrotum, recommend continued management by wound care of these lesions.    Nickie Retort, MD

## 2015-07-03 ENCOUNTER — Inpatient Hospital Stay (HOSPITAL_COMMUNITY): Payer: Medicare Other

## 2015-07-03 DIAGNOSIS — A419 Sepsis, unspecified organism: Principal | ICD-10-CM

## 2015-07-03 DIAGNOSIS — K3580 Unspecified acute appendicitis: Secondary | ICD-10-CM

## 2015-07-03 DIAGNOSIS — G9340 Encephalopathy, unspecified: Secondary | ICD-10-CM

## 2015-07-03 DIAGNOSIS — N186 End stage renal disease: Secondary | ICD-10-CM

## 2015-07-03 DIAGNOSIS — R6521 Severe sepsis with septic shock: Secondary | ICD-10-CM

## 2015-07-03 DIAGNOSIS — I469 Cardiac arrest, cause unspecified: Secondary | ICD-10-CM

## 2015-07-03 DIAGNOSIS — Z992 Dependence on renal dialysis: Secondary | ICD-10-CM

## 2015-07-03 LAB — CBC
HEMATOCRIT: 24.5 % — AB (ref 39.0–52.0)
HEMOGLOBIN: 7.9 g/dL — AB (ref 13.0–17.0)
MCH: 27.9 pg (ref 26.0–34.0)
MCHC: 32.2 g/dL (ref 30.0–36.0)
MCV: 86.6 fL (ref 78.0–100.0)
Platelets: 280 10*3/uL (ref 150–400)
RBC: 2.83 MIL/uL — AB (ref 4.22–5.81)
RDW: 16.9 % — ABNORMAL HIGH (ref 11.5–15.5)
WBC: 12.6 10*3/uL — ABNORMAL HIGH (ref 4.0–10.5)

## 2015-07-03 LAB — COMPREHENSIVE METABOLIC PANEL
ALBUMIN: 1.6 g/dL — AB (ref 3.5–5.0)
ALT: 20 U/L (ref 17–63)
ALT: 32 U/L (ref 17–63)
AST: 90 U/L — ABNORMAL HIGH (ref 15–41)
AST: 157 U/L — AB (ref 15–41)
Albumin: 1.4 g/dL — ABNORMAL LOW (ref 3.5–5.0)
Alkaline Phosphatase: 104 U/L (ref 38–126)
Alkaline Phosphatase: 158 U/L — ABNORMAL HIGH (ref 38–126)
Anion gap: 19 — ABNORMAL HIGH (ref 5–15)
Anion gap: 20 — ABNORMAL HIGH (ref 5–15)
BILIRUBIN TOTAL: 1.3 mg/dL — AB (ref 0.3–1.2)
BUN: 23 mg/dL — ABNORMAL HIGH (ref 6–20)
BUN: 24 mg/dL — AB (ref 6–20)
CO2: 23 mmol/L (ref 22–32)
CO2: 18 mmol/L — ABNORMAL LOW (ref 22–32)
CREATININE: 4.76 mg/dL — AB (ref 0.61–1.24)
Calcium: 7.3 mg/dL — ABNORMAL LOW (ref 8.9–10.3)
Calcium: 7.4 mg/dL — ABNORMAL LOW (ref 8.9–10.3)
Chloride: 103 mmol/L (ref 101–111)
Chloride: 103 mmol/L (ref 101–111)
Creatinine, Ser: 4.96 mg/dL — ABNORMAL HIGH (ref 0.61–1.24)
GFR calc Af Amer: 12 mL/min — ABNORMAL LOW (ref >60)
GFR calc Af Amer: 12 mL/min — ABNORMAL LOW (ref >60)
GFR calc non Af Amer: 10 mL/min — ABNORMAL LOW (ref >60)
GFR calc non Af Amer: 11 mL/min — ABNORMAL LOW (ref >60)
GLUCOSE: 296 mg/dL — AB (ref 65–99)
Glucose, Bld: 327 mg/dL — ABNORMAL HIGH (ref 65–99)
POTASSIUM: 3.2 mmol/L — AB (ref 3.5–5.1)
Potassium: 3.7 mmol/L (ref 3.5–5.1)
Sodium: 145 mmol/L (ref 135–145)
Sodium: 141 mmol/L (ref 135–145)
TOTAL PROTEIN: 5.4 g/dL — AB (ref 6.5–8.1)
Total Bilirubin: 0.5 mg/dL (ref 0.3–1.2)
Total Protein: 4.2 g/dL — ABNORMAL LOW (ref 6.5–8.1)

## 2015-07-03 LAB — CBC WITH DIFFERENTIAL/PLATELET
Basophils Absolute: 0.1 10*3/uL (ref 0.0–0.1)
Basophils Relative: 2 %
EOS PCT: 2 %
Eosinophils Absolute: 0.1 10*3/uL (ref 0.0–0.7)
HCT: 29 % — ABNORMAL LOW (ref 39.0–52.0)
HEMOGLOBIN: 9.6 g/dL — AB (ref 13.0–17.0)
LYMPHS ABS: 0.4 10*3/uL — AB (ref 0.7–4.0)
Lymphocytes Relative: 16 %
MCH: 27.9 pg (ref 26.0–34.0)
MCHC: 33.1 g/dL (ref 30.0–36.0)
MCV: 84.3 fL (ref 78.0–100.0)
MONOS PCT: 2 %
Monocytes Absolute: 0.1 10*3/uL (ref 0.1–1.0)
NEUTROS ABS: 2 10*3/uL (ref 1.7–7.7)
Neutrophils Relative %: 78 %
Platelets: 297 10*3/uL (ref 150–400)
RBC: 3.44 MIL/uL — ABNORMAL LOW (ref 4.22–5.81)
RDW: 16.8 % — ABNORMAL HIGH (ref 11.5–15.5)
WBC Morphology: INCREASED
WBC: 2.7 10*3/uL — ABNORMAL LOW (ref 4.0–10.5)

## 2015-07-03 LAB — POCT I-STAT 3, ART BLOOD GAS (G3+)
Acid-base deficit: 10 mmol/L — ABNORMAL HIGH (ref 0.0–2.0)
Acid-base deficit: 1 mmol/L (ref 0.0–2.0)
BICARBONATE: 21.3 meq/L (ref 20.0–24.0)
Bicarbonate: 18.1 mEq/L — ABNORMAL LOW (ref 20.0–24.0)
O2 Saturation: 100 %
O2 Saturation: 100 %
PCO2 ART: 50.4 mmHg — AB (ref 35.0–45.0)
PH ART: 7.163 — AB (ref 7.350–7.450)
PH ART: 7.501 — AB (ref 7.350–7.450)
PO2 ART: 243 mmHg — AB (ref 80.0–100.0)
Patient temperature: 98.6
Patient temperature: 36.7
TCO2: 20 mmol/L (ref 0–100)
TCO2: 22 mmol/L (ref 0–100)
pCO2 arterial: 27.2 mmHg — ABNORMAL LOW (ref 35.0–45.0)
pO2, Arterial: 422 mmHg — ABNORMAL HIGH (ref 80.0–100.0)

## 2015-07-03 LAB — GLUCOSE, CAPILLARY
GLUCOSE-CAPILLARY: 298 mg/dL — AB (ref 65–99)
Glucose-Capillary: 56 mg/dL — ABNORMAL LOW (ref 65–99)
Glucose-Capillary: 261 mg/dL — ABNORMAL HIGH (ref 65–99)

## 2015-07-03 LAB — PHOSPHORUS: Phosphorus: 3.9 mg/dL (ref 2.5–4.6)

## 2015-07-03 LAB — LACTIC ACID, PLASMA: LACTIC ACID, VENOUS: 11.4 mmol/L — AB (ref 0.5–2.0)

## 2015-07-03 LAB — MAGNESIUM
MAGNESIUM: 3 mg/dL — AB (ref 1.7–2.4)
Magnesium: 2.4 mg/dL (ref 1.7–2.4)

## 2015-07-03 LAB — TROPONIN I
Troponin I: 0.11 ng/mL — ABNORMAL HIGH (ref <0.031)
Troponin I: 0.17 ng/mL — ABNORMAL HIGH (ref <0.031)

## 2015-07-03 MED ORDER — VANCOMYCIN HCL 10 G IV SOLR
1250.0000 mg | Freq: Once | INTRAVENOUS | Status: AC
  Administered 2015-07-03: 1250 mg via INTRAVENOUS
  Filled 2015-07-03: qty 1250

## 2015-07-03 MED ORDER — STERILE WATER FOR INJECTION IV SOLN
INTRAVENOUS | Status: DC
  Administered 2015-07-03: 09:00:00 via INTRAVENOUS
  Filled 2015-07-03 (×4): qty 850

## 2015-07-03 MED ORDER — PIPERACILLIN-TAZOBACTAM IN DEX 2-0.25 GM/50ML IV SOLN
2.2500 g | Freq: Three times a day (TID) | INTRAVENOUS | Status: DC
  Administered 2015-07-03: 2.25 g via INTRAVENOUS
  Filled 2015-07-03 (×4): qty 50

## 2015-07-03 MED ORDER — CIPROFLOXACIN IN D5W 400 MG/200ML IV SOLN
400.0000 mg | INTRAVENOUS | Status: DC
  Administered 2015-07-03: 400 mg via INTRAVENOUS
  Filled 2015-07-03: qty 200

## 2015-07-03 MED ORDER — PANTOPRAZOLE SODIUM 40 MG IV SOLR
40.0000 mg | Freq: Every day | INTRAVENOUS | Status: DC
  Administered 2015-07-03: 40 mg via INTRAVENOUS
  Filled 2015-07-03: qty 40

## 2015-07-03 MED ORDER — VANCOMYCIN HCL 500 MG IV SOLR: 500.0000 mg | INTRAVENOUS | Status: DC

## 2015-07-03 MED ORDER — INSULIN ASPART 100 UNIT/ML ~~LOC~~ SOLN
0.0000 [IU] | SUBCUTANEOUS | Status: DC
  Administered 2015-07-03 (×2): 5 [IU] via SUBCUTANEOUS

## 2015-07-03 MED ORDER — NOREPINEPHRINE BITARTRATE 1 MG/ML IV SOLN
0.0000 ug/min | INTRAVENOUS | Status: DC
  Administered 2015-07-03: 25 ug/min via INTRAVENOUS
  Filled 2015-07-03: qty 4

## 2015-07-03 MED ORDER — SODIUM CHLORIDE 0.9 % IV SOLN
INTRAVENOUS | Status: DC
  Administered 2015-07-03: 08:00:00 via INTRAVENOUS

## 2015-07-03 MED ORDER — ANTISEPTIC ORAL RINSE SOLUTION (CORINZ)
7.0000 mL | Freq: Four times a day (QID) | OROMUCOSAL | Status: DC
  Administered 2015-07-03: 7 mL via OROMUCOSAL

## 2015-07-03 MED ORDER — NALOXONE HCL 0.4 MG/ML IJ SOLN
INTRAMUSCULAR | Status: AC
  Filled 2015-07-03: qty 1

## 2015-07-03 MED ORDER — CHLORHEXIDINE GLUCONATE 0.12% ORAL RINSE (MEDLINE KIT)
15.0000 mL | Freq: Two times a day (BID) | OROMUCOSAL | Status: DC
  Administered 2015-07-03: 15 mL via OROMUCOSAL

## 2015-07-03 MED ORDER — NOREPINEPHRINE BITARTRATE 1 MG/ML IV SOLN
0.0000 ug/min | INTRAVENOUS | Status: DC
  Administered 2015-07-03: 40 ug/min via INTRAVENOUS
  Filled 2015-07-03: qty 16

## 2015-07-03 MED ORDER — SODIUM BICARBONATE 8.4 % IV SOLN
INTRAVENOUS | Status: AC
  Filled 2015-07-03: qty 50

## 2015-07-03 MED ORDER — AMIODARONE HCL IN DEXTROSE 360-4.14 MG/200ML-% IV SOLN
INTRAVENOUS | Status: AC
  Administered 2015-07-03: 200 mL
  Filled 2015-07-03: qty 200

## 2015-07-03 MED ORDER — DEXTROSE 50 % IV SOLN
INTRAVENOUS | Status: AC
  Filled 2015-07-03: qty 50

## 2015-07-03 MED FILL — Medication: Qty: 1 | Status: AC

## 2015-07-04 ENCOUNTER — Telehealth: Payer: Self-pay | Admitting: Family Medicine

## 2015-07-04 NOTE — Telephone Encounter (Signed)
Entered in error. Disregard.

## 2015-07-05 ENCOUNTER — Telehealth: Payer: Self-pay

## 2015-07-05 NOTE — Telephone Encounter (Signed)
On 07/12/15 I received a death certificate from [facility] (original). The death certificate is for cremation. The patient is a patient of Doctor Dios. The death certificate will be taken to [facility]) this am for signature. On 12-Jul-2015 I received the death certificate back from [place]. I got the death certificate ready and called the funeral home to let them know the death certificate is ready for pickup.

## 2015-07-07 NOTE — Procedures (Signed)
Intubation Procedure Note HEINZ FAGO [id number] 22-Jun-1935  Procedure: Intubation Indications: Respiratory insufficiency  Procedure Details Consent: Unable to obtain consent because of emergent medical necessity. Time Out: Verified patient identification, verified procedure, site/side was marked, verified correct patient position, special equipment/implants available, medications/allergies/relevent history reviewed, required imaging and test results available.  Performed  Maximum sterile technique was used including gloves and mask.  MAC and 3    Evaluation Hemodynamic Status: Transient hypertension requiring treatment; O2 sats: currently acceptable Patient's Current Condition: stable Complications: No apparent complications Patient did tolerate procedure well. Chest X-ray ordered to verify placement.  CXR: pending. Positive easy cap and ETCO2 verified.  Dimple Nanas 2015-07-27

## 2015-07-07 NOTE — Consult Note (Signed)
PULMONARY / CRITICAL CARE MEDICINE   Name: Brad [name] MRN: [id number] DOB: 11/05/1935    ADMISSION DATE:  06/12/2015 CONSULTATION DATE: 3/28  REFERRING MD:  [facility]  CHIEF COMPLAINT:  PEA arrest.   HISTORY OF PRESENT ILLNESS:   80 yo aam with presumed perfed appendix was found early am 3/28 unresponsive and in PEA arrest. CPR, Epi, Intubation and transferred to ICU. He has an extensive PMH as documented below.  PCCM CALLED TO BEDSIDE, CVL AND A LINE PLACED URGENTLY. Epi 1 mg and bicarb 50 meq pushed during procedures for decreasing BP and acidosis. PCCM asked t assume care and LCB(no escalation of care was established with family.   PAST MEDICAL HISTORY :  He  has a past medical history of Hypertension; High cholesterol; Gout; Anemia; Renal disorder; Secondary hyperparathyroidism ([name]); Hypocalcemia; Colon polyps; Paroxysmal supraventricular tachycardia ([place]); Status post dilatation of esophageal stricture; Syncope; Rash and nonspecific skin eruption (01/09/2015); Loss of weight (01/09/2015); and Poor appetite (01/09/2015).  PAST SURGICAL HISTORY: He  has past surgical history that includes right arm amputation ; Revision of arteriovenous goretex graft (Left, 12/15/2012); Esophagogastroduodenoscopy (N/A, 01/27/2014); Esophageal dilation (N/A, 01/27/2014); Esophagogastroduodenoscopy (N/A, 08/15/2014); and Colonoscopy (N/A, 08/17/2014).  No Known Allergies  No current facility-administered medications on file prior to encounter.   Current Outpatient Prescriptions on File Prior to Encounter  Medication Sig  . acetaminophen (TYLENOL) 500 MG tablet Take 1,000 mg by mouth every 6 (six) hours as needed for mild pain or moderate pain.  Marland Kitchen amLODipine (NORVASC) 10 MG tablet Take 10 mg by mouth at bedtime. For blood pressure   . atorvastatin (LIPITOR) 40 MG tablet Take 40 mg by mouth daily.   . B Complex-C-Folic Acid (DIALYVITE TABLET) TABS Take 1 tablet by mouth daily.    . calcium acetate  (PHOSLO) 667 MG capsule Take 1,334 mg by mouth 3 (three) times daily with meals. And 1 cap with snack  . clotrimazole (LOTRIMIN) 1 % cream APPLY TO AFFECTED AREA 2 TIMES A DAY  . colchicine 0.6 MG tablet Take 0.6 mg by mouth 3 (three) times a week. Monday, wednesday, friday  . dexlansoprazole (DEXILANT) 60 MG capsule Take 60 mg by mouth daily.  Marland Kitchen FOSRENOL 1000 MG chewable tablet Chew 1,000 mg by mouth 2 (two) times daily with a meal.   . hydrOXYzine (ATARAX/VISTARIL) 25 MG tablet TAKE 1 TABLET BY MOUTH EVERY 6 HOURS AS NEEDED FOR ITCHING  . losartan (COZAAR) 25 MG tablet Take 25 mg by mouth daily.  . metoprolol (TOPROL-XL) 200 MG 24 hr tablet Take 100 mg by mouth at bedtime.   . mirtazapine (REMERON) 7.5 MG tablet Take 7.5 mg by mouth daily.  . Nutritional Supplements (FEEDING SUPPLEMENT, NEPRO CARB STEADY,) LIQD Take 237 mLs by mouth 2 (two) times daily between meals.  Marland Kitchen omeprazole (PRILOSEC) 20 MG capsule Take 20 mg by mouth daily.    . SENSIPAR 60 MG tablet Take 60 mg by mouth daily.  Marland Kitchen triamcinolone cream (KENALOG) 0.1 % Apply 1 application topically 2 (two) times daily.   . metoprolol succinate (TOPROL-XL) 100 MG 24 hr tablet   . mupirocin ointment (BACTROBAN) 2 % Place 1 application into the nose 2 (two) times daily. (Patient not taking: Reported on 05/08/2015)  . traMADol (ULTRAM) 50 MG tablet Take 50 mg by mouth every 6 (six) hours as needed for moderate pain.   . vancomycin (VANCOCIN) 250 MG capsule Take 1 capsule (250 mg total) by mouth 4 (four) times daily. (Patient not  taking: Reported on 06/24/2015)    FAMILY HISTORY:  His has no family status information on file.   SOCIAL HISTORY: He  reports that he has never smoked. He does not have any smokeless tobacco history on file. He reports that he does not drink alcohol or use illicit drugs.  REVIEW OF SYSTEMS:   NA  SUBJECTIVE:  Post code   VITAL SIGNS: BP 132/103 mmHg  Pulse 82  Temp(Src) 98 F (36.7 C) (Oral)  Resp 24   Ht '5\' 9"'  (1.753 m)  Wt 111 lb 5.3 oz (50.5 kg)  BMI 16.43 kg/m2  SpO2 100%  HEMODYNAMICS:    VENTILATOR SETTINGS: Vent Mode:  [-] PRVC FiO2 (%):  [100 %] 100 % Set Rate:  [24 bmp] 24 bmp Vt Set:  [570 mL] 570 mL PEEP:  [5 cmH20] 5 cmH20 Plateau Pressure:  [20 cmH20] 20 cmH20  INTAKE / OUTPUT: I/O last 3 completed shifts: In: 1944.5 [P.O.:760; I.V.:784.5; IV Piggyback:400] Out: -152 [Stool:150]  PHYSICAL EXAMINATION: General:  Cachetic, AAM inprocess of being coded  Neuro: Unresponsive HEENT:  Pupils dilated an unresponsive Cardiovascular:  Faint Irregular heart sounds Lungs:  Coarse rhonchi bilaterally Abdomen:  No bowel sounds, firm Musculoskeletal: rt arm amputation, left forearm with av graft +bruit and trill Skin: cool and dry  LABS:  BMET  Recent Labs Lab 06/29/15 0658 07/01/15 0523 07/02/15 1227  NA 137 138 135  K 3.8 4.4 4.5  CL 104 110 114*  CO2 24 18* 11*  BUN 23* 21* 36*  CREATININE 5.66* 4.90* 6.44*  GLUCOSE 110* 85 95    Electrolytes  Recent Labs Lab 06/29/15 0658 07/01/15 0523 07/02/15 1227  CALCIUM 8.9 8.9 8.6*  PHOS 2.5 2.2* 2.3*    CBC  Recent Labs Lab 07/01/15 0523 07/02/15 0754 07/02/15 1227  WBC 13.5* 11.1* 10.9*  HGB 11.5* 11.1* 10.8*  HCT 34.0* 33.8* 31.8*  PLT 462* 523* 491*    Coag's No results for input(s): APTT, INR in the last 168 hours.  Sepsis Markers No results for input(s): LATICACIDVEN, PROCALCITON, O2SATVEN in the last 168 hours.  ABG  Recent Labs Lab 07-13-2015 0655  PHART 7.163*  PCO2ART 50.4*  PO2ART 422.0*    Liver Enzymes  Recent Labs Lab 06/29/15 0658 07/01/15 0523 07/02/15 1227  ALBUMIN 1.9* 2.0* 2.0*    Cardiac Enzymes No results for input(s): TROPONINI, PROBNP in the last 168 hours.  Glucose  Recent Labs Lab 06/29/15 0757 2015/07/13 0610  GLUCAP 109* 56*    Imaging No results found.   STUDIES:    CULTURES: 3/28 bc>> 3/28 sputum>>  ANTIBIOTICS: 3/28  cipro>> 3/28 zoysn>> 3/28 vanco>>  SIGNIFICANT EVENTS: 3/28  LINES/TUBES: 3/28 rt fem a line>> 3/28 rt fem cvl>>  DISCUSSION: 80 yo with a plethora of health issues, 3/28 pea arrest x 2, total CPR time 20 minutes.  ASSESSMENT / PLAN:  PULMONARY A: VDRF 3/28 post PEA arrest P:   Vent bundle  Use increased vent rate to off set acidosis CxR chest  CARDIOVASCULAR A:  PEA arrest with cpr x 2 total 20 minutes down time P:  Place aline Multiple Epi boluses. Levo drip after acidosis is corrected Amio bolus 150 mg x 1 for episodes of V tach, may need drip. Suspect when acidosis corrected this will be improved and amio not needed.. Check all electrolytes for imbalances Cards consult  RENAL A:   ESRD HD MWF Mixed acidosis  P:   May need hd cath for cvvh Renal input  Bicarb drip  GASTROINTESTINAL A:   Presumed Perfed appendix P:   Currently not a surgical candidate   HEMATOLOGIC  Recent Labs  07/02/15 0754 07/02/15 1227  HGB 11.1* 10.8*    A:   Chronic anemia P:  Follow cbc  INFECTIOUS A:   Presume intraabdominal infection from possible perfed append. P:   See micro data  ENDOCRINE A:   Hypoglycemia - Resolved.  P:   SSI per algorithm & accu-checks q4hr   NEUROLOGIC A:   Post arrest, PEA(no cooling due to abd process) P:   RASS goal: -1 while on vent Will need CT of head if survives    FAMILY  - Updates: Dr. Ashok Cordia met with family and DNR status established 3/28 with no escalation of care.  - Inter-disciplinary family meet or Palliative Care meeting due by:  day 7    [facility] Minor ACNP Maryanna Shape PCCM Pager [phone] till 3 pm If no answer page [phone] Aug 02, 2015, 7:23 AM   PCCM Attending Note: Patient seen & examined with nurse practitioner. Please refer to his note that I have reviewed. S/P PEA arrest. Continues to be in shock with severe respiratory & metabolic acidosis. Starting Levophed gtt & Bicarb gtt. Holding off on therapeutic  hypothermia given unstable status. Broadening antibiotics with Vancomycin, Zosyn, & Cipro. Patient is now DNR after my discussion with patient's wife, sons, and sisters at bedside. All understand that she is critically ill. Obtaining blood & tracheal aspirate cultures.  I have spent a total of 39 minutes of critical care time today caring for the patient, updating family at bedside including code discussion, and reviewing the patient's electronic medical record.  Sonia Baller Ashok Cordia, M.D. [facility] Pulmonary & Critical Care Pager:  [phone] After 3pm or if no response, call [phone] 8:29 AM 08-02-15

## 2015-07-07 NOTE — Progress Notes (Signed)
Pt asystole on monitor and arterial line waveform flat. Pt is DNR. Pt not breathing over ventilator. No heart sounds auscultated x 2 RN Delbert Phenix, RN and T. Karlton Lemon, RN). RT in room to turn off ventilator. No breath sounds auscultated. Family at bedside. [place]in paged and coming to bedside. Emotional Support provided and Attending MD notified. Time of death: 1400

## 2015-07-07 NOTE — Progress Notes (Signed)
Around 0605 am. My  Attention was called  by a plhebotomist to check patient. (Few minutes ago another phlebotomist was unable to draw blood from patient) Patient noted to be unresponsive, warm to touch, breathless and pulseless. Code blue was initiated. Upon chest compression patient started vomiting large amount of greenish secretion requiring constant suctioning. Code team arrived and took over. Patient was intubated and defibrillated. Called MD and wife at 92. Patient was transferred to 2H09.

## 2015-07-07 NOTE — Progress Notes (Signed)
Nurse calls to inform me that pt had passed at 2pm.  Nurse will update family. pls provide post mortem care.   Monica Becton, MD 2015-08-02, 2:22 PM [facility] Pager [phone] After 3 pm or if no answer, call [phone]

## 2015-07-07 NOTE — Progress Notes (Signed)
Pharmacy Antibiotic Note  Brad [name] is a 80 y.o. male admitted on 06/30/2015 with sepsis and intra-abdominal infection.  Pharmacy has been consulted for Vancocin, Zosyn, and Cipro dosing.  Plan: Vancomycin 1250mg  IV x1 then 500mg  with every HD.  Goal pre-HD level 15-20 mcg/mL. Zosyn 2.25g IV Q8H. Cipro 400mg  IV Q24H.  Height: 5\' 9"  (175.3 cm) Weight: 111 lb 5.3 oz (50.5 kg) IBW/kg (Calculated) : 70.7  Temp (24hrs), Avg:98.1 F (36.7 C), Min:97.5 F (36.4 C), Max:98.4 F (36.9 C)   Recent Labs Lab 06/28/15 0658 06/29/15 0606 06/29/15 0658 07/01/15 0523 07/02/15 0754 07/02/15 1227  WBC 9.7 9.2 8.8 13.5* 11.1* 10.9*  CREATININE 3.95* 5.64* 5.66* 4.90*  --  6.44*    Estimated Creatinine Clearance: 6.9 mL/min (by C-G formula based on Cr of 6.44).    No Known Allergies    Thank you for allowing pharmacy to be a part of this patient's care.  Wynona Neat, PharmD, BCPS  07-22-2015 7:21 AM

## 2015-07-07 NOTE — Code Documentation (Signed)
  Patient Name: Brad [name]   MRN: [id number]   Date of Birth/ Sex: [birth year] , male      Admission Date: 06/22/2015  Attending Provider: Jonetta Osgood, MD  Primary Diagnosis: Abdominal pain, RLQ   Indication: Pt was in his usual state of health until this AM, when he was noted to be unresponsive with no pulse. Last seen 5 minutes prior by phlebotomy doing well. Code blue was subsequently called. At the time of arrival on scene, ACLS protocol was underway.   Technical Description:  - CPR performance duration:  15  minutes  - Was defibrillation or cardioversion used? Yes   - Was external pacer placed? No  - Was patient intubated pre/post CPR? Yes   Medications Administered: Y = Yes; Blank = No Amiodarone    Atropine    Calcium    Epinephrine  4  Lidocaine    Magnesium  1  Norepinephrine    Phenylephrine    Sodium bicarbonate  2  Vasopressin    Narcan x1  Post CPR evaluation:  - Final Status - Was patient successfully resuscitated ? Yes - What is current rhythm? Wide QRS, tachycardia - What is current hemodynamic status? Stable  Miscellaneous Information:  - Labs sent, including: CBC, BMP, Mg, Trop, Lactate, ABG, CXR, EKG  - Primary team notified?  Yes  - Family Notified? Yes  - Additional notes/ transfer status: 2H09     Maryellen Pile, MD  07-08-2015, 6:17 AM

## 2015-07-07 NOTE — Discharge Summary (Signed)
PULMONARY / CRITICAL CARE MEDICINE   Name: Brad [name] MRN: [id number] DOB: [birth year]    ADMISSION DATE:  06/18/2015 CONSULTATION DATE: 3/28  REFERRING MD:  [facility]  CHIEF COMPLAINT:  PEA arrest.   HISTORY OF PRESENT ILLNESS:   80 yo aam with presumed perfed appendix was found early am 3/28 unresponsive and in PEA arrest. CPR, Epi, Intubation and transferred to ICU. He has an extensive PMH as documented below.  PCCM CALLED TO BEDSIDE, CVL AND A LINE PLACED URGENTLY. Epi 1 mg and bicarb 50 meq pushed during procedures for decreasing BP and acidosis. PCCM asked t assume care and LCB(no escalation of care was established with family.   PAST MEDICAL HISTORY :  He  has a past medical history of Hypertension; High cholesterol; Gout; Anemia; Renal disorder; Secondary hyperparathyroidism ([place]); Hypocalcemia; Colon polyps; Paroxysmal supraventricular tachycardia ([place]); Status post dilatation of esophageal stricture; Syncope; Rash and nonspecific skin eruption (01/09/2015); Loss of weight (01/09/2015); and Poor appetite (01/09/2015).  PAST SURGICAL HISTORY: He  has past surgical history that includes right arm amputation ; Revision of arteriovenous goretex graft (Left, 12/15/2012); Esophagogastroduodenoscopy (N/A, 01/27/2014); Esophageal dilation (N/A, 01/27/2014); Esophagogastroduodenoscopy (N/A, 08/15/2014); and Colonoscopy (N/A, 08/17/2014).  No Known Allergies  No current facility-administered medications on file prior to encounter.   Current Outpatient Prescriptions on File Prior to Encounter  Medication Sig  . acetaminophen (TYLENOL) 500 MG tablet Take 1,000 mg by mouth every 6 (six) hours as needed for mild pain or moderate pain.  Marland Kitchen amLODipine (NORVASC) 10 MG tablet Take 10 mg by mouth at bedtime. For blood pressure   . atorvastatin (LIPITOR) 40 MG tablet Take 40 mg by mouth daily.   . B Complex-C-Folic Acid (DIALYVITE TABLET) TABS Take 1 tablet by mouth daily.    . calcium acetate  (PHOSLO) 667 MG capsule Take 1,334 mg by mouth 3 (three) times daily with meals. And 1 cap with snack  . clotrimazole (LOTRIMIN) 1 % cream APPLY TO AFFECTED AREA 2 TIMES A DAY  . colchicine 0.6 MG tablet Take 0.6 mg by mouth 3 (three) times a week. Monday, wednesday, friday  . dexlansoprazole (DEXILANT) 60 MG capsule Take 60 mg by mouth daily.  Marland Kitchen FOSRENOL 1000 MG chewable tablet Chew 1,000 mg by mouth 2 (two) times daily with a meal.   . hydrOXYzine (ATARAX/VISTARIL) 25 MG tablet TAKE 1 TABLET BY MOUTH EVERY 6 HOURS AS NEEDED FOR ITCHING  . losartan (COZAAR) 25 MG tablet Take 25 mg by mouth daily.  . metoprolol (TOPROL-XL) 200 MG 24 hr tablet Take 100 mg by mouth at bedtime.   . mirtazapine (REMERON) 7.5 MG tablet Take 7.5 mg by mouth daily.  . Nutritional Supplements (FEEDING SUPPLEMENT, NEPRO CARB STEADY,) LIQD Take 237 mLs by mouth 2 (two) times daily between meals.  Marland Kitchen omeprazole (PRILOSEC) 20 MG capsule Take 20 mg by mouth daily.    . SENSIPAR 60 MG tablet Take 60 mg by mouth daily.  Marland Kitchen triamcinolone cream (KENALOG) 0.1 % Apply 1 application topically 2 (two) times daily.   . metoprolol succinate (TOPROL-XL) 100 MG 24 hr tablet   . mupirocin ointment (BACTROBAN) 2 % Place 1 application into the nose 2 (two) times daily. (Patient not taking: Reported on 05/08/2015)  . traMADol (ULTRAM) 50 MG tablet Take 50 mg by mouth every 6 (six) hours as needed for moderate pain.   . vancomycin (VANCOCIN) 250 MG capsule Take 1 capsule (250 mg total) by mouth 4 (four) times daily. (Patient not  taking: Reported on 06/18/2015)    FAMILY HISTORY:  His has no family status information on file.   SOCIAL HISTORY: He  reports that he has never smoked. He does not have any smokeless tobacco history on file. He reports that he does not drink alcohol or use illicit drugs.  REVIEW OF SYSTEMS:   NA  SUBJECTIVE:  Post code   VITAL SIGNS: BP 61/31 mmHg  Pulse 86  Temp(Src) 98 F (36.7 C) (Oral)  Resp 0  Ht  '5\' 9"'  (1.753 m)  Wt 111 lb 5.3 oz (50.5 kg)  BMI 16.43 kg/m2  SpO2 96%  HEMODYNAMICS:    VENTILATOR SETTINGS: Vent Mode:  [-] PRVC FiO2 (%):  [50 %-100 %] 50 % Set Rate:  [24 bmp-30 bmp] 24 bmp Vt Set:  [570 mL] 570 mL PEEP:  [5 cmH20] 5 cmH20 Plateau Pressure:  [20 FKC12-75 cmH20] 21 cmH20  INTAKE / OUTPUT: I/O last 3 completed shifts: In: 1944.5 [P.O.:760; I.V.:784.5; IV Piggyback:400] Out: -152 [Stool:150]  PHYSICAL EXAMINATION: General:  Cachetic, AAM inprocess of being coded  Neuro: Unresponsive HEENT:  Pupils dilated an unresponsive Cardiovascular:  Faint Irregular heart sounds Lungs:  Coarse rhonchi bilaterally Abdomen:  No bowel sounds, firm Musculoskeletal: rt arm amputation, left forearm with av graft +bruit and trill Skin: cool and dry  LABS:  BMET  Recent Labs Lab 07/02/15 1227 07/16/15 0714 07-16-15 0934  NA 135 145 141  K 4.5 3.7 3.2*  CL 114* 103 103  CO2 11* 23 18*  BUN 36* 23* 24*  CREATININE 6.44* 4.96* 4.76*  GLUCOSE 95 327* 296*    Electrolytes  Recent Labs Lab 07/01/15 0523 07/02/15 1227 2015/07/16 0713 07/16/2015 0714 16-Jul-2015 0934  CALCIUM 8.9 8.6*  --  7.3* 7.4*  MG  --   --  3.0*  --  2.4  PHOS 2.2* 2.3*  --   --  3.9    CBC  Recent Labs Lab 07/02/15 1227 July 16, 2015 0713 Jul 16, 2015 0934  WBC 10.9* 12.6* 2.7*  HGB 10.8* 7.9* 9.6*  HCT 31.8* 24.5* 29.0*  PLT 491* 280 297    Coag's No results for input(s): APTT, INR in the last 168 hours.  Sepsis Markers  Recent Labs Lab 16-Jul-2015 0714  LATICACIDVEN 11.4*    ABG  Recent Labs Lab 2015/07/16 0655 16-Jul-2015 0921  PHART 7.163* 7.501*  PCO2ART 50.4* 27.2*  PO2ART 422.0* 243.0*    Liver Enzymes  Recent Labs Lab 07/02/15 1227 2015/07/16 0714 07/16/15 0934  AST  --  90* 157*  ALT  --  20 32  ALKPHOS  --  104 158*  BILITOT  --  0.5 1.3*  ALBUMIN 2.0* 1.4* 1.6*    Cardiac Enzymes  Recent Labs Lab 16-Jul-2015 0713 16-Jul-2015 0934  TROPONINI 0.11* 0.17*     Glucose  Recent Labs Lab 06/29/15 0757 July 16, 2015 0610 2015/07/16 0840  GLUCAP 109* 56* 261*    Imaging Dg Chest Port 1 View  July 16, 2015  CLINICAL DATA:  80 year old male post code.  Subsequent encounter. EXAM: PORTABLE CHEST 1 VIEW COMPARISON:  06/22/2015 CT and 12/07/2014 chest x-ray. FINDINGS: Endotracheal tube tip 2 cm above the carina. There is marked amount of gas within the stomach. Per discussion with patient's nurse, this will be correlated with breath sounds to exclude the possibility of esophageal intubation. Nasogastric tube is in place per patient's nurse and appears to be in the region of the cervical esophagus and needs to be reposition. Central line is a femoral line and not  a subclavian line. Elevated left hemidiaphragm left base consolidation which may represent atelectasis or infiltrate. Right perihilar subsegmental atelectasis. Pulmonary vascular prominence most notable centrally. Calcified tortuous aorta. Mild cardiomegaly. Post right arm amputation. IMPRESSION: Endotracheal tube tip 2 cm above the carina. There is marked amount of gas within the stomach. Per discussion with patient's nurse, this will be correlated with breath sounds to exclude the possibility of esophageal intubation. Nasogastric tube is in place per patient's nurse and appears to be in the region of the cervical esophagus and needs to be reposition. Elevated left hemidiaphragm left base consolidation which may represent atelectasis or infiltrate. Right perihilar subsegmental atelectasis. Pulmonary vascular prominence most notable centrally. Calcified tortuous aorta. Mild cardiomegaly. These results were called by telephone at the time of interpretation on July 16, 2015 at 8:06 am to [facility], patient's nurse who verbally acknowledged these results. Electronically Signed   By: Genia Del M.D.   On: 07/16/15 08:10     STUDIES:    CULTURES: 3/28 bc>> 3/28 sputum>>  ANTIBIOTICS: 3/28 cipro>> 3/28  zoysn>> 3/28 vanco>>  SIGNIFICANT EVENTS: 3/28  LINES/TUBES: 3/28 rt fem a line>> 3/28 rt fem cvl>>  DISCUSSION: 80 yo with a plethora of health issues, 3/28 pea arrest x 2, total CPR time 20 minutes.  ASSESSMENT / PLAN:  PULMONARY A: VDRF 3/28 post PEA arrest Acute hypoxemic resp failure 2/2 unable to protect airway/prolonged cp arrest P:   Vent bundle  Use increased vent rate to off set acidosis CxR chest  CARDIOVASCULAR A:  PEA arrest with cpr x 2 total 20 minutes down time P:  Place aline Multiple Epi boluses. Levo drip after acidosis is corrected Amio bolus 150 mg x 1 for episodes of V tach, may need drip. Suspect when acidosis corrected this will be improved and amio not needed.. Check all electrolytes for imbalances Cards consult  RENAL A:   ESRD HD MWF Mixed acidosis  P:   May need hd cath for cvvh Renal input Bicarb drip  GASTROINTESTINAL A:   Presumed Perfed appendix P:   Currently not a surgical candidate   HEMATOLOGIC  Recent Labs  Jul 16, 2015 0713 Jul 16, 2015 0934  HGB 7.9* 9.6*    A:   Chronic anemia P:  Follow cbc  INFECTIOUS A:   Presume intraabdominal infection from possible perfed append. P:   See micro data  ENDOCRINE A:   Hypoglycemia - Resolved.  P:   SSI per algorithm & accu-checks q4hr   NEUROLOGIC A:   Post arrest, PEA(no cooling due to abd process) Anoxic encephalopathy 2/2 prolonged arrest P:   RASS goal: -1 while on vent Will need CT of head if survives    FAMILY  - Updates: Dr. Ashok Cordia met with family and DNR status established 3/28 with no escalation of care.   PCCM Attending Note(  8*am, 16-Jul-2015) by Dr. Ashok Cordia Patient seen & examined with nurse practitioner. Please refer to his note that I have reviewed. S/P PEA arrest. Continues to be in shock with severe respiratory & metabolic acidosis. Starting Levophed gtt & Bicarb gtt. Holding off on therapeutic hypothermia given unstable status. Broadening  antibiotics with Vancomycin, Zosyn, & Cipro. Patient is now DNR after my discussion with patient's wife, sons, and sisters at bedside. All understand that she is critically ill. Obtaining blood & tracheal aspirate cultures.  I have spent a total of 39 minutes of critical care time today caring for the patient, updating family at bedside including code discussion, and reviewing the patient's electronic medical  record.   PCCM attending addendum at 10:30 am, 07/26/15  I extensively discussed the plan of care with patient's wife and son. Discussed with them overall poor prognosis. Patient's blood pressure is 60 systolic on maximum levo fed. He is on bicarbonate drip. He is a DO NOT RESUSCITATE.  Family decides to just keep an doing what we are doing. No escalation of care. No more additional pressors. No hemodialysis. No chest compression.  We'll reassess in the next 12-24 hours. They will consider withdrawing care and support by tomorrow a.m. Nurse aware of family conversation.   Pt's condition  progressively deteriorated and went into sinus bradycardia then went into Cardiac arrest. Pt was pronounced by nurse at 2pm, 07-26-15.  Family to be updated. Pls provide post mortem care.    Monica Becton, MD 07-26-15, 2:28 PM [facility] Pager [phone] After 3 pm or if no answer, call [phone]

## 2015-07-07 NOTE — Progress Notes (Signed)
Patient ID: Brad [name], male   DOB: 1935-07-06, 80 y.o.   MRN: [id number]  [facility] Progress Note    Subjective:   Events of last 3 hours noted.  He has had 2 PEA arrests, prolonged CPR/ACLS, intubated, on escalating pressors and is unresponsive without sedation.   Objective:   BP 132/103 mmHg  Pulse 82  Temp(Src) 98 F (36.7 C) (Oral)  Resp 24  Ht '5\' 9"'  (1.753 m)  Wt 50.5 kg (111 lb 5.3 oz)  BMI 16.43 kg/m2  SpO2 100%  Intake/Output: I/O last 3 completed shifts: In: 1944.5 [P.O.:760; I.V.:784.5; IV Piggyback:400] Out: -152 [Stool:150]   Intake/Output this shift:    Weight change: -1.4 kg (-3 lb 1.4 oz)  Physical Exam: PVV:ZSMOLMBEM AAM intubated, unresponsive CVS: no rub Resp:occ rhonchi LJQ:GBEEF, hypoactive Ext:no edema, L forearm AVG without thrill/bruit  Labs: BMET  Recent Labs Lab 06/27/15 0713 06/28/15 0071 06/29/15 0606 06/29/15 0658 07/01/15 0523 07/02/15 1227 July 08, 2015 0714  NA 137 139 138 137 138 135 145  K 3.5 3.9 4.0 3.8 4.4 4.5 3.7  CL 101 100* 103 104 110 114* 103  CO2 '23 27 25 24 ' 18* 11* 23  GLUCOSE 117* 95 110* 110* 85 95 327*  BUN 23* 10 23* 23* 21* 36* 23*  CREATININE 5.77* 3.95* 5.64* 5.66* 4.90* 6.44* 4.96*  ALBUMIN 1.8* 2.0* 2.0* 1.9* 2.0* 2.0* 1.4*  CALCIUM 8.6* 8.7* 9.1 8.9 8.9 8.6* 7.3*  PHOS 3.3 2.6 2.5 2.5 2.2* 2.3*  --    CBC  Recent Labs Lab 07/01/15 0523 07/02/15 0754 07/02/15 1227 2015-07-08 0713  WBC 13.5* 11.1* 10.9* 12.6*  NEUTROABS  --  8.0*  --   --   HGB 11.5* 11.1* 10.8* 7.9*  HCT 34.0* 33.8* 31.8* 24.5*  MCV 84.2 84.5 84.8 86.6  PLT 462* 523* 491* 280    '@IMGRELPRIORS' @ Medications:    . sodium chloride   Intravenous Once  . sodium chloride   Intravenous Once  . amiodarone      . antiseptic oral rinse  7 mL Mouth Rinse q12n4p  . antiseptic oral rinse  7 mL Mouth Rinse QID  . chlorhexidine gluconate (SAGE KIT)  15 mL Mouth Rinse BID  . ciprofloxacin  400 mg Intravenous Q24H   . darbepoetin (ARANESP) injection - DIALYSIS  100 mcg Intravenous Q Fri-HD  . dextrose      . feeding supplement (NEPRO CARB STEADY)  237 mL Oral BID BM  . feeding supplement (PRO-STAT SUGAR FREE 64)  30 mL Oral BID  . heparin  5,000 Units Subcutaneous 3 times per day  . naloxone      . pantoprazole (PROTONIX) IV  40 mg Intravenous Daily  . piperacillin-tazobactam (ZOSYN)  IV  2.25 g Intravenous 3 times per day  . sodium bicarbonate      . vancomycin  1,250 mg Intravenous Once  . [START ON 07/04/2015] vancomycin  500 mg Intravenous Q M,W,F-HD     Assessment/ Plan:   1. PEA arrest- s/p CPR/intubation and stabilized, now in ICU, however unresponsive and requiring large doses of pressors.  Discussed grim prognosis with family.  Agree with DNR but would likely transition to comfort care if no significant neurologic recovery.   2. Ruptured appendix with ileus- had been treated conservatively initially with some improvement, however now with cardiac arrest 3. Metabolic acidosis- likely due to worsening of ruptured appendix and now worsening sepsis.  Too unstable for surgery.  4. ESRD HD qMWF, his AVG  has clotted due to hypotension/CPR.  Would not offer CVVHD and as above recommend comfort care. 5. Anemia:on ESA 6. CKD-MBD:stable 7. Nutrition: malnutrition with ruptured appendix and ileus, per primary 8. Vascular access- clotted L FA AVG.  Too unstable for IHD. Would not recommend CVVHD as that would be an escalation of care and recommend CMO.  Donetta Potts, MD [place] Pager [phone] July 21, 2015, 7:52 AM

## 2015-07-07 NOTE — Procedures (Signed)
Central Venous Catheter Insertion Procedure Note Brad [name] [id number] 04/22/1935  Procedure: Insertion of Central Venous Catheter Indications: Assessment of intravascular volume, Drug and/or fluid administration and Frequent blood sampling  Procedure Details Consent: Unable to obtain consent because of emergent medical necessity. Time Out: Verified patient identification, verified procedure, site/side was marked, verified correct patient position, special equipment/implants available, medications/allergies/relevent history reviewed, required imaging and test results available.  Performed  Maximum sterile technique was used including antiseptics, cap, gloves, gown, hand hygiene, mask and sheet. Skin prep: Chlorhexidine; local anesthetic administered A antimicrobial bonded/coated triple lumen catheter was placed in the right femoral vein due to emergent situation using the Seldinger technique. Ultrasound guidance used.Yes.   Catheter placed to 20 cm. Blood aspirated via all 3 ports and then flushed x 3. Line sutured x 2 and dressing applied.  Evaluation Blood flow good Complications: No apparent complications Patient did tolerate procedure well. Chest X-ray ordered to verify placement.  CXR: not needed  [facility] Sahira Cataldi ACNP Maryanna Shape PCCM Pager [phone] till 3 pm If no answer page [phone] 28-Jul-2015, 7:20 AM

## 2015-07-07 NOTE — Progress Notes (Signed)
[facility] Progress Note    Subjective: Events of this morning noted.  I do not see any documentation of severe abdominal pain prior to respiratory arrest.  I do not believe that free rupture of his appendix was the cause of his PEA arrest today.  Possibly PE or MI  Objective: Vital signs in last 24 hours: Temp:  [97.5 F (36.4 C)-98.3 F (36.8 C)] 98 F (36.7 C) (03/28 0510) Pulse Rate:  [82-128] 82 (03/28 0630) Resp:  [18-24] 24 (03/28 0630) BP: (79-132)/(53-103) 132/103 mmHg (03/28 0630) SpO2:  [92 %-100 %] 100 % (03/28 0630) FiO2 (%):  [100 %] 100 % (03/28 0630) Weight:  [50 kg (110 lb 3.7 oz)-50.5 kg (111 lb 5.3 oz)] 50.5 kg (111 lb 5.3 oz) (03/27 2034) Last BM Date: 07/02/15  Intake/Output from previous day: 03/27 0701 - 03/28 0700 In: 1410 [P.O.:720; I.V.:690] Out: -302  Intake/Output this shift:    General: Intubated on the ventilator, neurologically not responding.  Agonal breathin.  SBP in the 50's.  Patient has been  Made a DNR  Lungs: Clear  Abd: distended, not rigid, no apparent painful response to palpation.  Extremities: No changes  Neuro: Prbably anoxic brain injury  Lab Results:  @LABLAST2 (wbc:2,hgb:2,hct:2,plt:2) BMET ) Recent Labs  07/02/15 1227 07-25-15 0714  NA 135 145  K 4.5 3.7  CL 114* 103  CO2 11* 23  GLUCOSE 95 327*  BUN 36* 23*  CREATININE 6.44* 4.96*  CALCIUM 8.6* 7.3*   PT/INR No results for input(s): LABPROT, INR in the last 72 hours. ABG  Recent Labs  07-25-2015 0655 25-Jul-2015 0921  PHART 7.163* 7.501*  HCO3 18.1* 21.3    Studies/Results: Dg Chest Port 1 View  07/25/2015  CLINICAL DATA:  80 year old male post code.  Subsequent encounter. EXAM: PORTABLE CHEST 1 VIEW COMPARISON:  06/22/2015 CT and 12/07/2014 chest x-ray. FINDINGS: Endotracheal tube tip 2 cm above the carina. There is marked amount of gas within the stomach. Per discussion with patient's nurse, this will be correlated with breath sounds to exclude the  possibility of esophageal intubation. Nasogastric tube is in place per patient's nurse and appears to be in the region of the cervical esophagus and needs to be reposition. Central line is a femoral line and not a subclavian line. Elevated left hemidiaphragm left base consolidation which may represent atelectasis or infiltrate. Right perihilar subsegmental atelectasis. Pulmonary vascular prominence most notable centrally. Calcified tortuous aorta. Mild cardiomegaly. Post right arm amputation. IMPRESSION: Endotracheal tube tip 2 cm above the carina. There is marked amount of gas within the stomach. Per discussion with patient's nurse, this will be correlated with breath sounds to exclude the possibility of esophageal intubation. Nasogastric tube is in place per patient's nurse and appears to be in the region of the cervical esophagus and needs to be reposition. Elevated left hemidiaphragm left base consolidation which may represent atelectasis or infiltrate. Right perihilar subsegmental atelectasis. Pulmonary vascular prominence most notable centrally. Calcified tortuous aorta. Mild cardiomegaly. These results were called by telephone at the time of interpretation on 2015/07/25 at 8:06 am to [facility], patient's nurse who verbally acknowledged these results. Electronically Signed   By: Genia Del M.D.   On: 07-25-2015 08:10    Anti-infectives: Anti-infectives    Start     Dose/Rate Route Frequency Ordered Stop   07/04/15 1200  vancomycin (VANCOCIN) 500 mg in sodium chloride 0.9 % 100 mL IVPB     500 mg 100 mL/hr over 60 Minutes Intravenous Every M-W-F (Hemodialysis)  07/16/2015 0727     07/16/2015 0800  ciprofloxacin (CIPRO) tablet 500 mg  Status:  Discontinued     500 mg Oral Daily with breakfast 07/02/15 0948 Jul 16, 2015 0719   2015/07/16 0800  ciprofloxacin (CIPRO) IVPB 400 mg     400 mg 200 mL/hr over 60 Minutes Intravenous Every 24 hours 07/16/15 0727     16-Jul-2015 0730  vancomycin (VANCOCIN) 1,250 mg in sodium  chloride 0.9 % 250 mL IVPB     1,250 mg 166.7 mL/hr over 90 Minutes Intravenous  Once 07-16-15 0727     Jul 16, 2015 0730  piperacillin-tazobactam (ZOSYN) IVPB 2.25 g     2.25 g 100 mL/hr over 30 Minutes Intravenous 3 times per day 07/16/2015 0727     07/02/15 1400  metroNIDAZOLE (FLAGYL) tablet 500 mg  Status:  Discontinued     500 mg Oral 3 times per day 07/02/15 0948 07/16/15 0719   06/20/15 2230  ciprofloxacin (CIPRO) IVPB 400 mg  Status:  Discontinued     400 mg 200 mL/hr over 60 Minutes Intravenous Every 24 hours 06/25/2015 2244 07/02/15 0948   06/20/15 0500  metroNIDAZOLE (FLAGYL) IVPB 500 mg  Status:  Discontinued     500 mg 100 mL/hr over 60 Minutes Intravenous Every 8 hours 06/12/2015 2208 07/02/15 0948   06/29/2015 2100  ciprofloxacin (CIPRO) IVPB 400 mg     400 mg 200 mL/hr over 60 Minutes Intravenous  Once 07/02/2015 2047 06/18/2015 2207   07/06/2015 2100  metroNIDAZOLE (FLAGYL) IVPB 500 mg     500 mg 100 mL/hr over 60 Minutes Intravenous  Once 06/09/2015 2047 06/27/2015 2207      Assessment/Plan: s/p  Patient is now a DNR.  No surgical needs.    I do not believe his arrest was secondary to free rupture of his appendix.  He had been assessed by our team earlier yesterday and was improving on IV antibiotics.  Diet had been started.    LOS: 14 days   Kathryne Eriksson. Dahlia Bailiff, MD, FACS [phone] [facility] Surgery 07/16/15

## 2015-07-07 NOTE — Procedures (Signed)
Arterial Catheter Insertion Procedure Note ENGEL PETROSSIAN [id number] 1935-07-13  Procedure: Insertion of Arterial Catheter  Indications: Blood pressure monitoring and Frequent blood sampling  Procedure Details Consent: Unable to obtain consent because of emergent medical necessity. Time Out: Verified patient identification, verified procedure, site/side was marked, verified correct patient position, special equipment/implants available, medications/allergies/relevent history reviewed, required imaging and test results available.  Performed  Maximum sterile technique was used including antiseptics, cap, gloves, gown, hand hygiene, mask and sheet. Skin prep: Chlorhexidine; local anesthetic administered 20 gauge catheter was inserted into right femoral artery using the Seldinger technique. US guidance Evaluation Blood flow good; BP tracing good. Complications: No apparent complications.   Richardson Landry Minor ACNP [facility] P[facility] Pager [phone] till 3 pm If no answer page [phone] 07-16-15, 7:21 AM

## 2015-07-07 NOTE — Progress Notes (Addendum)
I extensively discussed the plan of care with patient's wife and son. Discussed with them overall poor prognosis. Patient's blood pressure is 60 systolic on maximum levo fed. He is on bicarbonate drip. He is a DO NOT RESUSCITATE.  Family decides to just keep an doing what we are doing. No escalation of care. No more additional pressors. No hemodialysis. No chest compression.  We'll reassess in the next 12-24 hours. They will consider withdrawing care and support by tomorrow a.m. Nurse aware of family conversation.   Monica Becton, MD July 18, 2015, 10:24 AM [facility] Pager [phone] After 3 pm or if no answer, call [phone]

## 2015-07-07 DEATH — deceased

## 2015-07-08 LAB — CULTURE, BLOOD (ROUTINE X 2)
CULTURE: NO GROWTH
Culture: NO GROWTH

## 2016-01-20 ENCOUNTER — Encounter (HOSPITAL_COMMUNITY): Payer: Self-pay | Admitting: Anesthesiology

## 2017-08-22 IMAGING — MR MR LUMBAR SPINE W/O CM
5 series · 43 of 48 positions shown · non-contrast
Comparison: Right hip MRI 06/08/2013. Right hip radiographs
06/04/2013. No prior lumbar spine imaging.

CLINICAL DATA: Low back pain with left hip pain, worsening for 3
months. Chronic renal failure on dialysis. History of gout.

EXAM:
MRI LUMBAR SPINE WITHOUT CONTRAST
TECHNIQUE: Multiplanar, multisequence MR imaging of the lumbar spine was
performed. No intravenous contrast was administered.

[Series 2: T2 post-contrast · sagittal · 4.0mm · 0.88mm/px · 6 of 12 slices shown]
[im 1/12]
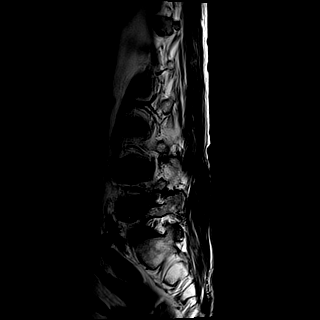
[im 3/12]
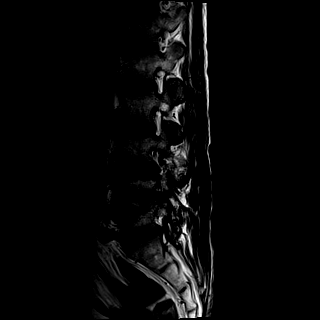
[im 5/12]
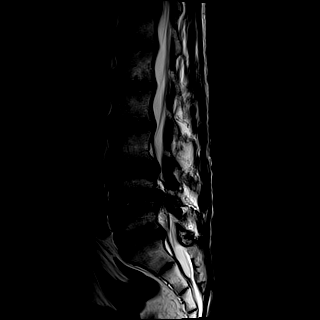
[im 7/12]
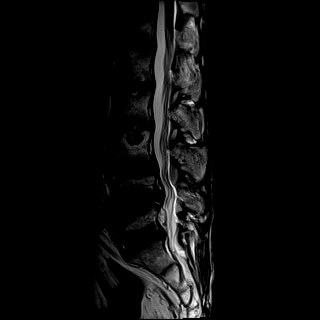
[im 9/12]
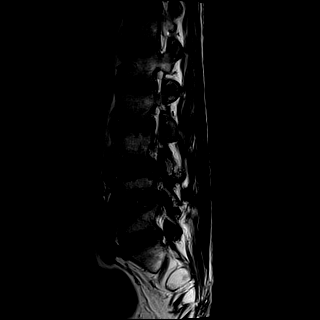
[im 12/12]
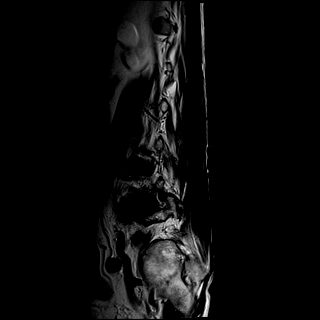

[Series 3: T1 · sagittal · 4.0mm · 0.88mm/px · 5 of 12 slices shown (1 of 2)]
[im 1/12]
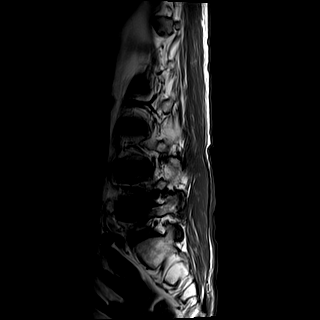
[im 3/12]
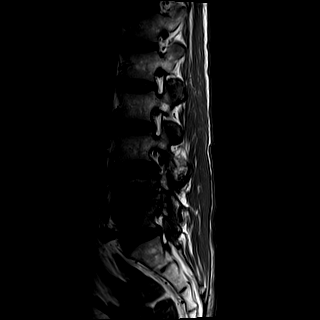
[im 6/12]
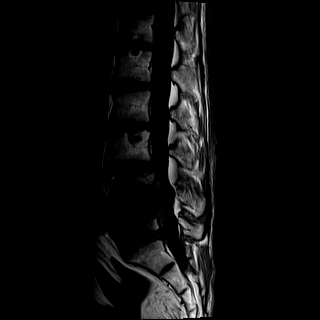
[im 9/12]
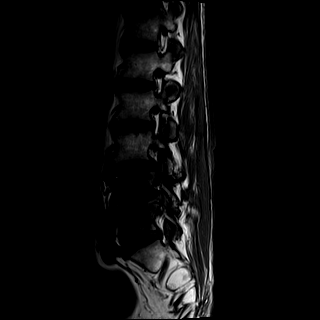
[im 12/12]
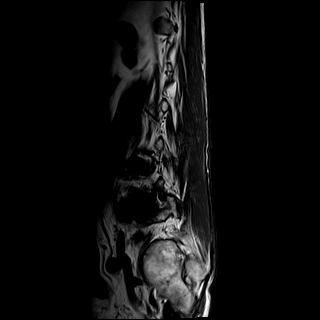

[Series 4: T1 · axial · 4.0mm · 0.74mm/px · z∈[-90,+104]mm · 11 of 35 slices shown (2 of 2)]
[im 1/35]
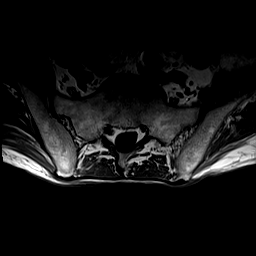
[im 3/35]
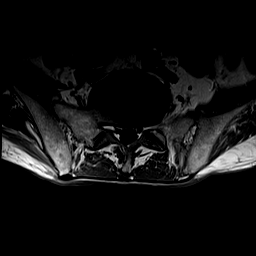
[im 5/35]
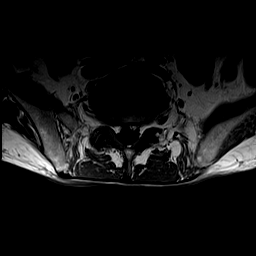
[im 7/35]
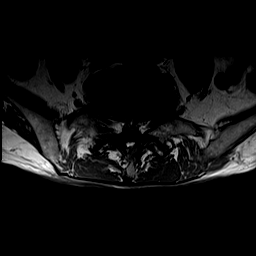
[im 12/35]
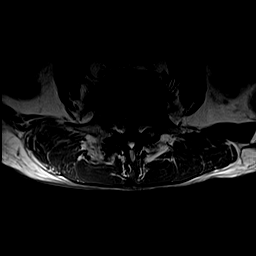
[im 16/35]
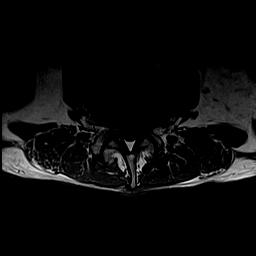
[im 19/35]
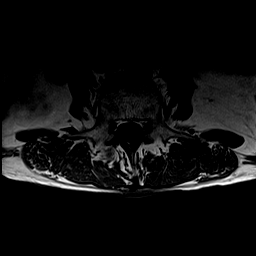
[im 21/35]
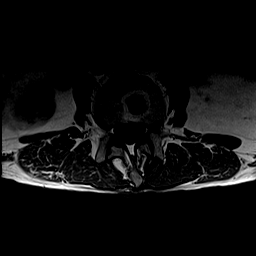
[im 25/35]
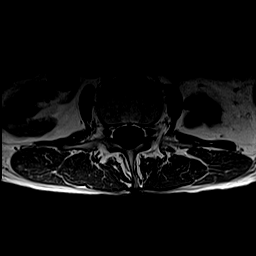
[im 30/35]
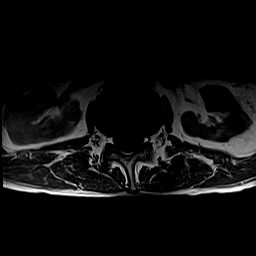
[im 35/35]
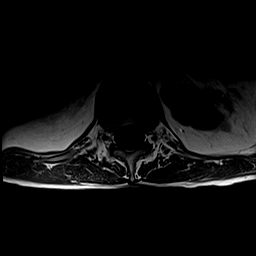

[Series 5: STIR · sagittal · 4.0mm · 0.55mm/px · 5 of 12 slices shown]
[im 1/12]
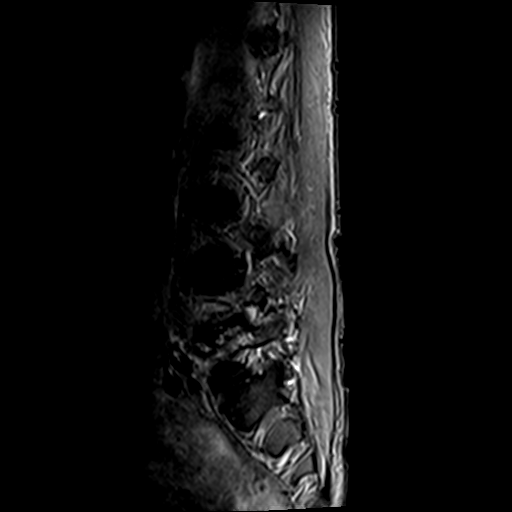
[im 3/12]
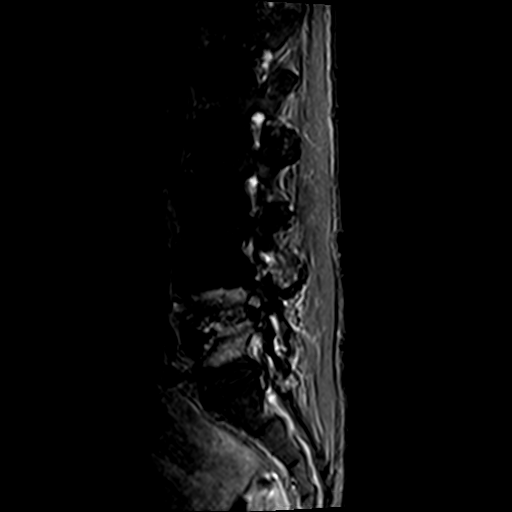
[im 6/12]
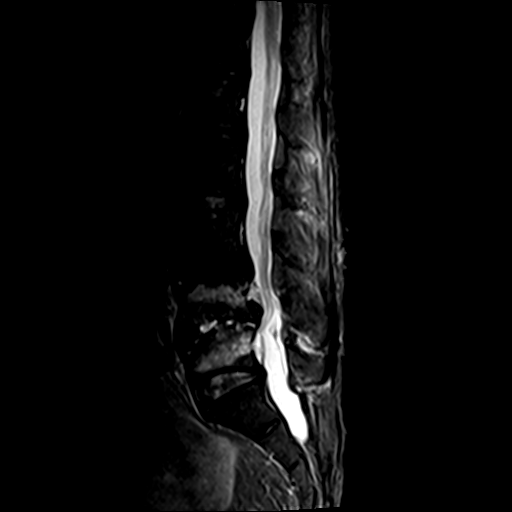
[im 9/12]
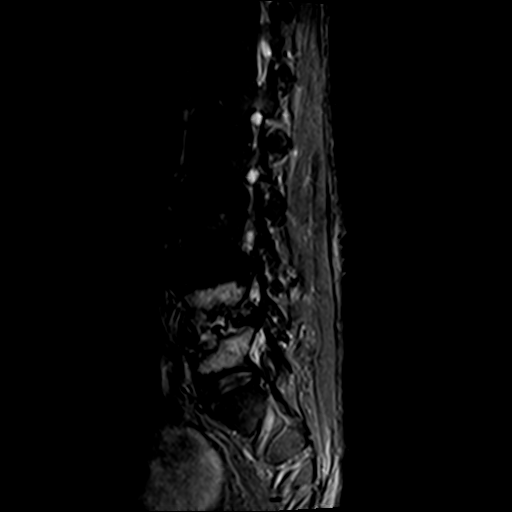
[im 12/12]
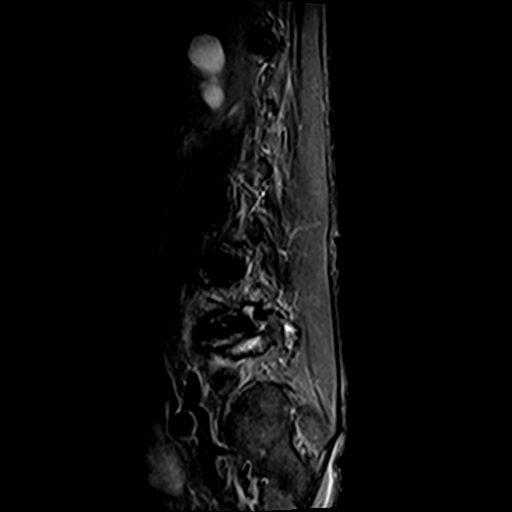

[Series 6: T2 · axial · 4.0mm · 0.74mm/px · z∈[-90,+104]mm · 16 of 35 slices shown]
[im 1/35]
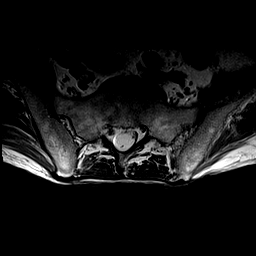
[im 3/35]
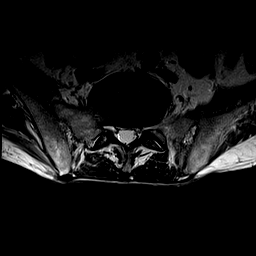
[im 5/35]
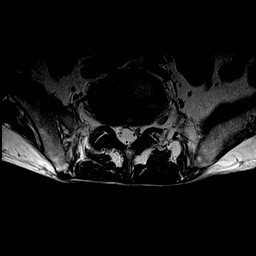
[im 7/35]
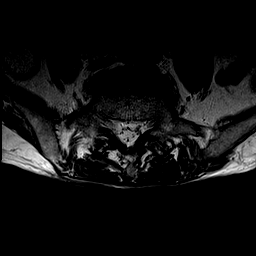
[im 10/35]
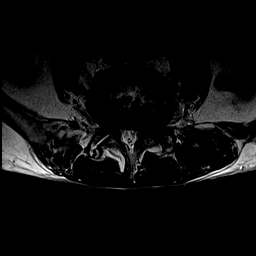
[im 12/35]
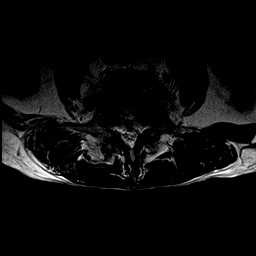
[im 14/35]
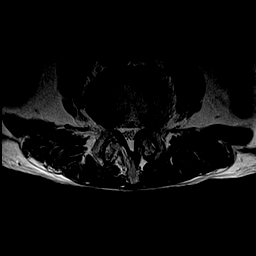
[im 16/35]
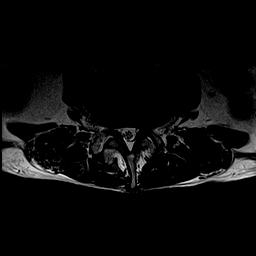
[im 19/35]
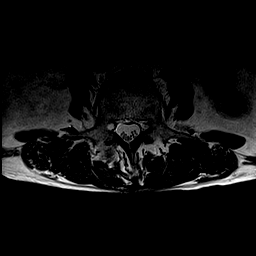
[im 21/35]
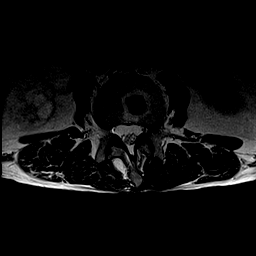
[im 23/35]
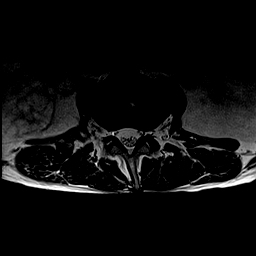
[im 25/35]
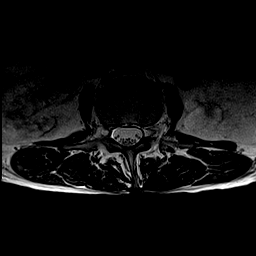
[im 28/35]
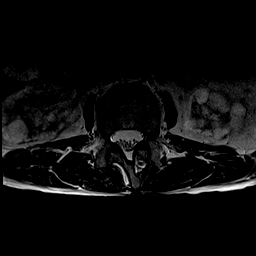
[im 30/35]
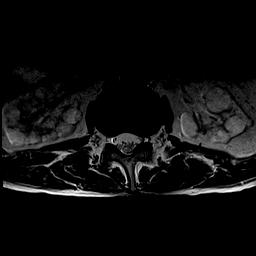
[im 32/35]
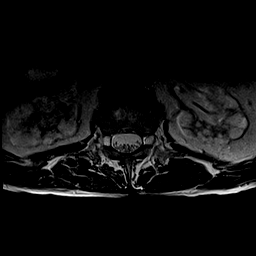
[im 35/35]
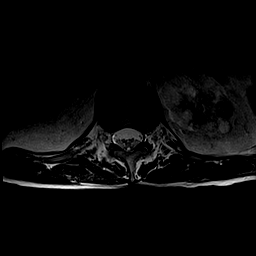

[43 of 48 positions shown; findings below may reference images not displayed]

FINDINGS: There is straightening of the normal lumbar lordosis. There is no
significant listhesis. Disc desiccation is present in the visualized
lower thoracic spine and from L1-2 to L3-4. Multiple Schmorl's nodes
are noted, including a prominent 1 involving the L3 superior
endplate. There is no evidence of compression fracture.

There is marked disc space height loss at L4-5 with abnormal STIR
hyperintensity in the disc space and erosive changes of the
endplates. Prominent marrow edema is present throughout the L4 and
L5 vertebral bodies with some extension into the pedicles. Marrow
edema was present subjacent to the L5 superior endplate on the prior
hip MRI but has progressed in the interim. Disc space collapse has
progressed since the prior hip radiographs. There is circumferential
displacement of low T2 signal material from the disc space. No
paraspinal fluid collection or epidural abscess is seen.

The conus medullaris is normal in signal and terminates at T12. The
kidneys are atrophic and contain numerous cysts, incompletely
evaluated.

T12-L1:  Minimal disc bulging without stenosis.

L1-2:  Minimal disc bulging without stenosis.

L2-3:  Mild disc bulging without stenosis.

L3-4: Mild-to-moderate circumferential disc bulging and moderate
facet hypertrophy result in mild spinal stenosis, moderate bilateral
lateral recess stenosis, and mild bilateral neural foraminal
stenosis.

L4-5: Circumferentially displaced material from the disc space,
centrally extruded disc material behind the L4 vertebral body, and
severe facet hypertrophy result in mild spinal stenosis, severe
bilateral lateral recess stenosis, and severe bilateral neural
foraminal stenosis. A small right facet joint effusion is noted.

L5-S1: Mild disc bulging and facet hypertrophy result in mild
bilateral lateral recess narrowing without spinal canal or neural
foraminal stenosis.
IMPRESSION: 1. Destructive process centered at the L4-5 disc space which has
progressed from hip imaging performed in [DATE] and is concerning
for infectious discitis/osteomyelitis. Displaced disc material
contributes to mild spinal stenosis and severe bilateral neural
foraminal and lateral recess stenosis. No evidence of epidural or
paravertebral abscess. Other potential etiologies, such as
hemodialysis spondyloarthropathy and gout, are possible but not
favored.
2. Mild-to-moderate disc and facet degeneration elsewhere as above.
These results will be called to the ordering clinician or
representative by the Radiologist Assistant, and communication
documented in the PACS or zVision Dashboard.

## 2017-08-23 IMAGING — XA IR FLUORO GUIDE NDL PLMT / BX
1 series · 11 of 11 positions shown · non-contrast
Comparison: none

[Series 1: run · 11 of 11 slices shown]
[im 1/11]
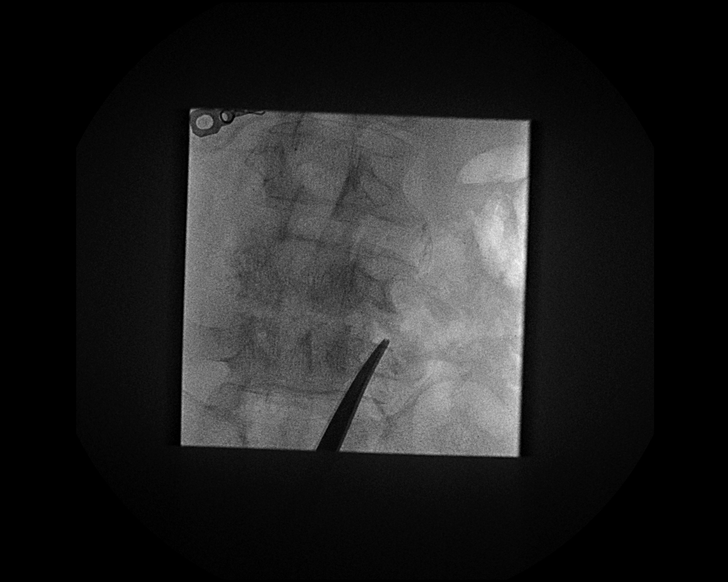
[im 2/11]
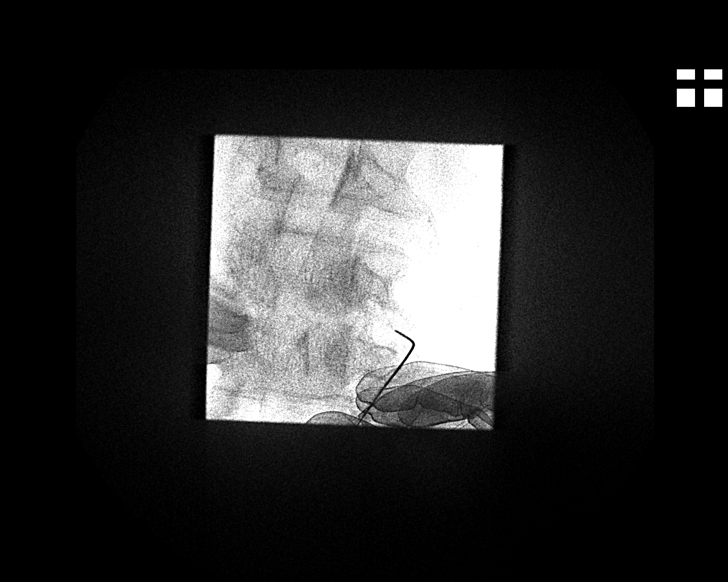
[im 3/11]
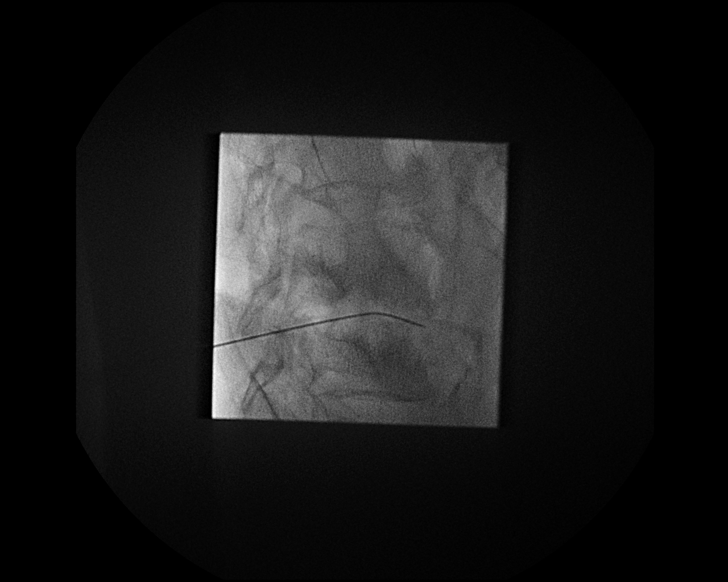
[im 4/11]
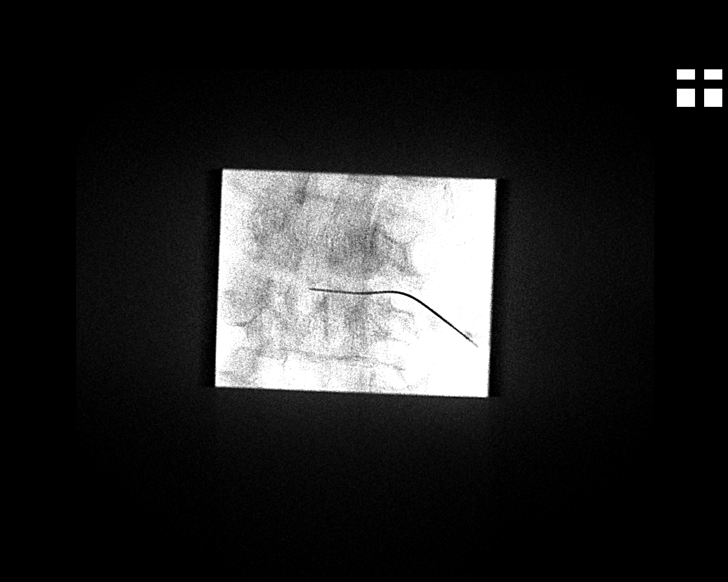
[im 5/11]
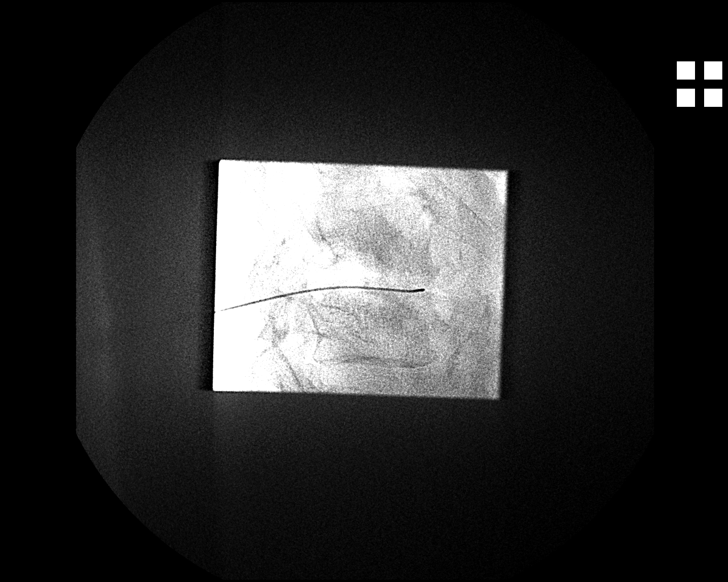
[im 6/11]
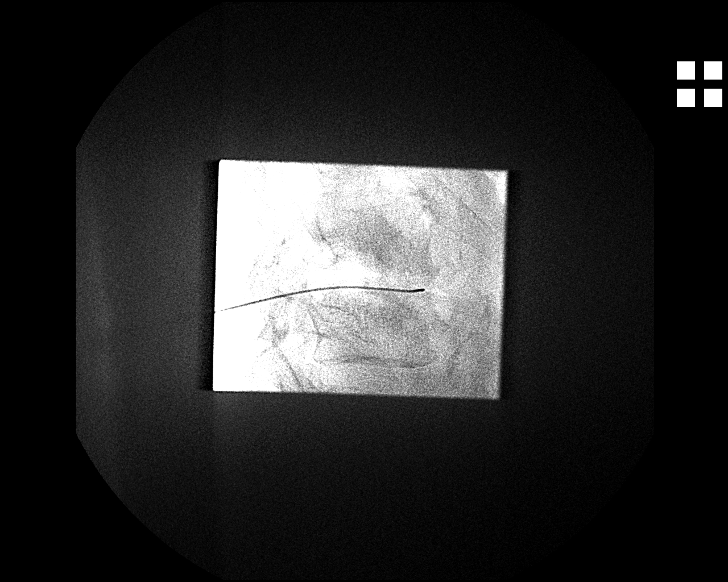
[im 7/11]
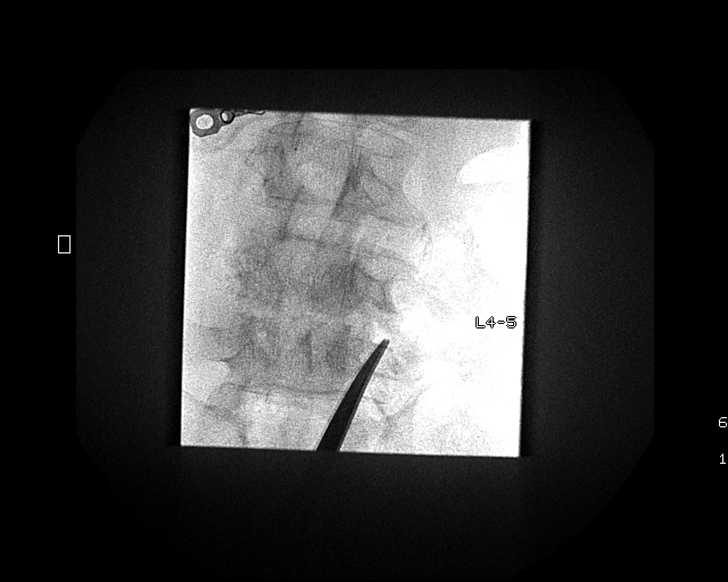
[im 8/11]
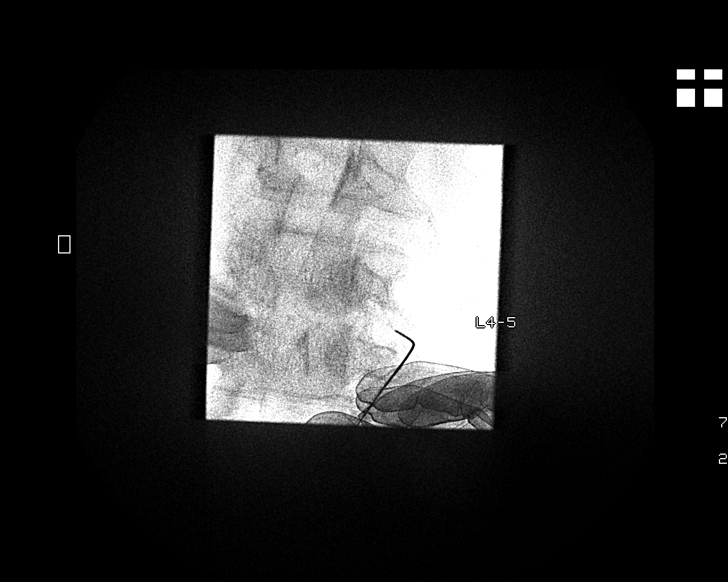
[im 9/11]
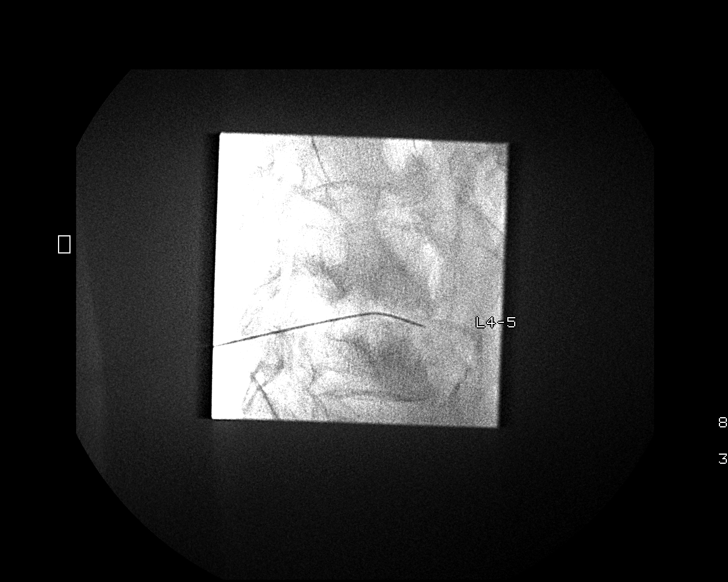
[im 10/11]
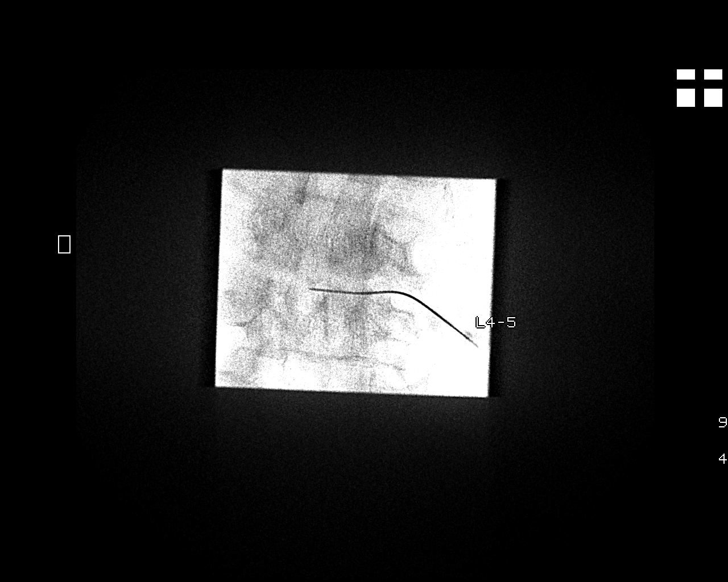
[im 11/11]
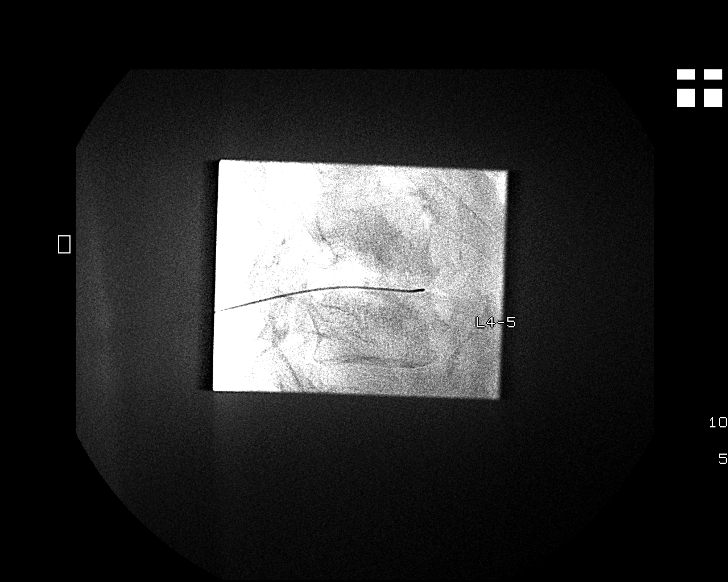

[11 of 11 positions shown; findings below may reference images not displayed]

Canned report from images found in remote index.

Refer to host system for actual result text.
